# Patient Record
Sex: Female | Born: 1940
Health system: Southern US, Community
[De-identification: ages and names within clinical notes are randomized; demographics above are authoritative.]

## PROBLEM LIST (undated history)

## (undated) DIAGNOSIS — E669 Obesity, unspecified: Secondary | ICD-10-CM

## (undated) DIAGNOSIS — M797 Fibromyalgia: Secondary | ICD-10-CM

## (undated) DIAGNOSIS — Z9109 Other allergy status, other than to drugs and biological substances: Secondary | ICD-10-CM

## (undated) DIAGNOSIS — Z9981 Dependence on supplemental oxygen: Secondary | ICD-10-CM

## (undated) DIAGNOSIS — R5383 Other fatigue: Secondary | ICD-10-CM

## (undated) DIAGNOSIS — K219 Gastro-esophageal reflux disease without esophagitis: Secondary | ICD-10-CM

## (undated) DIAGNOSIS — I10 Essential (primary) hypertension: Secondary | ICD-10-CM

## (undated) DIAGNOSIS — F419 Anxiety disorder, unspecified: Secondary | ICD-10-CM

## (undated) DIAGNOSIS — I2510 Atherosclerotic heart disease of native coronary artery without angina pectoris: Secondary | ICD-10-CM

## (undated) DIAGNOSIS — H21269 Iris atrophy (essential) (progressive), unspecified eye: Secondary | ICD-10-CM

## (undated) DIAGNOSIS — I352 Nonrheumatic aortic (valve) stenosis with insufficiency: Secondary | ICD-10-CM

## (undated) DIAGNOSIS — G4733 Obstructive sleep apnea (adult) (pediatric): Secondary | ICD-10-CM

## (undated) DIAGNOSIS — H409 Unspecified glaucoma: Secondary | ICD-10-CM

## (undated) DIAGNOSIS — D509 Iron deficiency anemia, unspecified: Secondary | ICD-10-CM

## (undated) DIAGNOSIS — M1990 Unspecified osteoarthritis, unspecified site: Secondary | ICD-10-CM

## (undated) DIAGNOSIS — I5032 Chronic diastolic (congestive) heart failure: Secondary | ICD-10-CM

## (undated) DIAGNOSIS — Z780 Asymptomatic menopausal state: Secondary | ICD-10-CM

## (undated) DIAGNOSIS — E785 Hyperlipidemia, unspecified: Secondary | ICD-10-CM

## (undated) HISTORY — DX: Chronic diastolic (congestive) heart failure: I50.32

## (undated) HISTORY — DX: Anxiety disorder, unspecified: F41.9

## (undated) HISTORY — DX: Unspecified osteoarthritis, unspecified site: M19.90

## (undated) HISTORY — DX: Obstructive sleep apnea (adult) (pediatric): G47.33

## (undated) HISTORY — DX: Iris atrophy (essential) (progressive), unspecified eye: H21.269

## (undated) HISTORY — DX: Iron deficiency anemia, unspecified: D50.9

## (undated) HISTORY — DX: Fibromyalgia: M79.7

## (undated) HISTORY — DX: Essential (primary) hypertension: I10

## (undated) HISTORY — DX: Atherosclerotic heart disease of native coronary artery without angina pectoris: I25.10

## (undated) HISTORY — DX: Asymptomatic menopausal state: Z78.0

## (undated) HISTORY — DX: Other fatigue: R53.83

## (undated) HISTORY — DX: Gastro-esophageal reflux disease without esophagitis: K21.9

## (undated) HISTORY — DX: Other allergy status, other than to drugs and biological substances: Z91.09

## (undated) HISTORY — DX: Obesity, unspecified: E66.9

## (undated) HISTORY — DX: Hyperlipidemia, unspecified: E78.5

## (undated) HISTORY — DX: Nonrheumatic aortic (valve) stenosis with insufficiency: I35.2

## (undated) HISTORY — DX: Unspecified glaucoma: H40.9

---

## 1997-11-03 ENCOUNTER — Other Ambulatory Visit: Admission: RE | Admit: 1997-11-03 | Discharge: 1997-11-03 | Payer: Self-pay | Admitting: Family Medicine

## 1999-03-22 ENCOUNTER — Other Ambulatory Visit: Admission: RE | Admit: 1999-03-22 | Discharge: 1999-03-22 | Payer: Self-pay | Admitting: Family Medicine

## 2002-04-23 ENCOUNTER — Other Ambulatory Visit: Admission: RE | Admit: 2002-04-23 | Discharge: 2002-04-23 | Payer: Self-pay | Admitting: Family Medicine

## 2003-07-21 ENCOUNTER — Other Ambulatory Visit: Admission: RE | Admit: 2003-07-21 | Discharge: 2003-07-21 | Payer: Self-pay | Admitting: Family Medicine

## 2005-02-03 ENCOUNTER — Emergency Department (HOSPITAL_COMMUNITY): Admission: EM | Admit: 2005-02-03 | Discharge: 2005-02-03 | Payer: Self-pay | Admitting: Emergency Medicine

## 2005-12-14 ENCOUNTER — Encounter: Admission: RE | Admit: 2005-12-14 | Discharge: 2005-12-14 | Payer: Self-pay | Admitting: Orthopedic Surgery

## 2006-02-06 ENCOUNTER — Other Ambulatory Visit: Admission: RE | Admit: 2006-02-06 | Discharge: 2006-02-06 | Payer: Self-pay | Admitting: Diagnostic Radiology

## 2006-02-06 ENCOUNTER — Encounter: Admission: RE | Admit: 2006-02-06 | Discharge: 2006-02-06 | Payer: Self-pay | Admitting: Surgery

## 2006-02-06 ENCOUNTER — Encounter (INDEPENDENT_AMBULATORY_CARE_PROVIDER_SITE_OTHER): Payer: Self-pay | Admitting: *Deleted

## 2006-04-08 ENCOUNTER — Other Ambulatory Visit: Admission: RE | Admit: 2006-04-08 | Discharge: 2006-04-08 | Payer: Self-pay | Admitting: Family Medicine

## 2006-04-10 ENCOUNTER — Encounter: Admission: RE | Admit: 2006-04-10 | Discharge: 2006-04-10 | Payer: Self-pay | Admitting: Family Medicine

## 2008-05-12 ENCOUNTER — Inpatient Hospital Stay (HOSPITAL_COMMUNITY): Admission: EM | Admit: 2008-05-12 | Discharge: 2008-05-15 | Payer: Self-pay | Admitting: Emergency Medicine

## 2008-05-12 ENCOUNTER — Ambulatory Visit: Payer: Self-pay | Admitting: Cardiovascular Disease

## 2008-05-12 HISTORY — PX: CARDIAC CATHETERIZATION: SHX172

## 2008-05-13 ENCOUNTER — Encounter: Payer: Self-pay | Admitting: Cardiovascular Disease

## 2008-06-09 ENCOUNTER — Encounter: Payer: Self-pay | Admitting: Cardiovascular Disease

## 2008-06-09 ENCOUNTER — Other Ambulatory Visit: Admission: RE | Admit: 2008-06-09 | Discharge: 2008-06-09 | Payer: Self-pay | Admitting: Family Medicine

## 2008-06-18 ENCOUNTER — Encounter: Admission: RE | Admit: 2008-06-18 | Discharge: 2008-06-18 | Payer: Self-pay | Admitting: Family Medicine

## 2008-07-17 ENCOUNTER — Emergency Department (HOSPITAL_COMMUNITY): Admission: EM | Admit: 2008-07-17 | Discharge: 2008-07-17 | Payer: Self-pay | Admitting: Emergency Medicine

## 2008-08-04 ENCOUNTER — Ambulatory Visit (HOSPITAL_COMMUNITY): Admission: RE | Admit: 2008-08-04 | Discharge: 2008-08-04 | Payer: Self-pay | Admitting: Orthopedic Surgery

## 2009-08-01 ENCOUNTER — Inpatient Hospital Stay (HOSPITAL_COMMUNITY): Admission: EM | Admit: 2009-08-01 | Discharge: 2009-08-03 | Payer: Self-pay | Admitting: Emergency Medicine

## 2009-08-02 HISTORY — PX: CARDIAC CATHETERIZATION: SHX172

## 2009-12-09 ENCOUNTER — Ambulatory Visit: Payer: Self-pay | Admitting: Cardiovascular Disease

## 2009-12-09 ENCOUNTER — Inpatient Hospital Stay (HOSPITAL_COMMUNITY): Admission: EM | Admit: 2009-12-09 | Discharge: 2009-12-11 | Payer: Self-pay | Admitting: Emergency Medicine

## 2010-05-31 ENCOUNTER — Ambulatory Visit: Payer: Self-pay | Admitting: Cardiovascular Disease

## 2010-06-06 ENCOUNTER — Telehealth (INDEPENDENT_AMBULATORY_CARE_PROVIDER_SITE_OTHER): Payer: Self-pay | Admitting: Radiology

## 2010-06-07 ENCOUNTER — Ambulatory Visit: Admission: RE | Admit: 2010-06-07 | Discharge: 2010-06-07 | Payer: Self-pay | Source: Home / Self Care

## 2010-06-07 ENCOUNTER — Encounter (HOSPITAL_COMMUNITY)
Admission: RE | Admit: 2010-06-07 | Discharge: 2010-06-20 | Payer: Self-pay | Source: Home / Self Care | Attending: Cardiovascular Disease | Admitting: Cardiovascular Disease

## 2010-06-07 ENCOUNTER — Encounter: Payer: Self-pay | Admitting: Internal Medicine

## 2010-06-08 ENCOUNTER — Ambulatory Visit: Admission: RE | Admit: 2010-06-08 | Discharge: 2010-06-08 | Payer: Self-pay | Source: Home / Self Care

## 2010-06-21 ENCOUNTER — Ambulatory Visit (INDEPENDENT_AMBULATORY_CARE_PROVIDER_SITE_OTHER): Payer: Medicare Other | Admitting: Cardiovascular Disease

## 2010-06-21 DIAGNOSIS — I252 Old myocardial infarction: Secondary | ICD-10-CM

## 2010-06-21 DIAGNOSIS — I2510 Atherosclerotic heart disease of native coronary artery without angina pectoris: Secondary | ICD-10-CM

## 2010-06-21 DIAGNOSIS — I119 Hypertensive heart disease without heart failure: Secondary | ICD-10-CM

## 2010-06-21 DIAGNOSIS — Z9861 Coronary angioplasty status: Secondary | ICD-10-CM

## 2010-06-22 NOTE — Progress Notes (Signed)
Summary: nuc pre-procedure  Phone Note Outgoing Call   Call placed by: Domenic Polite, CNMT,  June 06, 2010 3:09 PM Call placed to: Patient Reason for Call: Confirm/change Appt Summary of Call: Left message with information on Myoview Information Sheet (see scanned document for details).      Nuclear Med Background Indications for Stress Test: Evaluation for Ischemia, Stent Patency   History: Echo, Heart Catheterization, Myocardial Infarction, Myocardial Perfusion Study, Stents  History Comments: '09 MI -Inf. / Stent  RCA; 7/10 MPS-nml.07/23/09 Cath patent stent-nml. LV FX.; 6/11 Echo -nml. LV FX  Symptoms: Chest Pressure, DOE, Fatigue    Nuclear Pre-Procedure Cardiac Risk Factors: Family History - CAD, History of Smoking, Hypertension, Lipids

## 2010-06-22 NOTE — Assessment & Plan Note (Signed)
Summary: Cardiology Nuclear Testing  Nuclear Med Background Indications for Stress Test: Evaluation for Ischemia, Stent Patency   History: Echo, Heart Catheterization, Myocardial Infarction, Myocardial Perfusion Study, Stents  History Comments: '09 IWMI>Stent-RCA; '10 ZOX:WRUEAV; 07/23/09 Cath:patent stent; 6/11 Echo:normal LVF  Symptoms: Chest Pain, DOE, Fatigue, Rapid HR  Symptoms Comments: CP>neck. Last episode of WU:JWJXBJYN, 06/03/10.   Nuclear Pre-Procedure Cardiac Risk Factors: Family History - CAD, History of Smoking, Hypertension, Lipids, Obesity Caffeine/Decaff Intake: None NPO After: 10:00 PM Lungs: Clear.  O2 Sat 97% on RA. IV 0.9% NS with Angio Cath: 20g     IV Site: R Antecubital IV Started by: Stanton Kidney, EMT-P Chest Size (in) 40     Cup Size D     Height (in): 66.5 Weight (lb): 227 BMI: 36.22 Tech Comments: Carvedilol held >12 hours, per patient.  Nuclear Med Study 1 or 2 day study:  2 day     Stress Test Type:  Eugenie Birks Reading MD:  Marca Ancona, MD     Referring MD:  Kristeen Miss, MD Resting Radionuclide:  Technetium 69m Tetrofosmin     Resting Radionuclide Dose:  33 mCi  Stress Radionuclide:  Technetium 65m Tetrofosmin     Stress Radionuclide Dose:  33 mCi   Stress Protocol   Lexiscan: 0.4 mg   Stress Test Technologist:  [employer], CMA-N     Nuclear Technologist:  Domenic Polite, CNMT  Rest Procedure  Myocardial perfusion imaging was performed at rest 45 minutes following the intravenous administration of Technetium 40m Tetrofosmin.  Stress Procedure  The patient received IV Lexiscan 0.4 mg over 15-seconds.  Technetium 72m Tetrofosmin injected at 30-seconds.  There were no significant changes with infusion, only occasional PVC's.  Quantitative spect images were obtained after a 45 minute delay.  QPS Raw Data Images:  Soft tissue (diaphragm, breast) surround heart. Stress Images:  Small region of apical thinning.  Otherwise normal  perfusion. Rest Images:  Normalization in apex. Subtraction (SDS):  No significant ischemia. Transient Ischemic Dilatation:  1.03  (Normal <1.22)  Lung/Heart Ratio:  0.36  (Normal <0.45)  Quantitative Gated Spect Images QGS EDV:  65 ml QGS ESV:  17 ml QGS EF:  73 %   Overall Impression  Exercise Capacity: Lexiscan with no exercise. BP Response: Normal blood pressure response. Clinical Symptoms: No chest pain ECG Impression: No significant ST segment change suggestive of ischemia. Overall Impression Comments: Probable normal perfusion and minimal apical thinning.  Appended Document: Cardiology Nuclear Testing copy sent to DR.Nasher

## 2010-06-23 ENCOUNTER — Inpatient Hospital Stay (HOSPITAL_BASED_OUTPATIENT_CLINIC_OR_DEPARTMENT_OTHER)
Admission: RE | Admit: 2010-06-23 | Discharge: 2010-06-23 | Disposition: A | Discharge status: Home / Self Care | Payer: Medicare Other | Source: Ambulatory Visit | Attending: Cardiovascular Disease | Admitting: Cardiovascular Disease

## 2010-06-23 DIAGNOSIS — Z9861 Coronary angioplasty status: Secondary | ICD-10-CM | POA: Insufficient documentation

## 2010-06-23 DIAGNOSIS — I2510 Atherosclerotic heart disease of native coronary artery without angina pectoris: Secondary | ICD-10-CM | POA: Insufficient documentation

## 2010-06-26 ENCOUNTER — Ambulatory Visit: Payer: Self-pay | Admitting: Cardiovascular Disease

## 2010-06-29 HISTORY — PX: CARDIAC CATHETERIZATION: SHX172

## 2010-07-02 NOTE — H&P (Signed)
NAMELAURIA, DEPOY NO.:  [id number]  MEDICAL RECORD NO.:  [id number]            PATIENT TYPE:  LOCATION:                                 FACILITY:  PHYSICIAN:  Vesta Mixer, M.D.      DATE OF BIRTH:  DATE OF ADMISSION: DATE OF DISCHARGE:                             HISTORY & PHYSICAL   [name] is a middle-aged female with a history of coronary artery disease, hypertension, hyperlipidemia.  She is admitted to the hospital for cardiac catheterization after presenting with some episodes of chest pain and having an abnormal stress Myoview study.  The patient presented to the office several weeks ago with some episodes of chest discomfort.  These episodes of chest discomfort were described as some fatigue and shortness of breath with exertion.  She has also had some palpitations.  She has had a pressure-like sensation that is relieved with sublingual nitroglycerin.  We sent her over for a stress Myoview study.  She was found to have a possible apical ischemia.  I cannot completely rule out breast attenuation, but it does appear to be a reversible defect.  Left ventricular systolic function was normal with an ejection fraction of 73%.  There are no segmental wall motion abnormalities.  The patient is now admitted for cardiac catheterization.  Current medications include: 1. Tramadol 50 mg three times a day. 2. Amlodipine 5 mg a day. 3. Plavix 75 mg a day. 4. Carvedilol 12.5 mg twice a day. 5. Effexor 75 mg a day. 6. Lisinopril 10 mg a day. 7. Aspirin 325 mg a day. 8. Mirapex once a day. 9. Crestor 10 mg a day. 10.Pramipexole 0.25 mg at night. 11.Hydrochlorothiazide 25 mg a day. 12.Iron tablets once a day. 13.Amitriptyline once a night.  ALLERGIES:  No known drug allergies.  PAST MEDICAL HISTORY: 1. Coronary artery disease - she is status post PTCA and stenting of     the distal right coronary artery using a 2.5- x 18-mm Xience stent  postdilated with 3-mm noncompliant balloon 2. Hypertension. 3. Hyperlipidemia. 4. Fatigue.  SOCIAL HISTORY:  The patient has a history of smoking in the remote past.  She quit in 1965.  FAMILY HISTORY:  Positive for hypertension, heart disease, and arthritis.  REVIEW OF SYSTEMS:  The review of systems as noted in the HPI.  All other systems are negative.  PHYSICAL EXAMINATION:  GENERAL:  She is alert and oriented x3, and her mood and affect are normal. VITAL SIGNS:  Her weight is 230 pounds, blood pressure is 118/60 with a heart rate of 62. HEENT:  Carotids 2+.  She has no bruits, no JVD, no thyromegaly. LUNGS:  Clear to auscultation. HEART:  Regular rate S1 and S2. ABDOMEN:  Good bowel sounds and is nontender. EXTREMITIES:  She has no clubbing, cyanosis, or edema. NEUROLOGIC:  Nonfocal.  Her EKG reveals normal sinus rhythm.  She has no ST or T-wave changes.  She presents with symptoms that are worrisome for angina.  Her stress Myoview study shows possible reversible anteroapical ischemia.  I have recommended that we proceed with  cardiac catheterization.  We have discussed the risks, benefits, and options of the cardiac catheterization.  She understands and agrees to proceed.  All of her other medical problems are stable.     Vesta Mixer, M.D.     PJN/MEDQ  D:  06/21/2010  T:  06/22/2010  Job:  [id number]  cc:   L. Lupe Carney, M.D.  Electronically Signed by Kristeen Miss M.D. on 06/30/2010 04:51:02 PM

## 2010-07-02 NOTE — Procedures (Signed)
NAMEYOUNIQUE, CASAD NO.:  [id number]  MEDICAL RECORD NO.:  [id number]          PATIENT TYPE:  LOCATION:                                 FACILITY:  PHYSICIAN:  Vesta Mixer, M.D.      DATE OF BIRTH:  DATE OF PROCEDURE:  06/23/2010 DATE OF DISCHARGE:                           CARDIAC CATHETERIZATION   [name] is a 70 year old female with a history of coronary artery disease.  She is status post PTCA and stenting of her distal right coronary artery several years ago.  She presents with episodes of chest discomfort and shortness of breath with exertion.  She had a stress Myoview study which revealed apical ischemia.  We were not able to rule out breast attenuation.  The patient is getting in for heart catheterization and for further evaluation.  PROCEDURE:  Left heart catheterization with coronary angiography.  Right femoral artery was easily cannulated using modified Seldinger technique.  HEMODYNAMIC RESULTS:  LV pressure is 116/11.  The aortic pressure is 112/40.  Angiography:  Left main:  The left main is relatively short, but is otherwise normal.  The left main and LAD have mild to moderate calcifications.  Overall, the coronary arteries are quite small.  The left anterior descending artery has a proximal narrowing of approximately 40-50%.  It is probably between 1.3 and 2 mm in diameter. There is no discrete stenosis.  This is followed by an eccentric 30-40% stenosis in the proximal LAD. The first diagonal branch is a moderate-sized branch.  There are minor luminal irregularities in the first diagonal artery.  There are no discrete stenoses.  The mid and distal LAD are somewhat tortuous, but otherwise free of disease.  The left circumflex artery has mild to moderate irregularities.  It gives off primarily a first obtuse marginal artery.  This branch is tortuous, but otherwise is free of disease.  The right coronary artery is large and  dominant.  There are mild to moderate irregularities.  The mid RCA has a long diffuse 50% stenosis. There is then a 30% stenosis prior to the bifurcation.  The stent located in the distal RCA is wide open.  There is no evidence of in- stent restenosis.  The posterior descending artery and posterolateral segment artery are normal.  The left ventriculogram was performed in a 30 RAO position.  It reveals normal left ventricular systolic function.  The ejection fraction is 65%.  There is no significant mitral regurgitation.  COMPLICATIONS:  None.  CONCLUSION:  Mild to moderate coronary artery irregularities.  Her proximal left anterior descending artery is moderately narrowed.  She also has an eccentric stenosis in the proximal LAD.  These do not appear to obstruct flow at present, but they are fairly small vessels.  They appeared to be between 1.5 and 2 mm in diameter based on the fact that we are using 4-French catheters.  We will continue to treat her medically.  She needs to lose weight.  We will maximize her medical therapy.  Of note, the patient did have some apneic episodes.  She quite likely has sleep apnea and  will need to evaluate this in future.     Vesta Mixer, M.D.     PJN/MEDQ  D:  06/23/2010  T:  06/24/2010  Job:  [id number]  Electronically Signed by Kristeen Miss M.D. on 06/30/2010 04:50:59 PM

## 2010-07-10 ENCOUNTER — Ambulatory Visit: Payer: Medicare Other | Admitting: Cardiovascular Disease

## 2010-08-05 LAB — CARDIAC PANEL(CRET KIN+CKTOT+MB+TROPI)
CK, MB: 1.4 ng/mL (ref 0.3–4.0)
Relative Index: INVALID (ref 0.0–2.5)
Total CK: 68 U/L (ref 7–177)
Troponin I: 0.03 ng/mL (ref 0.00–0.06)

## 2010-08-05 LAB — URINALYSIS, ROUTINE W REFLEX MICROSCOPIC
Glucose, UA: NEGATIVE mg/dL
Nitrite: NEGATIVE
Protein, ur: NEGATIVE mg/dL
Specific Gravity, Urine: 1.015 (ref 1.005–1.030)
Urobilinogen, UA: 0.2 mg/dL (ref 0.0–1.0)

## 2010-08-05 LAB — BASIC METABOLIC PANEL
BUN: 24 mg/dL — ABNORMAL HIGH (ref 6–23)
CO2: 27 mEq/L (ref 19–32)
GFR calc non Af Amer: 55 mL/min — ABNORMAL LOW (ref >60)
Potassium: 4.2 mEq/L (ref 3.5–5.1)

## 2010-08-05 LAB — CBC
Hemoglobin: 11.5 g/dL — ABNORMAL LOW (ref 12.0–15.0)
Platelets: 201 10*3/uL (ref 150–400)
RBC: 3.91 MIL/uL (ref 3.87–5.11)
RDW: 16.2 % — ABNORMAL HIGH (ref 11.5–15.5)
WBC: 9.4 10*3/uL (ref 4.0–10.5)

## 2010-08-05 LAB — DIFFERENTIAL
Eosinophils Absolute: 0.2 10*3/uL (ref 0.0–0.7)
Monocytes Absolute: 0.7 10*3/uL (ref 0.1–1.0)
Monocytes Relative: 8 % (ref 3–12)

## 2010-08-05 LAB — APTT: aPTT: 24 seconds (ref 24–37)

## 2010-08-05 LAB — PROTIME-INR
INR: 0.94 (ref 0.00–1.49)
Prothrombin Time: 12.5 seconds (ref 11.6–15.2)

## 2010-08-05 LAB — URINE MICROSCOPIC-ADD ON

## 2010-08-05 LAB — POCT CARDIAC MARKERS
CKMB, poc: 1.1 ng/mL (ref 1.0–8.0)
Myoglobin, poc: 84.9 ng/mL (ref 12–200)
Troponin i, poc: <0.05 ng/mL (ref 0.00–0.09)

## 2010-08-05 LAB — CK TOTAL AND CKMB (NOT AT ARMC): Total CK: 68 U/L (ref 7–177)

## 2010-08-14 ENCOUNTER — Other Ambulatory Visit: Payer: Self-pay | Admitting: *Deleted

## 2010-08-14 DIAGNOSIS — I10 Essential (primary) hypertension: Secondary | ICD-10-CM

## 2010-08-14 LAB — POCT I-STAT, CHEM 8
BUN: 19 mg/dL (ref 6–23)
Chloride: 108 mEq/L (ref 96–112)
Glucose, Bld: 84 mg/dL (ref 70–99)
HCT: 31 % — ABNORMAL LOW (ref 36.0–46.0)
Potassium: 3.8 mEq/L (ref 3.5–5.1)

## 2010-08-14 LAB — COMPREHENSIVE METABOLIC PANEL
ALT: 17 U/L (ref 0–35)
AST: 25 U/L (ref 0–37)
Albumin: 3.3 g/dL — ABNORMAL LOW (ref 3.5–5.2)
Alkaline Phosphatase: 87 U/L (ref 39–117)
Chloride: 107 mEq/L (ref 96–112)
GFR calc Af Amer: >60 mL/min (ref >60)
Potassium: 4.1 mEq/L (ref 3.5–5.1)
Total Bilirubin: 0.6 mg/dL (ref 0.3–1.2)

## 2010-08-14 LAB — DIFFERENTIAL
Basophils Absolute: 0 10*3/uL (ref 0.0–0.1)
Eosinophils Relative: 2 % (ref 0–5)
Lymphocytes Relative: 32 % (ref 12–46)
Neutro Abs: 4.1 10*3/uL (ref 1.7–7.7)

## 2010-08-14 LAB — RETICULOCYTES
RBC.: 4.01 MIL/uL (ref 3.87–5.11)
Retic Ct Pct: 0.9 % (ref 0.4–3.1)

## 2010-08-14 LAB — CARDIAC PANEL(CRET KIN+CKTOT+MB+TROPI)
CK, MB: 0.8 ng/mL (ref 0.3–4.0)
CK, MB: 0.9 ng/mL (ref 0.3–4.0)
Total CK: 80 U/L (ref 7–177)

## 2010-08-14 LAB — CBC
HCT: 30.8 % — ABNORMAL LOW (ref 36.0–46.0)
MCHC: 33.5 g/dL (ref 30.0–36.0)
Platelets: 180 10*3/uL (ref 150–400)
Platelets: 210 10*3/uL (ref 150–400)
RBC: 3.74 MIL/uL — ABNORMAL LOW (ref 3.87–5.11)
RDW: 17.8 % — ABNORMAL HIGH (ref 11.5–15.5)
WBC: 7 10*3/uL (ref 4.0–10.5)

## 2010-08-14 LAB — FOLATE: Folate: >20 ng/mL

## 2010-08-14 LAB — LIPID PANEL
Cholesterol: 192 mg/dL (ref 0–200)
LDL Cholesterol: 128 mg/dL — ABNORMAL HIGH (ref 0–99)
VLDL: 18 mg/dL (ref 0–40)

## 2010-08-14 LAB — IRON AND TIBC
Saturation Ratios: 12 % — ABNORMAL LOW (ref 20–55)
UIBC: 335 ug/dL

## 2010-08-14 LAB — CK TOTAL AND CKMB (NOT AT ARMC): Total CK: 89 U/L (ref 7–177)

## 2010-08-14 LAB — POCT CARDIAC MARKERS
CKMB, poc: <1 ng/mL — ABNORMAL LOW (ref 1.0–8.0)
Troponin i, poc: <0.05 ng/mL (ref 0.00–0.09)

## 2010-08-14 LAB — VITAMIN B12: Vitamin B-12: 622 pg/mL (ref 211–911)

## 2010-08-14 LAB — PROTIME-INR: Prothrombin Time: 12.4 seconds (ref 11.6–15.2)

## 2010-08-14 LAB — APTT: aPTT: 25 seconds (ref 24–37)

## 2010-08-14 MED ORDER — AMLODIPINE BESYLATE 5 MG PO TABS: 5.0000 mg | ORAL_TABLET | Freq: Every day | ORAL | Status: DC

## 2010-08-14 NOTE — Telephone Encounter (Signed)
REFILL PER FAX FROM PHARMACY  

## 2010-08-16 ENCOUNTER — Other Ambulatory Visit: Payer: Self-pay | Admitting: *Deleted

## 2010-08-16 DIAGNOSIS — I2510 Atherosclerotic heart disease of native coronary artery without angina pectoris: Secondary | ICD-10-CM

## 2010-08-16 MED ORDER — CARVEDILOL 12.5 MG PO TABS: 12.5000 mg | ORAL_TABLET | Freq: Two times a day (BID) | ORAL | Status: DC

## 2010-08-16 NOTE — Telephone Encounter (Signed)
Refill per fax from pharmacy

## 2010-08-31 LAB — CBC
HCT: 31.7 % — ABNORMAL LOW (ref 36.0–46.0)
Hemoglobin: 10.8 g/dL — ABNORMAL LOW (ref 12.0–15.0)
MCHC: 34 g/dL (ref 30.0–36.0)
MCV: 86.2 fL (ref 78.0–100.0)
Platelets: 206 10*3/uL (ref 150–400)
RBC: 3.68 MIL/uL — ABNORMAL LOW (ref 3.87–5.11)
RDW: 14.2 % (ref 11.5–15.5)
WBC: 6.8 10*3/uL (ref 4.0–10.5)

## 2010-08-31 LAB — BASIC METABOLIC PANEL
BUN: 19 mg/dL (ref 6–23)
CO2: 29 mEq/L (ref 19–32)
Calcium: 9.9 mg/dL (ref 8.4–10.5)
Chloride: 104 mEq/L (ref 96–112)
Creatinine, Ser: 1.02 mg/dL (ref 0.4–1.2)
GFR calc Af Amer: >60 mL/min (ref >60)
GFR calc non Af Amer: 54 mL/min — ABNORMAL LOW (ref >60)
Glucose, Bld: 84 mg/dL (ref 70–99)
Potassium: 4.5 mEq/L (ref 3.5–5.1)
Sodium: 139 mEq/L (ref 135–145)

## 2010-09-13 ENCOUNTER — Telehealth: Payer: Self-pay | Admitting: Cardiovascular Disease

## 2010-09-13 NOTE — Telephone Encounter (Signed)
Please Call back

## 2010-09-13 NOTE — Telephone Encounter (Signed)
Been having fits of dizziness and extreme drops in blood pressure when she stands. She feels like she is on the verge of blacking out when she stands. Feels tingling sensations in her feet and hands. This has been getting progressively worse over the past three months. Would like to speak with someone about this. Please call back. I have pulled her file.

## 2010-09-13 NOTE — Telephone Encounter (Signed)
Been seen by pcp and ER for total of three times. bp been110/70.  Worsening near syncopal episodes. Wants to be seen, app made.Alfonso Ramus RN

## 2010-09-14 ENCOUNTER — Encounter: Payer: Self-pay | Admitting: *Deleted

## 2010-09-14 DIAGNOSIS — I252 Old myocardial infarction: Secondary | ICD-10-CM | POA: Insufficient documentation

## 2010-09-14 DIAGNOSIS — I10 Essential (primary) hypertension: Secondary | ICD-10-CM

## 2010-09-14 DIAGNOSIS — E785 Hyperlipidemia, unspecified: Secondary | ICD-10-CM

## 2010-09-14 DIAGNOSIS — I2510 Atherosclerotic heart disease of native coronary artery without angina pectoris: Secondary | ICD-10-CM | POA: Insufficient documentation

## 2010-09-14 DIAGNOSIS — R5383 Other fatigue: Secondary | ICD-10-CM

## 2010-09-15 ENCOUNTER — Encounter: Payer: Self-pay | Admitting: Nurse Practitioner

## 2010-09-15 ENCOUNTER — Ambulatory Visit (INDEPENDENT_AMBULATORY_CARE_PROVIDER_SITE_OTHER): Payer: Medicare Other | Admitting: Nurse Practitioner

## 2010-09-15 ENCOUNTER — Ambulatory Visit: Payer: Medicare Other | Admitting: Nurse Practitioner

## 2010-09-15 ENCOUNTER — Other Ambulatory Visit: Payer: Self-pay | Admitting: *Deleted

## 2010-09-15 ENCOUNTER — Ambulatory Visit
Admission: RE | Admit: 2010-09-15 | Discharge: 2010-09-15 | Disposition: A | Discharge status: Home / Self Care | Payer: Medicare Other | Source: Ambulatory Visit | Attending: Nurse Practitioner | Admitting: Nurse Practitioner

## 2010-09-15 VITALS — BP 100/60 | HR 72 | Ht 66.0 in | Wt 229.0 lb

## 2010-09-15 DIAGNOSIS — R319 Hematuria, unspecified: Secondary | ICD-10-CM | POA: Insufficient documentation

## 2010-09-15 DIAGNOSIS — I951 Orthostatic hypotension: Secondary | ICD-10-CM

## 2010-09-15 DIAGNOSIS — N390 Urinary tract infection, site not specified: Secondary | ICD-10-CM

## 2010-09-15 DIAGNOSIS — I2510 Atherosclerotic heart disease of native coronary artery without angina pectoris: Secondary | ICD-10-CM

## 2010-09-15 DIAGNOSIS — I959 Hypotension, unspecified: Secondary | ICD-10-CM

## 2010-09-15 DIAGNOSIS — R42 Dizziness and giddiness: Secondary | ICD-10-CM

## 2010-09-15 LAB — URINALYSIS, ROUTINE W REFLEX MICROSCOPIC
Nitrite: POSITIVE
Specific Gravity, Urine: 1.025 (ref 1.000–1.030)
Total Protein, Urine: 100
Urine Glucose: NEGATIVE
Urobilinogen, UA: 0.2 (ref 0.0–1.0)
pH: 5.5 (ref 5.0–8.0)

## 2010-09-15 MED ORDER — IOHEXOL 300 MG/ML  SOLN: 100.0000 mL | Freq: Once | INTRAMUSCULAR | Status: AC | PRN

## 2010-09-15 MED ORDER — LISINOPRIL 20 MG PO TABS: 10.0000 mg | ORAL_TABLET | Freq: Every day | ORAL | Status: DC

## 2010-09-15 MED ORDER — SULFAMETHOXAZOLE-TRIMETHOPRIM 800-160 MG PO TABS: 1.0000 | ORAL_TABLET | Freq: Two times a day (BID) | ORAL | Status: AC

## 2010-09-15 NOTE — Telephone Encounter (Signed)
Patient called with lab results. Pt verbalized understanding. ABX called to pharmacy. Alfonso Ramus RN

## 2010-09-15 NOTE — Assessment & Plan Note (Addendum)
Blood pressure remains low. I have cut her Lisinopril back to just half a tablet. She will try to monitor as an outpatient. We will check a BMET on return. I will see her back in one week. She is to remain off the HCTZ as well.

## 2010-09-15 NOTE — Assessment & Plan Note (Signed)
She is not having chest pain. She has had recent cath just 2 months ago. We will continue with her current regimen.

## 2010-09-15 NOTE — Patient Instructions (Addendum)
I am going to get copies of your lab work. I am going to send you for a CT scan of your head, looking for other causes for your dizziness. Cut the Lisinopril back to just half a tablet Try to check your blood pressure over the next week. We will check a urinalysis today. It would be ok to try some Meclizine 25 mg up to 3 x a day. This is over the counter.

## 2010-09-15 NOTE — Assessment & Plan Note (Signed)
This has been going on for at least 3 months. We are going to check a CT of her head. She may try some meclizine prn.

## 2010-09-15 NOTE — Progress Notes (Signed)
[name] Date of Birth: May 06, 1941   History of Present Illness: [name] is seen today for a work in visit. She is seen for Dr. Elease Hashimoto. She is complaining of dizziness. She has basically not felt well for at least the last 3 months. She says she "just feels bad". She is dizzy and feeling like she is going to pass out.  She has not had frank syncope. She denies chest pain or palpitations. She has chronic DOE that seems to be unchanged. She has seen multiple doctors and had lots of lab work checked. Her TSH is low and she is not on therapy. She notes that the right side of her face has felt "wierd". She has had what she describes as a chronic cold. She has had vertigo in the past, but this feels different. She has had no other neurologic complaints. She was taken off of her HCTZ due to low blood pressure.   Current Outpatient Prescriptions on File Prior to Visit  Medication Sig Dispense Refill  . acetaminophen (TYLENOL) 500 MG tablet Take 500 mg by mouth as needed.       Marland Kitchen AMITRIPTYLINE HCL PO Take 1 tablet by mouth at bedtime.        Marland Kitchen amLODipine (NORVASC) 5 MG tablet Take 1 tablet (5 mg total) by mouth daily.  30 tablet  11  . aspirin 325 MG tablet Take 325 mg by mouth daily.        . carvedilol (COREG) 12.5 MG tablet Take 1 tablet (12.5 mg total) by mouth 2 (two) times daily.  60 tablet  11  . clopidogrel (PLAVIX) 75 MG tablet Take 75 mg by mouth daily.        . diclofenac sodium (VOLTAREN) 1 % GEL Apply topically. **CREAM** use as directed       . pantoprazole (PROTONIX) 40 MG tablet Take 40 mg by mouth daily.        . pramipexole (MIRAPEX) 0.25 MG tablet Take 1 mg by mouth at bedtime.       . rosuvastatin (CRESTOR) 10 MG tablet Take 10 mg by mouth daily.        . traMADol (ULTRAM) 50 MG tablet Take 50 mg by mouth 2 (two) times daily.       Marland Kitchen DISCONTD: lisinopril (PRINIVIL,ZESTRIL) 20 MG tablet Take 20 mg by mouth daily.        . hydrochlorothiazide 25 MG tablet Take 25 mg by mouth  daily.        . IRON PO Take 1 tablet by mouth daily.        . nitroGLYCERIN (NITROSTAT) 0.4 MG SL tablet Place 0.4 mg under the tongue every 5 (five) minutes as needed.        . venlafaxine (EFFEXOR) 75 MG tablet Take 75 mg by mouth. 1/2 tablet 2 (two) times daily         Allergies  Allergen Reactions  . Codeine   . Cortisone     Past Medical History  Diagnosis Date  . Hypertension   . Hyperlipidemia   . History of myocardial infarction 2009  . Fatigue   . History of chest pain   . Anxiety   . Coronary artery disease     stenting of the distal right coronary artery-2.75 x 18 Xience stent postdilated with a 3mm noncomplicated balloon   . Shortness of breath     Past Surgical History  Procedure Date  . Cardiac catheterization 06/29/2010  Mild to moderate coronary artery irregularities.  Her  proximal left anterior descending artery is moderately narrowed.  She  also has an eccentric stenosis in the proximal LAD.  These do not appear  to obstruct flow at present, but they are fairly small vessels.  They  appeared to be between 1.5 and 2 mm in diamete  . Cardiac catheterization 08/02/2009    Patent stent  . Cardiac catheterization 05/12/2008    Stent to the distal RCA    History  Smoking status  . Former Smoker  . Quit date: 09/13/1960  Smokeless tobacco  . Never Used    History  Alcohol Use No    Family History  Problem Relation Age of Onset  . Heart attack Mother   . Coronary artery disease Brother     had CABG  . Coronary artery disease Brother   . Coronary artery disease Brother   . Coronary artery disease Brother   . Coronary artery disease Brother   . Heart attack Brother     Review of Systems: The review of systems is positive for dizziness, presyncope and generalized fatigue. She is not having chest pain. Blood pressure has been low. She is not able to check it at home.  All other systems were reviewed and are negative.  Physical Exam: BP 100/60   Pulse 72  Ht 5\' 6"  (1.676 m)  Wt 229 lb (103.874 kg)  BMI 36.96 kg/m2 Patient is very pleasant and in no acute distress. She is obese. Skin is warm and dry. Color is normal.  HEENT is unremarkable. Normocephalic/atraumatic. PERRL. Sclera are nonicteric. Neck is supple. No masses. No JVD. Lungs are clear. Cardiac exam shows a regular rate and rhythm. She has an occasional ectopic. Abdomen is soft and obese. Extremities are without edema. Gait and ROM are intact. No gross neurologic deficits noted.  LABORATORY DATA: EKG shows sinus with a PVC. Tracing is unchanged. Labs drawn recently are reviewed and show a potassium of 5.6. BUN and creatinine are 34 and 1.5. TSH is low at 0.14.   Assessment / Plan:

## 2010-09-15 NOTE — Assessment & Plan Note (Signed)
We will check a urinalysis today. She is not allergic to any antibiotics.

## 2010-09-22 ENCOUNTER — Ambulatory Visit (INDEPENDENT_AMBULATORY_CARE_PROVIDER_SITE_OTHER): Payer: Medicare Other | Admitting: Nurse Practitioner

## 2010-09-22 ENCOUNTER — Encounter: Payer: Self-pay | Admitting: Nurse Practitioner

## 2010-09-22 VITALS — BP 122/62 | HR 70 | Ht 66.0 in | Wt 231.0 lb

## 2010-09-22 DIAGNOSIS — N390 Urinary tract infection, site not specified: Secondary | ICD-10-CM

## 2010-09-22 DIAGNOSIS — R319 Hematuria, unspecified: Secondary | ICD-10-CM

## 2010-09-22 DIAGNOSIS — I10 Essential (primary) hypertension: Secondary | ICD-10-CM

## 2010-09-22 DIAGNOSIS — R42 Dizziness and giddiness: Secondary | ICD-10-CM

## 2010-09-22 DIAGNOSIS — I959 Hypotension, unspecified: Secondary | ICD-10-CM

## 2010-09-22 LAB — BASIC METABOLIC PANEL
BUN: 23 mg/dL (ref 6–23)
CO2: 25 mEq/L (ref 19–32)
Calcium: 9.4 mg/dL (ref 8.4–10.5)
Chloride: 103 mEq/L (ref 96–112)
Creatinine, Ser: 1.5 mg/dL — ABNORMAL HIGH (ref 0.4–1.2)
GFR: 36.94 mL/min — ABNORMAL LOW (ref >60.00)
Glucose, Bld: 80 mg/dL (ref 70–99)
Potassium: 4.7 mEq/L (ref 3.5–5.1)
Sodium: 137 mEq/L (ref 135–145)

## 2010-09-22 LAB — URINALYSIS
Bilirubin Urine: NEGATIVE
Hgb urine dipstick: NEGATIVE
Ketones, ur: NEGATIVE
Leukocytes, UA: NEGATIVE
Nitrite: NEGATIVE
Specific Gravity, Urine: 1.025 (ref 1.000–1.030)
Total Protein, Urine: NEGATIVE
Urine Glucose: NEGATIVE
Urobilinogen, UA: 0.2 (ref 0.0–1.0)
pH: 6 (ref 5.0–8.0)

## 2010-09-22 NOTE — Assessment & Plan Note (Signed)
Her dizziness is resolved. CT of the head showed no acute abnormality.

## 2010-09-22 NOTE — Progress Notes (Signed)
[name] Date of Birth: [birth year]   History of Present Illness: [name] is seen today for a follow up appointment. It is a one week check. She is seen for Dr. Elease Hashimoto. She was here last week with a multitude of complaints. She did not feel well. She had been dizzy for several months. She had a UTI. Blood pressure was low. We treated her UTI and cut her Lisinopril back. She had already stopped her HCTZ. She did have a CT of the head which was satisfactory. She had age related changes. She feels much better. She is back in her water aerobics class. She is not able to check her blood pressure at home.   Current Outpatient Prescriptions on File Prior to Visit  Medication Sig Dispense Refill  . AMITRIPTYLINE HCL PO Take 1 tablet by mouth at bedtime.        Marland Kitchen amLODipine (NORVASC) 5 MG tablet Take 1 tablet (5 mg total) by mouth daily.  30 tablet  11  . aspirin 325 MG tablet Take 325 mg by mouth daily.        . carvedilol (COREG) 12.5 MG tablet Take 1 tablet (12.5 mg total) by mouth 2 (two) times daily.  60 tablet  11  . clopidogrel (PLAVIX) 75 MG tablet Take 75 mg by mouth daily.        . diclofenac sodium (VOLTAREN) 1 % GEL Apply topically. **CREAM** use as directed       . IRON PO Take 1 tablet by mouth daily.        Marland Kitchen lisinopril (PRINIVIL,ZESTRIL) 20 MG tablet Take 0.5 tablets (10 mg total) by mouth daily.      . nitroGLYCERIN (NITROSTAT) 0.4 MG SL tablet Place 0.4 mg under the tongue every 5 (five) minutes as needed.        . pantoprazole (PROTONIX) 40 MG tablet Take 40 mg by mouth daily.        . pramipexole (MIRAPEX) 0.25 MG tablet Take 1 mg by mouth at bedtime.       . rosuvastatin (CRESTOR) 10 MG tablet Take 10 mg by mouth daily.        Marland Kitchen sulfamethoxazole-trimethoprim (SEPTRA DS) 800-160 MG per tablet Take 1 tablet by mouth 2 (two) times daily.  14 tablet  0  . traMADol (ULTRAM) 50 MG tablet Take 50 mg by mouth 2 (two) times daily.       Marland Kitchen venlafaxine (EFFEXOR) 75 MG tablet Take  75 mg by mouth. 1/2 tablet 2 (two) times daily       . acetaminophen (TYLENOL) 500 MG tablet Take 500 mg by mouth as needed.       . hydrochlorothiazide 25 MG tablet Take 12.5 mg by mouth daily.         Allergies  Allergen Reactions  . Codeine   . Cortisone     Past Medical History  Diagnosis Date  . Hypertension   . Hyperlipidemia   . History of myocardial infarction 2009  . Fatigue   . History of chest pain   . Anxiety   . Coronary artery disease     stenting of the distal right coronary artery-2.75 x 18 Xience stent postdilated with a 3mm noncomplicated balloon   . Shortness of breath     Past Surgical History  Procedure Date  . Cardiac catheterization 06/29/2010    Mild to moderate coronary artery irregularities.  Her  proximal left anterior descending artery is moderately narrowed.  She  also has an eccentric stenosis in the proximal LAD.  These do not appear  to obstruct flow at present, but they are fairly small vessels.  They  appeared to be between 1.5 and 2 mm in diamete  . Cardiac catheterization 08/02/2009    Patent stent  . Cardiac catheterization 05/12/2008    Stent to the distal RCA    History  Smoking status  . Former Smoker  . Quit date: 09/13/1960  Smokeless tobacco  . Never Used    History  Alcohol Use No    Family History  Problem Relation Age of Onset  . Heart attack Mother   . Coronary artery disease Brother     had CABG  . Coronary artery disease Brother   . Coronary artery disease Brother   . Coronary artery disease Brother   . Coronary artery disease Brother   . Heart attack Brother     Review of Systems: The review of systems is as above.   All other systems were reviewed and are negative.  Physical Exam: BP 122/62  Pulse 70  Ht 5\' 6"  (1.676 m)  Wt 231 lb (104.781 kg)  BMI 37.28 kg/m2 Patient is very pleasant and in no acute distress. She is obese.  Skin is warm and dry. Color is normal.  HEENT is unremarkable.  Normocephalic/atraumatic. PERRL. Sclera are nonicteric. Neck is supple. No masses. No JVD. Lungs are clear. Cardiac exam shows a regular rate and rhythm. She does have occasional ectopics. Abdomen is soft and obese. Extremities are without edema. Gait and ROM are intact. No gross neurologic deficits noted.  LABORATORY DATA:  BMET and U/A pending   Assessment / Plan:

## 2010-09-22 NOTE — Assessment & Plan Note (Signed)
She was treated for a UTI. No culture was obtained due to an error with the lab ordering. We will recheck today.

## 2010-09-22 NOTE — Patient Instructions (Addendum)
Stay off the HCTZ  Stay on the lower dose of Lisinopril Keep your regular appointment with Dr. Elease Hashimoto We will recheck a urinalysis today.

## 2010-09-22 NOTE — Assessment & Plan Note (Signed)
Blood pressure is ok today. She will try to get a cuff and start monitoring at home. I encouraged her to exercise.

## 2010-09-25 ENCOUNTER — Telehealth: Payer: Self-pay | Admitting: Cardiovascular Disease

## 2010-09-25 NOTE — Telephone Encounter (Signed)
Reviewed lab results with patient verbalizes understanding to increase water intake as directed by Norma Fredrickson.

## 2010-09-26 ENCOUNTER — Telehealth: Payer: Self-pay | Admitting: *Deleted

## 2010-09-26 NOTE — Telephone Encounter (Signed)
msg left with normal/stable results and to increase water. Office number left in msg to call with questions or concerns.Alfonso Ramus RN

## 2010-09-26 NOTE — Telephone Encounter (Signed)
Message copied by Mahalia Longest on Tue Sep 26, 2010  5:13 PM ------      Message from: Norma Fredrickson      Created: Fri Sep 22, 2010  4:09 PM       Ok to report. Labs are satisfactory. Urine is clear. Increase water.

## 2010-10-03 NOTE — Discharge Summary (Signed)
[name], [name] NO.:  [id number]   MEDICAL RECORD NO.:  [id number]          PATIENT TYPE:  INP   LOCATION:  3739                         FACILITY:  [facility]   PHYSICIAN:  Vesta Mixer, M.D. DATE OF BIRTH:  Aug 26, 1940   DATE OF ADMISSION:  05/12/2008  DATE OF DISCHARGE:  05/15/2008                               DISCHARGE SUMMARY   DISCHARGE DIAGNOSES:  1. Acute inferior wall myocardial infarction - status post      percutaneous transluminal coronary angioplasty and stenting of the      right coronary artery.  2. History of hypertension.  3. History of hyperlipidemia.  4. History of fibromyalgia.  5. History of arthritis.   DISCHARGE MEDICATIONS:  1. Aspirin 325 mg a day.  2. Plavix 75 mg a day.  3. Lipitor 80 mg a day.  4. Benicar 40 mg a day.  5. Effexor 75 mg a day.  6. Norvasc 5 mg a day.  7. Coreg 12.5 mg twice a day.  8. Nitroglycerin 0.4 mg sublingually as needed.   The patient has been instructed to stop her Voltaren.  She may continue  her Ultram.   DISPOSITION:  The patient will see Dr. Elease Hashimoto in 1-2 weeks.  She is to  see her medical doctor for further problems.   HISTORY:  Ms. Domangue is a 70 year old female who was admitted by the  Cardiology fellow with acute onset of chest pain.  She was found to have  ST-segment elevation in the inferior leads, consistent with an inferior  wall myocardial infarction.  She was taken emergently to the cath lab  for further evaluation.   HOSPITAL COURSE PER PROBLEMS:  1. Inferior wall myocardial infarction:  The patient had heart      catheterization.  She was found to have mild-to-moderate disease in      the left anterior descending artery.  She had an approximately 30-      40% stenosis at the origin.  There was also a mid 50% LAD stenosis.      The left circumflex artery had minor luminal irregularities.  The      right coronary artery had a 50% mid stenosis.  The posterolateral      branch was  occluded.  She had hyperdynamic left ventricular      systolic function with an ejection fraction of 65-70%.  There was      inferior basilar akinesis.   She underwent successful PTCA and stenting of the right coronary artery  using a XIENCE 2.75 x 18 mm stent.  It was deployed at 12 atmospheres  for 36 seconds.  Post stent dilatation was achieved using a 3.0 x 15 mm  Voyager [place].  She tolerated the procedure quite well and did not have any  further pain or problems.  She recovered quite nicely and was discharged  home in satisfactory condition.  All of her other medical problems were  stable.      Vesta Mixer, M.D.  Electronically Signed     PJN/MEDQ  D:  06/18/2008  T:  06/18/2008  Job:  [id number]

## 2010-10-03 NOTE — Cardiovascular Report (Signed)
NAMENARDOS, PUTNAM NO.:  [id number]   MEDICAL RECORD NO.:  [id number]          PATIENT TYPE:  INP   LOCATION:  2908                         FACILITY:  [facility]   PHYSICIAN:  Vesta Mixer, M.D. DATE OF BIRTH:  1940-08-10   DATE OF PROCEDURE:  05/12/2008  DATE OF DISCHARGE:                            CARDIAC CATHETERIZATION   [name] is a 70 year old female with a history of hypertension and  dyslipidemia.  She started having episodes of chest pain tonight.  Please see dictated H and P by Dr. Lalla Brothers.  She was brought emergently  to the cath lab for an acute ST-segment elevation inferior wall  myocardial infarction.  She had been having chest pain for approximately  45 minutes when she arrived in the ER.   PROCEDURES:  Left heart catheterization with coronary angiography.  We  also performed percutaneous transluminal coronary angioplasty and  stenting of the right coronary artery.   The right femoral artery was easily cannulated using a modified  Seldinger technique.   HEMODYNAMICS:  LV pressure is 180/26 with a LVEDP of 39.  Her aortic  pressure was 176/79.   Angiography of the left main:  The left main is fairly short.  There is  no stenosis.   The left anterior descending artery has a 30-40% stenosis at its origin.  This was followed by 50% mid stenosis.  The distal LAD is unremarkable.   The first diagonal artery is a fairly large vessel.  It has a 20-30%  stenosis at its takeoff.   The left circumflex artery is a moderate-sized vessel.  There are  minimal irregularities.  The obtuse marginal artery and the distal  circumflex artery have only minor luminal irregularities.   Right coronary artery is large and is dominant.  There is a minor  luminal irregularities in the proximal segment.  There is a 50% mid  stenosis.  The posterior descending artery has minor luminal  irregularities and is otherwise normal.  The posterolateral branch is  occluded.   The left ventriculogram was actually performed at the end of case.  It  revealed a relatively hyperdynamic left ventricle.  There was  inferobasilar akinesis.  The ejection fraction is around 65-70%.   PTCA, a Judkins right 4 side-hole guide was used to cannulate the right  coronary artery.  Angiomax was given.  ACT was 337.   A P[place]ater angioplasty wire was positioned into the distal right  coronary artery.  At this point, we were able to get reperfusion.  It  was clear that there had been a spontaneous plaque disruption in the  distal right coronary artery.   A 3.0- x 15-mm apex was positioned across the stenosis and was inflated  up to 4 atmospheres for 26 seconds.  Following this, a Xience 2.75- x 18-  mm drug-eluting stent was positioned across the stenosis and was  inflated up to 12 atmospheres for 36 seconds.   Post stent dilatation was achieved using a 3.0- x 15-mm Voyager [place].  We  positioned in the distal aspect of the stent and inflated  it up to 12  atmospheres for 29 seconds.  It was then pulled back to the proximal  edge of the stent and was inflated up to 12 atmospheres for 19 seconds.  This gave [place] a very nice angiographic result with no evidence of edge  dissection.  There appears to be good apposition.  The patient was  stable leaving the cath lab.  Her blood pressure was slightly elevated.  In addition, her left ventricular end-diastolic pressure was up.  We  will give her some Lasix and place a Foley catheter.      Vesta Mixer, M.D.  Electronically Signed     PJN/MEDQ  D:  05/12/2008  T:  05/13/2008  Job:  [id number]   cc:   L. Lupe Carney, M.D.

## 2010-10-03 NOTE — H&P (Signed)
[name], [name] NO.:  [id number]   MEDICAL RECORD NO.:  [id number]          PATIENT TYPE:  INP   LOCATION:  2807                         FACILITY:  [facility]   PHYSICIAN:  Christell Faith, MD   DATE OF BIRTH:  Jun 12, 1940   DATE OF ADMISSION:  05/12/2008  DATE OF DISCHARGE:                              HISTORY & PHYSICAL   PRIMARY CARE PHYSICIAN:  L. Lupe Carney, MD   CHIEF COMPLAINT:  Chest pressure.   HISTORY OF PRESENT ILLNESS:  This is a 70 year old white female with a  strong family history of coronary artery disease as well as multiple  risk factors who was eating dinner tonight at approximately 7 p.m. when  she developed intense 10/10 chest pressure radiating down both arms.  Initially, she had both nausea and diaphoresis.  EMS was called and  documented inferior ST elevation and the patient received four baby  aspirin.  She arrived at [facility] and was taken emergently to the Cath  Lab by Dr. Elease Hashimoto.  See details below.  The patient has no personal  history of coronary artery disease and has not been having any angina  recently; however, she does note shortness of breath with exertion in  the preceding weeks.   PAST MEDICAL HISTORY:  1. Hypertension.  2. Hyperlipidemia.  3. Fibromyalgia.  4. Osteoarthritis.   SOCIAL HISTORY:  Lives in [place].  She is single.  She is retired  from Physicist, medical.  She is a nonsmoker, nondrinker.  No drug.  She has  two daughters, one of whom accompanies her tonight.   FAMILY HISTORY:  Mother died of an MI and the patient has five brothers,  all of whom have coronary artery disease including one brother who  recently had multivessel CABG.   ALLERGIES:  CODEINE.   MEDICATIONS:  Lipitor, Effexor, Benicar, tramadol, and Voltaren.   REVIEW OF SYSTEMS:  Positive for shortness of breath, diffuse pain,  myalgias, arthralgias, and anxiety.  She is sedentary, otherwise the  balance of 14 systems is reviewed and  is negative.   PHYSICAL EXAMINATION:  VITAL SIGNS:  Pulse 76, respiratory rate 22,  blood pressure 185/94, and saturation 99% on 2 liters.  GENERAL:  This is a very pleasant white female in mild distress due to  chest discomfort.  HEENT:  Pupils are equal, round, and reactive to light.  Extraocular  movements are intact.  Sclerae are clear.  Dentition is good.  NECK:  Supple.  Neck veins are flat.  No carotid bruits.  No cervical  adenopathy or thyromegaly.  LUNGS:  Clear to auscultation bilaterally without wheezing or rales.  CARDIAC:  Normal rate, regular rhythm.  No murmurs or gallops.  ABDOMEN:  Obese, soft, nontender, and nondistended.  EXTREMITIES:  2+ right femoral pulse, 2+ dorsalis pedis pulses  bilaterally, 2+ radial pulses bilaterally.  SKIN:  No rashes or bruising.  MUSCULOSKELETAL:  No acute joint effusions or deformities.  NEUROLOGIC:  Awake, alert, and oriented x3, 5/5 strength in all four  extremities.   DIAGNOSTIC TESTS:  EKG shows sinus rhythm  with inferior ST elevations  reciprocal depressions in I and aVL and mild depression in V1 and V2.   Lab work is pending.   IMPRESSION:  A 70 year old white female with acute inferior ST elevation  MI.   PLAN:  1. Taken emergently to the Cath Lab for primary PCI by Dr. Elease Hashimoto.      The treatment plan will be Angiomax, Plavix, and aspirin.  2. Continue Lipitor increased dose to 80 mg daily and initiate beta-      blocker Lopressor 25 mg b.i.d.  3. Check transthoracic echocardiogram for post MI documentation.  4. Check fasting lipid panel in the morning.  5. Adjust antihypertensive medicines as needed for optimal blood      pressure control.  For the time being, continue Benicar and      initiate Lopressor.  6. Post MI care in the CCU.  7. Cardiac rehab.      Christell Faith, MD  Electronically Signed     NDL/MEDQ  D:  05/12/2008  T:  05/13/2008  Job:  [id number]

## 2010-11-08 ENCOUNTER — Other Ambulatory Visit (HOSPITAL_COMMUNITY): Payer: Self-pay | Admitting: Family Medicine

## 2010-11-08 DIAGNOSIS — E0590 Thyrotoxicosis, unspecified without thyrotoxic crisis or storm: Secondary | ICD-10-CM

## 2010-11-15 ENCOUNTER — Encounter (HOSPITAL_COMMUNITY)
Admission: RE | Admit: 2010-11-15 | Discharge: 2010-11-15 | Disposition: A | Discharge status: Home / Self Care | Payer: Medicare Other | Source: Ambulatory Visit | Attending: Family Medicine | Admitting: Family Medicine

## 2010-11-15 DIAGNOSIS — E0520 Thyrotoxicosis with toxic multinodular goiter without thyrotoxic crisis or storm: Secondary | ICD-10-CM | POA: Insufficient documentation

## 2010-11-15 DIAGNOSIS — E0590 Thyrotoxicosis, unspecified without thyrotoxic crisis or storm: Secondary | ICD-10-CM

## 2010-11-16 ENCOUNTER — Encounter (HOSPITAL_COMMUNITY)
Admission: RE | Admit: 2010-11-16 | Discharge: 2010-11-16 | Disposition: A | Discharge status: Home / Self Care | Payer: Medicare Other | Source: Ambulatory Visit | Attending: Family Medicine | Admitting: Family Medicine

## 2010-11-16 MED ORDER — SODIUM PERTECHNETATE TC 99M INJECTION
10.0000 | Freq: Once | INTRAVENOUS | Status: AC | PRN
Start: 2010-11-16 — End: 2010-11-16
  Administered 2010-11-16: 10 via INTRAVENOUS

## 2010-11-16 MED ORDER — SODIUM IODIDE I 131 CAPSULE
9.0000 | Freq: Once | INTRAVENOUS | Status: AC | PRN
  Administered 2010-11-16: 9 via ORAL

## 2010-11-20 ENCOUNTER — Other Ambulatory Visit: Payer: Self-pay | Admitting: *Deleted

## 2010-11-20 DIAGNOSIS — R42 Dizziness and giddiness: Secondary | ICD-10-CM

## 2010-11-20 DIAGNOSIS — E785 Hyperlipidemia, unspecified: Secondary | ICD-10-CM

## 2010-11-20 MED ORDER — LISINOPRIL 10 MG PO TABS: 10.0000 mg | ORAL_TABLET | Freq: Every day | ORAL | Status: DC

## 2010-11-20 MED ORDER — ROSUVASTATIN CALCIUM 10 MG PO TABS: 10.0000 mg | ORAL_TABLET | Freq: Every day | ORAL | Status: DC

## 2010-11-20 NOTE — Telephone Encounter (Signed)
Med strength verified and refilled,  app set for fasting labs.

## 2010-11-21 ENCOUNTER — Other Ambulatory Visit: Payer: Self-pay | Admitting: Cardiovascular Disease

## 2010-11-21 ENCOUNTER — Other Ambulatory Visit (INDEPENDENT_AMBULATORY_CARE_PROVIDER_SITE_OTHER): Payer: Medicare Other | Admitting: *Deleted

## 2010-11-21 DIAGNOSIS — E785 Hyperlipidemia, unspecified: Secondary | ICD-10-CM

## 2010-11-21 LAB — LIPID PANEL: Triglycerides: 95 mg/dL (ref 0.0–149.0)

## 2010-11-21 LAB — BASIC METABOLIC PANEL
CO2: 25 mEq/L (ref 19–32)
Calcium: 9.6 mg/dL (ref 8.4–10.5)
Creatinine, Ser: 1.2 mg/dL (ref 0.4–1.2)
Glucose, Bld: 93 mg/dL (ref 70–99)

## 2010-11-21 LAB — HEPATIC FUNCTION PANEL
ALT: 16 U/L (ref 0–35)
AST: 18 U/L (ref 0–37)
Bilirubin, Direct: 0 mg/dL (ref 0.0–0.3)
Total Bilirubin: 0.2 mg/dL — ABNORMAL LOW (ref 0.3–1.2)

## 2010-11-24 NOTE — Progress Notes (Signed)
Results given.

## 2011-01-02 ENCOUNTER — Encounter: Payer: Self-pay | Admitting: Cardiovascular Disease

## 2011-01-03 ENCOUNTER — Ambulatory Visit: Payer: Medicare Other | Admitting: Cardiovascular Disease

## 2011-02-23 ENCOUNTER — Other Ambulatory Visit: Payer: Self-pay | Admitting: *Deleted

## 2011-02-23 LAB — PROTIME-INR
Prothrombin Time: 13.3 seconds (ref 11.6–15.2)
Prothrombin Time: 30.4 seconds — ABNORMAL HIGH (ref 11.6–15.2)

## 2011-02-23 LAB — BASIC METABOLIC PANEL
BUN: 15 mg/dL (ref 6–23)
BUN: 18 mg/dL (ref 6–23)
CO2: 25 mEq/L (ref 19–32)
CO2: 27 mEq/L (ref 19–32)
Calcium: 8.8 mg/dL (ref 8.4–10.5)
Chloride: 107 mEq/L (ref 96–112)
Creatinine, Ser: 0.9 mg/dL (ref 0.4–1.2)
Creatinine, Ser: 1.09 mg/dL (ref 0.4–1.2)
GFR calc Af Amer: >60 mL/min (ref >60)
Glucose, Bld: 105 mg/dL — ABNORMAL HIGH (ref 70–99)

## 2011-02-23 LAB — MAGNESIUM: Magnesium: 2 mg/dL (ref 1.5–2.5)

## 2011-02-23 LAB — LIPID PANEL
HDL: 40 mg/dL (ref >39)
LDL Cholesterol: 101 mg/dL — ABNORMAL HIGH (ref 0–99)
LDL Cholesterol: 124 mg/dL — ABNORMAL HIGH (ref 0–99)
Triglycerides: 109 mg/dL (ref <150)
Triglycerides: 110 mg/dL (ref <150)
VLDL: 22 mg/dL (ref 0–40)

## 2011-02-23 LAB — CBC
HCT: 33.6 % — ABNORMAL LOW (ref 36.0–46.0)
Hemoglobin: 11 g/dL — ABNORMAL LOW (ref 12.0–15.0)
Hemoglobin: 11 g/dL — ABNORMAL LOW (ref 12.0–15.0)
MCHC: 32.7 g/dL (ref 30.0–36.0)
MCV: 89.2 fL (ref 78.0–100.0)
Platelets: 184 10*3/uL (ref 150–400)
RBC: 3.76 MIL/uL — ABNORMAL LOW (ref 3.87–5.11)
RDW: 14.1 % (ref 11.5–15.5)
RDW: 14.1 % (ref 11.5–15.5)
WBC: 12.5 10*3/uL — ABNORMAL HIGH (ref 4.0–10.5)

## 2011-02-23 LAB — CARDIAC PANEL(CRET KIN+CKTOT+MB+TROPI)
CK, MB: 56.8 ng/mL — ABNORMAL HIGH (ref 0.3–4.0)
Relative Index: 13 — ABNORMAL HIGH (ref 0.0–2.5)
Relative Index: 4.8 — ABNORMAL HIGH (ref 0.0–2.5)

## 2011-02-23 LAB — COMPREHENSIVE METABOLIC PANEL
Albumin: 3.6 g/dL (ref 3.5–5.2)
Alkaline Phosphatase: 102 U/L (ref 39–117)
BUN: 19 mg/dL (ref 6–23)
Chloride: 106 mEq/L (ref 96–112)
Glucose, Bld: 166 mg/dL — ABNORMAL HIGH (ref 70–99)
Potassium: 4 mEq/L (ref 3.5–5.1)
Total Bilirubin: 0.5 mg/dL (ref 0.3–1.2)

## 2011-02-23 LAB — B-NATRIURETIC PEPTIDE (CONVERTED LAB): Pro B Natriuretic peptide (BNP): 63 pg/mL (ref 0.0–100.0)

## 2011-02-23 MED ORDER — POTASSIUM CHLORIDE 10 MEQ PO TBCR: 10.0000 meq | EXTENDED_RELEASE_TABLET | Freq: Every day | ORAL | Status: DC

## 2011-02-23 NOTE — Telephone Encounter (Signed)
Fax Received. Refill Completed. [name] (M.A)  

## 2011-02-26 ENCOUNTER — Telehealth: Payer: Self-pay | Admitting: Cardiovascular Disease

## 2011-02-26 ENCOUNTER — Encounter: Payer: Self-pay | Admitting: *Deleted

## 2011-02-26 NOTE — Telephone Encounter (Signed)
Per Nettie Elm, pt need to come off plavix now or 5 day prior . Surgery might be reschedule .  surgery on 10/10.

## 2011-02-26 NOTE — Telephone Encounter (Signed)
Pt having surgery on her foot/ neuroma excision  In two days, per dr Elease Hashimoto she may stop plavix 5 days prior .

## 2011-02-27 ENCOUNTER — Telehealth: Payer: Self-pay | Admitting: Nurse Practitioner

## 2011-02-27 NOTE — Telephone Encounter (Signed)
All Cardiac faxed to [facility] @ [phone]   02/27/11/km

## 2011-06-06 ENCOUNTER — Other Ambulatory Visit: Payer: Self-pay

## 2011-06-06 DIAGNOSIS — R42 Dizziness and giddiness: Secondary | ICD-10-CM

## 2011-06-06 MED ORDER — ROSUVASTATIN CALCIUM 10 MG PO TABS: 10.0000 mg | ORAL_TABLET | Freq: Every day | ORAL | Status: DC

## 2011-06-06 MED ORDER — LISINOPRIL 10 MG PO TABS: 10.0000 mg | ORAL_TABLET | Freq: Every day | ORAL | Status: DC

## 2011-07-06 ENCOUNTER — Other Ambulatory Visit: Payer: Self-pay | Admitting: Nurse Practitioner

## 2011-07-18 DIAGNOSIS — R300 Dysuria: Secondary | ICD-10-CM | POA: Diagnosis not present

## 2011-07-18 DIAGNOSIS — I10 Essential (primary) hypertension: Secondary | ICD-10-CM | POA: Diagnosis not present

## 2011-07-18 DIAGNOSIS — F411 Generalized anxiety disorder: Secondary | ICD-10-CM | POA: Diagnosis not present

## 2011-07-18 DIAGNOSIS — K219 Gastro-esophageal reflux disease without esophagitis: Secondary | ICD-10-CM | POA: Diagnosis not present

## 2011-07-18 DIAGNOSIS — E0590 Thyrotoxicosis, unspecified without thyrotoxic crisis or storm: Secondary | ICD-10-CM | POA: Diagnosis not present

## 2011-07-18 DIAGNOSIS — G2589 Other specified extrapyramidal and movement disorders: Secondary | ICD-10-CM | POA: Diagnosis not present

## 2011-07-18 DIAGNOSIS — M1990 Unspecified osteoarthritis, unspecified site: Secondary | ICD-10-CM | POA: Diagnosis not present

## 2011-08-29 ENCOUNTER — Ambulatory Visit (INDEPENDENT_AMBULATORY_CARE_PROVIDER_SITE_OTHER): Payer: Medicare Other | Admitting: Family Medicine

## 2011-08-29 ENCOUNTER — Encounter: Payer: Self-pay | Admitting: Family Medicine

## 2011-08-29 VITALS — BP 143/61 | HR 72 | Temp 97.8°F | Resp 16 | Ht 66.0 in | Wt 221.6 lb

## 2011-08-29 DIAGNOSIS — R5383 Other fatigue: Secondary | ICD-10-CM

## 2011-08-29 DIAGNOSIS — J40 Bronchitis, not specified as acute or chronic: Secondary | ICD-10-CM | POA: Diagnosis not present

## 2011-08-29 DIAGNOSIS — J069 Acute upper respiratory infection, unspecified: Secondary | ICD-10-CM

## 2011-08-29 DIAGNOSIS — E039 Hypothyroidism, unspecified: Secondary | ICD-10-CM

## 2011-08-29 DIAGNOSIS — E559 Vitamin D deficiency, unspecified: Secondary | ICD-10-CM | POA: Diagnosis not present

## 2011-08-29 DIAGNOSIS — M791 Myalgia, unspecified site: Secondary | ICD-10-CM

## 2011-08-29 DIAGNOSIS — M1990 Unspecified osteoarthritis, unspecified site: Secondary | ICD-10-CM

## 2011-08-29 DIAGNOSIS — M8580 Other specified disorders of bone density and structure, unspecified site: Secondary | ICD-10-CM

## 2011-08-29 MED ORDER — LEVOFLOXACIN 500 MG PO TABS: 500.0000 mg | ORAL_TABLET | Freq: Every day | ORAL | Status: AC

## 2011-08-29 MED ORDER — HYDROCODONE-HOMATROPINE 5-1.5 MG/5ML PO SYRP: 5.0000 mL | ORAL_SOLUTION | Freq: Three times a day (TID) | ORAL | Status: AC | PRN

## 2011-08-29 NOTE — Progress Notes (Signed)
71 yo woman with 1 week of URI sx:  Coughing, congestion, sorethroat.  No improvement over last 5 days.  Taking codeine cough medicine and mucinex.  Now having mattering in both eyes  Notes fibromyalgia, osteoarthritis which have worsened with this illness  Notes recurring URI's over 6 months.  O::NAD HEENT:  Dried blood left nostril, o/w negative Neck no adenopathy or thyromegaly, supple Skin warm and dry Chest: bilateral rales Heart: regular  A:  Recurrent URI:  ?vitamin D problem Bronchitis, acute and recurrent  P:  Levaquin 500 x 10 days

## 2011-08-30 LAB — VITAMIN D 25 HYDROXY (VIT D DEFICIENCY, FRACTURES): Vit D, 25-Hydroxy: 35 ng/mL (ref 30–89)

## 2011-09-03 ENCOUNTER — Telehealth: Payer: Self-pay | Admitting: Cardiovascular Disease

## 2011-09-03 DIAGNOSIS — R42 Dizziness and giddiness: Secondary | ICD-10-CM

## 2011-09-03 DIAGNOSIS — M545 Low back pain: Secondary | ICD-10-CM | POA: Diagnosis not present

## 2011-09-03 DIAGNOSIS — I10 Essential (primary) hypertension: Secondary | ICD-10-CM

## 2011-09-03 DIAGNOSIS — I2510 Atherosclerotic heart disease of native coronary artery without angina pectoris: Secondary | ICD-10-CM

## 2011-09-03 MED ORDER — AMLODIPINE BESYLATE 5 MG PO TABS: 5.0000 mg | ORAL_TABLET | Freq: Every day | ORAL | Status: DC

## 2011-09-03 MED ORDER — LISINOPRIL 10 MG PO TABS: 10.0000 mg | ORAL_TABLET | Freq: Every day | ORAL | Status: DC

## 2011-09-03 MED ORDER — CARVEDILOL 12.5 MG PO TABS: 12.5000 mg | ORAL_TABLET | Freq: Two times a day (BID) | ORAL | Status: DC

## 2011-09-03 NOTE — Telephone Encounter (Signed)
Refilled medication

## 2011-09-03 NOTE — Telephone Encounter (Signed)
New Problem:    PAtient called in needing refills of her lisinopril (PRINIVIL,ZESTRIL) 10 MG tablet, amLODipine (NORVASC) 5 MG tablet, carvedilol (COREG) 12.5 MG tablet.

## 2011-09-07 ENCOUNTER — Encounter: Payer: Self-pay | Admitting: Nurse Practitioner

## 2011-09-07 ENCOUNTER — Ambulatory Visit (INDEPENDENT_AMBULATORY_CARE_PROVIDER_SITE_OTHER): Payer: Medicare Other | Admitting: Nurse Practitioner

## 2011-09-07 VITALS — BP 160/76 | HR 75 | Ht 66.5 in | Wt 218.8 lb

## 2011-09-07 DIAGNOSIS — I2510 Atherosclerotic heart disease of native coronary artery without angina pectoris: Secondary | ICD-10-CM | POA: Diagnosis not present

## 2011-09-07 DIAGNOSIS — M797 Fibromyalgia: Secondary | ICD-10-CM | POA: Insufficient documentation

## 2011-09-07 DIAGNOSIS — I10 Essential (primary) hypertension: Secondary | ICD-10-CM

## 2011-09-07 DIAGNOSIS — R609 Edema, unspecified: Secondary | ICD-10-CM

## 2011-09-07 MED ORDER — LISINOPRIL 40 MG PO TABS: 40.0000 mg | ORAL_TABLET | Freq: Every day | ORAL | Status: DC

## 2011-09-07 NOTE — Patient Instructions (Addendum)
Your physician has recommended you make the following change in your medication: Increase your Lisinopril to 40 mg; take one tablet every day.  This has been sent into your pharmacy.    Your physician has requested that you have a lower  extremity venous duplex. This test is an ultrasound of the veins in the legs. It looks at venous blood flow that carries blood from the heart to the legs. Allow one hour for a Lower Venous exam.   Return for a blood pressure check in 1 week.

## 2011-09-07 NOTE — Progress Notes (Signed)
Patient Name: [name] Date of Encounter: 09/07/2011  Primary Care Provider:  Benita Stabile, MD, MD Primary Cardiologist:  Katherina Right, MD  Patient Profile  71 y/o female with h/o CAD who presents for eval re: lower ext edema.  Problem List   Past Medical History  Diagnosis Date  . Hypertension   . Hyperlipidemia   . Fatigue   . Anxiety   . Coronary artery disease     a. s/p MI 2009 - PCI/DES distal  RCA w/ 2.75 x 18 Xience DES;  b. 05/2010 Myoview: Apical thinning, EF 73%;  c. 06/2010 Cath: moderate nonobs dzs, EF 65%  . Fibromyalgia    Past Surgical History  Procedure Date  . Cardiac catheterization 06/29/2010    Mild to moderate coronary artery irregularities.  Her  proximal left anterior descending artery is moderately narrowed.  She  also has an eccentric stenosis in the proximal LAD.  These do not appear  to obstruct flow at present, but they are fairly small vessels.  They  appeared to be between 1.5 and 2 mm in diamete  . Cardiac catheterization 08/02/2009    Patent stent  . Cardiac catheterization 05/12/2008    Stent to the distal RCA    Allergies  Allergies  Allergen Reactions  . Codeine   . Cortisone     HPI  71 year old female with the above problem list.  She was in usual state of health about 3 weeks ago when she began to note mild lower extremity edema with bilateral ankle and calf pain with ambulation.  She takes her left leg has been more swollen than the right.  She has been relatively active recently partaking and gardening without any chest pain or dyspnea.  Her weight is actually down from baseline.  She's seen no change in her salt intake or amt of time that her legs might be in a dependent position.  She's quite flustered and anxious today.  She misplaced her phone and her dtr initially dropped her off @ the old [place] cardiology office and she had to walk down the street to here.  She doesn't know how to reach her dtr to have her come down  and pick her up.  She's tearful and emotionally upset.  In that setting, after the visit, pt said that she thinks she's having chest pain.  An ecg was done, and before it was even completed, c/p resolved, as did her emotional upset.  Home Medications  Prior to Admission medications   Medication Sig Start Date End Date Taking? Authorizing Provider  acetaminophen (TYLENOL) 500 MG tablet Take 500 mg by mouth as needed.    Yes Historical Provider, MD  amitriptyline (ELAVIL) 10 MG tablet Take 10 mg by mouth at bedtime.   Yes Historical Provider, MD  amLODipine (NORVASC) 5 MG tablet Take 1 tablet (5 mg total) by mouth daily. 09/03/11 09/02/12 Yes Vesta Mixer, MD  aspirin 325 MG tablet Take 325 mg by mouth daily.     Yes Historical Provider, MD  carvedilol (COREG) 12.5 MG tablet Take 1 tablet (12.5 mg total) by mouth 2 (two) times daily. 09/03/11 09/02/12 Yes Vesta Mixer, MD  clopidogrel (PLAVIX) 75 MG tablet TAKE ONE TABLET BY MOUTH DAILY 07/06/11  Yes Rosalio Macadamia, NP  diclofenac sodium (VOLTAREN) 1 % GEL Apply topically. **CREAM** use as directed    Yes Historical Provider, MD  fluticasone (FLONASE) 50 MCG/ACT nasal spray Place 1 spray into the nose  daily.   Yes Historical Provider, MD  hydrochlorothiazide 25 MG tablet Take 25 mg by mouth daily.    Yes Historical Provider, MD  HYDROcodone-homatropine (HYCODAN) 5-1.5 MG/5ML syrup Take 5 mLs by mouth every 8 (eight) hours as needed for cough. 08/29/11 09/08/11 Yes Elvina Sidle, MD  IRON PO Take 1 tablet by mouth daily.     Yes Historical Provider, MD  levofloxacin (LEVAQUIN) 500 MG tablet Take 1 tablet (500 mg total) by mouth daily. 08/29/11 09/08/11 Yes Elvina Sidle, MD  methimazole (TAPAZOLE) 5 MG tablet Take 5 mg by mouth daily.   Yes Historical Provider, MD  nitroGLYCERIN (NITROSTAT) 0.4 MG SL tablet Place 0.4 mg under the tongue every 5 (five) minutes as needed.     Yes Historical Provider, MD  pantoprazole (PROTONIX) 40 MG tablet Take 40  mg by mouth 2 (two) times daily.    Yes Historical Provider, MD  potassium chloride (KLOR-CON) 10 MEQ CR tablet Take 1 tablet (10 mEq total) by mouth daily. 02/23/11  Yes Vesta Mixer, MD  pramipexole (MIRAPEX) 0.25 MG tablet Take 1 mg by mouth at bedtime.    Yes Historical Provider, MD  rosuvastatin (CRESTOR) 10 MG tablet Take 1 tablet (10 mg total) by mouth daily. 06/06/11  Yes Vesta Mixer, MD  traMADol (ULTRAM) 50 MG tablet Take 50 mg by mouth 3 (three) times daily as needed.    Yes Historical Provider, MD  venlafaxine (EFFEXOR) 75 MG tablet Take 75 mg by mouth daily. 1/2 tablet 2 (two) times daily   Yes Historical Provider, MD  lisinopril (PRINIVIL,ZESTRIL) 40 MG tablet Take 1 tablet (40 mg total) by mouth daily. 09/07/11 09/06/12  Vesta Mixer, MD    Review of Systems  Brief episode of c/p as outlined above.  Lower ext swelling and bilat leg pain as outlined above.  No sob, n, v, dizziness, syncope, edema, early satiety, dysuria, dark stools, blood in stools, diarrhea, rash/skin changes, fevers, chills, wt loss/gain.  Otherwise all systems reviewed and negative.  Physical Exam  Blood pressure 160/76, pulse 75, height 5' 6.5" (1.689 m), weight 218 lb 12.8 oz (99.247 kg).  General: Pleasant, NAD Psych: Normal affect. Neuro: Alert and oriented X 3. Moves all extremities spontaneously. HEENT: Normal  Neck: Supple without bruits or JVD. Lungs:  Resp regular and unlabored, CTA. Heart: RRR no s3, s4. 1/6 sem lusb. Abdomen: Soft, non-tender, non-distended, BS + x 4.  Extremities: No clubbing, cyanosis or edema.  Legs are tender to palpation. DP/PT/Radials 2+ and equal bilaterally.  Accessory Clinical Findings  ECG - rsr, 75, pvc's, no acute st/t changes.  Assessment & Plan  1.  Mild Lower extremity edema:  Pt reports a 3 wk h/o mild lower ext edema.  She has no evidence of pitting edema on exam.  Her legs are tender to touch and she says that her calves have been hurting.  We will  obtain a bilat LE u/s to r/o DVT.    2.  Hypertension:  Inadequately controlled.  Will titrate her lisinopril to 40 mg daily.  We will have her come back for a blood pressure check in one week at which time we'll also check a basic metabolic profile.  3.  CAD:  Pt has been doing well from this standpoint.  While in the office today, she had a very brief episode of chest discomfort in the setting of tearful emotional upset.  Her ECG showed no acute st/t changes and c/p had resolved within  a few seconds.  Do not think this requires further evaluation @ this time however if she has more chest discomfort going forward, she would need a myoview.  Cont asa, plavix, bb, acei (titrate), and statin.  4. HL:  Cont statin.  5.  Ventricular ectopy:  Asymptomatic.  Noted previously.  Cont bb.  Arranging for bmet.  6.  Dispo:  F/u bmet and bp @ time of lower ext u/s.  F/U Dr. Elease Hashimoto as scheduled.  Nicolasa Ducking, NP 09/07/2011, 4:30 PM

## 2011-09-11 DIAGNOSIS — M542 Cervicalgia: Secondary | ICD-10-CM | POA: Diagnosis not present

## 2011-09-11 DIAGNOSIS — M25519 Pain in unspecified shoulder: Secondary | ICD-10-CM | POA: Diagnosis not present

## 2011-09-18 ENCOUNTER — Other Ambulatory Visit: Payer: Medicare Other

## 2011-09-20 ENCOUNTER — Telehealth: Payer: Self-pay | Admitting: *Deleted

## 2011-09-20 NOTE — Telephone Encounter (Signed)
09/20/11 On 09/07/11 I mailed a letter to [name] about rescheduling her appointment on 09/18/11.On 09/19/11 a message was forward to me from the patient it states.I received your note ref.my appointment on 09/18/11 at 2:30. You stated that you need to change my appointment. Actually I am going to cancel altogether because the pain has just about gone away. I think it a type of tendonitis. I applied ice and that helped tremendously. I really don't see the need for this procedure right now. I definitely will keep a watch on this and if I need any more assistance, I will call on you.

## 2011-09-25 DIAGNOSIS — M25519 Pain in unspecified shoulder: Secondary | ICD-10-CM | POA: Diagnosis not present

## 2011-09-25 DIAGNOSIS — M7520 Bicipital tendinitis, unspecified shoulder: Secondary | ICD-10-CM | POA: Diagnosis not present

## 2011-10-11 ENCOUNTER — Telehealth: Payer: Self-pay

## 2011-10-11 NOTE — Telephone Encounter (Signed)
Feels bad, nauseated, dizzy  Would like dr. Milus Glazier to call her. [phone]

## 2011-10-12 NOTE — Telephone Encounter (Signed)
PT HAS NO ENERGY WHEN SHE GETS UP. SHE HAS NAUSEA, DIZZINESS, ACHINESS. HAS FIBROMYALGIA. ADVISED PT SHE NEEDS TO BE SEEN FOR THIS. PT STATES SHE WILL IF SHE DOESN'T FEEL BETTER.

## 2011-10-24 ENCOUNTER — Other Ambulatory Visit: Payer: Self-pay | Admitting: Nurse Practitioner

## 2011-10-25 NOTE — Telephone Encounter (Signed)
..   Requested Prescriptions   Pending Prescriptions Disp Refills  . clopidogrel (PLAVIX) 75 MG tablet [Pharmacy Med Name: CLOPIDOGREL 75MG  TABLETS] 90 tablet 3    Sig: TAKE ONE TABLET BY MOUTH DAILY

## 2011-11-18 ENCOUNTER — Ambulatory Visit (INDEPENDENT_AMBULATORY_CARE_PROVIDER_SITE_OTHER): Payer: Medicare Other | Admitting: Internal Medicine

## 2011-11-18 ENCOUNTER — Other Ambulatory Visit: Payer: Self-pay | Admitting: Internal Medicine

## 2011-11-18 VITALS — BP 154/75 | HR 64 | Temp 97.8°F | Resp 16 | Ht 66.38 in | Wt 215.0 lb

## 2011-11-18 DIAGNOSIS — M7020 Olecranon bursitis, unspecified elbow: Secondary | ICD-10-CM

## 2011-11-18 DIAGNOSIS — IMO0002 Reserved for concepts with insufficient information to code with codable children: Secondary | ICD-10-CM

## 2011-11-18 DIAGNOSIS — M7022 Olecranon bursitis, left elbow: Secondary | ICD-10-CM

## 2011-11-18 DIAGNOSIS — L03119 Cellulitis of unspecified part of limb: Secondary | ICD-10-CM

## 2011-11-18 MED ORDER — DOXYCYCLINE HYCLATE 100 MG PO TABS: 100.0000 mg | ORAL_TABLET | Freq: Two times a day (BID) | ORAL | Status: AC

## 2011-11-18 NOTE — Patient Instructions (Addendum)
Heat for 20-30 minutes twice a day Aspirin for discomfort Recheck if not better in 3 days

## 2011-11-18 NOTE — Progress Notes (Signed)
  Subjective:    Patient ID: [name], female    DOB: 1940/09/27, 71 y.o.   MRN: [id number]  HPIComplaining of swelling and redness of the left elbow for the past 3 days No known injury except the elbows are often rubbed on surfaces/no fever chills or night sweats No pain with motion of the joint, just tender to touch   Past medical history= Patient Active Problem List  Diagnosis  . Coronary artery disease  . Hypertension  . Hyperlipidemia  . Fatigue  . History of myocardial infarction  . Dizziness  . Orthostatic hypotension  . Hematuria  . Fibromyalgia    Review of Systems     Objective:   Physical Exam Blood pressure 154/75/weight 215 pounds Left arm with a red swollen tender soft mass over the olecranon Aspiration with 18-gauge and syringe is a dry tap Blood expressed and cultured The elbow has full range of motion There is mild redness around the mass as well       Assessment & Plan:  Problem #1 olecranon bursitis with probable cellulitis  Doxycycline 100 twice a day for 10 days Hot compresses 3 times a day Aspirin for pain if desired

## 2011-11-22 LAB — WOUND CULTURE

## 2011-12-13 ENCOUNTER — Other Ambulatory Visit: Payer: Self-pay | Admitting: *Deleted

## 2011-12-13 DIAGNOSIS — I10 Essential (primary) hypertension: Secondary | ICD-10-CM

## 2011-12-13 DIAGNOSIS — I2510 Atherosclerotic heart disease of native coronary artery without angina pectoris: Secondary | ICD-10-CM

## 2011-12-13 MED ORDER — AMLODIPINE BESYLATE 5 MG PO TABS: 5.0000 mg | ORAL_TABLET | Freq: Every day | ORAL | Status: DC

## 2011-12-13 MED ORDER — ROSUVASTATIN CALCIUM 10 MG PO TABS: 10.0000 mg | ORAL_TABLET | Freq: Every day | ORAL | Status: DC

## 2011-12-13 MED ORDER — CARVEDILOL 12.5 MG PO TABS: 12.5000 mg | ORAL_TABLET | Freq: Two times a day (BID) | ORAL | Status: DC

## 2012-01-16 ENCOUNTER — Telehealth: Payer: Self-pay | Admitting: Cardiovascular Disease

## 2012-01-16 NOTE — Telephone Encounter (Signed)
Please return call to patient at [phone] regarding medication questions

## 2012-01-16 NOTE — Telephone Encounter (Signed)
PT QUESTIONED IF SHE WAS TO BE ON HCTZ, PT FOUND THE MED IN HER CUPBOARD. MEDICATION ON LIST, NOT SEEN WHERE IT WAS STOPPED, PT COMPLAINING OF EDEMA, TOLD PT SHE WAS TO BE ON IT.  TRIED TO MAKE APP FOR HER BUT SHE DEPENDS ON FAMILY TO BRING HER SO SHE WILL CALL BACK FOR APP OR SEE PCP FOR EDEMA.

## 2012-02-28 DIAGNOSIS — M19049 Primary osteoarthritis, unspecified hand: Secondary | ICD-10-CM | POA: Diagnosis not present

## 2012-02-28 DIAGNOSIS — M25569 Pain in unspecified knee: Secondary | ICD-10-CM | POA: Diagnosis not present

## 2012-02-28 DIAGNOSIS — M19019 Primary osteoarthritis, unspecified shoulder: Secondary | ICD-10-CM | POA: Diagnosis not present

## 2012-04-03 DIAGNOSIS — F411 Generalized anxiety disorder: Secondary | ICD-10-CM | POA: Diagnosis not present

## 2012-04-03 DIAGNOSIS — I10 Essential (primary) hypertension: Secondary | ICD-10-CM | POA: Diagnosis not present

## 2012-04-03 DIAGNOSIS — Z23 Encounter for immunization: Secondary | ICD-10-CM | POA: Diagnosis not present

## 2012-04-03 DIAGNOSIS — E0590 Thyrotoxicosis, unspecified without thyrotoxic crisis or storm: Secondary | ICD-10-CM | POA: Diagnosis not present

## 2012-04-03 DIAGNOSIS — K219 Gastro-esophageal reflux disease without esophagitis: Secondary | ICD-10-CM | POA: Diagnosis not present

## 2012-04-03 DIAGNOSIS — M1990 Unspecified osteoarthritis, unspecified site: Secondary | ICD-10-CM | POA: Diagnosis not present

## 2012-04-03 DIAGNOSIS — G2589 Other specified extrapyramidal and movement disorders: Secondary | ICD-10-CM | POA: Diagnosis not present

## 2012-05-29 ENCOUNTER — Ambulatory Visit (INDEPENDENT_AMBULATORY_CARE_PROVIDER_SITE_OTHER): Payer: Medicare Other | Admitting: Family Medicine

## 2012-05-29 VITALS — BP 162/67 | HR 94 | Temp 98.4°F | Resp 16 | Ht 66.5 in | Wt 203.0 lb

## 2012-05-29 DIAGNOSIS — N390 Urinary tract infection, site not specified: Secondary | ICD-10-CM

## 2012-05-29 DIAGNOSIS — R300 Dysuria: Secondary | ICD-10-CM

## 2012-05-29 LAB — POCT URINALYSIS DIPSTICK
Bilirubin, UA: NEGATIVE
Glucose, UA: NEGATIVE
Ketones, UA: NEGATIVE
Nitrite, UA: POSITIVE
Spec Grav, UA: 1.015
Urobilinogen, UA: 0.2
pH, UA: 6

## 2012-05-29 LAB — POCT UA - MICROSCOPIC ONLY
Casts, Ur, LPF, POC: NEGATIVE
Crystals, Ur, HPF, POC: NEGATIVE
Mucus, UA: NEGATIVE
Yeast, UA: NEGATIVE

## 2012-05-29 MED ORDER — NITROFURANTOIN MONOHYD MACRO 100 MG PO CAPS: 100.0000 mg | ORAL_CAPSULE | Freq: Two times a day (BID) | ORAL | Status: DC

## 2012-05-29 NOTE — Progress Notes (Signed)
 [facility]:  Office Visit  Chief Complaint:  Chief Complaint  Patient presents with  . Urinary Tract Infection    hematuria and dysuria on day 2 weeks ago and it's returned today    HPI: [name] is a 72 y.o. female who complains of dysuria, and hemturia several weeks ago and got better, currently has urinary odor and dysuria. No fevers, chills, + pelvic pain. Has not taken any meds for it. Increase fluids. Has not seen any blood in urine since several weeks ago. She is on Plavix but denies any overt bleeding or easy bruising.   Past Medical History  Diagnosis Date  . Hypertension   . Hyperlipidemia   . Fatigue   . Anxiety   . Coronary artery disease     a. s/p MI 2009 - PCI/DES distal  RCA w/ 2.75 x 18 Xience DES;  b. 05/2010 Myoview: Apical thinning, EF 73%;  c. 06/2010 Cath: moderate nonobs dzs, EF 65%  . Fibromyalgia   . Essential iris atrophy     Right side   Past Surgical History  Procedure Date  . Cardiac catheterization 06/29/2010    Mild to moderate coronary artery irregularities.  Her  proximal left anterior descending artery is moderately narrowed.  She  also has an eccentric stenosis in the proximal LAD.  These do not appear  to obstruct flow at present, but they are fairly small vessels.  They  appeared to be between 1.5 and 2 mm in diamete  . Cardiac catheterization 08/02/2009    Patent stent  . Cardiac catheterization 05/12/2008    Stent to the distal RCA   History   Social History  . Marital Status: Divorced    Spouse Name: N/A    Number of Children: N/A  . Years of Education: N/A   Social History Main Topics  . Smoking status: Former Smoker    Quit date: 09/13/1960  . Smokeless tobacco: Never Used  . Alcohol Use: No  . Drug Use: No  . Sexually Active: None   Other Topics Concern  . None   Social History Narrative  . None   Family History  Problem Relation Age of Onset  . Heart attack Mother   . Coronary artery disease  Brother     had CABG  . Coronary artery disease Brother   . Coronary artery disease Brother   . Coronary artery disease Brother   . Coronary artery disease Brother   . Heart attack Brother    Allergies  Allergen Reactions  . Codeine   . Cortisone    Prior to Admission medications   Medication Sig Start Date End Date Taking? Authorizing Provider  amLODipine (NORVASC) 5 MG tablet Take 1 tablet (5 mg total) by mouth daily. 12/13/11 12/12/12 Yes Vesta Mixer, MD  aspirin 325 MG tablet Take 325 mg by mouth daily.     Yes Historical Provider, MD  carvedilol (COREG) 12.5 MG tablet Take 1 tablet (12.5 mg total) by mouth 2 (two) times daily. 12/13/11 12/12/12 Yes Vesta Mixer, MD  clopidogrel (PLAVIX) 75 MG tablet TAKE ONE TABLET BY MOUTH DAILY 10/24/11  Yes Ok Anis, NP  hydrochlorothiazide 25 MG tablet Take 25 mg by mouth daily.    Yes Historical Provider, MD  IRON PO Take 1 tablet by mouth daily.     Yes Historical Provider, MD  lisinopril (PRINIVIL,ZESTRIL) 40 MG tablet Take 1 tablet (40 mg total) by mouth daily. 09/07/11  09/06/12 Yes Vesta Mixer, MD  methimazole (TAPAZOLE) 5 MG tablet Take 5 mg by mouth daily.   Yes Historical Provider, MD  nitroGLYCERIN (NITROSTAT) 0.4 MG SL tablet Place 0.4 mg under the tongue every 5 (five) minutes as needed.     Yes Historical Provider, MD  pantoprazole (PROTONIX) 40 MG tablet Take 40 mg by mouth 2 (two) times daily.    Yes Historical Provider, MD  rosuvastatin (CRESTOR) 10 MG tablet Take 1 tablet (10 mg total) by mouth daily. 12/13/11  Yes Vesta Mixer, MD  traMADol (ULTRAM) 50 MG tablet Take 50 mg by mouth 3 (three) times daily as needed.    Yes Historical Provider, MD  venlafaxine (EFFEXOR) 75 MG tablet Take 75 mg by mouth daily. 1/2 tablet 2 (two) times daily   Yes Historical Provider, MD  pramipexole (MIRAPEX) 0.25 MG tablet Take 0.25 mg by mouth at bedtime.     Historical Provider, MD     ROS: The patient denies fevers, chills,  night sweats, unintentional weight loss, chest pain, palpitations, wheezing, dyspnea on exertion, nausea, vomitin, hematuria, melena, numbness, weakness, or tingling.   All other systems have been reviewed and were otherwise negative with the exception of those mentioned in the HPI and as above.    PHYSICAL EXAM: Filed Vitals:   05/29/12 1853  BP: 162/67  Pulse: 94  Temp: 98.4 F (36.9 C)  Resp: 16   Filed Vitals:   05/29/12 1853  Height: 5' 6.5" (1.689 m)  Weight: 203 lb (92.08 kg)   Body mass index is 32.27 kg/(m^2).  General: Alert, no acute distress HEENT:  Normocephalic, atraumatic, oropharynx patent.  Cardiovascular:  Regular rate and rhythm, no rubs murmurs or gallops.  No Carotid bruits, radial pulse intact. No pedal edema.  Respiratory: Clear to auscultation bilaterally.  No wheezes, rales, or rhonchi.  No cyanosis, no use of accessory musculature GI: No organomegaly, abdomen is soft and non-tender, positive bowel sounds.  No masses. Skin: No rashes. Neurologic: Facial musculature symmetric. Psychiatric: Patient is appropriate throughout our interaction. Lymphatic: No cervical lymphadenopathy Musculoskeletal: Gait intact. No CVA tenderness   LABS: Results for orders placed in visit on 05/29/12  POCT URINALYSIS DIPSTICK      Component Value Range   Color, UA yellow     Clarity, UA cloudy     Glucose, UA neg     Bilirubin, UA neg     Ketones, UA neg     Spec Grav, UA 1.015     Blood, UA small     pH, UA 6.0     Protein, UA trace     Urobilinogen, UA 0.2     Nitrite, UA positive     Leukocytes, UA moderate (2+)    POCT UA - MICROSCOPIC ONLY      Component Value Range   WBC, Ur, HPF, POC TNTC     RBC, urine, microscopic 3-6     Bacteria, U Microscopic 2+     Mucus, UA neg     Epithelial cells, urine per micros 2-4     Crystals, Ur, HPF, POC neg     Casts, Ur, LPF, POC neg     Yeast, UA neg       EKG/XRAY:   Primary read interpreted by Dr. Conley Rolls at  [facility].   ASSESSMENT/PLAN: Encounter Diagnoses  Name Primary?  . Dysuria Yes  . UTI (urinary tract infection)     Will culture urine She has good renal function  so will ut on Macrobid 100 mg BID x 7 days Increase fluids and carnaberry juice, she knows she has heart issues so will monitor fluid intake F/u prn   ,  PHUONG, DO 05/29/2012 7:38 PM

## 2012-06-02 LAB — URINE CULTURE: Colony Count: >=100000

## 2012-06-06 DIAGNOSIS — H25019 Cortical age-related cataract, unspecified eye: Secondary | ICD-10-CM | POA: Diagnosis not present

## 2012-06-06 DIAGNOSIS — H21269 Iris atrophy (essential) (progressive), unspecified eye: Secondary | ICD-10-CM | POA: Diagnosis not present

## 2012-06-07 ENCOUNTER — Emergency Department (HOSPITAL_COMMUNITY): Payer: Medicare Other

## 2012-06-07 ENCOUNTER — Encounter (HOSPITAL_COMMUNITY): Payer: Self-pay | Admitting: *Deleted

## 2012-06-07 ENCOUNTER — Emergency Department (HOSPITAL_COMMUNITY)
Admission: EM | Admit: 2012-06-07 | Discharge: 2012-06-07 | Disposition: A | Discharge status: Home / Self Care | Payer: Medicare Other | Attending: Emergency Medicine | Admitting: Emergency Medicine

## 2012-06-07 DIAGNOSIS — R52 Pain, unspecified: Secondary | ICD-10-CM | POA: Insufficient documentation

## 2012-06-07 DIAGNOSIS — R1110 Vomiting, unspecified: Secondary | ICD-10-CM | POA: Diagnosis not present

## 2012-06-07 DIAGNOSIS — B338 Other specified viral diseases: Secondary | ICD-10-CM | POA: Diagnosis not present

## 2012-06-07 DIAGNOSIS — J40 Bronchitis, not specified as acute or chronic: Secondary | ICD-10-CM | POA: Diagnosis not present

## 2012-06-07 DIAGNOSIS — R059 Cough, unspecified: Secondary | ICD-10-CM | POA: Insufficient documentation

## 2012-06-07 DIAGNOSIS — Z79899 Other long term (current) drug therapy: Secondary | ICD-10-CM | POA: Insufficient documentation

## 2012-06-07 DIAGNOSIS — R509 Fever, unspecified: Secondary | ICD-10-CM | POA: Diagnosis not present

## 2012-06-07 DIAGNOSIS — Z87891 Personal history of nicotine dependence: Secondary | ICD-10-CM | POA: Insufficient documentation

## 2012-06-07 DIAGNOSIS — R5381 Other malaise: Secondary | ICD-10-CM | POA: Diagnosis not present

## 2012-06-07 DIAGNOSIS — I10 Essential (primary) hypertension: Secondary | ICD-10-CM | POA: Insufficient documentation

## 2012-06-07 DIAGNOSIS — B9789 Other viral agents as the cause of diseases classified elsewhere: Secondary | ICD-10-CM | POA: Diagnosis not present

## 2012-06-07 DIAGNOSIS — B349 Viral infection, unspecified: Secondary | ICD-10-CM

## 2012-06-07 DIAGNOSIS — R112 Nausea with vomiting, unspecified: Secondary | ICD-10-CM | POA: Insufficient documentation

## 2012-06-07 DIAGNOSIS — Z7902 Long term (current) use of antithrombotics/antiplatelets: Secondary | ICD-10-CM | POA: Insufficient documentation

## 2012-06-07 DIAGNOSIS — R404 Transient alteration of awareness: Secondary | ICD-10-CM | POA: Diagnosis not present

## 2012-06-07 DIAGNOSIS — Z7982 Long term (current) use of aspirin: Secondary | ICD-10-CM | POA: Insufficient documentation

## 2012-06-07 DIAGNOSIS — E785 Hyperlipidemia, unspecified: Secondary | ICD-10-CM | POA: Insufficient documentation

## 2012-06-07 DIAGNOSIS — R05 Cough: Secondary | ICD-10-CM | POA: Insufficient documentation

## 2012-06-07 DIAGNOSIS — F411 Generalized anxiety disorder: Secondary | ICD-10-CM | POA: Diagnosis not present

## 2012-06-07 DIAGNOSIS — I2510 Atherosclerotic heart disease of native coronary artery without angina pectoris: Secondary | ICD-10-CM | POA: Insufficient documentation

## 2012-06-07 LAB — CBC WITH DIFFERENTIAL/PLATELET
Basophils Absolute: 0 10*3/uL (ref 0.0–0.1)
Eosinophils Relative: 2 % (ref 0–5)
HCT: 37.2 % (ref 36.0–46.0)
Lymphocytes Relative: 3 % — ABNORMAL LOW (ref 12–46)
Lymphs Abs: 0.3 10*3/uL — ABNORMAL LOW (ref 0.7–4.0)
MCH: 29.8 pg (ref 26.0–34.0)
MCV: 88.6 fL (ref 78.0–100.0)
Monocytes Absolute: 0.4 10*3/uL (ref 0.1–1.0)
RDW: 13.8 % (ref 11.5–15.5)
WBC: 9.6 10*3/uL (ref 4.0–10.5)

## 2012-06-07 LAB — POCT I-STAT, CHEM 8
Creatinine, Ser: 0.9 mg/dL (ref 0.50–1.10)
Glucose, Bld: 131 mg/dL — ABNORMAL HIGH (ref 70–99)
HCT: 37 % (ref 36.0–46.0)
Hemoglobin: 12.6 g/dL (ref 12.0–15.0)
TCO2: 25 mmol/L (ref 0–100)

## 2012-06-07 LAB — URINE MICROSCOPIC-ADD ON

## 2012-06-07 LAB — URINALYSIS, ROUTINE W REFLEX MICROSCOPIC
Bilirubin Urine: NEGATIVE
Glucose, UA: NEGATIVE mg/dL
Ketones, ur: NEGATIVE mg/dL
pH: 7 (ref 5.0–8.0)

## 2012-06-07 MED ORDER — ACETAMINOPHEN 325 MG PO TABS
650.0000 mg | ORAL_TABLET | Freq: Once | ORAL | Status: AC
  Administered 2012-06-07: 650 mg via ORAL
  Filled 2012-06-07: qty 2

## 2012-06-07 MED ORDER — OSELTAMIVIR PHOSPHATE 75 MG PO CAPS
75.0000 mg | ORAL_CAPSULE | Freq: Once | ORAL | Status: AC
  Administered 2012-06-07: 75 mg via ORAL
  Filled 2012-06-07: qty 1

## 2012-06-07 MED ORDER — ONDANSETRON HCL 4 MG/2ML IJ SOLN
4.0000 mg | Freq: Once | INTRAMUSCULAR | Status: AC
  Administered 2012-06-07: 4 mg via INTRAVENOUS
  Filled 2012-06-07: qty 2

## 2012-06-07 MED ORDER — OSELTAMIVIR PHOSPHATE 75 MG PO CAPS: 75.0000 mg | ORAL_CAPSULE | Freq: Two times a day (BID) | ORAL | Status: DC

## 2012-06-07 NOTE — ED Provider Notes (Signed)
[name] is a 72 y.o. female with one day of myalgias, and ill feeling. Mild, associated cough. No shortness of breath, chest pain, weakness, or dizziness. She's completed treatment for UTI with Macrobid Exam alert, calm, cooperative. She's not in respiratory distress. Vital signs are reassuring  Chest x-ray does not show pneumonia.  Treatment started in the ED with Tamiflu.  Diagnosis influenza-like illness.  Plan: Tamiflu and symptomatic treatments. Urine culture ordered.   Medical screening examination/treatment/procedure(s) were conducted as a shared visit with non-physician practitioner(s) and myself.  I personally evaluated the patient during the encounter  Flint Melter, MD 06/07/12 1651

## 2012-06-07 NOTE — ED Provider Notes (Signed)
History     CSN: [id number]  Arrival date & time 06/07/12  0102   First MD Initiated Contact with Patient 06/07/12 1025      Chief Complaint  Patient presents with  . Fever  . Generalized Body Aches    (Consider location/radiation/quality/duration/timing/severity/associated sxs/prior treatment) HPI Comments: Pt states that she had onset of fever of 102, cough and generalized myalgias and weakness:pt states that she vomited times one  Patient is a 72 y.o. female presenting with fever. The history is provided by the patient. No language interpreter was used.  Fever Primary symptoms of the febrile illness include fever, cough, nausea and vomiting. Primary symptoms do not include abdominal pain, diarrhea, dysuria or rash. The current episode started yesterday. This is a new problem. The problem has not changed since onset.   Past Medical History  Diagnosis Date  . Hypertension   . Hyperlipidemia   . Fatigue   . Anxiety   . Coronary artery disease     a. s/p MI 2009 - PCI/DES distal  RCA w/ 2.75 x 18 Xience DES;  b. 05/2010 Myoview: Apical thinning, EF 73%;  c. 06/2010 Cath: moderate nonobs dzs, EF 65%  . Fibromyalgia   . Essential iris atrophy     Right side    Past Surgical History  Procedure Date  . Cardiac catheterization 06/29/2010    Mild to moderate coronary artery irregularities.  Her  proximal left anterior descending artery is moderately narrowed.  She  also has an eccentric stenosis in the proximal LAD.  These do not appear  to obstruct flow at present, but they are fairly small vessels.  They  appeared to be between 1.5 and 2 mm in diamete  . Cardiac catheterization 08/02/2009    Patent stent  . Cardiac catheterization 05/12/2008    Stent to the distal RCA    Family History  Problem Relation Age of Onset  . Heart attack Mother   . Coronary artery disease Brother     had CABG  . Coronary artery disease Brother   . Coronary artery disease Brother   . Coronary  artery disease Brother   . Coronary artery disease Brother   . Heart attack Brother     History  Substance Use Topics  . Smoking status: Former Smoker    Quit date: 09/13/1960  . Smokeless tobacco: Never Used  . Alcohol Use: No    OB History    Grav Para Term Preterm Abortions TAB SAB Ect Mult Living                  Review of Systems  Constitutional: Positive for fever.  Respiratory: Positive for cough.   Cardiovascular: Negative.   Gastrointestinal: Positive for nausea and vomiting. Negative for abdominal pain and diarrhea.  Genitourinary: Negative for dysuria.  Skin: Negative for rash.    Allergies  Codeine and Cortisone  Home Medications   Current Outpatient Rx  Name  Route  Sig  Dispense  Refill  . AMLODIPINE BESYLATE 5 MG PO TABS   Oral   Take 1 tablet (5 mg total) by mouth daily.   30 tablet   11   . ASPIRIN 325 MG PO TABS   Oral   Take 325 mg by mouth daily.           Marland Kitchen CARVEDILOL 12.5 MG PO TABS   Oral   Take 1 tablet (12.5 mg total) by mouth 2 (two) times daily.   60 tablet  11   . CLOPIDOGREL BISULFATE 75 MG PO TABS      TAKE ONE TABLET BY MOUTH DAILY   90 tablet   3   . HYDROCHLOROTHIAZIDE 25 MG PO TABS   Oral   Take 25 mg by mouth daily.          . IRON PO   Oral   Take 1 tablet by mouth daily.           Marland Kitchen LISINOPRIL 40 MG PO TABS   Oral   Take 1 tablet (40 mg total) by mouth daily.   90 tablet   3   . METHIMAZOLE 5 MG PO TABS   Oral   Take 5 mg by mouth daily.         Marland Kitchen NITROFURANTOIN MONOHYD MACRO 100 MG PO CAPS   Oral   Take 1 capsule (100 mg total) by mouth 2 (two) times daily.   14 capsule   0   . NITROGLYCERIN 0.4 MG SL SUBL   Sublingual   Place 0.4 mg under the tongue every 5 (five) minutes as needed.           Marland Kitchen PANTOPRAZOLE SODIUM 40 MG PO TBEC   Oral   Take 40 mg by mouth 2 (two) times daily.          Marland Kitchen PRAMIPEXOLE DIHYDROCHLORIDE 0.25 MG PO TABS   Oral   Take 0.25 mg by mouth at bedtime.           Marland Kitchen ROSUVASTATIN CALCIUM 10 MG PO TABS   Oral   Take 1 tablet (10 mg total) by mouth daily.   90 tablet   3   . TRAMADOL HCL 50 MG PO TABS   Oral   Take 50 mg by mouth 3 (three) times daily as needed.          . VENLAFAXINE HCL 75 MG PO TABS   Oral   Take 75 mg by mouth daily. 1/2 tablet 2 (two) times daily           BP 185/57  Pulse 99  Temp 99.2 F (37.3 C) (Oral)  Resp 20  SpO2 93%  Physical Exam  Nursing note and vitals reviewed. Constitutional: She is oriented to person, place, and time. She appears well-developed and well-nourished.  HENT:  Right Ear: External ear normal.  Left Ear: External ear normal.  Eyes: Conjunctivae normal and EOM are normal.  Cardiovascular: Normal rate and regular rhythm.   Pulmonary/Chest: She has rales.  Abdominal: Soft. Bowel sounds are normal. There is no tenderness.  Musculoskeletal: Normal range of motion.  Neurological: She is alert and oriented to person, place, and time. Coordination normal.  Skin: Skin is warm and dry.  Psychiatric: She has a normal mood and affect.    ED Course  Procedures (including critical care time)  Labs Reviewed  CBC WITH DIFFERENTIAL - Abnormal; Notable for the following:    Neutrophils Relative 91 (*)     Neutro Abs 8.7 (*)     Lymphocytes Relative 3 (*)     Lymphs Abs 0.3 (*)     All other components within normal limits  URINALYSIS, ROUTINE W REFLEX MICROSCOPIC - Abnormal; Notable for the following:    Leukocytes, UA SMALL (*)     All other components within normal limits  POCT I-STAT, CHEM 8 - Abnormal; Notable for the following:    Glucose, Bld 131 (*)     All other components within normal  limits  URINE MICROSCOPIC-ADD ON  URINE CULTURE   Dg Chest 2 View  06/07/2012  *RADIOLOGY REPORT*  Clinical Data: Cough, fever, chills, body aches  CHEST - 2 VIEW  Comparison: 08/28/2010; 12/09/2009; 08/04/2008  Findings: Grossly unchanged cardiac silhouette and mediastinal contours with  atherosclerotic calcifications within the thoracic aorta.  The lungs appear hyperexpanded with flattening of bilateral hemidiaphragms and mild thickening of the pulmonary interstitium. No focal airspace opacities.  No pleural effusion or pneumothorax. Unchanged bones.  IMPRESSION: Mild bronchitic change without acute cardiopulmonary disease.   Original Report Authenticated By: Tacey Ruiz, MD      1. Viral illness       MDM  Will treat for influenza:pt is tolerating po without any problem:pts vitals are stable:pt is okay to follow up or return for worsening symptoms        Teressa Lower, NP 06/07/12 1433

## 2012-06-07 NOTE — ED Notes (Signed)
[id number]<AV> Expected date:06/07/12<BR> Expected time:10:03 AM<BR> Means of arrival:Ambulance<BR> Comments:<BR> Flu like symptoms

## 2012-06-07 NOTE — ED Notes (Signed)
Per EMS pt c/o flu like symptoms since yesterday afternoon, pt has body aches, nausea/vomiting, fever.

## 2012-06-08 LAB — URINE CULTURE

## 2012-08-08 DIAGNOSIS — H21269 Iris atrophy (essential) (progressive), unspecified eye: Secondary | ICD-10-CM | POA: Diagnosis not present

## 2012-08-13 ENCOUNTER — Ambulatory Visit (INDEPENDENT_AMBULATORY_CARE_PROVIDER_SITE_OTHER): Payer: Medicare Other | Admitting: Family Medicine

## 2012-08-13 VITALS — BP 142/70 | HR 69 | Temp 98.1°F | Resp 16 | Ht 67.5 in | Wt 206.0 lb

## 2012-08-13 DIAGNOSIS — N3090 Cystitis, unspecified without hematuria: Secondary | ICD-10-CM | POA: Diagnosis not present

## 2012-08-13 DIAGNOSIS — R300 Dysuria: Secondary | ICD-10-CM

## 2012-08-13 DIAGNOSIS — N390 Urinary tract infection, site not specified: Secondary | ICD-10-CM

## 2012-08-13 DIAGNOSIS — N3091 Cystitis, unspecified with hematuria: Secondary | ICD-10-CM

## 2012-08-13 LAB — POCT URINALYSIS DIPSTICK
Nitrite, UA: NEGATIVE
Protein, UA: 100
Urobilinogen, UA: 0.2
pH, UA: 5.5

## 2012-08-13 LAB — POCT UA - MICROSCOPIC ONLY
Casts, Ur, LPF, POC: NEGATIVE
Mucus, UA: NEGATIVE
Yeast, UA: NEGATIVE

## 2012-08-13 MED ORDER — PHENAZOPYRIDINE HCL 100 MG PO TABS: 100.0000 mg | ORAL_TABLET | Freq: Three times a day (TID) | ORAL | Status: DC | PRN

## 2012-08-13 MED ORDER — CIPROFLOXACIN HCL 250 MG PO TABS: 250.0000 mg | ORAL_TABLET | Freq: Two times a day (BID) | ORAL | Status: DC

## 2012-08-13 NOTE — Patient Instructions (Signed)
Recheck for repeat Urine test in next 2 weeks.  Return to the clinic or go to the nearest emergency room if any of your symptoms worsen or new symptoms occur.  Urinary Tract Infection Urinary tract infections (UTIs) can develop anywhere along your urinary tract. Your urinary tract is your body's drainage system for removing wastes and extra water. Your urinary tract includes two kidneys, two ureters, a bladder, and a urethra. Your kidneys are a pair of bean-shaped organs. Each kidney is about the size of your fist. They are located below your ribs, one on each side of your spine. CAUSES Infections are caused by microbes, which are microscopic organisms, including fungi, viruses, and bacteria. These organisms are so small that they can only be seen through a microscope. Bacteria are the microbes that most commonly cause UTIs. SYMPTOMS  Symptoms of UTIs may vary by age and gender of the patient and by the location of the infection. Symptoms in young women typically include a frequent and intense urge to urinate and a painful, burning feeling in the bladder or urethra during urination. Older women and men are more likely to be tired, shaky, and weak and have muscle aches and abdominal pain. A fever may mean the infection is in your kidneys. Other symptoms of a kidney infection include pain in your back or sides below the ribs, nausea, and vomiting. DIAGNOSIS To diagnose a UTI, your caregiver will ask you about your symptoms. Your caregiver also will ask to provide a urine sample. The urine sample will be tested for bacteria and white blood cells. White blood cells are made by your body to help fight infection. TREATMENT  Typically, UTIs can be treated with medication. Because most UTIs are caused by a bacterial infection, they usually can be treated with the use of antibiotics. The choice of antibiotic and length of treatment depend on your symptoms and the type of bacteria causing your infection. HOME  CARE INSTRUCTIONS  If you were prescribed antibiotics, take them exactly as your caregiver instructs you. Finish the medication even if you feel better after you have only taken some of the medication.  Drink enough water and fluids to keep your urine clear or pale yellow.  Avoid caffeine, tea, and carbonated beverages. They tend to irritate your bladder.  Empty your bladder often. Avoid holding urine for long periods of time.  Empty your bladder before and after sexual intercourse.  After a bowel movement, women should cleanse from front to back. Use each tissue only once. SEEK MEDICAL CARE IF:   You have back pain.  You develop a fever.  Your symptoms do not begin to resolve within 3 days. SEEK IMMEDIATE MEDICAL CARE IF:   You have severe back pain or lower abdominal pain.  You develop chills.  You have nausea or vomiting.  You have continued burning or discomfort with urination. MAKE SURE YOU:   Understand these instructions.  Will watch your condition.  Will get help right away if you are not doing well or get worse. Document Released: 02/14/2005 Document Revised: 11/06/2011 Document Reviewed: 06/15/2011 [facility] Patient Information 2013 [place], [place].

## 2012-08-13 NOTE — Progress Notes (Signed)
Subjective:    Patient ID: [name], female    DOB: [birth year], 72 y.o.   MRN: [id number]  HPI [name] is a 72 y.o. female  Started with dysuria last night, and frequency/urgency.  Noted blood in urine later today.  Persistent dysuria.   No fever. No nausea/vomiting.  No otc treatments. Drinking fluids ok.   Review of Systems  Constitutional: Negative for fever and chills.  Gastrointestinal: Positive for abdominal pain. Negative for nausea and vomiting.       Lower abdomen/bladder area  Genitourinary: Positive for dysuria, urgency, frequency and hematuria. Negative for vaginal bleeding, vaginal discharge, difficulty urinating, vaginal pain, menstrual problem and pelvic pain.  Musculoskeletal: Negative for back pain.  Skin: Negative for rash.   As above.     Objective:   Physical Exam  Vitals reviewed. Constitutional: She is oriented to person, place, and time. She appears well-developed and well-nourished.  HENT:  Head: Normocephalic and atraumatic.  Pulmonary/Chest: Effort normal.  Abdominal: Soft. Normal appearance. She exhibits no distension. There is tenderness in the suprapubic area. There is no rebound, no guarding and no CVA tenderness.  Neurological: She is alert and oriented to person, place, and time.  Skin: Skin is warm.  Psychiatric: She has a normal mood and affect. Her behavior is normal.      Results for orders placed in visit on 08/13/12  POCT URINALYSIS DIPSTICK      Result Value Range   Color, UA dark yellow     Clarity, UA cloudy     Glucose, UA neg     Bilirubin, UA neg     Ketones, UA neg     Spec Grav, UA 1.025     Blood, UA large     pH, UA 5.5     Protein, UA 100     Urobilinogen, UA 0.2     Nitrite, UA neg     Leukocytes, UA large (3+)    POCT UA - MICROSCOPIC ONLY      Result Value Range   WBC, Ur, HPF, POC tntc     RBC, urine, microscopic tntc     Bacteria, U Microscopic trace     Mucus, UA neg     Epithelial cells,  urine per micros neg     Crystals, Ur, HPF, POC neg     Casts, Ur, LPF, POC neg     Yeast, UA neg         Assessment & Plan:  [name] is a 72 y.o. female Dysuria - Plan: POCT urinalysis dipstick, POCT UA - Microscopic Only, ciprofloxacin (CIPRO) 250 MG tablet, phenazopyridine (PYRIDIUM) 100 MG tablet, Urine culture  Hemorrhagic cystitis - Plan: ciprofloxacin (CIPRO) 250 MG tablet, phenazopyridine (PYRIDIUM) 100 MG tablet, Urine culture  Will treat with Cipro 250mg  BID for 1 week, prior hx of hemorrhagic cystitis in January. Check urine cx. Plan to follow up in next 2 weeks for repeat u/a and if hematuria persists - urology referral, or if persistently frequent UTI's may need urology eval. Understanding expressed.   Meds ordered this encounter  Medications  . ciprofloxacin (CIPRO) 250 MG tablet    Sig: Take 1 tablet (250 mg total) by mouth 2 (two) times daily.    Dispense:  14 tablet    Refill:  0  . phenazopyridine (PYRIDIUM) 100 MG tablet    Sig: Take 1 tablet (100 mg total) by mouth 3 (three) times daily as needed for pain.  Dispense:  10 tablet    Refill:  0   Patient Instructions  Recheck for repeat Urine test in next 2 weeks.  Return to the clinic or go to the nearest emergency room if any of your symptoms worsen or new symptoms occur.  Urinary Tract Infection Urinary tract infections (UTIs) can develop anywhere along your urinary tract. Your urinary tract is your body's drainage system for removing wastes and extra water. Your urinary tract includes two kidneys, two ureters, a bladder, and a urethra. Your kidneys are a pair of bean-shaped organs. Each kidney is about the size of your fist. They are located below your ribs, one on each side of your spine. CAUSES Infections are caused by microbes, which are microscopic organisms, including fungi, viruses, and bacteria. These organisms are so small that they can only be seen through a microscope. Bacteria are the  microbes that most commonly cause UTIs. SYMPTOMS  Symptoms of UTIs may vary by age and gender of the patient and by the location of the infection. Symptoms in young women typically include a frequent and intense urge to urinate and a painful, burning feeling in the bladder or urethra during urination. Older women and men are more likely to be tired, shaky, and weak and have muscle aches and abdominal pain. A fever may mean the infection is in your kidneys. Other symptoms of a kidney infection include pain in your back or sides below the ribs, nausea, and vomiting. DIAGNOSIS To diagnose a UTI, your caregiver will ask you about your symptoms. Your caregiver also will ask to provide a urine sample. The urine sample will be tested for bacteria and white blood cells. White blood cells are made by your body to help fight infection. TREATMENT  Typically, UTIs can be treated with medication. Because most UTIs are caused by a bacterial infection, they usually can be treated with the use of antibiotics. The choice of antibiotic and length of treatment depend on your symptoms and the type of bacteria causing your infection. HOME CARE INSTRUCTIONS  If you were prescribed antibiotics, take them exactly as your caregiver instructs you. Finish the medication even if you feel better after you have only taken some of the medication.  Drink enough water and fluids to keep your urine clear or pale yellow.  Avoid caffeine, tea, and carbonated beverages. They tend to irritate your bladder.  Empty your bladder often. Avoid holding urine for long periods of time.  Empty your bladder before and after sexual intercourse.  After a bowel movement, women should cleanse from front to back. Use each tissue only once. SEEK MEDICAL CARE IF:   You have back pain.  You develop a fever.  Your symptoms do not begin to resolve within 3 days. SEEK IMMEDIATE MEDICAL CARE IF:   You have severe back pain or lower abdominal  pain.  You develop chills.  You have nausea or vomiting.  You have continued burning or discomfort with urination. MAKE SURE YOU:   Understand these instructions.  Will watch your condition.  Will get help right away if you are not doing well or get worse. Document Released: 02/14/2005 Document Revised: 11/06/2011 Document Reviewed: 06/15/2011 [facility] Patient Information 2013 [place], [place].

## 2012-08-17 LAB — URINE CULTURE: Colony Count: 35000

## 2012-08-18 ENCOUNTER — Other Ambulatory Visit: Payer: Self-pay | Admitting: Family Medicine

## 2012-08-18 DIAGNOSIS — N390 Urinary tract infection, site not specified: Secondary | ICD-10-CM

## 2012-08-18 DIAGNOSIS — N3091 Cystitis, unspecified with hematuria: Secondary | ICD-10-CM

## 2012-08-18 MED ORDER — SULFAMETHOXAZOLE-TRIMETHOPRIM 800-160 MG PO TABS: 1.0000 | ORAL_TABLET | Freq: Two times a day (BID) | ORAL | Status: DC

## 2012-09-02 DIAGNOSIS — N951 Menopausal and female climacteric states: Secondary | ICD-10-CM | POA: Diagnosis not present

## 2012-09-10 DIAGNOSIS — H4050X0 Glaucoma secondary to other eye disorders, unspecified eye, stage unspecified: Secondary | ICD-10-CM | POA: Diagnosis not present

## 2012-09-10 DIAGNOSIS — H44429 Hypotony of unspecified eye due to ocular fistula: Secondary | ICD-10-CM | POA: Diagnosis not present

## 2012-09-19 ENCOUNTER — Other Ambulatory Visit: Payer: Self-pay | Admitting: *Deleted

## 2012-09-24 ENCOUNTER — Other Ambulatory Visit: Payer: Self-pay | Admitting: *Deleted

## 2012-09-24 MED ORDER — LISINOPRIL 40 MG PO TABS: 40.0000 mg | ORAL_TABLET | Freq: Every day | ORAL | Status: DC

## 2012-09-24 NOTE — Telephone Encounter (Signed)
Fax Received. Refill Completed. [name] (R.M.A)  Pt is aware of appointment and locations.

## 2012-09-30 DIAGNOSIS — F411 Generalized anxiety disorder: Secondary | ICD-10-CM | POA: Diagnosis not present

## 2012-09-30 DIAGNOSIS — M1990 Unspecified osteoarthritis, unspecified site: Secondary | ICD-10-CM | POA: Diagnosis not present

## 2012-09-30 DIAGNOSIS — G2589 Other specified extrapyramidal and movement disorders: Secondary | ICD-10-CM | POA: Diagnosis not present

## 2012-09-30 DIAGNOSIS — B079 Viral wart, unspecified: Secondary | ICD-10-CM | POA: Diagnosis not present

## 2012-09-30 DIAGNOSIS — K219 Gastro-esophageal reflux disease without esophagitis: Secondary | ICD-10-CM | POA: Diagnosis not present

## 2012-09-30 DIAGNOSIS — R300 Dysuria: Secondary | ICD-10-CM | POA: Diagnosis not present

## 2012-09-30 DIAGNOSIS — I10 Essential (primary) hypertension: Secondary | ICD-10-CM | POA: Diagnosis not present

## 2012-09-30 DIAGNOSIS — E0590 Thyrotoxicosis, unspecified without thyrotoxic crisis or storm: Secondary | ICD-10-CM | POA: Diagnosis not present

## 2012-10-02 DIAGNOSIS — N3020 Other chronic cystitis without hematuria: Secondary | ICD-10-CM | POA: Diagnosis not present

## 2012-10-02 DIAGNOSIS — R310 Gross hematuria: Secondary | ICD-10-CM | POA: Diagnosis not present

## 2012-10-02 DIAGNOSIS — N393 Stress incontinence (female) (male): Secondary | ICD-10-CM | POA: Diagnosis not present

## 2012-10-08 ENCOUNTER — Encounter: Payer: Self-pay | Admitting: Cardiovascular Disease

## 2012-10-08 ENCOUNTER — Ambulatory Visit (INDEPENDENT_AMBULATORY_CARE_PROVIDER_SITE_OTHER): Payer: Medicare Other | Admitting: Cardiovascular Disease

## 2012-10-08 VITALS — BP 116/68 | HR 62 | Ht 67.5 in | Wt 202.8 lb

## 2012-10-08 DIAGNOSIS — I2510 Atherosclerotic heart disease of native coronary artery without angina pectoris: Secondary | ICD-10-CM

## 2012-10-08 NOTE — Progress Notes (Signed)
Patient Name: [name] Date of Encounter: 10/08/2012  Primary Care Provider:  Benita Stabile, MD Primary Cardiologist:  Katherina Right, MD  Problem List   Past Medical History  Diagnosis Date  . Hypertension   . Hyperlipidemia   . Fatigue   . Anxiety   . Coronary artery disease     a. s/p MI 2009 - PCI/DES distal  RCA w/ 2.75 x 18 Xience DES;  b. 05/2010 Myoview: Apical thinning, EF 73%;  c. 06/2010 Cath: moderate nonobs dzs, EF 65%  . Fibromyalgia   . Essential iris atrophy     Right side   Past Surgical History  Procedure Laterality Date  . Cardiac catheterization  06/29/2010    Mild to moderate coronary artery irregularities.  Her  proximal left anterior descending artery is moderately narrowed.  She  also has an eccentric stenosis in the proximal LAD.  These do not appear  to obstruct flow at present, but they are fairly small vessels.  They  appeared to be between 1.5 and 2 mm in diamete  . Cardiac catheterization  08/02/2009    Patent stent  . Cardiac catheterization  05/12/2008    Stent to the distal RCA    Allergies  Allergies  Allergen Reactions  . Codeine Nausea And Vomiting  . Cortisone Nausea Only and Other (See Comments)    Reaction:Hot flashes Patients MD researched and found a few that cause less side effects for patient    HPI  September 07, 2011:  71 year old female with the above problem list.  She was in usual state of health about 3 weeks ago when she began to note mild lower extremity edema with bilateral ankle and calf pain with ambulation.  She takes her left leg has been more swollen than the right.  She has been relatively active recently partaking and gardening without any chest pain or dyspnea.  Her weight is actually down from baseline.  She's seen no change in her salt intake or amt of time that her legs might be in a dependent position.  She's quite flustered and anxious today.  She misplaced her phone and her dtr initially dropped her off  @ the old [place] cardiology office and she had to walk down the street to here.  She doesn't know how to reach her dtr to have her come down and pick her up.  She's tearful and emotionally upset.  In that setting, after the visit, pt said that she thinks she's having chest pain.  An ecg was done, and before it was even completed, c/p resolved, as did her emotional upset.  Oct 08, 2012:  [name] is doing well. She's not had any episodes of chest pain or shortness of breath. She was seen by our PA last year. She has a history of coronary artery disease but has not had any recurrent angina. She's been working out in her garden without difficulties.  She has occasional episodes of orthostatic hypotension and also notes some occasional leg edema. I suspect this is from the amlodipine.  Home Medications  Prior to Admission medications   Medication Sig Start Date End Date Taking? Authorizing Provider  acetaminophen (TYLENOL) 500 MG tablet Take 500 mg by mouth as needed.    Yes Historical Provider, MD  amitriptyline (ELAVIL) 10 MG tablet Take 10 mg by mouth at bedtime.   Yes Historical Provider, MD  amLODipine (NORVASC) 5 MG tablet Take 1 tablet (5 mg total) by mouth daily. 09/03/11 09/02/12  Yes Vesta Mixer, MD  aspirin 325 MG tablet Take 325 mg by mouth daily.     Yes Historical Provider, MD  carvedilol (COREG) 12.5 MG tablet Take 1 tablet (12.5 mg total) by mouth 2 (two) times daily. 09/03/11 09/02/12 Yes Vesta Mixer, MD  clopidogrel (PLAVIX) 75 MG tablet TAKE ONE TABLET BY MOUTH DAILY 07/06/11  Yes Rosalio Macadamia, NP  diclofenac sodium (VOLTAREN) 1 % GEL Apply topically. **CREAM** use as directed    Yes Historical Provider, MD  fluticasone (FLONASE) 50 MCG/ACT nasal spray Place 1 spray into the nose daily.   Yes Historical Provider, MD  hydrochlorothiazide 25 MG tablet Take 25 mg by mouth daily.    Yes Historical Provider, MD  HYDROcodone-homatropine (HYCODAN) 5-1.5 MG/5ML syrup Take 5 mLs by mouth  every 8 (eight) hours as needed for cough. 08/29/11 09/08/11 Yes Elvina Sidle, MD  IRON PO Take 1 tablet by mouth daily.     Yes Historical Provider, MD  levofloxacin (LEVAQUIN) 500 MG tablet Take 1 tablet (500 mg total) by mouth daily. 08/29/11 09/08/11 Yes Elvina Sidle, MD  methimazole (TAPAZOLE) 5 MG tablet Take 5 mg by mouth daily.   Yes Historical Provider, MD  nitroGLYCERIN (NITROSTAT) 0.4 MG SL tablet Place 0.4 mg under the tongue every 5 (five) minutes as needed.     Yes Historical Provider, MD  pantoprazole (PROTONIX) 40 MG tablet Take 40 mg by mouth 2 (two) times daily.    Yes Historical Provider, MD  potassium chloride (KLOR-CON) 10 MEQ CR tablet Take 1 tablet (10 mEq total) by mouth daily. 02/23/11  Yes Vesta Mixer, MD  pramipexole (MIRAPEX) 0.25 MG tablet Take 1 mg by mouth at bedtime.    Yes Historical Provider, MD  rosuvastatin (CRESTOR) 10 MG tablet Take 1 tablet (10 mg total) by mouth daily. 06/06/11  Yes Vesta Mixer, MD  traMADol (ULTRAM) 50 MG tablet Take 50 mg by mouth 3 (three) times daily as needed.    Yes Historical Provider, MD  venlafaxine (EFFEXOR) 75 MG tablet Take 75 mg by mouth daily. 1/2 tablet 2 (two) times daily   Yes Historical Provider, MD  lisinopril (PRINIVIL,ZESTRIL) 40 MG tablet Take 1 tablet (40 mg total) by mouth daily. 09/07/11 09/06/12  Vesta Mixer, MD     Physical Exam  Blood pressure 116/68, pulse 62, height 5' 7.5" (1.715 m), weight 202 lb 12.8 oz (91.989 kg).  General: Pleasant, NAD Psych: Normal affect. Neuro: Alert and oriented X 3. Moves all extremities spontaneously. HEENT: Normal  Neck: Supple without bruits or JVD. Lungs:  Resp regular and unlabored, CTA. Heart: RRR no s3, s4. 1/6 sem lusb. Abdomen: Soft, non-tender, non-distended, BS + x 4.  Extremities: No clubbing, cyanosis or edema.  Legs are tender to palpation. DP/PT/Radials 2+ and equal bilaterally.  ECG  Oct 08, 2012:  NSR at 62, normal ECG

## 2012-10-08 NOTE — Patient Instructions (Addendum)
Your physician wants you to follow-up in: 1 year  You will receive a reminder letter in the mail two months in advance. If you don't receive a letter, please call our office to schedule the follow-up appointment.   Your physician recommends that you continue on your current medications as directed. Please refer to the Current Medication list given to you today.  Your physician recommends that you return for a FASTING lipid profile: 1 year

## 2012-10-08 NOTE — Assessment & Plan Note (Signed)
She is doing well.  No angina.  She is having right eye surgery next week.  Her stents were placed in 2009.  It will be ok for her to hold plavix for 5 days prior to eye surgery .  She should continue the asa 81.    We'll continue with her same medications. I'll see her in one year.   We'll check fasting labs on her

## 2012-10-09 ENCOUNTER — Telehealth: Payer: Self-pay | Admitting: Cardiovascular Disease

## 2012-10-09 ENCOUNTER — Emergency Department (HOSPITAL_COMMUNITY)
Admission: EM | Admit: 2012-10-09 | Discharge: 2012-10-10 | Disposition: A | Discharge status: Home / Self Care | Payer: Medicare Other | Attending: Emergency Medicine | Admitting: Emergency Medicine

## 2012-10-09 DIAGNOSIS — E785 Hyperlipidemia, unspecified: Secondary | ICD-10-CM | POA: Diagnosis not present

## 2012-10-09 DIAGNOSIS — Z8669 Personal history of other diseases of the nervous system and sense organs: Secondary | ICD-10-CM | POA: Insufficient documentation

## 2012-10-09 DIAGNOSIS — I2510 Atherosclerotic heart disease of native coronary artery without angina pectoris: Secondary | ICD-10-CM | POA: Insufficient documentation

## 2012-10-09 DIAGNOSIS — Z79899 Other long term (current) drug therapy: Secondary | ICD-10-CM | POA: Diagnosis not present

## 2012-10-09 DIAGNOSIS — J189 Pneumonia, unspecified organism: Secondary | ICD-10-CM | POA: Insufficient documentation

## 2012-10-09 DIAGNOSIS — F411 Generalized anxiety disorder: Secondary | ICD-10-CM | POA: Insufficient documentation

## 2012-10-09 DIAGNOSIS — I10 Essential (primary) hypertension: Secondary | ICD-10-CM | POA: Insufficient documentation

## 2012-10-09 DIAGNOSIS — Z7982 Long term (current) use of aspirin: Secondary | ICD-10-CM | POA: Diagnosis not present

## 2012-10-09 DIAGNOSIS — IMO0001 Reserved for inherently not codable concepts without codable children: Secondary | ICD-10-CM | POA: Insufficient documentation

## 2012-10-09 DIAGNOSIS — R51 Headache: Secondary | ICD-10-CM | POA: Diagnosis not present

## 2012-10-09 DIAGNOSIS — R6889 Other general symptoms and signs: Secondary | ICD-10-CM | POA: Diagnosis not present

## 2012-10-09 DIAGNOSIS — R509 Fever, unspecified: Secondary | ICD-10-CM | POA: Diagnosis not present

## 2012-10-09 MED ORDER — ASPIRIN 81 MG PO TABS: 81.0000 mg | ORAL_TABLET | Freq: Every morning | ORAL | Status: DC

## 2012-10-09 MED ORDER — ACETAMINOPHEN 325 MG PO TABS
650.0000 mg | ORAL_TABLET | Freq: Once | ORAL | Status: AC
  Administered 2012-10-09: 650 mg via ORAL
  Filled 2012-10-09: qty 2

## 2012-10-09 NOTE — Telephone Encounter (Signed)
New Prob     Needs clearance to cut aspirin dosage to 81 mg prior to surgery. Fax number: [phone]

## 2012-10-09 NOTE — Telephone Encounter (Signed)
WILL FAX TO NUMBER PROVIDED

## 2012-10-09 NOTE — Telephone Encounter (Signed)
Yes, reduce ASA to 81 mg.

## 2012-10-09 NOTE — Telephone Encounter (Signed)
New problem    Pt wanted to inform dr Elease Hashimoto that [facility] nurse told her that she has a heart murmur and she didn't think dr Elease Hashimoto was aware of this because he didn't mention it when she saw him yesterday

## 2012-10-09 NOTE — ED Provider Notes (Signed)
History     CSN: [id number]  Arrival date & time 10/09/12  2253   First MD Initiated Contact with Patient 10/09/12 2306      Chief Complaint  Patient presents with  . Fever and Chills     (Consider location/radiation/quality/duration/timing/severity/associated sxs/prior treatment) HPI This is a 72 year old female with a history of frequent urinary tract infections. She was started on Macrobid today and has a CT scheduled for tomorrow. She is here with fever to 101.5 associated with chills, body aches, headache and nausea but no vomiting or diarrhea. It is also associated with pain in her bladder but not burning with urination. The symptoms are similar to previous urinary tract infections. She is also having back pain but states this is a chronic issue. She has atrophy of the right iris and is scheduled for eye surgery next week. Her symptoms are moderate. She has not taken anything for the fever.  Past Medical History  Diagnosis Date  . Hypertension   . Hyperlipidemia   . Fatigue   . Anxiety   . Coronary artery disease     a. s/p MI 2009 - PCI/DES distal  RCA w/ 2.75 x 18 Xience DES;  b. 05/2010 Myoview: Apical thinning, EF 73%;  c. 06/2010 Cath: moderate nonobs dzs, EF 65%  . Fibromyalgia   . Essential iris atrophy     Right side    Past Surgical History  Procedure Laterality Date  . Cardiac catheterization  06/29/2010    Mild to moderate coronary artery irregularities.  Her  proximal left anterior descending artery is moderately narrowed.  She  also has an eccentric stenosis in the proximal LAD.  These do not appear  to obstruct flow at present, but they are fairly small vessels.  They  appeared to be between 1.5 and 2 mm in diamete  . Cardiac catheterization  08/02/2009    Patent stent  . Cardiac catheterization  05/12/2008    Stent to the distal RCA    Family History  Problem Relation Age of Onset  . Heart attack Mother   . Coronary artery disease Brother     had CABG  .  Coronary artery disease Brother   . Coronary artery disease Brother   . Coronary artery disease Brother   . Coronary artery disease Brother   . Heart attack Brother     History  Substance Use Topics  . Smoking status: Former Smoker    Quit date: 09/13/1960  . Smokeless tobacco: Never Used  . Alcohol Use: No    OB History   Grav Para Term Preterm Abortions TAB SAB Ect Mult Living                  Review of Systems  All other systems reviewed and are negative.    Allergies  Codeine and Cortisone  Home Medications   Current Outpatient Rx  Name  Route  Sig  Dispense  Refill  . amLODipine (NORVASC) 5 MG tablet   Oral   Take 5 mg by mouth every morning.         Marland Kitchen aspirin 325 MG tablet   Oral   Take 325 mg by mouth daily.         . carvedilol (COREG) 12.5 MG tablet   Oral   Take 1 tablet (12.5 mg total) by mouth 2 (two) times daily.   60 tablet   11   . ferrous sulfate 325 (65 FE) MG tablet  Oral   Take 325 mg by mouth daily with breakfast.         . fluticasone (FLONASE) 50 MCG/ACT nasal spray   Nasal   Place 1 spray into the nose daily.         Marland Kitchen lisinopril (PRINIVIL,ZESTRIL) 40 MG tablet   Oral   Take 1 tablet (40 mg total) by mouth daily.   30 tablet   0     No refills until appointment 10-08-12   . methimazole (TAPAZOLE) 5 MG tablet   Oral   Take 5 mg by mouth every morning.          . nitrofurantoin, macrocrystal-monohydrate, (MACROBID) 100 MG capsule   Oral   Take 100 mg by mouth 2 (two) times daily. Stop date:10/16/12         . pantoprazole (PROTONIX) 40 MG tablet   Oral   Take 40 mg by mouth 2 (two) times daily.          . pramipexole (MIRAPEX) 1 MG tablet   Oral   Take 1 mg by mouth at bedtime.         . rosuvastatin (CRESTOR) 10 MG tablet   Oral   Take 10 mg by mouth every morning.         . traMADol (ULTRAM) 50 MG tablet   Oral   Take 50 mg by mouth 3 (three) times daily as needed. For pain         .  venlafaxine (EFFEXOR) 75 MG tablet   Oral   Take 75 mg by mouth daily.         . nitroGLYCERIN (NITROSTAT) 0.4 MG SL tablet   Sublingual   Place 0.4 mg under the tongue every 5 (five) minutes x 3 doses as needed. For chest pain           BP 174/55  Pulse 87  Temp(Src) 99.9 F (37.7 C) (Oral)  Resp 18  SpO2 99%  Physical Exam General: Well-developed, well-nourished female in no acute distress; appearance consistent with age of record HENT: normocephalic, atraumatic Eyes: Left pupil round and reactive to light; right pupil irregular; extraocular muscles intact Neck: supple Heart: regular rate and rhythm Lungs: clear to auscultation bilaterally Abdomen: soft; nondistended; suprapubic tenderness; bowel sounds present GU: No CVA tenderness Extremities: No deformity; full range of motion; pulses normal Neurologic: Awake, alert and oriented; motor function intact in all extremities and symmetric; no facial droop Skin: Warm and dry Psychiatric: Normal mood and affect    ED Course  Procedures (including critical care time)    MDM   Nursing notes and vitals signs, including pulse oximetry, reviewed.  Summary of this visit's results, reviewed by myself:  Labs:  Results for orders placed during the hospital encounter of 10/09/12 (from the past 24 hour(s))  URINALYSIS, ROUTINE W REFLEX MICROSCOPIC     Status: Abnormal   Collection Time    10/09/12 11:37 PM      Result Value Range   Color, Urine YELLOW  YELLOW   APPearance CLEAR  CLEAR   Specific Gravity, Urine 1.020  1.005 - 1.030   pH 5.0  5.0 - 8.0   Glucose, UA NEGATIVE  NEGATIVE mg/dL   Hgb urine dipstick NEGATIVE  NEGATIVE   Bilirubin Urine NEGATIVE  NEGATIVE   Ketones, ur NEGATIVE  NEGATIVE mg/dL   Protein, ur NEGATIVE  NEGATIVE mg/dL   Urobilinogen, UA 0.2  0.0 - 1.0 mg/dL   Nitrite NEGATIVE  NEGATIVE  Leukocytes, UA SMALL (*) NEGATIVE  URINE MICROSCOPIC-ADD ON     Status: None   Collection Time     10/09/12 11:37 PM      Result Value Range   Squamous Epithelial / LPF RARE  RARE   WBC, UA 0-2  <3 WBC/hpf   RBC / HPF 0-2  <3 RBC/hpf  CBC WITH DIFFERENTIAL     Status: Abnormal   Collection Time    10/10/12 12:45 AM      Result Value Range   WBC 9.4  4.0 - 10.5 K/uL   RBC 3.97  3.87 - 5.11 MIL/uL   Hemoglobin 11.6 (*) 12.0 - 15.0 g/dL   HCT 57.8 (*) 46.9 - 62.9 %   MCV 88.4  78.0 - 100.0 fL   MCH 29.2  26.0 - 34.0 pg   MCHC 33.0  30.0 - 36.0 g/dL   RDW 52.8  41.3 - 24.4 %   Platelets 192  150 - 400 K/uL   Neutrophils Relative % 90 (*) 43 - 77 %   Neutro Abs 8.4 (*) 1.7 - 7.7 K/uL   Lymphocytes Relative 5 (*) 12 - 46 %   Lymphs Abs 0.5 (*) 0.7 - 4.0 K/uL   Monocytes Relative 3  3 - 12 %   Monocytes Absolute 0.2  0.1 - 1.0 K/uL   Eosinophils Relative 2  0 - 5 %   Eosinophils Absolute 0.2  0.0 - 0.7 K/uL   Basophils Relative 0  0 - 1 %   Basophils Absolute 0.0  0.0 - 0.1 K/uL  BASIC METABOLIC PANEL     Status: Abnormal   Collection Time    10/10/12 12:45 AM      Result Value Range   Sodium 137  135 - 145 mEq/L   Potassium 3.9  3.5 - 5.1 mEq/L   Chloride 101  96 - 112 mEq/L   CO2 24  19 - 32 mEq/L   Glucose, Bld 116 (*) 70 - 99 mg/dL   BUN 14  6 - 23 mg/dL   Creatinine, Ser 0.10  0.50 - 1.10 mg/dL   Calcium 9.6  8.4 - 27.2 mg/dL   GFR calc non Af Amer 62 (*) >90 mL/min   GFR calc Af Amer 72 (*) >90 mL/min    Imaging Studies: Dg Chest 2 View  10/10/2012   *RADIOLOGY REPORT*  Clinical Data: Fever and chills.  CHEST - 2 VIEW  Comparison: 06/07/2012  Findings: New infiltrate is seen within the lateral aspect of the lingula, consistent with pneumonia.  Right lung is clear.  No evidence of pleural effusion.  Heart size is normal.  No mass or lymphadenopathy identified.  IMPRESSION: New lingular infiltrate, consistent with pneumonia.   Original Report Authenticated By: Myles Rosenthal, M.D.    Pneumonia Severity Index Score 41, Class I, Mortality 0.1%  We will treat with Levaquin  as patient does have risk factors for resistance. She has tolerated Levaquin in the past.        Hanley Seamen, MD 10/10/12 [phone]

## 2012-10-09 NOTE — Telephone Encounter (Signed)
She has a soft murmur as noted in my note.  I do not think it is serious.  The echo in 2009 was basically normal.  We can repeat the echo if she wants to.

## 2012-10-09 NOTE — ED Notes (Signed)
Per EMS, pt being treated for UTI, started ABX today,was told to come to ED if her fever went above 101

## 2012-10-09 NOTE — ED Notes (Signed)
ZOX:[id number]<AV> Expected date:10/09/12<BR> Expected time:10:41 PM<BR> Means of arrival:Ambulance<BR> Comments:<BR> Fever, UTI  72 yo F

## 2012-10-09 NOTE — Telephone Encounter (Signed)
Please advise if pt can reduce asa to 81 mg.

## 2012-10-10 ENCOUNTER — Encounter (HOSPITAL_COMMUNITY): Payer: Self-pay | Admitting: Emergency Medicine

## 2012-10-10 ENCOUNTER — Telehealth: Payer: Self-pay | Admitting: Cardiovascular Disease

## 2012-10-10 ENCOUNTER — Emergency Department (HOSPITAL_COMMUNITY): Payer: Medicare Other

## 2012-10-10 DIAGNOSIS — J189 Pneumonia, unspecified organism: Secondary | ICD-10-CM | POA: Diagnosis not present

## 2012-10-10 LAB — URINALYSIS, ROUTINE W REFLEX MICROSCOPIC
Bilirubin Urine: NEGATIVE
Ketones, ur: NEGATIVE mg/dL
Nitrite: NEGATIVE
pH: 5 (ref 5.0–8.0)

## 2012-10-10 LAB — CBC WITH DIFFERENTIAL/PLATELET
Basophils Absolute: 0 10*3/uL (ref 0.0–0.1)
Basophils Relative: 0 % (ref 0–1)
Eosinophils Absolute: 0.2 10*3/uL (ref 0.0–0.7)
Eosinophils Relative: 2 % (ref 0–5)
MCH: 29.2 pg (ref 26.0–34.0)
MCV: 88.4 fL (ref 78.0–100.0)
Platelets: 192 10*3/uL (ref 150–400)
RDW: 13.9 % (ref 11.5–15.5)
WBC: 9.4 10*3/uL (ref 4.0–10.5)

## 2012-10-10 LAB — BASIC METABOLIC PANEL
Calcium: 9.6 mg/dL (ref 8.4–10.5)
GFR calc non Af Amer: 62 mL/min — ABNORMAL LOW (ref >90)
Sodium: 137 mEq/L (ref 135–145)

## 2012-10-10 LAB — URINE MICROSCOPIC-ADD ON

## 2012-10-10 MED ORDER — LEVOFLOXACIN 750 MG PO TABS: ORAL_TABLET | ORAL | Status: DC

## 2012-10-10 MED ORDER — LEVOFLOXACIN 500 MG PO TABS
750.0000 mg | ORAL_TABLET | Freq: Once | ORAL | Status: AC
  Administered 2012-10-10: 750 mg via ORAL
  Filled 2012-10-10: qty 2

## 2012-10-10 MED ORDER — SODIUM CHLORIDE 0.9 % IV SOLN
INTRAVENOUS | Status: DC
  Administered 2012-10-10: 01:00:00 via INTRAVENOUS

## 2012-10-10 NOTE — Telephone Encounter (Signed)
New problem   [name] stated pt is having eye sx 10/14/12 and need to know if pt Asprin can be adjusted.please call [name] by 12noon she will be leaving the office early.

## 2012-10-10 NOTE — Telephone Encounter (Signed)
Spoke with Clydie Braun, regarding Dr.Nahser to reduce Asprin. Clearance to reduce ASA to 81 mg daily faxed to Clydie Braun to [phone]

## 2012-10-11 LAB — URINE CULTURE: Colony Count: NO GROWTH

## 2012-10-14 DIAGNOSIS — M7520 Bicipital tendinitis, unspecified shoulder: Secondary | ICD-10-CM | POA: Diagnosis not present

## 2012-10-14 DIAGNOSIS — M25519 Pain in unspecified shoulder: Secondary | ICD-10-CM | POA: Diagnosis not present

## 2012-10-14 DIAGNOSIS — M7710 Lateral epicondylitis, unspecified elbow: Secondary | ICD-10-CM | POA: Diagnosis not present

## 2012-10-14 NOTE — Telephone Encounter (Signed)
Pt made aware she has been followed by echo for her murmur. 2011, 2009, pt verbalized understanding.

## 2012-10-30 ENCOUNTER — Other Ambulatory Visit: Payer: Self-pay | Admitting: Cardiovascular Disease

## 2012-10-30 DIAGNOSIS — M19019 Primary osteoarthritis, unspecified shoulder: Secondary | ICD-10-CM | POA: Diagnosis not present

## 2012-10-31 ENCOUNTER — Encounter (HOSPITAL_COMMUNITY): Payer: Self-pay | Admitting: Emergency Medicine

## 2012-10-31 ENCOUNTER — Emergency Department (HOSPITAL_COMMUNITY)
Admission: EM | Admit: 2012-10-31 | Discharge: 2012-10-31 | Disposition: A | Discharge status: Home / Self Care | Payer: Medicare Other | Attending: Emergency Medicine | Admitting: Emergency Medicine

## 2012-10-31 ENCOUNTER — Telehealth: Payer: Self-pay | Admitting: Cardiovascular Disease

## 2012-10-31 ENCOUNTER — Emergency Department (HOSPITAL_COMMUNITY): Payer: Medicare Other

## 2012-10-31 DIAGNOSIS — R05 Cough: Secondary | ICD-10-CM | POA: Insufficient documentation

## 2012-10-31 DIAGNOSIS — Z9889 Other specified postprocedural states: Secondary | ICD-10-CM | POA: Insufficient documentation

## 2012-10-31 DIAGNOSIS — R509 Fever, unspecified: Secondary | ICD-10-CM | POA: Insufficient documentation

## 2012-10-31 DIAGNOSIS — Z8701 Personal history of pneumonia (recurrent): Secondary | ICD-10-CM | POA: Diagnosis not present

## 2012-10-31 DIAGNOSIS — E785 Hyperlipidemia, unspecified: Secondary | ICD-10-CM | POA: Diagnosis not present

## 2012-10-31 DIAGNOSIS — IMO0002 Reserved for concepts with insufficient information to code with codable children: Secondary | ICD-10-CM | POA: Insufficient documentation

## 2012-10-31 DIAGNOSIS — R51 Headache: Secondary | ICD-10-CM | POA: Diagnosis not present

## 2012-10-31 DIAGNOSIS — R6889 Other general symptoms and signs: Secondary | ICD-10-CM | POA: Diagnosis not present

## 2012-10-31 DIAGNOSIS — Z8669 Personal history of other diseases of the nervous system and sense organs: Secondary | ICD-10-CM | POA: Diagnosis not present

## 2012-10-31 DIAGNOSIS — I2510 Atherosclerotic heart disease of native coronary artery without angina pectoris: Secondary | ICD-10-CM | POA: Insufficient documentation

## 2012-10-31 DIAGNOSIS — R059 Cough, unspecified: Secondary | ICD-10-CM | POA: Diagnosis not present

## 2012-10-31 DIAGNOSIS — Z7982 Long term (current) use of aspirin: Secondary | ICD-10-CM | POA: Diagnosis not present

## 2012-10-31 DIAGNOSIS — Z872 Personal history of diseases of the skin and subcutaneous tissue: Secondary | ICD-10-CM | POA: Insufficient documentation

## 2012-10-31 DIAGNOSIS — M25519 Pain in unspecified shoulder: Secondary | ICD-10-CM | POA: Diagnosis not present

## 2012-10-31 DIAGNOSIS — Z79899 Other long term (current) drug therapy: Secondary | ICD-10-CM | POA: Diagnosis not present

## 2012-10-31 DIAGNOSIS — M791 Myalgia, unspecified site: Secondary | ICD-10-CM

## 2012-10-31 DIAGNOSIS — I10 Essential (primary) hypertension: Secondary | ICD-10-CM | POA: Diagnosis not present

## 2012-10-31 DIAGNOSIS — F411 Generalized anxiety disorder: Secondary | ICD-10-CM | POA: Insufficient documentation

## 2012-10-31 DIAGNOSIS — IMO0001 Reserved for inherently not codable concepts without codable children: Secondary | ICD-10-CM | POA: Diagnosis not present

## 2012-10-31 DIAGNOSIS — Z87891 Personal history of nicotine dependence: Secondary | ICD-10-CM | POA: Insufficient documentation

## 2012-10-31 DIAGNOSIS — R52 Pain, unspecified: Secondary | ICD-10-CM | POA: Diagnosis not present

## 2012-10-31 DIAGNOSIS — R110 Nausea: Secondary | ICD-10-CM | POA: Diagnosis not present

## 2012-10-31 DIAGNOSIS — Z87440 Personal history of urinary (tract) infections: Secondary | ICD-10-CM | POA: Insufficient documentation

## 2012-10-31 LAB — COMPREHENSIVE METABOLIC PANEL
Albumin: 3.4 g/dL — ABNORMAL LOW (ref 3.5–5.2)
BUN: 19 mg/dL (ref 6–23)
Creatinine, Ser: 0.89 mg/dL (ref 0.50–1.10)
Total Bilirubin: 0.3 mg/dL (ref 0.3–1.2)
Total Protein: 7.6 g/dL (ref 6.0–8.3)

## 2012-10-31 LAB — URINALYSIS, ROUTINE W REFLEX MICROSCOPIC
Glucose, UA: NEGATIVE mg/dL
Leukocytes, UA: NEGATIVE
Nitrite: NEGATIVE
Protein, ur: NEGATIVE mg/dL
pH: 5 (ref 5.0–8.0)

## 2012-10-31 LAB — CBC WITH DIFFERENTIAL/PLATELET
Basophils Relative: 0 % (ref 0–1)
Eosinophils Absolute: 0.4 10*3/uL (ref 0.0–0.7)
HCT: 36 % (ref 36.0–46.0)
Hemoglobin: 12.1 g/dL (ref 12.0–15.0)
MCH: 29.2 pg (ref 26.0–34.0)
MCHC: 33.6 g/dL (ref 30.0–36.0)
Monocytes Absolute: 0.4 10*3/uL (ref 0.1–1.0)
Monocytes Relative: 3 % (ref 3–12)

## 2012-10-31 MED ORDER — TRAMADOL HCL 50 MG PO TABS
50.0000 mg | ORAL_TABLET | Freq: Once | ORAL | Status: AC
  Administered 2012-10-31: 50 mg via ORAL
  Filled 2012-10-31: qty 1

## 2012-10-31 MED ORDER — HYDROCODONE-ACETAMINOPHEN 5-325 MG PO TABS: 2.0000 | ORAL_TABLET | ORAL | Status: DC | PRN

## 2012-10-31 MED ORDER — KETOROLAC TROMETHAMINE 30 MG/ML IJ SOLN
30.0000 mg | Freq: Once | INTRAMUSCULAR | Status: AC
  Administered 2012-10-31: 30 mg via INTRAVENOUS
  Filled 2012-10-31: qty 1

## 2012-10-31 MED ORDER — NAPROXEN 500 MG PO TABS: 500.0000 mg | ORAL_TABLET | Freq: Two times a day (BID) | ORAL | Status: DC

## 2012-10-31 NOTE — ED Notes (Signed)
ZOX:[id number]<AV> Expected date:10/31/12<BR> Expected time: 7:20 PM<BR> Means of arrival:Ambulance<BR> Comments:<BR> Body aches, cough, flu like sxs 72 yo F

## 2012-10-31 NOTE — Telephone Encounter (Signed)
Left message to call back. Will forward to [employer]

## 2012-10-31 NOTE — ED Notes (Signed)
Per Ems:  The patient has had generalized weakness and pain x 1 - 1/2 week. Patient reports that she has nausea with productive cough. The patient also reports that she has a fever. The patient has had a tramadol for pain and right shoulder pain.

## 2012-10-31 NOTE — ED Provider Notes (Signed)
History     CSN: [id number]  Arrival date & time 10/31/12  4098   First MD Initiated Contact with Patient 10/31/12 1942      Chief Complaint  Patient presents with  . Cough  . Fever    (Consider location/radiation/quality/duration/timing/severity/associated sxs/prior treatment) HPI Comments: 72 year old female who presents with a complaint of body aches, subjective fevers, shoulder pain. She has intermittent problems with her history of fibromyalgia that she does get frequent pain exacerbations. She feels that when she gets infections the pain gets worse though she presents for evaluation today. Her symptoms started in the last 2 days, they are persistent, nothing makes it better or worse, associated with occasional cough but no dysuria diarrhea or rashes. She denies abdominal pain or focal back pain but does have diffuse body aches and headache. Her mental status is normal, vision is normal, gait is normal, no numbness or weakness. Review of the medical record shows that the patient has been diagnosed with frequent infections over the last year including cellulitis, pneumonia, urinary infection.  Patient is a 72 y.o. female presenting with cough and fever. The history is provided by the patient and medical records.  Cough Associated symptoms: fever   Fever Associated symptoms: cough     Past Medical History  Diagnosis Date  . Hypertension   . Hyperlipidemia   . Fatigue   . Anxiety   . Coronary artery disease     a. s/p MI 2009 - PCI/DES distal  RCA w/ 2.75 x 18 Xience DES;  b. 05/2010 Myoview: Apical thinning, EF 73%;  c. 06/2010 Cath: moderate nonobs dzs, EF 65%  . Fibromyalgia   . Essential iris atrophy     Right side    Past Surgical History  Procedure Laterality Date  . Cardiac catheterization  06/29/2010    Mild to moderate coronary artery irregularities.  Her  proximal left anterior descending artery is moderately narrowed.  She  also has an eccentric stenosis in the  proximal LAD.  These do not appear  to obstruct flow at present, but they are fairly small vessels.  They  appeared to be between 1.5 and 2 mm in diamete  . Cardiac catheterization  08/02/2009    Patent stent  . Cardiac catheterization  05/12/2008    Stent to the distal RCA    Family History  Problem Relation Age of Onset  . Heart attack Mother   . Coronary artery disease Brother     had CABG  . Coronary artery disease Brother   . Coronary artery disease Brother   . Coronary artery disease Brother   . Coronary artery disease Brother   . Heart attack Brother     History  Substance Use Topics  . Smoking status: Former Smoker    Quit date: 09/13/1960  . Smokeless tobacco: Never Used  . Alcohol Use: No    OB History   Grav Para Term Preterm Abortions TAB SAB Ect Mult Living                  Review of Systems  Constitutional: Positive for fever.  Respiratory: Positive for cough.   All other systems reviewed and are negative.    Allergies  Codeine and Cortisone  Home Medications   Current Outpatient Rx  Name  Route  Sig  Dispense  Refill  . amLODipine (NORVASC) 5 MG tablet   Oral   Take 5 mg by mouth every morning.         Marland Kitchen  aspirin 325 MG tablet   Oral   Take 325 mg by mouth every morning.          . carvedilol (COREG) 12.5 MG tablet   Oral   Take 1 tablet (12.5 mg total) by mouth 2 (two) times daily.   60 tablet   11   . clopidogrel (PLAVIX) 75 MG tablet   Oral   Take 75 mg by mouth every morning.         Marland Kitchen doxycycline (VIBRA-TABS) 100 MG tablet   Oral   Take 100 mg by mouth 2 (two) times daily.         . ferrous sulfate 325 (65 FE) MG tablet   Oral   Take 325 mg by mouth every evening.          . fluticasone (FLONASE) 50 MCG/ACT nasal spray   Nasal   Place 1 spray into the nose daily as needed for rhinitis or allergies.          Marland Kitchen lisinopril (PRINIVIL,ZESTRIL) 40 MG tablet      TAKE 1 TABLET BY MOUTH EVERY DAY   30 tablet   0    . methimazole (TAPAZOLE) 5 MG tablet   Oral   Take 5 mg by mouth every morning.          . nitrofurantoin (MACRODANTIN) 100 MG capsule   Oral   Take 100 mg by mouth 2 (two) times daily.         . nitroGLYCERIN (NITROSTAT) 0.4 MG SL tablet   Sublingual   Place 0.4 mg under the tongue every 5 (five) minutes x 3 doses as needed. For chest pain         . pantoprazole (PROTONIX) 40 MG tablet   Oral   Take 40 mg by mouth 2 (two) times daily.          . pramipexole (MIRAPEX) 1 MG tablet   Oral   Take 1 mg by mouth at bedtime.         . rosuvastatin (CRESTOR) 10 MG tablet   Oral   Take 10 mg by mouth every morning.         . traMADol (ULTRAM) 50 MG tablet   Oral   Take 50 mg by mouth 3 (three) times daily as needed. For pain         . venlafaxine (EFFEXOR) 75 MG tablet   Oral   Take 32.5 mg by mouth 2 (two) times daily.          Marland Kitchen HYDROcodone-acetaminophen (NORCO/VICODIN) 5-325 MG per tablet   Oral   Take 2 tablets by mouth every 4 (four) hours as needed for pain.   10 tablet   0   . naproxen (NAPROSYN) 500 MG tablet   Oral   Take 1 tablet (500 mg total) by mouth 2 (two) times daily with a meal.   30 tablet   0     BP 159/47  Pulse 97  Temp(Src) 99.9 F (37.7 C) (Oral)  Resp 20  SpO2 98%  Physical Exam  Nursing note and vitals reviewed. Constitutional: She appears well-developed and well-nourished. No distress.  HENT:  Head: Normocephalic and atraumatic.  Mouth/Throat: Oropharynx is clear and moist. No oropharyngeal exudate.  Mucous membranes are moist  Eyes: Conjunctivae and EOM are normal. Pupils are equal, round, and reactive to light. Right eye exhibits no discharge. Left eye exhibits no discharge. No scleral icterus.  Neck: Normal range of motion. Neck  supple. No JVD present. No thyromegaly present.  Very supple neck, no stiffness  Cardiovascular: Normal rate, regular rhythm, normal heart sounds and intact distal pulses.  Exam reveals no  gallop and no friction rub.   No murmur heard. Pulmonary/Chest: Effort normal and breath sounds normal. No respiratory distress. She has no wheezes. She has no rales.  Abdominal: Soft. Bowel sounds are normal. She exhibits no distension and no mass. There is no tenderness.  Musculoskeletal: Normal range of motion. She exhibits tenderness ( Diffuse tenderness to palpation of the arms and legs and intact. No tenderness over the spine). She exhibits no edema.  Decreased range of motion of the right shoulder, patient states this is normal for her over the last several weeks, she has had cortisone injections in the shoulder in the past with improvement. No redness, no warmth, no joint effusions  Lymphadenopathy:    She has no cervical adenopathy.  Neurological: She is alert. Coordination normal.  Skin: Skin is warm and dry. No rash noted. No erythema.  Psychiatric: She has a normal mood and affect. Her behavior is normal.    ED Course  Procedures (including critical care time)  Labs Reviewed  CBC WITH DIFFERENTIAL - Abnormal; Notable for the following:    WBC 11.4 (*)    Neutrophils Relative % 87 (*)    Neutro Abs 9.9 (*)    Lymphocytes Relative 6 (*)    All other components within normal limits  COMPREHENSIVE METABOLIC PANEL - Abnormal; Notable for the following:    Glucose, Bld 138 (*)    Albumin 3.4 (*)    GFR calc non Af Amer 64 (*)    GFR calc Af Amer 74 (*)    All other components within normal limits  URINE CULTURE  URINALYSIS, ROUTINE W REFLEX MICROSCOPIC  POCT LACTIC ACID (LACTATE)   Dg Chest 2 View  10/31/2012   *RADIOLOGY REPORT*  Clinical Data: Cough.  Follow-up pneumonia.  CHEST - 2 VIEW  Comparison: 10/10/2012  Findings: Previously seen lingular infiltrate is no longer visualized.  Both lungs are clear.  No evidence of pleural effusion.  Heart size is stable within normal limits.  IMPRESSION: Resolution of lingular infiltrate.  No active disease.   Original Report  Authenticated By: Myles Rosenthal, M.D.     1. Myalgia       MDM  At this time the patient appears to have a nonspecific myalgias, this could be related to a viral syndrome but could also be related to her underlying fibromyalgia. Her white blood cell count is slightly elevated at 11,400, she will need a complete metabolic panel as well as a urinalysis. I personally interpreted her chest x-ray and find her to be no signs of infiltrate or pneumothorax.   Pt appears well, has tolerated crackers and peanut butter, x-ray shows no infections in fact it shows that her prior pneumonia has resolved and labwork is overall unremarkable with only a slight leukocytosis, no renal dysfunction, clean urinalysis which appears hydrated. Patient informed of her results, referred back to her family Dr. for further testing should her symptoms continue.   Meds given in ED:  Medications  traMADol (ULTRAM) tablet 50 mg (50 mg Oral Given 10/31/12 2048)  ketorolac (TORADOL) 30 MG/ML injection 30 mg (30 mg Intravenous Given 10/31/12 2113)    New Prescriptions   HYDROCODONE-ACETAMINOPHEN (NORCO/VICODIN) 5-325 MG PER TABLET    Take 2 tablets by mouth every 4 (four) hours as needed for pain.   NAPROXEN (  NAPROSYN) 500 MG TABLET    Take 1 tablet (500 mg total) by mouth 2 (two) times daily with a meal.      Vida Roller, MD 10/31/12 [phone]

## 2012-10-31 NOTE — Telephone Encounter (Signed)
New Prob     Pt wants to know what stent she had put in after her heart attack. She said she cant remember.Please call.

## 2012-11-02 LAB — URINE CULTURE: Colony Count: 30000

## 2012-11-03 ENCOUNTER — Encounter: Payer: Self-pay | Admitting: *Deleted

## 2012-11-03 NOTE — Telephone Encounter (Signed)
Xience 2.75- x 18-  mm drug-eluting stent, PT INFORMED

## 2012-11-05 DIAGNOSIS — IMO0001 Reserved for inherently not codable concepts without codable children: Secondary | ICD-10-CM | POA: Diagnosis not present

## 2012-11-06 DIAGNOSIS — N390 Urinary tract infection, site not specified: Secondary | ICD-10-CM | POA: Diagnosis not present

## 2012-11-06 DIAGNOSIS — R3989 Other symptoms and signs involving the genitourinary system: Secondary | ICD-10-CM | POA: Diagnosis not present

## 2012-11-06 DIAGNOSIS — N3020 Other chronic cystitis without hematuria: Secondary | ICD-10-CM | POA: Diagnosis not present

## 2012-11-06 DIAGNOSIS — R310 Gross hematuria: Secondary | ICD-10-CM | POA: Diagnosis not present

## 2012-11-06 DIAGNOSIS — N393 Stress incontinence (female) (male): Secondary | ICD-10-CM | POA: Diagnosis not present

## 2012-11-08 DIAGNOSIS — M19019 Primary osteoarthritis, unspecified shoulder: Secondary | ICD-10-CM | POA: Diagnosis not present

## 2012-11-09 ENCOUNTER — Encounter (HOSPITAL_COMMUNITY): Payer: Self-pay

## 2012-11-09 ENCOUNTER — Emergency Department (HOSPITAL_COMMUNITY)
Admission: EM | Admit: 2012-11-09 | Discharge: 2012-11-10 | Disposition: A | Discharge status: Home / Self Care | Payer: Medicare Other | Attending: Emergency Medicine | Admitting: Emergency Medicine

## 2012-11-09 DIAGNOSIS — Z87891 Personal history of nicotine dependence: Secondary | ICD-10-CM | POA: Diagnosis not present

## 2012-11-09 DIAGNOSIS — Z9861 Coronary angioplasty status: Secondary | ICD-10-CM | POA: Insufficient documentation

## 2012-11-09 DIAGNOSIS — Z79899 Other long term (current) drug therapy: Secondary | ICD-10-CM | POA: Diagnosis not present

## 2012-11-09 DIAGNOSIS — J984 Other disorders of lung: Secondary | ICD-10-CM | POA: Diagnosis not present

## 2012-11-09 DIAGNOSIS — R918 Other nonspecific abnormal finding of lung field: Secondary | ICD-10-CM

## 2012-11-09 DIAGNOSIS — I10 Essential (primary) hypertension: Secondary | ICD-10-CM | POA: Insufficient documentation

## 2012-11-09 DIAGNOSIS — E785 Hyperlipidemia, unspecified: Secondary | ICD-10-CM | POA: Diagnosis not present

## 2012-11-09 DIAGNOSIS — R5381 Other malaise: Secondary | ICD-10-CM | POA: Diagnosis not present

## 2012-11-09 DIAGNOSIS — Z7982 Long term (current) use of aspirin: Secondary | ICD-10-CM | POA: Insufficient documentation

## 2012-11-09 DIAGNOSIS — R509 Fever, unspecified: Secondary | ICD-10-CM | POA: Diagnosis not present

## 2012-11-09 DIAGNOSIS — F411 Generalized anxiety disorder: Secondary | ICD-10-CM | POA: Diagnosis not present

## 2012-11-09 DIAGNOSIS — I2510 Atherosclerotic heart disease of native coronary artery without angina pectoris: Secondary | ICD-10-CM | POA: Insufficient documentation

## 2012-11-09 DIAGNOSIS — Z8669 Personal history of other diseases of the nervous system and sense organs: Secondary | ICD-10-CM | POA: Diagnosis not present

## 2012-11-09 DIAGNOSIS — R05 Cough: Secondary | ICD-10-CM | POA: Diagnosis not present

## 2012-11-09 LAB — BASIC METABOLIC PANEL
BUN: 15 mg/dL (ref 6–23)
CO2: 27 mEq/L (ref 19–32)
Calcium: 9.8 mg/dL (ref 8.4–10.5)
Chloride: 100 mEq/L (ref 96–112)
Creatinine, Ser: 1.1 mg/dL (ref 0.50–1.10)
GFR calc Af Amer: 58 mL/min — ABNORMAL LOW (ref >90)
GFR calc non Af Amer: 50 mL/min — ABNORMAL LOW (ref >90)
Glucose, Bld: 134 mg/dL — ABNORMAL HIGH (ref 70–99)
Potassium: 3.9 mEq/L (ref 3.5–5.1)
Sodium: 135 mEq/L (ref 135–145)

## 2012-11-09 LAB — CBC WITH DIFFERENTIAL/PLATELET
Basophils Absolute: 0 10*3/uL (ref 0.0–0.1)
Basophils Relative: 0 % (ref 0–1)
Eosinophils Absolute: 0.2 10*3/uL (ref 0.0–0.7)
Eosinophils Relative: 2 % (ref 0–5)
HCT: 35.4 % — ABNORMAL LOW (ref 36.0–46.0)
Hemoglobin: 11.5 g/dL — ABNORMAL LOW (ref 12.0–15.0)
Lymphocytes Relative: 5 % — ABNORMAL LOW (ref 12–46)
Lymphs Abs: 0.8 10*3/uL (ref 0.7–4.0)
MCH: 28.5 pg (ref 26.0–34.0)
MCHC: 32.5 g/dL (ref 30.0–36.0)
MCV: 87.8 fL (ref 78.0–100.0)
Monocytes Absolute: 0.4 10*3/uL (ref 0.1–1.0)
Monocytes Relative: 2 % — ABNORMAL LOW (ref 3–12)
Neutro Abs: 14.4 10*3/uL — ABNORMAL HIGH (ref 1.7–7.7)
Neutrophils Relative %: 91 % — ABNORMAL HIGH (ref 43–77)
Platelets: 229 10*3/uL (ref 150–400)
RBC: 4.03 MIL/uL (ref 3.87–5.11)
RDW: 14.3 % (ref 11.5–15.5)
WBC: 15.8 10*3/uL — ABNORMAL HIGH (ref 4.0–10.5)

## 2012-11-09 MED ORDER — ONDANSETRON HCL 4 MG/2ML IJ SOLN: 4.0000 mg | Freq: Once | INTRAMUSCULAR | Status: DC

## 2012-11-09 MED ORDER — HYDROMORPHONE HCL PF 1 MG/ML IJ SOLN: 1.0000 mg | Freq: Once | INTRAMUSCULAR | Status: DC

## 2012-11-09 NOTE — ED Notes (Signed)
Per EMS- Patient c/o fever, chills, and body aches that started 2 hours ago. Patient reported to EMS that she has been seen in the ED x 4 for the same symptoms in the past month.

## 2012-11-09 NOTE — ED Notes (Signed)
Patient states she had a temperature at home and took a Vicodin prior to EMS arriving for shoulder pain. Patient c/o generalized body aches.

## 2012-11-10 ENCOUNTER — Emergency Department (HOSPITAL_COMMUNITY): Payer: Medicare Other

## 2012-11-10 DIAGNOSIS — J984 Other disorders of lung: Secondary | ICD-10-CM | POA: Diagnosis not present

## 2012-11-10 LAB — URINALYSIS, ROUTINE W REFLEX MICROSCOPIC
Bilirubin Urine: NEGATIVE
Glucose, UA: NEGATIVE mg/dL
Hgb urine dipstick: NEGATIVE
Ketones, ur: NEGATIVE mg/dL
Nitrite: NEGATIVE
Protein, ur: NEGATIVE mg/dL
Specific Gravity, Urine: 1.013 (ref 1.005–1.030)
Urobilinogen, UA: 0.2 mg/dL (ref 0.0–1.0)
pH: 6 (ref 5.0–8.0)

## 2012-11-10 LAB — URINE MICROSCOPIC-ADD ON

## 2012-11-10 MED ORDER — LEVOFLOXACIN 750 MG PO TABS: 750.0000 mg | ORAL_TABLET | Freq: Every day | ORAL | Status: DC

## 2012-11-10 MED ORDER — ACETAMINOPHEN 325 MG PO TABS: 650.0000 mg | ORAL_TABLET | Freq: Once | ORAL | Status: DC

## 2012-11-10 MED ORDER — DEXTROSE 5 % IV SOLN: 1.0000 g | Freq: Once | INTRAVENOUS | Status: DC

## 2012-11-10 MED ORDER — LEVOFLOXACIN IN D5W 750 MG/150ML IV SOLN
750.0000 mg | Freq: Once | INTRAVENOUS | Status: AC
  Administered 2012-11-10: 750 mg via INTRAVENOUS
  Filled 2012-11-10: qty 150

## 2012-11-10 NOTE — ED Provider Notes (Signed)
History     CSN: [id number]  Arrival date & time 11/09/12  1743   First MD Initiated Contact with Patient 11/09/12 2213      Chief Complaint  Patient presents with  . Fever  . Generalized Body Aches    (Consider location/radiation/quality/duration/timing/severity/associated sxs/prior treatment) HPI Patient presents emergency department with mild fever and bodyaches.  Patient, states, a month ago, she had pneumonia.  She states that since that time.  He said some intermittent fever and bodyaches cough.  Patient denies chest pain, shortness of breath, blurred vision, headache, numbness, weakness, back pain, vomiting, syncope, dizziness, or diarrhea.  The patient, states, that she did not take any medications prior to arrival.  Patient, states, that nothing seems to make her condition, better or worse. Past Medical History  Diagnosis Date  . Hypertension   . Hyperlipidemia   . Fatigue   . Anxiety   . Coronary artery disease     a. s/p MI 2009 - PCI/DES distal  RCA w/ 2.75 x 18 Xience DES;  b. 05/2010 Myoview: Apical thinning, EF 73%;  c. 06/2010 Cath: moderate nonobs dzs, EF 65%  . Fibromyalgia   . Essential iris atrophy     Right side    Past Surgical History  Procedure Laterality Date  . Cardiac catheterization  06/29/2010    Mild to moderate coronary artery irregularities.  Her  proximal left anterior descending artery is moderately narrowed.  She  also has an eccentric stenosis in the proximal LAD.  These do not appear  to obstruct flow at present, but they are fairly small vessels.  They  appeared to be between 1.5 and 2 mm in diamete  . Cardiac catheterization  08/02/2009    Patent stent  . Cardiac catheterization  05/12/2008    Stent to the distal RCA    Family History  Problem Relation Age of Onset  . Heart attack Mother   . Coronary artery disease Brother     had CABG  . Coronary artery disease Brother   . Coronary artery disease Brother   . Coronary artery disease  Brother   . Coronary artery disease Brother   . Heart attack Brother     History  Substance Use Topics  . Smoking status: Former Smoker    Quit date: 09/13/1960  . Smokeless tobacco: Never Used  . Alcohol Use: No    OB History   Grav Para Term Preterm Abortions TAB SAB Ect Mult Living                  Review of Systems All other systems negative except as documented in the HPI. All pertinent positives and negatives as reviewed in the HPI. Allergies  Codeine and Cortisone  Home Medications   Current Outpatient Rx  Name  Route  Sig  Dispense  Refill  . amLODipine (NORVASC) 5 MG tablet   Oral   Take 5 mg by mouth every morning.         Marland Kitchen aspirin EC 81 MG tablet   Oral   Take 81 mg by mouth every morning.         . carvedilol (COREG) 12.5 MG tablet   Oral   Take 1 tablet (12.5 mg total) by mouth 2 (two) times daily.   60 tablet   11   . clopidogrel (PLAVIX) 75 MG tablet   Oral   Take 75 mg by mouth every morning.         Marland Kitchen  ferrous sulfate 325 (65 FE) MG tablet   Oral   Take 325 mg by mouth every morning.          . fluticasone (FLONASE) 50 MCG/ACT nasal spray   Nasal   Place 1 spray into the nose daily as needed for rhinitis or allergies.          Marland Kitchen HYDROcodone-acetaminophen (NORCO/VICODIN) 5-325 MG per tablet   Oral   Take 2 tablets by mouth every 4 (four) hours as needed for pain.   10 tablet   0   . lisinopril (PRINIVIL,ZESTRIL) 40 MG tablet   Oral   Take 40 mg by mouth every morning.         . methimazole (TAPAZOLE) 5 MG tablet   Oral   Take 5 mg by mouth every morning.          . nitrofurantoin (MACRODANTIN) 100 MG capsule   Oral   Take 100 mg by mouth every morning.          . pantoprazole (PROTONIX) 40 MG tablet   Oral   Take 40 mg by mouth 2 (two) times daily.          . pramipexole (MIRAPEX) 1 MG tablet   Oral   Take 1 mg by mouth at bedtime.         . rosuvastatin (CRESTOR) 10 MG tablet   Oral   Take 10 mg by  mouth every morning.         . traMADol (ULTRAM) 50 MG tablet   Oral   Take 50 mg by mouth 3 (three) times daily as needed for pain. For pain         . venlafaxine (EFFEXOR) 75 MG tablet   Oral   Take 32.5 mg by mouth 2 (two) times daily.          . nitroGLYCERIN (NITROSTAT) 0.4 MG SL tablet   Sublingual   Place 0.4 mg under the tongue every 5 (five) minutes x 3 doses as needed for chest pain. For chest pain           BP 140/42  Pulse 84  Temp(Src) 98.7 F (37.1 C) (Oral)  Resp 18  Ht 5\' 6"  (1.676 m)  Wt 202 lb (91.627 kg)  BMI 32.62 kg/m2  SpO2 95%  Physical Exam  Nursing note and vitals reviewed. Constitutional: She is oriented to person, place, and time. She appears well-developed and well-nourished. No distress.  HENT:  Head: Normocephalic and atraumatic.  Mouth/Throat: Oropharynx is clear and moist.  Eyes: Pupils are equal, round, and reactive to light.  Neck: Normal range of motion. Neck supple.  Cardiovascular: Normal rate, regular rhythm and normal heart sounds.  Exam reveals no gallop and no friction rub.   No murmur heard. Pulmonary/Chest: Effort normal and breath sounds normal. No respiratory distress.  Abdominal: Soft. Bowel sounds are normal. She exhibits no distension.  Neurological: She is alert and oriented to person, place, and time.  Skin: Skin is warm and dry. No rash noted.    ED Course  Procedures (including critical care time)  Labs Reviewed  CBC WITH DIFFERENTIAL - Abnormal; Notable for the following:    WBC 15.8 (*)    Hemoglobin 11.5 (*)    HCT 35.4 (*)    Neutrophils Relative % 91 (*)    Neutro Abs 14.4 (*)    Lymphocytes Relative 5 (*)    Monocytes Relative 2 (*)    All other components within  normal limits  BASIC METABOLIC PANEL - Abnormal; Notable for the following:    Glucose, Bld 134 (*)    GFR calc non Af Amer 50 (*)    GFR calc Af Amer 58 (*)    All other components within normal limits  URINALYSIS, ROUTINE W REFLEX  MICROSCOPIC - Abnormal; Notable for the following:    Leukocytes, UA MODERATE (*)    All other components within normal limits  URINE MICROSCOPIC-ADD ON   Dg Chest 2 View  11/10/2012   *RADIOLOGY REPORT*  Clinical Data: Cough  CHEST - 2 VIEW  Comparison: 10/31/2012  Findings: Mild bibasilar airspace disease has progressed since the prior study and may be atelectasis or pneumonia.  Negative for heart failure or effusion.  Heart size is upper normal.  IMPRESSION: Mild bibasilar atelectasis/infiltrate.   Original Report Authenticated By: Janeece Riggers, M.D.   Patient retreated for this mild infiltrate/urinary possible infection.  Patient be referred back to her primary care, Dr. she's not had any vomiting here in the emergency department.  Patient tolerated oral fluids.  Patient is advised to return here for any worsening in her condition.  Patient is advised, that she'll need to finish her course of antibiotics.   MDM  MDM Reviewed: nursing note, vitals and previous chart Reviewed previous: labs and x-ray Interpretation: labs and x-ray            Carlyle Dolly, PA-C 11/10/12 0129

## 2012-11-10 NOTE — ED Provider Notes (Signed)
Medical screening examination/treatment/procedure(s) were performed by non-physician practitioner and as supervising physician I was immediately available for consultation/collaboration.   Amelita Risinger L Rafan Sanders, MD 11/10/12 0743 

## 2012-11-13 DIAGNOSIS — M7520 Bicipital tendinitis, unspecified shoulder: Secondary | ICD-10-CM | POA: Diagnosis not present

## 2012-11-17 ENCOUNTER — Other Ambulatory Visit: Payer: Self-pay | Admitting: Nurse Practitioner

## 2012-11-19 DIAGNOSIS — R5381 Other malaise: Secondary | ICD-10-CM | POA: Diagnosis not present

## 2012-12-04 ENCOUNTER — Other Ambulatory Visit: Payer: Self-pay | Admitting: Cardiovascular Disease

## 2012-12-05 DIAGNOSIS — I119 Hypertensive heart disease without heart failure: Secondary | ICD-10-CM | POA: Diagnosis not present

## 2012-12-05 DIAGNOSIS — Z5189 Encounter for other specified aftercare: Secondary | ICD-10-CM | POA: Diagnosis not present

## 2012-12-09 DIAGNOSIS — H44429 Hypotony of unspecified eye due to ocular fistula: Secondary | ICD-10-CM | POA: Diagnosis not present

## 2012-12-09 DIAGNOSIS — E669 Obesity, unspecified: Secondary | ICD-10-CM | POA: Diagnosis not present

## 2012-12-09 DIAGNOSIS — Z885 Allergy status to narcotic agent status: Secondary | ICD-10-CM | POA: Diagnosis not present

## 2012-12-09 DIAGNOSIS — Z6832 Body mass index (BMI) 32.0-32.9, adult: Secondary | ICD-10-CM | POA: Diagnosis not present

## 2012-12-09 DIAGNOSIS — E559 Vitamin D deficiency, unspecified: Secondary | ICD-10-CM | POA: Diagnosis not present

## 2012-12-09 DIAGNOSIS — I252 Old myocardial infarction: Secondary | ICD-10-CM | POA: Diagnosis not present

## 2012-12-09 DIAGNOSIS — D509 Iron deficiency anemia, unspecified: Secondary | ICD-10-CM | POA: Diagnosis not present

## 2012-12-09 DIAGNOSIS — Z7982 Long term (current) use of aspirin: Secondary | ICD-10-CM | POA: Diagnosis not present

## 2012-12-09 DIAGNOSIS — Z888 Allergy status to other drugs, medicaments and biological substances status: Secondary | ICD-10-CM | POA: Diagnosis not present

## 2012-12-09 DIAGNOSIS — K219 Gastro-esophageal reflux disease without esophagitis: Secondary | ICD-10-CM | POA: Diagnosis not present

## 2012-12-09 DIAGNOSIS — E785 Hyperlipidemia, unspecified: Secondary | ICD-10-CM | POA: Diagnosis not present

## 2012-12-09 DIAGNOSIS — IMO0001 Reserved for inherently not codable concepts without codable children: Secondary | ICD-10-CM | POA: Diagnosis not present

## 2012-12-09 DIAGNOSIS — I10 Essential (primary) hypertension: Secondary | ICD-10-CM | POA: Diagnosis not present

## 2012-12-09 DIAGNOSIS — E0590 Thyrotoxicosis, unspecified without thyrotoxic crisis or storm: Secondary | ICD-10-CM | POA: Diagnosis not present

## 2012-12-09 DIAGNOSIS — Z792 Long term (current) use of antibiotics: Secondary | ICD-10-CM | POA: Diagnosis not present

## 2012-12-09 DIAGNOSIS — Z87891 Personal history of nicotine dependence: Secondary | ICD-10-CM | POA: Diagnosis not present

## 2012-12-09 DIAGNOSIS — G4733 Obstructive sleep apnea (adult) (pediatric): Secondary | ICD-10-CM | POA: Diagnosis not present

## 2012-12-09 DIAGNOSIS — Z7902 Long term (current) use of antithrombotics/antiplatelets: Secondary | ICD-10-CM | POA: Diagnosis not present

## 2012-12-09 DIAGNOSIS — I2510 Atherosclerotic heart disease of native coronary artery without angina pectoris: Secondary | ICD-10-CM | POA: Diagnosis not present

## 2012-12-09 DIAGNOSIS — H34239 Retinal artery branch occlusion, unspecified eye: Secondary | ICD-10-CM | POA: Diagnosis not present

## 2012-12-17 DIAGNOSIS — H21269 Iris atrophy (essential) (progressive), unspecified eye: Secondary | ICD-10-CM | POA: Diagnosis not present

## 2012-12-23 ENCOUNTER — Other Ambulatory Visit: Payer: Self-pay | Admitting: *Deleted

## 2012-12-23 DIAGNOSIS — I2510 Atherosclerotic heart disease of native coronary artery without angina pectoris: Secondary | ICD-10-CM

## 2012-12-23 MED ORDER — ROSUVASTATIN CALCIUM 10 MG PO TABS: 10.0000 mg | ORAL_TABLET | Freq: Every morning | ORAL | Status: DC

## 2012-12-23 MED ORDER — CARVEDILOL 12.5 MG PO TABS: 12.5000 mg | ORAL_TABLET | Freq: Two times a day (BID) | ORAL | Status: DC

## 2012-12-24 ENCOUNTER — Other Ambulatory Visit: Payer: Self-pay

## 2012-12-24 DIAGNOSIS — M25519 Pain in unspecified shoulder: Secondary | ICD-10-CM | POA: Diagnosis not present

## 2012-12-24 DIAGNOSIS — M545 Low back pain: Secondary | ICD-10-CM | POA: Diagnosis not present

## 2013-01-05 DIAGNOSIS — M47817 Spondylosis without myelopathy or radiculopathy, lumbosacral region: Secondary | ICD-10-CM | POA: Diagnosis not present

## 2013-01-08 ENCOUNTER — Other Ambulatory Visit: Payer: Self-pay | Admitting: Cardiovascular Disease

## 2013-01-14 DIAGNOSIS — M545 Low back pain: Secondary | ICD-10-CM | POA: Diagnosis not present

## 2013-02-03 DIAGNOSIS — M47817 Spondylosis without myelopathy or radiculopathy, lumbosacral region: Secondary | ICD-10-CM | POA: Diagnosis not present

## 2013-03-12 ENCOUNTER — Other Ambulatory Visit: Payer: Self-pay | Admitting: Cardiovascular Disease

## 2013-03-26 ENCOUNTER — Other Ambulatory Visit: Payer: Self-pay

## 2013-03-26 DIAGNOSIS — H21269 Iris atrophy (essential) (progressive), unspecified eye: Secondary | ICD-10-CM | POA: Diagnosis not present

## 2013-04-02 DIAGNOSIS — J309 Allergic rhinitis, unspecified: Secondary | ICD-10-CM | POA: Diagnosis not present

## 2013-04-02 DIAGNOSIS — F411 Generalized anxiety disorder: Secondary | ICD-10-CM | POA: Diagnosis not present

## 2013-04-02 DIAGNOSIS — Z23 Encounter for immunization: Secondary | ICD-10-CM | POA: Diagnosis not present

## 2013-04-02 DIAGNOSIS — G2589 Other specified extrapyramidal and movement disorders: Secondary | ICD-10-CM | POA: Diagnosis not present

## 2013-04-02 DIAGNOSIS — K219 Gastro-esophageal reflux disease without esophagitis: Secondary | ICD-10-CM | POA: Diagnosis not present

## 2013-04-02 DIAGNOSIS — I10 Essential (primary) hypertension: Secondary | ICD-10-CM | POA: Diagnosis not present

## 2013-04-02 DIAGNOSIS — M1990 Unspecified osteoarthritis, unspecified site: Secondary | ICD-10-CM | POA: Diagnosis not present

## 2013-04-02 DIAGNOSIS — E0590 Thyrotoxicosis, unspecified without thyrotoxic crisis or storm: Secondary | ICD-10-CM | POA: Diagnosis not present

## 2013-06-02 ENCOUNTER — Ambulatory Visit: Payer: Medicare Other | Admitting: Nurse Practitioner

## 2013-06-03 ENCOUNTER — Ambulatory Visit: Payer: Medicare Other | Admitting: Cardiovascular Disease

## 2013-06-17 ENCOUNTER — Other Ambulatory Visit: Payer: Self-pay | Admitting: Cardiovascular Disease

## 2013-06-22 ENCOUNTER — Encounter: Payer: Self-pay | Admitting: Physician Assistant

## 2013-06-22 ENCOUNTER — Ambulatory Visit (INDEPENDENT_AMBULATORY_CARE_PROVIDER_SITE_OTHER): Payer: Medicare Other | Admitting: Physician Assistant

## 2013-06-22 VITALS — BP 140/64 | HR 55 | Ht 66.0 in | Wt 211.0 lb

## 2013-06-22 DIAGNOSIS — G4730 Sleep apnea, unspecified: Secondary | ICD-10-CM

## 2013-06-22 DIAGNOSIS — R5383 Other fatigue: Secondary | ICD-10-CM | POA: Diagnosis not present

## 2013-06-22 DIAGNOSIS — I10 Essential (primary) hypertension: Secondary | ICD-10-CM

## 2013-06-22 DIAGNOSIS — R079 Chest pain, unspecified: Secondary | ICD-10-CM

## 2013-06-22 DIAGNOSIS — E785 Hyperlipidemia, unspecified: Secondary | ICD-10-CM

## 2013-06-22 DIAGNOSIS — I2510 Atherosclerotic heart disease of native coronary artery without angina pectoris: Secondary | ICD-10-CM

## 2013-06-22 DIAGNOSIS — R5381 Other malaise: Secondary | ICD-10-CM | POA: Diagnosis not present

## 2013-06-22 DIAGNOSIS — E0590 Thyrotoxicosis, unspecified without thyrotoxic crisis or storm: Secondary | ICD-10-CM | POA: Diagnosis not present

## 2013-06-22 DIAGNOSIS — M549 Dorsalgia, unspecified: Secondary | ICD-10-CM

## 2013-06-22 NOTE — Patient Instructions (Signed)
Your physician recommends that you continue on your current medications as directed. Please refer to the Current Medication list given to you today.  Your physician has requested that you have a lexiscan myoview. For further information please visit [email or web address]. Please follow instruction sheet, as given.  Your physician recommends that you return for lab work [id number]; BMET, CBC W/DIFF, TSH  You have been referred to DR. TURNER DX SLEEP APNEA  Your physician wants you to follow-up in: [place] Acie Fredrickson. You will receive a reminder letter in the mail two months in advance. If you don't receive a letter, please call our office to schedule the follow-up appointment.

## 2013-06-22 NOTE — Progress Notes (Signed)
[address], [place], [place]  [address] Phone: [phone] Fax:  [phone]  Date:  06/22/2013   ID:  [name], DOB November 17, 1940, MRN [id number]  PCP:  Donnie Coffin, MD  Cardiologist:  Dr. Liam Rogers     History of Present Illness: [name] is a 73 y.o. female with a hx of CAD, s/p MI in 2009 tx with DES to dRCA, HTN, HL, fibromyalgia.  Echocardiogram (10/2009): Normal LV function, mild LVH, moderate aortic sclerosis, trace MR.  Lexiscan Myoview (05/2010):  Apical thinning, no ischemia, EF 73%.  LHC (06/2010): Proximal LAD 40-50% addendum 30-40%, mid RCA 50%, 30% prior to bifurcation, distal RCA stent patent, EF 65%.  Last seen by Dr. Acie Fredrickson 09/2012.    She presents for evaluation of fatigue.  She has felt this way for the last 2 mos.  She notes daytime hypersomnolence.  She does have sleep apnea.  She is not on CPAP as she could not tolerate this.  She feels her fatigue is getting worse.  There is probably an exertional component.  She had an episode of chest pain a few weeks ago at rest.  But, denies any exertional chest pain. No significant dyspnea.  No syncope.  No orthopnea, PND, edema.    Recent Labs: 10/31/2012: ALT 12  11/09/2012: Creatinine 1.10; Hemoglobin 11.5*; Potassium 3.9   Wt Readings from Last 3 Encounters:  11/09/12 202 lb (91.627 kg)  10/08/12 202 lb 12.8 oz (91.989 kg)  08/13/12 206 lb (93.441 kg)     Past Medical History  Diagnosis Date  . Hypertension   . Hyperlipidemia   . Fatigue   . Anxiety   . Coronary artery disease     a. s/p MI 2009 - PCI/DES distal  RCA w/ 2.75 x 18 Xience DES;  b. 05/2010 Myoview: Apical thinning, EF 73%;  c. 06/2010 Cath: moderate nonobs dzs, EF 65%  . Fibromyalgia   . Essential iris atrophy     Right side    Current Outpatient Prescriptions  Medication Sig Dispense Refill  . amLODipine (NORVASC) 5 MG tablet Take 5 mg by mouth every morning.      Marland Kitchen amLODipine (NORVASC) 5 MG tablet TAKE 1 TABLET BY MOUTH  DAILY  30 tablet  5  . aspirin EC 81 MG tablet Take 81 mg by mouth every morning.      . carvedilol (COREG) 12.5 MG tablet Take 1 tablet (12.5 mg total) by mouth 2 (two) times daily.  60 tablet  11  . clopidogrel (PLAVIX) 75 MG tablet Take 75 mg by mouth every morning.      . clopidogrel (PLAVIX) 75 MG tablet TAKE 1 TABLET BY MOUTH EVERY DAY  90 tablet  2  . ferrous sulfate 325 (65 FE) MG tablet Take 325 mg by mouth every morning.       . fluticasone (FLONASE) 50 MCG/ACT nasal spray Place 1 spray into the nose daily as needed for rhinitis or allergies.       Marland Kitchen HYDROcodone-acetaminophen (NORCO/VICODIN) 5-325 MG per tablet Take 2 tablets by mouth every 4 (four) hours as needed for pain.  10 tablet  0  . levofloxacin (LEVAQUIN) 750 MG tablet Take 1 tablet (750 mg total) by mouth daily.  7 tablet  0  . lisinopril (PRINIVIL,ZESTRIL) 40 MG tablet Take 40 mg by mouth every morning.      Marland Kitchen lisinopril (PRINIVIL,ZESTRIL) 40 MG tablet TAKE 1 TABLET BY MOUTH EVERY DAY  30 tablet  3  . methimazole (TAPAZOLE) 5 MG tablet Take 5 mg by mouth every morning.       . nitrofurantoin (MACRODANTIN) 100 MG capsule Take 100 mg by mouth every morning.       . nitroGLYCERIN (NITROSTAT) 0.4 MG SL tablet Place 0.4 mg under the tongue every 5 (five) minutes x 3 doses as needed for chest pain. For chest pain      . pantoprazole (PROTONIX) 40 MG tablet Take 40 mg by mouth 2 (two) times daily.       . pramipexole (MIRAPEX) 1 MG tablet Take 1 mg by mouth at bedtime.      . rosuvastatin (CRESTOR) 10 MG tablet Take 1 tablet (10 mg total) by mouth every morning.  90 tablet  3  . traMADol (ULTRAM) 50 MG tablet Take 50 mg by mouth 3 (three) times daily as needed for pain. For pain      . venlafaxine (EFFEXOR) 75 MG tablet Take 32.5 mg by mouth 2 (two) times daily.        No current facility-administered medications for this visit.    Allergies:   Codeine and Cortisone   Social History:  The patient  reports that she quit smoking  about 52 years ago. She has never used smokeless tobacco. She reports that she does not drink alcohol or use illicit drugs.   Family History:  The patient's family history includes Coronary artery disease in her brother, brother, brother, brother, and brother; Heart attack in her brother and mother.   ROS:  Please see the history of present illness.   She has chronic dysphagia.  No melena, hematochezia, hematuria.   All other systems reviewed and negative.   PHYSICAL EXAM: VS:  BP 140/64  Pulse 55  Ht 5\' 6"  (1.676 m)  Wt 211 lb (95.709 kg)  BMI 34.07 kg/m2 Well nourished, well developed, in no acute distress HEENT: normal Neck: no JVD Cardiac:  normal S1, S2; RRR; no murmur Lungs:  clear to auscultation bilaterally, no wheezing, rhonchi or rales Abd: soft, nontender, no hepatomegaly Ext: no edema Skin: warm and dry Neuro:  CNs 2-12 intact, no focal abnormalities noted  EKG:  Sinus bradycardia, HR 55, normal axis, no change from prior tracing.     ASSESSMENT AND PLAN:  1. Fatigue:  I suspect this is related to untreated sleep apnea.  She feels like there is an exertional component as well. I will check a BMET, TSH, CBC.  I will refer her to Dr. [name] for management of OSA.  I will arrange a Lexiscan Myoview.  She has been on Coreg for quite some time. I do not think this is the culprit at this time.   2. CAD:  Given her exercise intolerance and recent chest pain, I will arrange a Lexiscan Myoview.  Continue ASA, Plavix, statin, beta blocker, ACEI.   3. Hypertension:  Fair control. Continue current Rx. 4. Hyperlipidemia:  Continue statin. 5. Hyperthyroidism:  Repeat TSH today.  If TSH elevated, she will need follow up with PCP for adjustments.  6. Back Pain:  She needs ESI with Dr. Neomia Dear.  She needs to hold her Plavix for 5 days.  Will proceed with myoview as noted.  If this is low risk, she should be able to hold her Plavix for 5 days.   7. Disposition:  F/u with  Dr. Liam Rogers in 6 mos.   Signed, [name], PA-C  06/22/2013 4:08 PM

## 2013-06-23 ENCOUNTER — Telehealth: Payer: Self-pay | Admitting: *Deleted

## 2013-06-23 LAB — CBC WITH DIFFERENTIAL/PLATELET
BASOS PCT: 0.6 % (ref 0.0–3.0)
Basophils Absolute: 0 10*3/uL (ref 0.0–0.1)
EOS ABS: 0.3 10*3/uL (ref 0.0–0.7)
Eosinophils Relative: 5.3 % — ABNORMAL HIGH (ref 0.0–5.0)
HEMATOCRIT: 36.9 % (ref 36.0–46.0)
HEMOGLOBIN: 12.1 g/dL (ref 12.0–15.0)
LYMPHS ABS: 2 10*3/uL (ref 0.7–4.0)
LYMPHS PCT: 32.4 % (ref 12.0–46.0)
MCHC: 32.7 g/dL (ref 30.0–36.0)
MCV: 90.2 fl (ref 78.0–100.0)
Monocytes Absolute: 0.5 10*3/uL (ref 0.1–1.0)
Monocytes Relative: 7.6 % (ref 3.0–12.0)
NEUTROS ABS: 3.4 10*3/uL (ref 1.4–7.7)
Neutrophils Relative %: 54.1 % (ref 43.0–77.0)
Platelets: 222 10*3/uL (ref 150.0–400.0)
RBC: 4.09 Mil/uL (ref 3.87–5.11)
RDW: 13.9 % (ref 11.5–14.6)
WBC: 6.3 10*3/uL (ref 4.5–10.5)

## 2013-06-23 LAB — BASIC METABOLIC PANEL
BUN: 14 mg/dL (ref 6–23)
CALCIUM: 9.7 mg/dL (ref 8.4–10.5)
CHLORIDE: 106 meq/L (ref 96–112)
CO2: 29 meq/L (ref 19–32)
Creatinine, Ser: 1.1 mg/dL (ref 0.4–1.2)
GFR: 49.92 mL/min — ABNORMAL LOW (ref >60.00)
GLUCOSE: 81 mg/dL (ref 70–99)
POTASSIUM: 4 meq/L (ref 3.5–5.1)
SODIUM: 140 meq/L (ref 135–145)

## 2013-06-23 LAB — TSH: TSH: 0.66 u[IU]/mL (ref 0.35–5.50)

## 2013-06-23 NOTE — Telephone Encounter (Signed)
lmom lab normal

## 2013-07-02 ENCOUNTER — Encounter: Payer: Self-pay | Admitting: Cardiovascular Disease

## 2013-07-07 ENCOUNTER — Encounter (HOSPITAL_COMMUNITY): Payer: Medicare Other

## 2013-07-14 ENCOUNTER — Encounter: Payer: Self-pay | Admitting: General Surgery

## 2013-07-14 ENCOUNTER — Ambulatory Visit (INDEPENDENT_AMBULATORY_CARE_PROVIDER_SITE_OTHER): Payer: Medicare Other | Admitting: Cardiology

## 2013-07-14 VITALS — BP 170/80 | HR 66 | Wt 212.0 lb

## 2013-07-14 DIAGNOSIS — G4733 Obstructive sleep apnea (adult) (pediatric): Secondary | ICD-10-CM | POA: Diagnosis not present

## 2013-07-14 DIAGNOSIS — I2510 Atherosclerotic heart disease of native coronary artery without angina pectoris: Secondary | ICD-10-CM | POA: Diagnosis not present

## 2013-07-14 DIAGNOSIS — I10 Essential (primary) hypertension: Secondary | ICD-10-CM

## 2013-07-14 DIAGNOSIS — E669 Obesity, unspecified: Secondary | ICD-10-CM | POA: Diagnosis not present

## 2013-07-14 NOTE — Progress Notes (Signed)
[place], [place], [place]  [address] Phone: [phone] Fax:  [phone]  Date:  07/14/2013   ID:  [name], DOB 06-19-1940, MRN [id number]  PCP:  Donnie Coffin, MD  Sleep Medicine:  [name], MD  [name] is a 73 y.o. female with a history of OSA who has been on  CPAP.  She was initially seen by Dr. Maxwell Caul but has not followed up in some time.  She had a PSG done in 2011 showing severe OSA with an AHI of 77/hr and underwent CPAP titration but did not have adequate titration and was sent home on an auto device.  She says that she could not stand using the CPAP device because she is claustraphobic and no longer used it.  She says that snores very bad and wakes herself up.  She is tired when she gets up and can fall asleep anywhere during the day.     Wt Readings from Last 3 Encounters:  07/14/13 212 lb (96.163 kg)  06/22/13 211 lb (95.709 kg)  11/09/12 202 lb (91.627 kg)     Past Medical History  Diagnosis Date  . Hyperlipidemia   . Fatigue   . Anxiety   . Coronary artery disease     a. s/p MI 2009 - PCI/DES distal  RCA w/ 2.75 x 18 Xience DES;  b. 05/2010 Myoview: Apical thinning, EF 73%;  c. 06/2010 Cath: moderate nonobs dzs, EF 65%  . Fibromyalgia   . Essential iris atrophy     Right side  . Menopause   . Glaucoma   . Environmental allergies   . GERD (gastroesophageal reflux disease)   . Osteoarthritis   . OSA (obstructive sleep apnea)   . Obesity (BMI 30-39.9)   . Hypertension     Current Outpatient Prescriptions  Medication Sig Dispense Refill  . amLODipine (NORVASC) 5 MG tablet TAKE 1 TABLET BY MOUTH DAILY  30 tablet  5  . aspirin EC 81 MG tablet Take 81 mg by mouth every morning.      . carvedilol (COREG) 12.5 MG tablet Take 1 tablet (12.5 mg total) by mouth 2 (two) times daily.  60 tablet  11  . clopidogrel (PLAVIX) 75 MG tablet TAKE 1 TABLET BY MOUTH EVERY DAY  90 tablet  2  . ferrous sulfate 325 (65 FE) MG tablet Take 325 mg  by mouth every morning.       . fluticasone (FLONASE) 50 MCG/ACT nasal spray Place 1 spray into the nose daily as needed for rhinitis or allergies.       Marland Kitchen HYDROcodone-acetaminophen (NORCO/VICODIN) 5-325 MG per tablet Take 2 tablets by mouth every 4 (four) hours as needed for pain.  10 tablet  0  . lisinopril (PRINIVIL,ZESTRIL) 40 MG tablet Take 40 mg by mouth every morning.      . methimazole (TAPAZOLE) 5 MG tablet Take 5 mg by mouth every morning.       . nitrofurantoin (MACRODANTIN) 100 MG capsule Take 100 mg by mouth every morning.       . nitroGLYCERIN (NITROSTAT) 0.4 MG SL tablet Place 0.4 mg under the tongue every 5 (five) minutes x 3 doses as needed for chest pain. For chest pain      . pantoprazole (PROTONIX) 40 MG tablet Take 40 mg by mouth 2 (two) times daily.       . pramipexole (MIRAPEX) 1 MG tablet Take 1 mg by mouth at bedtime.      Marland Kitchen  rosuvastatin (CRESTOR) 10 MG tablet Take 1 tablet (10 mg total) by mouth every morning.  90 tablet  3  . traMADol (ULTRAM) 50 MG tablet Take 50 mg by mouth 3 (three) times daily as needed for pain. For pain      . venlafaxine (EFFEXOR) 75 MG tablet Take 32.5 mg by mouth 2 (two) times daily.       . VOLTAREN 1 % GEL As needed       No current facility-administered medications for this visit.    Allergies:    Allergies  Allergen Reactions  . Codeine Nausea And Vomiting and Other (See Comments)    Pt gets very hot and flushed  . Cortisone Nausea Only and Other (See Comments)    Pt gets very hot and flushed  . Septra [Sulfamethoxazole-Tmp Ds]     Social History:  The patient  reports that she quit smoking about 52 years ago. Her smoking use included Cigarettes. She smoked 0.00 packs per day. She has never used smokeless tobacco. She reports that she does not drink alcohol or use illicit drugs.   Family History:  The patient's family history includes Cancer in her father; Coronary artery disease in her brother, brother, brother, brother, and  brother; Heart attack in her brother and mother.   ROS:  Please see the history of present illness.      All other systems reviewed and negative.   PHYSICAL EXAM: VS:  BP 170/80  Pulse 66  Wt 212 lb (96.163 kg) Well nourished, well developed, in no acute distress HEENT: normal Neck: no JVD Cardiac:  normal S1, S2; RRR; no murmur Lungs:  clear to auscultation bilaterally, no wheezing, rhonchi or rales Abd: soft, nontender, no hepatomegaly Ext: no edema Skin: warm and dry Neuro:  CNs 2-12 intact, no focal abnormalities noted       ASSESSMENT AND PLAN:  1. Severe OSA - intolerant to CPAP masks in the past.  I showed her the Airfit P10 mask which I think would suite her better and she says that she is willing to try it.  She will need to have a repeat split night sleep study since her last study was 3 years ago. 2. Obesity - exercise limited by arthritis 3. HTN - elevated but she ran out of 2 of her meds 2 days ago.  She says that it usually is 130/30mmHg at home when taking her meds daily.  Followup with me after sleep study  Signed, [name], MD 07/14/2013 10:34 AM

## 2013-07-14 NOTE — Patient Instructions (Signed)
Your physician recommends that you continue on your current medications as directed. Please refer to the Current Medication list given to you today.  Your physician has recommended that you have a sleep study. This test records several body functions during sleep, including: brain activity, eye movement, oxygen and carbon dioxide blood levels, heart rate and rhythm, breathing rate and rhythm, the flow of air through your mouth and nose, snoring, body muscle movements, and chest and belly movement. (Schedule at [facility])  The name of the mask we suggest you wear is the Resmed Airfit P10 with chin strap

## 2013-07-15 ENCOUNTER — Ambulatory Visit: Payer: Medicare Other | Admitting: Cardiology

## 2013-07-23 ENCOUNTER — Ambulatory Visit (HOSPITAL_COMMUNITY): Payer: Medicare Other | Attending: Cardiovascular Disease | Admitting: Radiology

## 2013-07-23 ENCOUNTER — Encounter: Payer: Self-pay | Admitting: Cardiovascular Disease

## 2013-07-23 VITALS — BP 142/69 | HR 99 | Ht 66.0 in | Wt 208.0 lb

## 2013-07-23 DIAGNOSIS — R5381 Other malaise: Secondary | ICD-10-CM | POA: Insufficient documentation

## 2013-07-23 DIAGNOSIS — R5383 Other fatigue: Secondary | ICD-10-CM

## 2013-07-23 DIAGNOSIS — I2510 Atherosclerotic heart disease of native coronary artery without angina pectoris: Secondary | ICD-10-CM

## 2013-07-23 DIAGNOSIS — R079 Chest pain, unspecified: Secondary | ICD-10-CM | POA: Diagnosis not present

## 2013-07-23 MED ORDER — TECHNETIUM TC 99M SESTAMIBI GENERIC - CARDIOLITE
30.0000 | Freq: Once | INTRAVENOUS | Status: AC | PRN
  Administered 2013-07-23: 30 via INTRAVENOUS

## 2013-07-23 MED ORDER — TECHNETIUM TC 99M SESTAMIBI GENERIC - CARDIOLITE
10.0000 | Freq: Once | INTRAVENOUS | Status: AC | PRN
  Administered 2013-07-23: 10 via INTRAVENOUS

## 2013-07-23 MED ORDER — REGADENOSON 0.4 MG/5ML IV SOLN
0.4000 mg | Freq: Once | INTRAVENOUS | Status: AC
  Administered 2013-07-23: 0.4 mg via INTRAVENOUS

## 2013-07-23 NOTE — Progress Notes (Signed)
[facility] [address] [place], [place] [address] [phone]    Cardiology Nuclear Med Study  [name] is a 73 y.o. female     MRN : [id number]     DOB: [birth year]  Procedure Date: 07/23/2013  Nuclear Med Background Indication for Stress Test:  Evaluation for Ischemia and Stent Patency History:  CAD, MI, Cath (stent patent), Stent (RCA), Echo 2011 EF 65-70%, MPI 2012 EF 73% Cardiac Risk Factors: Family History - CAD, History of Smoking, Hypertension and Lipids  Symptoms:  Chest Pain and Fatigue   Nuclear Pre-Procedure Caffeine/Decaff Intake:  None NPO After: 9:00am   Lungs:  clear O2 Sat: 99% on room air. IV 0.9% NS with Angio Cath:  22g  IV Site: R Hand  IV Started by:  Crissie Figures, RN  Chest Size (in):  40 Cup Size: D  Height: 5\' 6"  (1.676 m)  Weight:  208 lb (94.348 kg)  BMI:  Body mass index is 33.59 kg/(m^2). Tech Comments:  N/A    Nuclear Med Study 1 or 2 day study: 1 day  Stress Test Type:  Lexiscan  Reading MD: N/A  Order Authorizing Provider:  Mertie Moores, MD  Resting Radionuclide: Technetium 60m Sestamibi  Resting Radionuclide Dose: 11.0 mCi   Stress Radionuclide:  Technetium 28m Sestamibi  Stress Radionuclide Dose: 33.0 mCi           Stress Protocol Rest HR: 54 Stress HR: 59  Rest BP: 142/69 Stress BP: 137/64  Exercise Time (min): n/a METS: n/a           Dose of Adenosine (mg):  n/a Dose of Lexiscan: 0.4 mg  Dose of Atropine (mg): n/a Dose of Dobutamine: n/a mcg/kg/min (at max HR)  Stress Test Technologist: Glade Lloyd, BS-ES  Nuclear Technologist:  Vedia Pereyra, CNMT     Rest Procedure:  Myocardial perfusion imaging was performed at rest 45 minutes following the intravenous administration of Technetium 55m Sestamibi. Rest ECG: NSR - Normal EKG  Stress Procedure:  The patient received IV Lexiscan 0.4 mg over 15-seconds.  Technetium 76m Sestamibi injected at 30-seconds.  Quantitative spect images were  obtained after a 45 minute delay.  During the infusion of Lexiscan, the patient complained of slight nausea and a strange sensation in her hands and legs.  These symptoms resolved in recovery.  Stress ECG: No significant change from baseline ECG  QPS Raw Data Images:  Normal; no motion artifact; normal heart/lung ratio. Stress Images:  Normal homogeneous uptake in all areas of the myocardium. Rest Images:  Normal homogeneous uptake in all areas of the myocardium. Subtraction (SDS):  There is no evidence of scar or ischemia. Transient Ischemic Dilatation (Normal <1.22):  1.04 Lung/Heart Ratio (Normal <0.45):  0.25  Quantitative Gated Spect Images QGS EDV:  85 ml QGS ESV:  28 ml  Impression Exercise Capacity:  Lexiscan with no exercise. BP Response:  Normal blood pressure response. Clinical Symptoms:  nausea ECG Impression:  No significant ST segment change suggestive of ischemia. Comparison with Prior Nuclear Study: No images to compare  Overall Impression:  Normal stress nuclear study.  LV Ejection Fraction: 67%.  LV Wall Motion:  NL LV Function; NL Wall Motion  [name] 07/23/2013

## 2013-07-27 ENCOUNTER — Telehealth: Payer: Self-pay | Admitting: *Deleted

## 2013-07-27 ENCOUNTER — Encounter: Payer: Self-pay | Admitting: Physician Assistant

## 2013-07-27 NOTE — Telephone Encounter (Signed)
lmptcb on both pt's home # and pt's daughter Ena Dawley phone as well about test results.

## 2013-07-27 NOTE — Telephone Encounter (Signed)
Follow Up;  Pt is returning Carol's call

## 2013-07-27 NOTE — Telephone Encounter (Signed)
pt notified about normal myoview and ok to proceeed with epidural Steroid Injection. Pt said thank you. I will fax to Dr. Ace Gins and to Dr. Alroy Dust per pt request.

## 2013-07-31 ENCOUNTER — Encounter: Payer: Self-pay | Admitting: Cardiology

## 2013-07-31 DIAGNOSIS — G4733 Obstructive sleep apnea (adult) (pediatric): Secondary | ICD-10-CM | POA: Diagnosis not present

## 2013-08-04 ENCOUNTER — Telehealth: Payer: Self-pay | Admitting: Cardiology

## 2013-08-04 DIAGNOSIS — G971 Other reaction to spinal and lumbar puncture: Secondary | ICD-10-CM | POA: Diagnosis not present

## 2013-08-04 DIAGNOSIS — G894 Chronic pain syndrome: Secondary | ICD-10-CM | POA: Diagnosis not present

## 2013-08-04 DIAGNOSIS — M545 Low back pain, unspecified: Secondary | ICD-10-CM | POA: Diagnosis not present

## 2013-08-04 DIAGNOSIS — M5137 Other intervertebral disc degeneration, lumbosacral region: Secondary | ICD-10-CM | POA: Diagnosis not present

## 2013-08-04 NOTE — Telephone Encounter (Signed)
Please let patient know that she has severe OSA.  Please order a mask desensitization with a ResMed AirFit P10 mask and chin strap for 2 weeks and then order a CPAP tirtration in the lab

## 2013-08-05 ENCOUNTER — Other Ambulatory Visit: Payer: Self-pay | Admitting: General Surgery

## 2013-08-05 DIAGNOSIS — G4733 Obstructive sleep apnea (adult) (pediatric): Secondary | ICD-10-CM

## 2013-08-05 NOTE — Telephone Encounter (Signed)
Ordered With [facility] and message sent to betsy to make aware. . Paper work filled out for CPAP titration for [facility].

## 2013-08-05 NOTE — Telephone Encounter (Signed)
Pt stated she can not do this. She said she has had to many panic attacks due to this. She can not have anything on her head or face. She does not wish to pursue the cpap machine.

## 2013-08-05 NOTE — Telephone Encounter (Signed)
LVM for pt to return call

## 2013-08-06 NOTE — Telephone Encounter (Signed)
Please let patient know that this mask is just like a nasal cannula used for oxygen when in the hospital and is now a mask

## 2013-08-10 NOTE — Telephone Encounter (Signed)
Please see if patient would be willing to consider an oral device that fits like a retainer in her mouth on her teeth

## 2013-08-10 NOTE — Telephone Encounter (Signed)
Pt is aware and does not want to proceed. To Dr Radford Pax to advise.

## 2013-08-10 NOTE — Telephone Encounter (Signed)
lvm for pt to return call °

## 2013-08-13 NOTE — Telephone Encounter (Signed)
Please refer her to Dr. Augustina Mood for oral device placement for OSA intolerant to CPAP

## 2013-08-13 NOTE — Telephone Encounter (Signed)
Called pt to make aware that we will be referring her to Dr Toy Cookey. Faxed Sleep study and order to Dr Betsey Holiday Office.

## 2013-08-13 NOTE — Telephone Encounter (Signed)
Pt wants to proceed with this idea

## 2013-09-03 DIAGNOSIS — M545 Low back pain, unspecified: Secondary | ICD-10-CM | POA: Diagnosis not present

## 2013-09-03 DIAGNOSIS — M47817 Spondylosis without myelopathy or radiculopathy, lumbosacral region: Secondary | ICD-10-CM | POA: Diagnosis not present

## 2013-09-03 DIAGNOSIS — M5137 Other intervertebral disc degeneration, lumbosacral region: Secondary | ICD-10-CM | POA: Diagnosis not present

## 2013-09-03 DIAGNOSIS — G894 Chronic pain syndrome: Secondary | ICD-10-CM | POA: Diagnosis not present

## 2013-09-07 ENCOUNTER — Telehealth: Payer: Self-pay | Admitting: Cardiology

## 2013-09-07 NOTE — Telephone Encounter (Signed)
New Message:  Pt is requesting a call back from Dr. Theodosia Blender nurse. Has questions about sleep apnea... Will give more details when the nurse calls.

## 2013-09-07 NOTE — Telephone Encounter (Signed)
I would recommend she ask her Dentist if they are aware of any other dentists in town that treat OSA with oral devices

## 2013-09-07 NOTE — Telephone Encounter (Signed)
Called stating Dr. Corky Sing office is requiring money up front before she is seen.  She does not have the money and wants to know if Dr. Radford Pax has any other suggestions or another physician that will accept payments. Advised will forward to Dr. Radford Pax for advice.

## 2013-09-08 NOTE — Telephone Encounter (Signed)
Pt is aware of Dr. Theodosia Blender recommendations to ask her Dentist if they are aware of any other Dentists in town that treat OSA with oral devices.  Pt verbalized understanding.

## 2013-09-17 DIAGNOSIS — M545 Low back pain, unspecified: Secondary | ICD-10-CM | POA: Diagnosis not present

## 2013-09-17 DIAGNOSIS — M47817 Spondylosis without myelopathy or radiculopathy, lumbosacral region: Secondary | ICD-10-CM | POA: Diagnosis not present

## 2013-09-17 DIAGNOSIS — G894 Chronic pain syndrome: Secondary | ICD-10-CM | POA: Diagnosis not present

## 2013-09-19 ENCOUNTER — Other Ambulatory Visit: Payer: Self-pay | Admitting: Cardiovascular Disease

## 2013-09-29 DIAGNOSIS — M545 Low back pain, unspecified: Secondary | ICD-10-CM | POA: Diagnosis not present

## 2013-09-29 DIAGNOSIS — M47817 Spondylosis without myelopathy or radiculopathy, lumbosacral region: Secondary | ICD-10-CM | POA: Diagnosis not present

## 2013-09-30 DIAGNOSIS — K219 Gastro-esophageal reflux disease without esophagitis: Secondary | ICD-10-CM | POA: Diagnosis not present

## 2013-09-30 DIAGNOSIS — E0590 Thyrotoxicosis, unspecified without thyrotoxic crisis or storm: Secondary | ICD-10-CM | POA: Diagnosis not present

## 2013-09-30 DIAGNOSIS — G2589 Other specified extrapyramidal and movement disorders: Secondary | ICD-10-CM | POA: Diagnosis not present

## 2013-09-30 DIAGNOSIS — Z23 Encounter for immunization: Secondary | ICD-10-CM | POA: Diagnosis not present

## 2013-09-30 DIAGNOSIS — M1990 Unspecified osteoarthritis, unspecified site: Secondary | ICD-10-CM | POA: Diagnosis not present

## 2013-09-30 DIAGNOSIS — I10 Essential (primary) hypertension: Secondary | ICD-10-CM | POA: Diagnosis not present

## 2013-09-30 DIAGNOSIS — F411 Generalized anxiety disorder: Secondary | ICD-10-CM | POA: Diagnosis not present

## 2013-10-01 DIAGNOSIS — M47817 Spondylosis without myelopathy or radiculopathy, lumbosacral region: Secondary | ICD-10-CM | POA: Diagnosis not present

## 2013-10-01 DIAGNOSIS — M545 Low back pain, unspecified: Secondary | ICD-10-CM | POA: Diagnosis not present

## 2013-10-06 ENCOUNTER — Encounter: Payer: Self-pay | Admitting: Cardiovascular Disease

## 2013-10-06 ENCOUNTER — Encounter: Payer: Self-pay | Admitting: Internal Medicine

## 2013-10-06 DIAGNOSIS — M545 Low back pain, unspecified: Secondary | ICD-10-CM | POA: Diagnosis not present

## 2013-10-06 DIAGNOSIS — M47817 Spondylosis without myelopathy or radiculopathy, lumbosacral region: Secondary | ICD-10-CM | POA: Diagnosis not present

## 2013-10-07 DIAGNOSIS — N3020 Other chronic cystitis without hematuria: Secondary | ICD-10-CM | POA: Diagnosis not present

## 2013-10-13 DIAGNOSIS — M545 Low back pain, unspecified: Secondary | ICD-10-CM | POA: Diagnosis not present

## 2013-10-13 DIAGNOSIS — M47817 Spondylosis without myelopathy or radiculopathy, lumbosacral region: Secondary | ICD-10-CM | POA: Diagnosis not present

## 2013-10-20 ENCOUNTER — Encounter: Payer: Self-pay | Admitting: Cardiovascular Disease

## 2013-10-20 ENCOUNTER — Ambulatory Visit (INDEPENDENT_AMBULATORY_CARE_PROVIDER_SITE_OTHER): Payer: Medicare Other | Admitting: Cardiovascular Disease

## 2013-10-20 ENCOUNTER — Other Ambulatory Visit: Payer: Medicare Other

## 2013-10-20 VITALS — BP 142/64 | HR 65 | Ht 66.0 in | Wt 216.4 lb

## 2013-10-20 DIAGNOSIS — M545 Low back pain, unspecified: Secondary | ICD-10-CM | POA: Diagnosis not present

## 2013-10-20 DIAGNOSIS — I2510 Atherosclerotic heart disease of native coronary artery without angina pectoris: Secondary | ICD-10-CM

## 2013-10-20 DIAGNOSIS — M47817 Spondylosis without myelopathy or radiculopathy, lumbosacral region: Secondary | ICD-10-CM | POA: Diagnosis not present

## 2013-10-20 LAB — LIPID PANEL
CHOLESTEROL: 177 mg/dL (ref 0–200)
HDL: 36.2 mg/dL — ABNORMAL LOW (ref >39.00)
LDL Cholesterol: 111 mg/dL — ABNORMAL HIGH (ref 0–99)
TRIGLYCERIDES: 151 mg/dL — AB (ref 0.0–149.0)
Total CHOL/HDL Ratio: 5
VLDL: 30.2 mg/dL (ref 0.0–40.0)

## 2013-10-20 LAB — BASIC METABOLIC PANEL
BUN: 16 mg/dL (ref 6–23)
CHLORIDE: 104 meq/L (ref 96–112)
CO2: 29 mEq/L (ref 19–32)
CREATININE: 1 mg/dL (ref 0.4–1.2)
Calcium: 9.9 mg/dL (ref 8.4–10.5)
GFR: 58.01 mL/min — AB (ref >60.00)
GLUCOSE: 88 mg/dL (ref 70–99)
POTASSIUM: 4.7 meq/L (ref 3.5–5.1)
Sodium: 139 mEq/L (ref 135–145)

## 2013-10-20 LAB — HEPATIC FUNCTION PANEL
ALBUMIN: 3.9 g/dL (ref 3.5–5.2)
ALT: 25 U/L (ref 0–35)
AST: 25 U/L (ref 0–37)
Alkaline Phosphatase: 91 U/L (ref 39–117)
Bilirubin, Direct: 0 mg/dL (ref 0.0–0.3)
Total Bilirubin: 0.4 mg/dL (ref 0.2–1.2)
Total Protein: 7.5 g/dL (ref 6.0–8.3)

## 2013-10-20 MED ORDER — LISINOPRIL 40 MG PO TABS: 40.0000 mg | ORAL_TABLET | Freq: Every morning | ORAL | Status: DC

## 2013-10-20 MED ORDER — CARVEDILOL 12.5 MG PO TABS: 12.5000 mg | ORAL_TABLET | Freq: Two times a day (BID) | ORAL | Status: DC

## 2013-10-20 MED ORDER — AMLODIPINE BESYLATE 5 MG PO TABS: 5.0000 mg | ORAL_TABLET | Freq: Every day | ORAL | Status: DC

## 2013-10-20 NOTE — Assessment & Plan Note (Signed)
She is working with Dr. Radford Pax and her dentist for treatment of her sleep apnea. We will refill her blood pressure medications. Overall her blood pressure seems to be fairly well controlled and I think it will improve as her sleep apnea gets under better control. I will see her again in one year.

## 2013-10-20 NOTE — Patient Instructions (Signed)
Your physician recommends that you have lab work:  TODAY  Your physician recommends that you continue on your current medications as directed. Please refer to the Current Medication list given to you today.  Your physician wants you to follow-up in: 1 year with Dr. Nahser.  You will receive a reminder letter in the mail two months in advance. If you don't receive a letter, please call our office to schedule the follow-up appointment.  

## 2013-10-20 NOTE — Progress Notes (Signed)
Patient Name: [name] Date of Encounter: 10/20/2013  Primary Care Provider:  Donnie Coffin, MD Primary Cardiologist:  Joaquim Nam, MD  Problem List  1. CAD 2. Hyperlipidemia 3. Obstructive sleep apnea 4. Hypertension  Past Medical History  Diagnosis Date  . Hyperlipidemia   . Fatigue   . Anxiety   . Coronary artery disease     a. s/p MI 2009 - PCI/DES distal  RCA w/ 2.75 x 18 Xience DES;  b. 05/2010 Myoview: Apical thinning, EF 73%;  c. 06/2010 Cath: moderate nonobs dzs, EF 65%;  d.  Lexiscan Myoview (07/2013):  No scar or ischemia, EF 67%; Normal Study  . Fibromyalgia   . Essential iris atrophy     Right side  . Menopause   . Glaucoma   . Environmental allergies   . GERD (gastroesophageal reflux disease)   . Osteoarthritis   . OSA (obstructive sleep apnea)   . Obesity (BMI 30-39.9)   . Hypertension    Past Surgical History  Procedure Laterality Date  . Cardiac catheterization  06/29/2010    Mild to moderate coronary artery irregularities.  Her  proximal left anterior descending artery is moderately narrowed.  She  also has an eccentric stenosis in the proximal LAD.  These do not appear  to obstruct flow at present, but they are fairly small vessels.  They  appeared to be between 1.5 and 2 mm in diamete  . Cardiac catheterization  08/02/2009    Patent stent  . Cardiac catheterization  05/12/2008    Stent to the distal RCA    Allergies  Allergies  Allergen Reactions  . Codeine Nausea And Vomiting and Other (See Comments)    Pt gets very hot and flushed  . Cortisone Nausea Only and Other (See Comments)    Pt gets very hot and flushed  . Septra [Sulfamethoxazole-Tmp Ds]     HPI  April 63, 2672:  73 year old female with the above problem list.  She was in usual state of health about 3 weeks ago when she began to note mild lower extremity edema with bilateral ankle and calf pain with ambulation.  She takes her left leg has been more swollen than the right.  She  has been relatively active recently partaking and gardening without any chest pain or dyspnea.  Her weight is actually down from baseline.  She's seen no change in her salt intake or amt of time that her legs might be in a dependent position.  She's quite flustered and anxious today.  She misplaced her phone and her dtr initially dropped her off @ the old [facility] office and she had to walk down the street to here.  She doesn't know how to reach her dtr to have her come down and pick her up.  She's tearful and emotionally upset.  In that setting, after the visit, pt said that she thinks she's having chest pain.  An ecg was done, and before it was even completed, c/p resolved, as did her emotional upset.  Oct 08, 2012:  [name] is doing well. She's not had any episodes of chest pain or shortness of breath. She was seen by our PA last year. She has a history of coronary artery disease but has not had any recurrent angina. She's been working out in her garden without difficulties.  She has occasional episodes of orthostatic hypotension and also notes some occasional leg edema. I suspect this is from the amlodipine.  October 20, 2013:  [name] has seen [name] and was referred to [name] for management of her sleep apnea.   Her BP has been well controlled.  She needs to have a special dental appliance made for her OSA- did not tolerate the CPAP. She has chronic dyspnea. No CP   She has found to have some thyroid issues.  Sees Dr. Alroy Dust for this.     Home Medications  Prior to Admission medications   Medication Sig Start Date End Date Taking? Authorizing Provider  acetaminophen (TYLENOL) 500 MG tablet Take 500 mg by mouth as needed.    Yes Historical Provider, MD  amitriptyline (ELAVIL) 10 MG tablet Take 10 mg by mouth at bedtime.   Yes Historical Provider, MD  amLODipine (NORVASC) 5 MG tablet Take 1 tablet (5 mg total) by mouth daily. 09/03/11 09/02/12 Yes Thayer Headings, MD  aspirin  325 MG tablet Take 325 mg by mouth daily.     Yes Historical Provider, MD  carvedilol (COREG) 12.5 MG tablet Take 1 tablet (12.5 mg total) by mouth 2 (two) times daily. 09/03/11 09/02/12 Yes Thayer Headings, MD  clopidogrel (PLAVIX) 75 MG tablet TAKE ONE TABLET BY MOUTH DAILY 07/06/11  Yes Burtis Junes, NP  diclofenac sodium (VOLTAREN) 1 % GEL Apply topically. **CREAM** use as directed    Yes Historical Provider, MD  fluticasone (FLONASE) 50 MCG/ACT nasal spray Place 1 spray into the nose daily.   Yes Historical Provider, MD  hydrochlorothiazide 25 MG tablet Take 25 mg by mouth daily.    Yes Historical Provider, MD  HYDROcodone-homatropine (HYCODAN) 5-1.5 MG/5ML syrup Take 5 mLs by mouth every 8 (eight) hours as needed for cough. 08/29/11 09/08/11 Yes Robyn Haber, MD  IRON PO Take 1 tablet by mouth daily.     Yes Historical Provider, MD  levofloxacin (LEVAQUIN) 500 MG tablet Take 1 tablet (500 mg total) by mouth daily. 08/29/11 09/08/11 Yes Robyn Haber, MD  methimazole (TAPAZOLE) 5 MG tablet Take 5 mg by mouth daily.   Yes Historical Provider, MD  nitroGLYCERIN (NITROSTAT) 0.4 MG SL tablet Place 0.4 mg under the tongue every 5 (five) minutes as needed.     Yes Historical Provider, MD  pantoprazole (PROTONIX) 40 MG tablet Take 40 mg by mouth 2 (two) times daily.    Yes Historical Provider, MD  potassium chloride (KLOR-CON) 10 MEQ CR tablet Take 1 tablet (10 mEq total) by mouth daily. 02/23/11  Yes Thayer Headings, MD  pramipexole (MIRAPEX) 0.25 MG tablet Take 1 mg by mouth at bedtime.    Yes Historical Provider, MD  rosuvastatin (CRESTOR) 10 MG tablet Take 1 tablet (10 mg total) by mouth daily. 06/06/11  Yes Thayer Headings, MD  traMADol (ULTRAM) 50 MG tablet Take 50 mg by mouth 3 (three) times daily as needed.    Yes Historical Provider, MD  venlafaxine (EFFEXOR) 75 MG tablet Take 75 mg by mouth daily. 1/2 tablet 2 (two) times daily   Yes Historical Provider, MD  lisinopril (PRINIVIL,ZESTRIL) 40  MG tablet Take 1 tablet (40 mg total) by mouth daily. 09/07/11 09/06/12  Thayer Headings, MD     Physical Exam  Blood pressure 142/64, pulse 65, height 5\' 6"  (1.676 m), weight 216 lb 6.4 oz (98.158 kg), SpO2 96.00%.  General: Pleasant, NAD Psych: Normal affect. Neuro: Alert and oriented X 3. Moves all extremities spontaneously. HEENT: Normal  Neck: Supple without bruits or JVD. Lungs:  Resp regular and unlabored, CTA. Heart: RRR no s3,  s4. 1/6 sem lusb. Abdomen: Soft, non-tender, non-distended, BS + x 4.  Extremities: No clubbing, cyanosis or edema.  Legs are tender to palpation. DP/PT/Radials 2+ and equal bilaterally.  ECG  Oct 08, 2012:  NSR at 62, normal ECG

## 2013-10-20 NOTE — Assessment & Plan Note (Signed)
She status post stenting in 2001. She's not had any recent episodes of chest pain.  She has lots of fatigue but none of these symptoms sound like coronary artery disease.

## 2013-10-21 ENCOUNTER — Telehealth: Payer: Self-pay | Admitting: Nurse Practitioner

## 2013-10-21 DIAGNOSIS — I2510 Atherosclerotic heart disease of native coronary artery without angina pectoris: Secondary | ICD-10-CM

## 2013-10-21 MED ORDER — ROSUVASTATIN CALCIUM 20 MG PO TABS: 20.0000 mg | ORAL_TABLET | Freq: Every morning | ORAL | Status: DC

## 2013-10-21 NOTE — Telephone Encounter (Signed)
Reviewed results and plan of care with patient who verbalized understanding and agreement.  Patient agrees to increase Crestor to 20 mg once daily and is scheduled for repeat fasting lipid profile in 3 months.  Orders in epic

## 2013-10-23 ENCOUNTER — Telehealth: Payer: Self-pay | Admitting: Cardiovascular Disease

## 2013-10-23 NOTE — Telephone Encounter (Signed)
Per pt call - feet and legs are "really swollen now."  Pt states about 1/3 of the way up her leg.  Her weight today is 210 lbs which is less than what she weighed here at last office visit a few days ago.  She has not been keeping legs elevated above the level of her heart when sitting.  She denies any SOB or other S/S.  She would like to re-start a diuretic.  Reports she used to take hydrochlorothiazide 25 mg a day.  Aware I will  Forward this information to Dr Acie Fredrickson for review and any orders.

## 2013-10-23 NOTE — Telephone Encounter (Signed)
Ok to restart HCTZ 25 a day and KCl 10 meq a day

## 2013-10-23 NOTE — Telephone Encounter (Signed)
New message     Pt decided she want to start taking fluid pills again----feet/ankles are swollen.  Please call it in to [facility]

## 2013-10-26 MED ORDER — POTASSIUM CHLORIDE ER 10 MEQ PO TBCR: 10.0000 meq | EXTENDED_RELEASE_TABLET | Freq: Every day | ORAL | Status: DC

## 2013-10-26 MED ORDER — HYDROCHLOROTHIAZIDE 25 MG PO TABS: 25.0000 mg | ORAL_TABLET | Freq: Every day | ORAL | Status: DC

## 2013-10-26 NOTE — Telephone Encounter (Signed)
Message from Friday.  Per Dr. Acie Fredrickson may restart HCTZ 25 mg and KCL 93meq daily.  She states her foot is down a lot today but feels like she needs the fluid pill.  Will send Rx to [facility].  She will let us know if this doesn't help with the swelling.

## 2013-10-27 DIAGNOSIS — M47817 Spondylosis without myelopathy or radiculopathy, lumbosacral region: Secondary | ICD-10-CM | POA: Diagnosis not present

## 2013-10-27 DIAGNOSIS — M545 Low back pain, unspecified: Secondary | ICD-10-CM | POA: Diagnosis not present

## 2013-11-10 DIAGNOSIS — R42 Dizziness and giddiness: Secondary | ICD-10-CM | POA: Diagnosis not present

## 2013-11-10 DIAGNOSIS — M545 Low back pain, unspecified: Secondary | ICD-10-CM | POA: Diagnosis not present

## 2013-11-10 DIAGNOSIS — M47817 Spondylosis without myelopathy or radiculopathy, lumbosacral region: Secondary | ICD-10-CM | POA: Diagnosis not present

## 2013-11-10 DIAGNOSIS — I10 Essential (primary) hypertension: Secondary | ICD-10-CM | POA: Diagnosis not present

## 2013-11-12 DIAGNOSIS — M545 Low back pain, unspecified: Secondary | ICD-10-CM | POA: Diagnosis not present

## 2013-11-12 DIAGNOSIS — M47817 Spondylosis without myelopathy or radiculopathy, lumbosacral region: Secondary | ICD-10-CM | POA: Diagnosis not present

## 2013-11-17 ENCOUNTER — Emergency Department (HOSPITAL_COMMUNITY): Payer: Medicare Other

## 2013-11-17 ENCOUNTER — Encounter (HOSPITAL_COMMUNITY): Payer: Self-pay | Admitting: Emergency Medicine

## 2013-11-17 ENCOUNTER — Emergency Department (HOSPITAL_COMMUNITY)
Admission: EM | Admit: 2013-11-17 | Discharge: 2013-11-17 | Disposition: A | Discharge status: Home / Self Care | Payer: Medicare Other | Attending: Emergency Medicine | Admitting: Emergency Medicine

## 2013-11-17 DIAGNOSIS — M1990 Unspecified osteoarthritis, unspecified site: Secondary | ICD-10-CM | POA: Insufficient documentation

## 2013-11-17 DIAGNOSIS — M545 Low back pain, unspecified: Secondary | ICD-10-CM | POA: Diagnosis not present

## 2013-11-17 DIAGNOSIS — R110 Nausea: Secondary | ICD-10-CM | POA: Insufficient documentation

## 2013-11-17 DIAGNOSIS — E669 Obesity, unspecified: Secondary | ICD-10-CM | POA: Insufficient documentation

## 2013-11-17 DIAGNOSIS — Z79899 Other long term (current) drug therapy: Secondary | ICD-10-CM | POA: Diagnosis not present

## 2013-11-17 DIAGNOSIS — Z9861 Coronary angioplasty status: Secondary | ICD-10-CM | POA: Diagnosis not present

## 2013-11-17 DIAGNOSIS — Z8719 Personal history of other diseases of the digestive system: Secondary | ICD-10-CM | POA: Insufficient documentation

## 2013-11-17 DIAGNOSIS — I2510 Atherosclerotic heart disease of native coronary artery without angina pectoris: Secondary | ICD-10-CM | POA: Diagnosis not present

## 2013-11-17 DIAGNOSIS — Z8669 Personal history of other diseases of the nervous system and sense organs: Secondary | ICD-10-CM | POA: Insufficient documentation

## 2013-11-17 DIAGNOSIS — I10 Essential (primary) hypertension: Secondary | ICD-10-CM | POA: Diagnosis not present

## 2013-11-17 DIAGNOSIS — R42 Dizziness and giddiness: Secondary | ICD-10-CM | POA: Insufficient documentation

## 2013-11-17 DIAGNOSIS — F411 Generalized anxiety disorder: Secondary | ICD-10-CM | POA: Insufficient documentation

## 2013-11-17 DIAGNOSIS — E785 Hyperlipidemia, unspecified: Secondary | ICD-10-CM | POA: Insufficient documentation

## 2013-11-17 DIAGNOSIS — Z87891 Personal history of nicotine dependence: Secondary | ICD-10-CM | POA: Diagnosis not present

## 2013-11-17 DIAGNOSIS — M47817 Spondylosis without myelopathy or radiculopathy, lumbosacral region: Secondary | ICD-10-CM | POA: Diagnosis not present

## 2013-11-17 DIAGNOSIS — IMO0001 Reserved for inherently not codable concepts without codable children: Secondary | ICD-10-CM | POA: Insufficient documentation

## 2013-11-17 LAB — CBC WITH DIFFERENTIAL/PLATELET
Basophils Absolute: 0 10*3/uL (ref 0.0–0.1)
Basophils Relative: 1 % (ref 0–1)
EOS ABS: 0.2 10*3/uL (ref 0.0–0.7)
EOS PCT: 3 % (ref 0–5)
HCT: 37.1 % (ref 36.0–46.0)
HEMOGLOBIN: 11.9 g/dL — AB (ref 12.0–15.0)
LYMPHS ABS: 1.6 10*3/uL (ref 0.7–4.0)
Lymphocytes Relative: 25 % (ref 12–46)
MCH: 29.2 pg (ref 26.0–34.0)
MCHC: 32.1 g/dL (ref 30.0–36.0)
MCV: 91.2 fL (ref 78.0–100.0)
MONO ABS: 0.6 10*3/uL (ref 0.1–1.0)
MONOS PCT: 9 % (ref 3–12)
Neutro Abs: 4 10*3/uL (ref 1.7–7.7)
Neutrophils Relative %: 62 % (ref 43–77)
Platelets: 210 10*3/uL (ref 150–400)
RBC: 4.07 MIL/uL (ref 3.87–5.11)
RDW: 14.4 % (ref 11.5–15.5)
WBC: 6.4 10*3/uL (ref 4.0–10.5)

## 2013-11-17 LAB — BASIC METABOLIC PANEL
BUN: 16 mg/dL (ref 6–23)
CALCIUM: 9.8 mg/dL (ref 8.4–10.5)
CO2: 27 mEq/L (ref 19–32)
CREATININE: 1.26 mg/dL — AB (ref 0.50–1.10)
Chloride: 105 mEq/L (ref 96–112)
GFR calc Af Amer: 48 mL/min — ABNORMAL LOW (ref >90)
GFR, EST NON AFRICAN AMERICAN: 42 mL/min — AB (ref >90)
GLUCOSE: 107 mg/dL — AB (ref 70–99)
Potassium: 4.3 mEq/L (ref 3.7–5.3)
Sodium: 143 mEq/L (ref 137–147)

## 2013-11-17 MED ORDER — MECLIZINE HCL 50 MG PO TABS: 50.0000 mg | ORAL_TABLET | Freq: Three times a day (TID) | ORAL | Status: DC | PRN

## 2013-11-17 MED ORDER — MECLIZINE HCL 25 MG PO TABS
25.0000 mg | ORAL_TABLET | Freq: Once | ORAL | Status: AC
  Administered 2013-11-17: 25 mg via ORAL
  Filled 2013-11-17: qty 1

## 2013-11-17 MED ORDER — ONDANSETRON 4 MG PO TBDP: 4.0000 mg | ORAL_TABLET | Freq: Three times a day (TID) | ORAL | Status: DC | PRN

## 2013-11-17 NOTE — ED Notes (Signed)
Pt alertx4 respirations easy non labored. Skin warm and dry. Steady gait

## 2013-11-17 NOTE — Discharge Instructions (Signed)
Antivert (Meclizine) may help with vertigo. ReCheck with ENT, Dr. Benjamine Mola if not resolving.  Benign Positional Vertigo Vertigo means you feel like you or your surroundings are moving when they are not. Benign positional vertigo is the most common form of vertigo. Benign means that the cause of your condition is not serious. Benign positional vertigo is more common in older adults. CAUSES  Benign positional vertigo is the result of an upset in the labyrinth system. This is an area in the middle ear that helps control your balance. This may be caused by a viral infection, head injury, or repetitive motion. However, often no specific cause is found. SYMPTOMS  Symptoms of benign positional vertigo occur when you move your head or eyes in different directions. Some of the symptoms may include:  Loss of balance and falls.  Vomiting.  Blurred vision.  Dizziness.  Nausea.  Involuntary eye movements (nystagmus). DIAGNOSIS  Benign positional vertigo is usually diagnosed by physical exam. If the specific cause of your benign positional vertigo is unknown, your caregiver may perform imaging tests, such as magnetic resonance imaging (MRI) or computed tomography (CT). TREATMENT  Your caregiver may recommend movements or procedures to correct the benign positional vertigo. Medicines such as meclizine, benzodiazepines, and medicines for nausea may be used to treat your symptoms. In rare cases, if your symptoms are caused by certain conditions that affect the inner ear, you may need surgery. HOME CARE INSTRUCTIONS   Follow your caregiver's instructions.  Move slowly. Do not make sudden body or head movements.  Avoid driving.  Avoid operating heavy machinery.  Avoid performing any tasks that would be dangerous to you or others during a vertigo episode.  Drink enough fluids to keep your urine clear or pale yellow. SEEK IMMEDIATE MEDICAL CARE IF:   You develop problems with walking, weakness,  numbness, or using your arms, hands, or legs.  You have difficulty speaking.  You develop severe headaches.  Your nausea or vomiting continues or gets worse.  You develop visual changes.  Your family or friends notice any behavioral changes.  Your condition gets worse.  You have a fever.  You develop a stiff neck or sensitivity to light. MAKE SURE YOU:   Understand these instructions.  Will watch your condition.  Will get help right away if you are not doing well or get worse. Document Released: 02/12/2006 Document Revised: 07/30/2011 Document Reviewed: 01/25/2011 [facility] Patient Information 2015 [place], [place]. This information is not intended to replace advice given to you by your health care provider. Make sure you discuss any questions you have with your health care provider.

## 2013-11-17 NOTE — ED Notes (Signed)
Pt denies dizziness at this time.

## 2013-11-17 NOTE — ED Provider Notes (Signed)
CSN: [id number]     Arrival date & time 11/17/13  1705 History   First MD Initiated Contact with Patient 11/17/13 1816     Chief Complaint  Patient presents with  . Dizziness      HPI  Patient presents with complaint of dizziness. She states is been going on for "a while". Whenever asked her to specify that she states has been a few months. She see her primary care physician. Also seen by her cardiologist. She states a decrease in her medications and she's got no better. She does not describe orthostasis or near-syncope. She states she feels like things are spinning around she is to hold on to move around in her house. At times when the pain becomes bad she will be nauseated. No ear pain, pressure, decreased hearing, or tenderness. No headache. No vision changes. No chest pain abdominal pain.  Past Medical History  Diagnosis Date  . Hyperlipidemia   . Fatigue   . Anxiety   . Coronary artery disease     a. s/p MI 2009 - PCI/DES distal  RCA w/ 2.75 x 18 Xience DES;  b. 05/2010 Myoview: Apical thinning, EF 73%;  c. 06/2010 Cath: moderate nonobs dzs, EF 65%;  d.  Lexiscan Myoview (07/2013):  No scar or ischemia, EF 67%; Normal Study  . Fibromyalgia   . Essential iris atrophy     Right side  . Menopause   . Glaucoma   . Environmental allergies   . GERD (gastroesophageal reflux disease)   . Osteoarthritis   . OSA (obstructive sleep apnea)   . Obesity (BMI 30-39.9)   . Hypertension    Past Surgical History  Procedure Laterality Date  . Cardiac catheterization  06/29/2010    Mild to moderate coronary artery irregularities.  Her  proximal left anterior descending artery is moderately narrowed.  She  also has an eccentric stenosis in the proximal LAD.  These do not appear  to obstruct flow at present, but they are fairly small vessels.  They  appeared to be between 1.5 and 2 mm in diamete  . Cardiac catheterization  08/02/2009    Patent stent  . Cardiac catheterization  05/12/2008    Stent to  the distal RCA   Family History  Problem Relation Age of Onset  . Heart attack Mother   . Coronary artery disease Brother     had CABG  . Coronary artery disease Brother   . Coronary artery disease Brother   . Coronary artery disease Brother   . Coronary artery disease Brother   . Heart attack Brother   . Cancer Father    History  Substance Use Topics  . Smoking status: Former Smoker    Types: Cigarettes    Quit date: 09/13/1960  . Smokeless tobacco: Never Used  . Alcohol Use: No   OB History   Grav Para Term Preterm Abortions TAB SAB Ect Mult Living                 Review of Systems  Constitutional: Negative for fever, chills, diaphoresis, appetite change and fatigue.  HENT: Negative for mouth sores, sore throat and trouble swallowing.   Eyes: Negative for visual disturbance.  Respiratory: Negative for cough, chest tightness, shortness of breath and wheezing.   Cardiovascular: Negative for chest pain.  Gastrointestinal: Positive for nausea. Negative for vomiting, abdominal pain, diarrhea and abdominal distention.  Endocrine: Negative for polydipsia, polyphagia and polyuria.  Genitourinary: Negative for dysuria, frequency and hematuria.  Musculoskeletal: Negative for gait problem.  Skin: Negative for color change, pallor and rash.  Neurological: Positive for dizziness. Negative for syncope, light-headedness and headaches.  Hematological: Does not bruise/bleed easily.  Psychiatric/Behavioral: Negative for behavioral problems and confusion.      Allergies  Codeine; Cortisone; and Septra  Home Medications   Prior to Admission medications   Medication Sig Start Date End Date Taking? Authorizing Provider  amLODipine (NORVASC) 5 MG tablet Take 1 tablet (5 mg total) by mouth daily. 10/20/13  Yes Thayer Headings, MD  aspirin EC 81 MG tablet Take 81 mg by mouth every morning.   Yes Historical Provider, MD  carvedilol (COREG) 12.5 MG tablet Take 1 tablet (12.5 mg total) by  mouth 2 (two) times daily. 10/20/13 10/20/14 Yes Thayer Headings, MD  clopidogrel (PLAVIX) 75 MG tablet TAKE 1 TABLET BY MOUTH EVERY DAY 03/12/13  Yes Thayer Headings, MD  ferrous sulfate 325 (65 FE) MG tablet Take 325 mg by mouth every morning.    Yes Historical Provider, MD  fluticasone (FLONASE) 50 MCG/ACT nasal spray Place 1 spray into the nose daily as needed for rhinitis or allergies.    Yes Historical Provider, MD  lisinopril (PRINIVIL,ZESTRIL) 40 MG tablet Take 20 mg by mouth every morning. 10/20/13  Yes Thayer Headings, MD  methimazole (TAPAZOLE) 5 MG tablet Take 5 mg by mouth every morning.    Yes Historical Provider, MD  pramipexole (MIRAPEX) 1 MG tablet Take 2 mg by mouth at bedtime.    Yes Historical Provider, MD  rosuvastatin (CRESTOR) 20 MG tablet Take 1 tablet (20 mg total) by mouth every morning. 10/21/13  Yes Thayer Headings, MD  traMADol (ULTRAM) 50 MG tablet Take 50 mg by mouth 3 (three) times daily as needed for pain. For pain   Yes Historical Provider, MD  venlafaxine (EFFEXOR) 75 MG tablet Take 32.5 mg by mouth 2 (two) times daily.    Yes Historical Provider, MD  VOLTAREN 1 % GEL As needed 04/02/13  Yes Historical Provider, MD  meclizine (ANTIVERT) 50 MG tablet Take 1 tablet (50 mg total) by mouth 3 (three) times daily as needed. 11/17/13   Tanna Furry, MD  nitroGLYCERIN (NITROSTAT) 0.4 MG SL tablet Place 0.4 mg under the tongue every 5 (five) minutes x 3 doses as needed for chest pain. For chest pain    Historical Provider, MD  ondansetron (ZOFRAN ODT) 4 MG disintegrating tablet Take 1 tablet (4 mg total) by mouth every 8 (eight) hours as needed for nausea. 11/17/13   Tanna Furry, MD   BP 148/66  Pulse 66  Temp(Src) 98 F (36.7 C) (Oral)  Resp 18  Wt 216 lb (97.977 kg)  SpO2 96% Physical Exam  Constitutional: She is oriented to person, place, and time. She appears well-developed and well-nourished. No distress.  HENT:  Head: Normocephalic.  Eyes: Conjunctivae are normal. Pupils  are equal, round, and reactive to light. No scleral icterus.  She gets worse or nystagmus to lateral gaze it does reproduce her symptoms.  Neck: Normal range of motion. Neck supple. No thyromegaly present.  Cardiovascular: Normal rate and regular rhythm.  Exam reveals no gallop and no friction rub.   No murmur heard. Pulmonary/Chest: Effort normal and breath sounds normal. No respiratory distress. She has no wheezes. She has no rales.  Abdominal: Soft. Bowel sounds are normal. She exhibits no distension. There is no tenderness. There is no rebound.  Musculoskeletal: Normal range of motion.  Neurological: She  is alert and oriented to person, place, and time.  Skin: Skin is warm and dry. No rash noted.  Psychiatric: She has a normal mood and affect. Her behavior is normal.    ED Course  Procedures (including critical care time) Labs Review Labs Reviewed  CBC WITH DIFFERENTIAL - Abnormal; Notable for the following:    Hemoglobin 11.9 (*)    All other components within normal limits  BASIC METABOLIC PANEL - Abnormal; Notable for the following:    Glucose, Bld 107 (*)    Creatinine, Ser 1.26 (*)    GFR calc non Af Amer 42 (*)    GFR calc Af Amer 48 (*)    All other components within normal limits    Imaging Review Ct Head Wo Contrast  11/17/2013   CLINICAL DATA:  Dizziness for several months worse with position changes  EXAM: CT HEAD WITHOUT CONTRAST  TECHNIQUE: Contiguous axial images were obtained from the base of the skull through the vertex without intravenous contrast.  COMPARISON:  09/15/2010  FINDINGS: Stable mild age-appropriate atrophy. Minimal low attenuation in the deep white matter with no evidence of hemorrhage infarct or extra-axial fluid. No evidence of mass or hydrocephalus.  IMPRESSION: Age-appropriate involutional change.   Electronically Signed   By: Skipper Cliche M.D.   On: 11/17/2013 19:15     EKG Interpretation   Date/Time:  Tuesday November 17 2013 17:23:17  EDT Ventricular Rate:  62 PR Interval:  162 QRS Duration: 76 QT Interval:  398 QTC Calculation: 403 R Axis:   69 Text Interpretation:  Normal sinus rhythm Cannot rule out Anterior infarct  , age undetermined Abnormal ECG Confirmed by Jeneen Rinks  MD, [place] ([id number]) on  11/17/2013 6:35:06 PM      MDM   Final diagnoses:  Vertigo    Patient's symptoms somewhat improved, although not resolved. CT scan shows no sign of stroke or significant vascular disease. It is very likely benign positional vertigo. Plan is Antivert, Zofran, ENT or primary care followup if not improving.    Tanna Furry, MD 11/17/13 [phone]

## 2013-11-17 NOTE — ED Notes (Addendum)
Presents with months of dizziness made worse by position changes assocaited with bilateral hand and feet tingling and feeling "hot". These episodes have been getting worse over the last few weeks has been checked by primary care and cardiology. Found to have a drop in thyroid level, but not sure that is causing problem. Pt also reports extreme fatigue. Alert, oriented, MAEx4. Neurologically intact.

## 2013-11-25 ENCOUNTER — Ambulatory Visit (INDEPENDENT_AMBULATORY_CARE_PROVIDER_SITE_OTHER): Payer: Medicare Other | Admitting: Surgery

## 2013-12-01 DIAGNOSIS — M47817 Spondylosis without myelopathy or radiculopathy, lumbosacral region: Secondary | ICD-10-CM | POA: Diagnosis not present

## 2013-12-01 DIAGNOSIS — M545 Low back pain, unspecified: Secondary | ICD-10-CM | POA: Diagnosis not present

## 2013-12-02 ENCOUNTER — Other Ambulatory Visit: Payer: Self-pay | Admitting: Family Medicine

## 2013-12-02 DIAGNOSIS — Z1231 Encounter for screening mammogram for malignant neoplasm of breast: Secondary | ICD-10-CM

## 2013-12-03 DIAGNOSIS — M545 Low back pain, unspecified: Secondary | ICD-10-CM | POA: Diagnosis not present

## 2013-12-03 DIAGNOSIS — M47817 Spondylosis without myelopathy or radiculopathy, lumbosacral region: Secondary | ICD-10-CM | POA: Diagnosis not present

## 2013-12-08 DIAGNOSIS — M545 Low back pain, unspecified: Secondary | ICD-10-CM | POA: Diagnosis not present

## 2013-12-08 DIAGNOSIS — M47817 Spondylosis without myelopathy or radiculopathy, lumbosacral region: Secondary | ICD-10-CM | POA: Diagnosis not present

## 2013-12-10 DIAGNOSIS — M545 Low back pain, unspecified: Secondary | ICD-10-CM | POA: Diagnosis not present

## 2013-12-10 DIAGNOSIS — M47817 Spondylosis without myelopathy or radiculopathy, lumbosacral region: Secondary | ICD-10-CM | POA: Diagnosis not present

## 2013-12-15 ENCOUNTER — Ambulatory Visit
Admission: RE | Admit: 2013-12-15 | Discharge: 2013-12-15 | Disposition: A | Discharge status: Home / Self Care | Payer: Medicare Other | Source: Ambulatory Visit | Attending: Family Medicine | Admitting: Family Medicine

## 2013-12-15 ENCOUNTER — Ambulatory Visit: Payer: Medicare Other

## 2013-12-15 DIAGNOSIS — Z1231 Encounter for screening mammogram for malignant neoplasm of breast: Secondary | ICD-10-CM

## 2013-12-15 DIAGNOSIS — M545 Low back pain, unspecified: Secondary | ICD-10-CM | POA: Diagnosis not present

## 2013-12-15 DIAGNOSIS — M47817 Spondylosis without myelopathy or radiculopathy, lumbosacral region: Secondary | ICD-10-CM | POA: Diagnosis not present

## 2013-12-16 ENCOUNTER — Other Ambulatory Visit: Payer: Self-pay | Admitting: Family Medicine

## 2013-12-16 ENCOUNTER — Ambulatory Visit (INDEPENDENT_AMBULATORY_CARE_PROVIDER_SITE_OTHER): Payer: Medicare Other | Admitting: Surgery

## 2013-12-16 DIAGNOSIS — R928 Other abnormal and inconclusive findings on diagnostic imaging of breast: Secondary | ICD-10-CM

## 2013-12-22 ENCOUNTER — Ambulatory Visit (INDEPENDENT_AMBULATORY_CARE_PROVIDER_SITE_OTHER): Payer: Medicare Other | Admitting: Surgery

## 2013-12-22 ENCOUNTER — Ambulatory Visit
Admission: RE | Admit: 2013-12-22 | Discharge: 2013-12-22 | Disposition: A | Discharge status: Home / Self Care | Payer: Medicare Other | Source: Ambulatory Visit | Attending: Family Medicine | Admitting: Family Medicine

## 2013-12-22 ENCOUNTER — Encounter (INDEPENDENT_AMBULATORY_CARE_PROVIDER_SITE_OTHER): Payer: Self-pay

## 2013-12-22 DIAGNOSIS — M545 Low back pain, unspecified: Secondary | ICD-10-CM | POA: Diagnosis not present

## 2013-12-22 DIAGNOSIS — R928 Other abnormal and inconclusive findings on diagnostic imaging of breast: Secondary | ICD-10-CM

## 2013-12-22 DIAGNOSIS — M47817 Spondylosis without myelopathy or radiculopathy, lumbosacral region: Secondary | ICD-10-CM | POA: Diagnosis not present

## 2013-12-22 DIAGNOSIS — N6489 Other specified disorders of breast: Secondary | ICD-10-CM | POA: Diagnosis not present

## 2013-12-29 DIAGNOSIS — M47817 Spondylosis without myelopathy or radiculopathy, lumbosacral region: Secondary | ICD-10-CM | POA: Diagnosis not present

## 2013-12-29 DIAGNOSIS — M545 Low back pain, unspecified: Secondary | ICD-10-CM | POA: Diagnosis not present

## 2013-12-31 DIAGNOSIS — M545 Low back pain, unspecified: Secondary | ICD-10-CM | POA: Diagnosis not present

## 2013-12-31 DIAGNOSIS — M47817 Spondylosis without myelopathy or radiculopathy, lumbosacral region: Secondary | ICD-10-CM | POA: Diagnosis not present

## 2014-01-04 ENCOUNTER — Ambulatory Visit (HOSPITAL_COMMUNITY)
Admission: RE | Admit: 2014-01-04 | Discharge: 2014-01-04 | Disposition: A | Discharge status: Home / Self Care | Payer: Medicare Other | Source: Ambulatory Visit | Attending: Family Medicine | Admitting: Family Medicine

## 2014-01-04 ENCOUNTER — Other Ambulatory Visit (HOSPITAL_COMMUNITY): Payer: Self-pay | Admitting: Family Medicine

## 2014-01-04 DIAGNOSIS — M25569 Pain in unspecified knee: Secondary | ICD-10-CM

## 2014-01-04 DIAGNOSIS — M79609 Pain in unspecified limb: Secondary | ICD-10-CM | POA: Diagnosis not present

## 2014-01-04 NOTE — Progress Notes (Signed)
VASCULAR LAB PRELIMINARY  PRELIMINARY  PRELIMINARY  PRELIMINARY  Left lower extremity venous Doppler completed.    Preliminary report:  There is no DVT or SVT noted in the left lower extremity.   Jaiveon Suppes, RVT 01/04/2014, 4:13 PM

## 2014-01-07 ENCOUNTER — Other Ambulatory Visit: Payer: Self-pay | Admitting: Family Medicine

## 2014-01-07 DIAGNOSIS — M25562 Pain in left knee: Secondary | ICD-10-CM

## 2014-01-11 ENCOUNTER — Ambulatory Visit
Admission: RE | Admit: 2014-01-11 | Discharge: 2014-01-11 | Disposition: A | Discharge status: Home / Self Care | Payer: Medicare Other | Source: Ambulatory Visit | Attending: Family Medicine | Admitting: Family Medicine

## 2014-01-11 DIAGNOSIS — M25562 Pain in left knee: Secondary | ICD-10-CM

## 2014-01-11 DIAGNOSIS — M1710 Unilateral primary osteoarthritis, unspecified knee: Secondary | ICD-10-CM | POA: Diagnosis not present

## 2014-01-11 DIAGNOSIS — IMO0002 Reserved for concepts with insufficient information to code with codable children: Secondary | ICD-10-CM | POA: Diagnosis not present

## 2014-01-12 ENCOUNTER — Ambulatory Visit (INDEPENDENT_AMBULATORY_CARE_PROVIDER_SITE_OTHER): Payer: Medicare Other | Admitting: Surgery

## 2014-01-12 ENCOUNTER — Encounter (INDEPENDENT_AMBULATORY_CARE_PROVIDER_SITE_OTHER): Payer: Self-pay | Admitting: Surgery

## 2014-01-12 VITALS — BP 128/62 | HR 66 | Temp 98.0°F | Ht 66.0 in | Wt 213.5 lb

## 2014-01-12 DIAGNOSIS — E0590 Thyrotoxicosis, unspecified without thyrotoxic crisis or storm: Secondary | ICD-10-CM

## 2014-01-12 DIAGNOSIS — E042 Nontoxic multinodular goiter: Secondary | ICD-10-CM | POA: Diagnosis not present

## 2014-01-12 DIAGNOSIS — I2510 Atherosclerotic heart disease of native coronary artery without angina pectoris: Secondary | ICD-10-CM

## 2014-01-12 NOTE — Progress Notes (Signed)
General Surgery [facility]  Chief Complaint  Patient presents with  . New Evaluation    evaluate thyroid nodules - patient is self-referred    HISTORY: Patient is a 73 year old female who is self-referred for management of thyroid disease. Patient has a long-standing history of thyroid nodules. She had a thyroid ultrasound performed in 2007. This was following an incidental finding of thyroid nodules on an MRI scan of the cervical spine. Ultrasound in 2007 showed a right lobe measuring 4.9 x 1.5 x 1.6 cm. Left lobe measured 5.9 x 1.6 x 2.0 cm. Nodules were present bilaterally with the largest nodule on the right measuring 2.0 cm in the largest nodule in the left measuring 1.3 cm. No further evaluation was performed at that time.  Patient subsequently had a nuclear medicine thyroid scan in June of 2012. This showed slightly decreased uptake in the thyroid gland with nodularity consistent with multinodular goiter. At that time the patient was started on low-dose Tapazole 5 mg daily which she has taken until 2 weeks ago.  Laboratory studies provided from the office of her primary care physician showed TSH levels over the past 2 years ranging from 0.87 to 2.23. TSH has never been out of the normal range.  Patient has had no prior head or neck surgery. She notes a family history of goiter and a maternal grandmother. There is no family history of endocrine neoplasms.  Past Medical History  Diagnosis Date  . Hyperlipidemia   . Fatigue   . Anxiety   . Coronary artery disease     a. s/p MI 2009 - PCI/DES distal  RCA w/ 2.75 x 18 Xience DES;  b. 05/2010 Myoview: Apical thinning, EF 73%;  c. 06/2010 Cath: moderate nonobs dzs, EF 65%;  d.  Lexiscan Myoview (07/2013):  No scar or ischemia, EF 67%; Normal Study  . Fibromyalgia   . Essential iris atrophy     Right side  . Menopause   . Glaucoma   . Environmental allergies   . GERD (gastroesophageal reflux disease)   .  Osteoarthritis   . OSA (obstructive sleep apnea)   . Obesity (BMI 30-39.9)   . Hypertension     Current Outpatient Prescriptions  Medication Sig Dispense Refill  . amLODipine (NORVASC) 5 MG tablet Take 1 tablet (5 mg total) by mouth daily.  90 tablet  3  . aspirin EC 81 MG tablet Take 81 mg by mouth every morning.      . carvedilol (COREG) 12.5 MG tablet Take 1 tablet (12.5 mg total) by mouth 2 (two) times daily.  180 tablet  3  . clopidogrel (PLAVIX) 75 MG tablet TAKE 1 TABLET BY MOUTH EVERY DAY  90 tablet  2  . ferrous sulfate 325 (65 FE) MG tablet Take 325 mg by mouth every morning.       . fluticasone (FLONASE) 50 MCG/ACT nasal spray Place 1 spray into the nose daily as needed for rhinitis or allergies.       Marland Kitchen HYDROcodone-acetaminophen (NORCO/VICODIN) 5-325 MG per tablet       . lisinopril (PRINIVIL,ZESTRIL) 40 MG tablet Take 20 mg by mouth every morning.      . nitrofurantoin (MACRODANTIN) 100 MG capsule       . nitroGLYCERIN (NITROSTAT) 0.4 MG SL tablet Place 0.4 mg under the tongue every 5 (five) minutes x 3 doses as needed for chest pain. For chest pain      . pantoprazole (PROTONIX) 40 MG tablet       .  pramipexole (MIRAPEX) 1 MG tablet Take 2 mg by mouth at bedtime.       . rosuvastatin (CRESTOR) 20 MG tablet Take 1 tablet (20 mg total) by mouth every morning.  90 tablet  3  . traMADol (ULTRAM) 50 MG tablet Take 50 mg by mouth 3 (three) times daily as needed for pain. For pain      . venlafaxine (EFFEXOR) 75 MG tablet Take 32.5 mg by mouth 2 (two) times daily.       . VOLTAREN 1 % GEL As needed      . meclizine (ANTIVERT) 50 MG tablet Take 1 tablet (50 mg total) by mouth 3 (three) times daily as needed.  30 tablet  0  . methimazole (TAPAZOLE) 5 MG tablet Take 5 mg by mouth every morning.       . ondansetron (ZOFRAN ODT) 4 MG disintegrating tablet Take 1 tablet (4 mg total) by mouth every 8 (eight) hours as needed for nausea.  20 tablet  0   No current facility-administered  medications for this visit.    Allergies  Allergen Reactions  . Codeine Nausea And Vomiting and Other (See Comments)    Pt gets very hot and flushed  . Cortisone Nausea Only and Other (See Comments)    Pt gets very hot and flushed  . Septra [Sulfamethoxazole-Tmp Ds]     Family History  Problem Relation Age of Onset  . Heart attack Mother   . Coronary artery disease Brother     had CABG  . Coronary artery disease Brother   . Coronary artery disease Brother   . Coronary artery disease Brother   . Coronary artery disease Brother   . Heart attack Brother   . Cancer Father     History   Social History  . Marital Status: Divorced    Spouse Name: N/A    Number of Children: N/A  . Years of Education: N/A   Social History Main Topics  . Smoking status: Former Smoker    Types: Cigarettes    Quit date: 09/13/1960  . Smokeless tobacco: Never Used  . Alcohol Use: No  . Drug Use: No  . Sexual Activity: None   Other Topics Concern  . None   Social History Narrative  . None    REVIEW OF SYSTEMS - PERTINENT POSITIVES ONLY: Patient complains of chronic fatigue. She has occasional palpitations. She has occasional night sweats. She denies tremor. She denies compressive symptoms.  EXAM: Filed Vitals:   01/12/14 1408  BP: 128/62  Pulse: 66  Temp: 98 F (36.7 C)    GENERAL: well-developed, well-nourished, no acute distress HEENT: normocephalic; pupils equal and reactive; sclerae clear; dentition good; mucous membranes moist NECK:  Slight nodularity in the left thyroid lobe without dominant mass on either side of the thyroid bed; symmetric on extension; no palpable anterior or posterior cervical lymphadenopathy; no supraclavicular masses; no tenderness CHEST: clear to auscultation bilaterally without rales, rhonchi, or wheezes CARDIAC: regular rate and rhythm without significant murmur; peripheral pulses are full EXT:  non-tender without edema; no deformity NEURO: no gross  focal deficits; no sign of tremor   LABORATORY RESULTS: See [facility] (CHL-Epic) for most recent results  RADIOLOGY RESULTS: See [facility] (CHL-Epic) for most recent results  IMPRESSION: #1 bilateral thyroid nodules #2 past history of borderline hyperthyroidism, on Tapazole  PLAN: The patient and I reviewed all of the above studies. We discussed these findings. We reviewed her medications.  I would like  to repeat the patient's thyroid ultrasound to document stability of the bilateral thyroid nodules. Certainly there are no worrisome findings on today's physical examination.  I have recommended referral to endocrinology for further evaluation. I think it is likely that she does not require the low-dose Tapazole at this time. We will make referral to Dr. Dagmar Hait for evaluation in the near future.  We will contact the patient with the results of her ultrasound.  Earnstine Regal, MD, [facility]  Primary Care Physician: Donnie Coffin, MD

## 2014-01-12 NOTE — Patient Instructions (Signed)
Thyroid-Stimulating Hormone A thyroid-stimulating hormone (TSH) test is a blood test that is done to measure the level of TSH, also known as thyrotropin, in your blood. Knowing the level of TSH in your blood can help your health care provider:  Diagnose a thyroid gland or pituitary gland disorder.  Manage your condition and treatment if you have hypothyroidism or hyperthyroidism. TSH is produced by the pituitary gland. The pituitary gland is a small organ located just below the brain, behind your eyes and nasal passages. It is part of a system that monitors and maintains thyroid hormone levels and thyroid gland function. Thyroid hormones affect many body parts and systems, including the system that affects how quickly your body burns fuel for energy. Your health care provider may recommend testing your TSH level if you have signs and symptoms that are often associated with abnormal thyroid hormone levels. The blood used for testing is obtained through a needle placed in a vein in your arm. RESULTS It is your responsibility to obtain your test results. Ask the lab or department performing the test when and how you will get your results. Contact your health care provider to discuss any questions you have about your results.  Your health care provider will go over the test results with you and discuss the importance and meaning of your results, as well as the need for additional tests if necessary. Range of Normal Values  Ranges for normal values may vary among different labs and hospitals. You should always check with your health care provider after having lab work or other tests done to discuss whether your values are considered within normal limits.  Adult: 0.3-5 microU/mL or 0.3-5 mU/L (SI units).  Newborn: 3-18 microU/mL or 3-18 mU/L.  Cord: 3-12 microU/mL or 3-12 mU/L. Meaning of Results Outside Normal Value Ranges A high level of TSH may mean:  Your thyroid gland is not making enough  thyroid hormones. When the thyroid gland does not make enough thyroid hormones, the pituitary gland releases TSH into the bloodstream. The higher-than-normal levels of TSH prompt the thyroid gland to release more thyroid hormones.  You are getting an insufficient level of thyroid hormone medicine, if you are receiving this type of treatment.  There is a problem with the pituitary gland (rare). A low level of TSH can indicate a problem with the pituitary gland. Document Released: 06/01/2004 Document Revised: 09/21/2013 Document Reviewed: 04/18/2008 ExitCare Patient Information 2015 [employer]. This information is not intended to replace advice given to you by your health care provider. Make sure you discuss any questions you have with your health care provider.  

## 2014-01-13 ENCOUNTER — Encounter (INDEPENDENT_AMBULATORY_CARE_PROVIDER_SITE_OTHER): Payer: Self-pay

## 2014-01-13 ENCOUNTER — Other Ambulatory Visit: Payer: Medicare Other

## 2014-01-14 ENCOUNTER — Other Ambulatory Visit: Payer: Medicare Other

## 2014-01-14 ENCOUNTER — Ambulatory Visit
Admission: RE | Admit: 2014-01-14 | Discharge: 2014-01-14 | Disposition: A | Discharge status: Home / Self Care | Payer: Medicare Other | Source: Ambulatory Visit | Attending: Surgery | Admitting: Surgery

## 2014-01-14 ENCOUNTER — Other Ambulatory Visit (INDEPENDENT_AMBULATORY_CARE_PROVIDER_SITE_OTHER): Payer: Self-pay | Admitting: Surgery

## 2014-01-14 DIAGNOSIS — E042 Nontoxic multinodular goiter: Secondary | ICD-10-CM | POA: Diagnosis not present

## 2014-01-15 ENCOUNTER — Other Ambulatory Visit: Payer: Self-pay | Admitting: Cardiovascular Disease

## 2014-01-18 DIAGNOSIS — M79609 Pain in unspecified limb: Secondary | ICD-10-CM | POA: Diagnosis not present

## 2014-01-18 DIAGNOSIS — M7120 Synovial cyst of popliteal space [Baker], unspecified knee: Secondary | ICD-10-CM | POA: Diagnosis not present

## 2014-01-18 DIAGNOSIS — M25569 Pain in unspecified knee: Secondary | ICD-10-CM | POA: Diagnosis not present

## 2014-01-18 NOTE — Progress Notes (Signed)
Quick Note:  Dominant nodules have enlarged since prior study in 2007.  Patient should be in the process of seeing Dr. Dagmar Hait in consultation. May need bilateral FNA biopsies after endocrine consultation. Let's see what Dr. Buddy Duty has to say first.  Earnstine Regal, MD, [facility] Office: [phone]   ______

## 2014-01-22 ENCOUNTER — Other Ambulatory Visit (INDEPENDENT_AMBULATORY_CARE_PROVIDER_SITE_OTHER): Payer: Medicare Other

## 2014-01-22 DIAGNOSIS — I2510 Atherosclerotic heart disease of native coronary artery without angina pectoris: Secondary | ICD-10-CM | POA: Diagnosis not present

## 2014-01-22 LAB — BASIC METABOLIC PANEL
BUN: 22 mg/dL (ref 6–23)
CALCIUM: 9.2 mg/dL (ref 8.4–10.5)
CO2: 29 mEq/L (ref 19–32)
Chloride: 104 mEq/L (ref 96–112)
Creatinine, Ser: 1.1 mg/dL (ref 0.4–1.2)
GFR: 51.93 mL/min — ABNORMAL LOW (ref >60.00)
GLUCOSE: 75 mg/dL (ref 70–99)
Potassium: 4.6 mEq/L (ref 3.5–5.1)
Sodium: 137 mEq/L (ref 135–145)

## 2014-01-22 LAB — LIPID PANEL
CHOLESTEROL: 162 mg/dL (ref 0–200)
HDL: 43.9 mg/dL (ref >39.00)
LDL CALC: 98 mg/dL (ref 0–99)
NonHDL: 118.1
TRIGLYCERIDES: 101 mg/dL (ref 0.0–149.0)
Total CHOL/HDL Ratio: 4
VLDL: 20.2 mg/dL (ref 0.0–40.0)

## 2014-01-22 LAB — HEPATIC FUNCTION PANEL
ALT: 131 U/L — AB (ref 0–35)
AST: 78 U/L — AB (ref 0–37)
Albumin: 3.5 g/dL (ref 3.5–5.2)
Alkaline Phosphatase: 98 U/L (ref 39–117)
BILIRUBIN TOTAL: 0.6 mg/dL (ref 0.2–1.2)
Bilirubin, Direct: 0 mg/dL (ref 0.0–0.3)
Total Protein: 7.5 g/dL (ref 6.0–8.3)

## 2014-02-02 DIAGNOSIS — E0590 Thyrotoxicosis, unspecified without thyrotoxic crisis or storm: Secondary | ICD-10-CM | POA: Diagnosis not present

## 2014-02-02 DIAGNOSIS — D649 Anemia, unspecified: Secondary | ICD-10-CM | POA: Diagnosis not present

## 2014-02-02 DIAGNOSIS — Z79899 Other long term (current) drug therapy: Secondary | ICD-10-CM | POA: Diagnosis not present

## 2014-02-02 DIAGNOSIS — Z23 Encounter for immunization: Secondary | ICD-10-CM | POA: Diagnosis not present

## 2014-02-02 DIAGNOSIS — E042 Nontoxic multinodular goiter: Secondary | ICD-10-CM | POA: Diagnosis not present

## 2014-02-02 DIAGNOSIS — R5383 Other fatigue: Secondary | ICD-10-CM | POA: Diagnosis not present

## 2014-02-02 DIAGNOSIS — R5381 Other malaise: Secondary | ICD-10-CM | POA: Diagnosis not present

## 2014-02-16 DIAGNOSIS — M25569 Pain in unspecified knee: Secondary | ICD-10-CM | POA: Diagnosis not present

## 2014-02-16 DIAGNOSIS — M25579 Pain in unspecified ankle and joints of unspecified foot: Secondary | ICD-10-CM | POA: Diagnosis not present

## 2014-02-22 ENCOUNTER — Other Ambulatory Visit (INDEPENDENT_AMBULATORY_CARE_PROVIDER_SITE_OTHER): Payer: Self-pay

## 2014-02-22 DIAGNOSIS — E042 Nontoxic multinodular goiter: Secondary | ICD-10-CM

## 2014-02-23 ENCOUNTER — Other Ambulatory Visit: Payer: Self-pay | Admitting: Internal Medicine

## 2014-02-23 DIAGNOSIS — E0590 Thyrotoxicosis, unspecified without thyrotoxic crisis or storm: Secondary | ICD-10-CM

## 2014-02-28 DIAGNOSIS — M19072 Primary osteoarthritis, left ankle and foot: Secondary | ICD-10-CM | POA: Diagnosis not present

## 2014-02-28 DIAGNOSIS — M1712 Unilateral primary osteoarthritis, left knee: Secondary | ICD-10-CM | POA: Diagnosis not present

## 2014-03-02 ENCOUNTER — Other Ambulatory Visit (INDEPENDENT_AMBULATORY_CARE_PROVIDER_SITE_OTHER): Payer: Medicare Other | Admitting: *Deleted

## 2014-03-02 DIAGNOSIS — E785 Hyperlipidemia, unspecified: Secondary | ICD-10-CM | POA: Diagnosis not present

## 2014-03-02 DIAGNOSIS — M94262 Chondromalacia, left knee: Secondary | ICD-10-CM | POA: Diagnosis not present

## 2014-03-02 DIAGNOSIS — M1712 Unilateral primary osteoarthritis, left knee: Secondary | ICD-10-CM | POA: Diagnosis not present

## 2014-03-02 DIAGNOSIS — M25572 Pain in left ankle and joints of left foot: Secondary | ICD-10-CM | POA: Diagnosis not present

## 2014-03-02 LAB — HEPATIC FUNCTION PANEL
ALK PHOS: 96 U/L (ref 39–117)
ALT: 55 U/L — AB (ref 0–35)
AST: 58 U/L — AB (ref 0–37)
Albumin: 3.1 g/dL — ABNORMAL LOW (ref 3.5–5.2)
BILIRUBIN TOTAL: 0.6 mg/dL (ref 0.2–1.2)
Bilirubin, Direct: 0 mg/dL (ref 0.0–0.3)
Total Protein: 7.8 g/dL (ref 6.0–8.3)

## 2014-03-02 LAB — LIPID PANEL
CHOLESTEROL: 232 mg/dL — AB (ref 0–200)
HDL: 36.2 mg/dL — AB (ref >39.00)
LDL Cholesterol: 171 mg/dL — ABNORMAL HIGH (ref 0–99)
NONHDL: 195.8
Total CHOL/HDL Ratio: 6
Triglycerides: 126 mg/dL (ref 0.0–149.0)
VLDL: 25.2 mg/dL (ref 0.0–40.0)

## 2014-03-03 ENCOUNTER — Telehealth: Payer: Self-pay

## 2014-03-03 DIAGNOSIS — M5137 Other intervertebral disc degeneration, lumbosacral region: Secondary | ICD-10-CM | POA: Diagnosis not present

## 2014-03-03 DIAGNOSIS — M5136 Other intervertebral disc degeneration, lumbar region: Secondary | ICD-10-CM | POA: Diagnosis not present

## 2014-03-03 DIAGNOSIS — M545 Low back pain: Secondary | ICD-10-CM | POA: Diagnosis not present

## 2014-03-03 DIAGNOSIS — G894 Chronic pain syndrome: Secondary | ICD-10-CM | POA: Diagnosis not present

## 2014-03-03 NOTE — Telephone Encounter (Signed)
Called patient to inform her of elevated cholesterol. Patient st she STOPPED Crestor in September because her liver enzymes were elevated.   Routing to [employer] for review and recommendations.

## 2014-03-08 DIAGNOSIS — M1712 Unilateral primary osteoarthritis, left knee: Secondary | ICD-10-CM | POA: Diagnosis not present

## 2014-03-08 NOTE — Telephone Encounter (Signed)
Refer to lipid clinic 

## 2014-03-09 NOTE — Telephone Encounter (Signed)
Spoke with patient and advised Dr. Acie Fredrickson would like to refer patient to lipid clinic.  Patient is agreeable to plan and appointment made with patient's awareness of date and time.

## 2014-03-11 ENCOUNTER — Encounter (HOSPITAL_COMMUNITY)
Admission: RE | Admit: 2014-03-11 | Discharge: 2014-03-11 | Disposition: A | Discharge status: Home / Self Care | Payer: Medicare Other | Source: Ambulatory Visit | Attending: Diagnostic Radiology | Admitting: Diagnostic Radiology

## 2014-03-11 DIAGNOSIS — E0590 Thyrotoxicosis, unspecified without thyrotoxic crisis or storm: Secondary | ICD-10-CM | POA: Insufficient documentation

## 2014-03-11 DIAGNOSIS — R946 Abnormal results of thyroid function studies: Secondary | ICD-10-CM | POA: Insufficient documentation

## 2014-03-11 MED ORDER — SODIUM IODIDE I 131 CAPSULE
12.7000 | Freq: Once | INTRAVENOUS | Status: AC | PRN
  Administered 2014-03-11: 12.7 via ORAL

## 2014-03-12 ENCOUNTER — Encounter (HOSPITAL_COMMUNITY)
Admission: RE | Admit: 2014-03-12 | Discharge: 2014-03-12 | Disposition: A | Discharge status: Home / Self Care | Payer: Medicare Other | Source: Ambulatory Visit | Attending: Internal Medicine | Admitting: Internal Medicine

## 2014-03-12 DIAGNOSIS — E0590 Thyrotoxicosis, unspecified without thyrotoxic crisis or storm: Secondary | ICD-10-CM | POA: Diagnosis not present

## 2014-03-12 DIAGNOSIS — R946 Abnormal results of thyroid function studies: Secondary | ICD-10-CM | POA: Diagnosis not present

## 2014-03-15 ENCOUNTER — Ambulatory Visit (HOSPITAL_COMMUNITY)
Admission: RE | Admit: 2014-03-15 | Discharge: 2014-03-15 | Disposition: A | Discharge status: Home / Self Care | Payer: Medicare Other | Source: Ambulatory Visit | Attending: Family Medicine | Admitting: Family Medicine

## 2014-03-15 ENCOUNTER — Other Ambulatory Visit (HOSPITAL_COMMUNITY): Payer: Self-pay | Admitting: Family Medicine

## 2014-03-15 DIAGNOSIS — E6609 Other obesity due to excess calories: Secondary | ICD-10-CM | POA: Diagnosis not present

## 2014-03-15 DIAGNOSIS — M1712 Unilateral primary osteoarthritis, left knee: Secondary | ICD-10-CM | POA: Diagnosis not present

## 2014-03-15 DIAGNOSIS — M7989 Other specified soft tissue disorders: Principal | ICD-10-CM

## 2014-03-15 DIAGNOSIS — M79662 Pain in left lower leg: Secondary | ICD-10-CM | POA: Diagnosis not present

## 2014-03-15 DIAGNOSIS — E669 Obesity, unspecified: Secondary | ICD-10-CM

## 2014-03-15 DIAGNOSIS — M79609 Pain in unspecified limb: Secondary | ICD-10-CM | POA: Diagnosis not present

## 2014-03-15 NOTE — Progress Notes (Signed)
Left lower extremity venous duplex completed.  Left:  No evidence of DVT or superficial thrombosis.  There appears to be a Baker's cyst in the left popliteal fossa.  Right:  Negative for DVT in the common femoral vein.  

## 2014-03-16 ENCOUNTER — Institutional Professional Consult (permissible substitution): Payer: Medicare Other | Admitting: Pharmacist

## 2014-03-19 ENCOUNTER — Institutional Professional Consult (permissible substitution): Payer: Medicare Other | Admitting: Pharmacist

## 2014-03-23 ENCOUNTER — Encounter: Payer: Self-pay | Admitting: Nurse Practitioner

## 2014-03-23 ENCOUNTER — Other Ambulatory Visit (HOSPITAL_COMMUNITY): Payer: Self-pay | Admitting: Internal Medicine

## 2014-03-23 DIAGNOSIS — M1712 Unilateral primary osteoarthritis, left knee: Secondary | ICD-10-CM | POA: Diagnosis not present

## 2014-03-23 DIAGNOSIS — E0590 Thyrotoxicosis, unspecified without thyrotoxic crisis or storm: Secondary | ICD-10-CM

## 2014-03-26 DIAGNOSIS — M5136 Other intervertebral disc degeneration, lumbar region: Secondary | ICD-10-CM | POA: Diagnosis not present

## 2014-03-26 DIAGNOSIS — M5126 Other intervertebral disc displacement, lumbar region: Secondary | ICD-10-CM | POA: Diagnosis not present

## 2014-03-26 DIAGNOSIS — M4316 Spondylolisthesis, lumbar region: Secondary | ICD-10-CM | POA: Diagnosis not present

## 2014-03-26 DIAGNOSIS — M47817 Spondylosis without myelopathy or radiculopathy, lumbosacral region: Secondary | ICD-10-CM | POA: Diagnosis not present

## 2014-03-26 DIAGNOSIS — M47816 Spondylosis without myelopathy or radiculopathy, lumbar region: Secondary | ICD-10-CM | POA: Diagnosis not present

## 2014-03-31 DIAGNOSIS — M1712 Unilateral primary osteoarthritis, left knee: Secondary | ICD-10-CM | POA: Diagnosis not present

## 2014-04-02 ENCOUNTER — Encounter (HOSPITAL_COMMUNITY)
Admission: RE | Admit: 2014-04-02 | Discharge: 2014-04-02 | Disposition: A | Discharge status: Home / Self Care | Payer: Medicare Other | Source: Ambulatory Visit | Attending: Diagnostic Radiology | Admitting: Diagnostic Radiology

## 2014-04-02 DIAGNOSIS — E0590 Thyrotoxicosis, unspecified without thyrotoxic crisis or storm: Secondary | ICD-10-CM | POA: Insufficient documentation

## 2014-04-02 DIAGNOSIS — E039 Hypothyroidism, unspecified: Secondary | ICD-10-CM | POA: Diagnosis not present

## 2014-04-02 MED ORDER — SODIUM IODIDE I 131 CAPSULE
32.4000 | Freq: Once | INTRAVENOUS | Status: AC | PRN
  Administered 2014-04-02: 32.4 via ORAL

## 2014-04-06 DIAGNOSIS — G2581 Restless legs syndrome: Secondary | ICD-10-CM | POA: Diagnosis not present

## 2014-04-06 DIAGNOSIS — M545 Low back pain: Secondary | ICD-10-CM | POA: Diagnosis not present

## 2014-04-06 DIAGNOSIS — F411 Generalized anxiety disorder: Secondary | ICD-10-CM | POA: Diagnosis not present

## 2014-04-06 DIAGNOSIS — K219 Gastro-esophageal reflux disease without esophagitis: Secondary | ICD-10-CM | POA: Diagnosis not present

## 2014-04-06 DIAGNOSIS — I10 Essential (primary) hypertension: Secondary | ICD-10-CM | POA: Diagnosis not present

## 2014-04-06 DIAGNOSIS — G894 Chronic pain syndrome: Secondary | ICD-10-CM | POA: Diagnosis not present

## 2014-04-06 DIAGNOSIS — M5417 Radiculopathy, lumbosacral region: Secondary | ICD-10-CM | POA: Diagnosis not present

## 2014-04-06 DIAGNOSIS — M5137 Other intervertebral disc degeneration, lumbosacral region: Secondary | ICD-10-CM | POA: Diagnosis not present

## 2014-04-06 DIAGNOSIS — M4727 Other spondylosis with radiculopathy, lumbosacral region: Secondary | ICD-10-CM | POA: Diagnosis not present

## 2014-04-06 DIAGNOSIS — M4806 Spinal stenosis, lumbar region: Secondary | ICD-10-CM | POA: Diagnosis not present

## 2014-04-06 DIAGNOSIS — Z1211 Encounter for screening for malignant neoplasm of colon: Secondary | ICD-10-CM | POA: Diagnosis not present

## 2014-04-22 ENCOUNTER — Ambulatory Visit (INDEPENDENT_AMBULATORY_CARE_PROVIDER_SITE_OTHER): Payer: Medicare Other | Admitting: Neurology

## 2014-04-22 ENCOUNTER — Encounter (INDEPENDENT_AMBULATORY_CARE_PROVIDER_SITE_OTHER): Payer: Self-pay | Admitting: Neurology

## 2014-04-22 DIAGNOSIS — M25569 Pain in unspecified knee: Secondary | ICD-10-CM

## 2014-04-22 DIAGNOSIS — Z0289 Encounter for other administrative examinations: Secondary | ICD-10-CM

## 2014-04-22 DIAGNOSIS — G90522 Complex regional pain syndrome I of left lower limb: Secondary | ICD-10-CM | POA: Diagnosis not present

## 2014-04-22 NOTE — Procedures (Signed)
HISTORY:  [name] is a 73 year old patient with a history of chronic low back pain. The patient has had a left Baker's cyst, and she has had increased pain in the left lower leg with some radiation up the leg to the thigh. The patient has some pain as well on the right leg, but to a lesser degree. She is being evaluated for possible neuropathy or a lumbosacral radiculopathy.  NERVE CONDUCTION STUDIES:  Nerve conduction studies were performed on the left upper extremity. The distal motor latencies and motor amplitudes for the median and ulnar nerves were within normal limits. The F wave latencies and nerve conduction velocities for these nerves were also normal. The sensory latencies for the median and ulnar nerves were normal.  Nerve conduction studies were performed on both lower extremities. The distal motor latencies and motor amplitudes for the peroneal and posterior tibial nerves were within normal limits. The nerve conduction velocities for these nerves were also normal. The H reflex latencies were normal. The sensory latencies for the peroneal nerves and sural nerves were within normal limits on the right, absent on the left.   EMG STUDIES:  EMG study was performed on the left lower extremity:  The tibialis anterior muscle reveals 2 to 4K motor units with full recruitment. No fibrillations or positive waves were seen. The peroneus tertius muscle reveals 2 to 4K motor units with full recruitment. No fibrillations or positive waves were seen. The medial gastrocnemius muscle reveals 1 to 3K motor units with full recruitment. No fibrillations or positive waves were seen. The vastus lateralis muscle reveals 2 to 4K motor units with full recruitment. No fibrillations or positive waves were seen. The iliopsoas muscle reveals 2 to 4K motor units with full recruitment. No fibrillations or positive waves were seen. The biceps femoris muscle (long head) reveals 2 to 4K motor units with  full recruitment. No fibrillations or positive waves were seen. The lumbosacral paraspinal muscles were tested at 3 levels, and revealed no abnormalities of insertional activity at all 3 levels tested. There was good relaxation.  EMG study was performed on the right lower extremity:  The tibialis anterior muscle reveals 2 to 4K motor units with full recruitment. No fibrillations or positive waves were seen. The peroneus tertius muscle reveals 2 to 4K motor units with full recruitment. No fibrillations or positive waves were seen. The medial gastrocnemius muscle reveals 1 to 3K motor units with full recruitment. No fibrillations or positive waves were seen. The vastus lateralis muscle reveals 2 to 4K motor units with full recruitment. No fibrillations or positive waves were seen. The iliopsoas muscle reveals 2 to 4K motor units with full recruitment. No fibrillations or positive waves were seen. The biceps femoris muscle (long head) reveals 2 to 4K motor units with full recruitment. No fibrillations or positive waves were seen. Occasional complex repetitive discharges were seen. The lumbosacral paraspinal muscles were tested at 3 levels, and revealed no abnormalities of insertional activity at all 3 levels tested. There was good relaxation.   IMPRESSION:  Nerve conduction studies done on the left upper extremity and both lower extremities were relatively unremarkable with exception that the left sural and peroneal sensory latencies were unobtainable. EMG evaluation of both lower extremities were unremarkable, without evidence of an overlying lumbosacral radiculopathy. The absence of the sural and peroneal sensory latencies on the left could potentially relate to low-grade nerve compression in the popliteal fossa from the Baker's cyst. Clinical correlation is required.  Jill Alexanders MD 04/22/2014 4:44 PM  [facility] [address] [place], [place]  [address]  Phone [phone] Fax [phone]

## 2014-05-04 DIAGNOSIS — G894 Chronic pain syndrome: Secondary | ICD-10-CM | POA: Diagnosis not present

## 2014-05-04 DIAGNOSIS — M4806 Spinal stenosis, lumbar region: Secondary | ICD-10-CM | POA: Diagnosis not present

## 2014-05-04 DIAGNOSIS — M47816 Spondylosis without myelopathy or radiculopathy, lumbar region: Secondary | ICD-10-CM | POA: Diagnosis not present

## 2014-05-04 DIAGNOSIS — M545 Low back pain: Secondary | ICD-10-CM | POA: Diagnosis not present

## 2014-05-04 DIAGNOSIS — M7122 Synovial cyst of popliteal space [Baker], left knee: Secondary | ICD-10-CM | POA: Diagnosis not present

## 2014-05-04 DIAGNOSIS — M5137 Other intervertebral disc degeneration, lumbosacral region: Secondary | ICD-10-CM | POA: Diagnosis not present

## 2014-05-19 DIAGNOSIS — E0590 Thyrotoxicosis, unspecified without thyrotoxic crisis or storm: Secondary | ICD-10-CM | POA: Diagnosis not present

## 2014-06-02 DIAGNOSIS — M7122 Synovial cyst of popliteal space [Baker], left knee: Secondary | ICD-10-CM | POA: Diagnosis not present

## 2014-06-17 DIAGNOSIS — E0590 Thyrotoxicosis, unspecified without thyrotoxic crisis or storm: Secondary | ICD-10-CM | POA: Diagnosis not present

## 2014-06-22 DIAGNOSIS — E0590 Thyrotoxicosis, unspecified without thyrotoxic crisis or storm: Secondary | ICD-10-CM | POA: Diagnosis not present

## 2014-06-22 DIAGNOSIS — L659 Nonscarring hair loss, unspecified: Secondary | ICD-10-CM | POA: Diagnosis not present

## 2014-07-14 DIAGNOSIS — M7122 Synovial cyst of popliteal space [Baker], left knee: Secondary | ICD-10-CM | POA: Diagnosis not present

## 2014-07-28 ENCOUNTER — Other Ambulatory Visit (HOSPITAL_COMMUNITY): Payer: Self-pay | Admitting: Family Medicine

## 2014-07-28 ENCOUNTER — Other Ambulatory Visit: Payer: Self-pay | Admitting: Cardiovascular Disease

## 2014-07-28 ENCOUNTER — Ambulatory Visit (HOSPITAL_COMMUNITY)
Admission: RE | Admit: 2014-07-28 | Discharge: 2014-07-28 | Disposition: A | Discharge status: Home / Self Care | Payer: Medicare Other | Source: Ambulatory Visit | Attending: Family Medicine | Admitting: Family Medicine

## 2014-07-28 DIAGNOSIS — M79662 Pain in left lower leg: Secondary | ICD-10-CM | POA: Diagnosis not present

## 2014-07-28 DIAGNOSIS — E0590 Thyrotoxicosis, unspecified without thyrotoxic crisis or storm: Secondary | ICD-10-CM | POA: Diagnosis not present

## 2014-07-28 DIAGNOSIS — M79605 Pain in left leg: Secondary | ICD-10-CM | POA: Diagnosis not present

## 2014-07-28 DIAGNOSIS — M7989 Other specified soft tissue disorders: Secondary | ICD-10-CM | POA: Diagnosis not present

## 2014-07-28 NOTE — Progress Notes (Signed)
Left Lower Extremity Venous Duplex Completed. No evidence for DVT or SVT, evidence for possible Baker's cyst measuring 1.1 x 1.1 centimeters. [place]

## 2014-07-30 DIAGNOSIS — M1712 Unilateral primary osteoarthritis, left knee: Secondary | ICD-10-CM | POA: Diagnosis not present

## 2014-08-03 ENCOUNTER — Telehealth (HOSPITAL_COMMUNITY): Payer: Self-pay | Admitting: *Deleted

## 2014-08-03 DIAGNOSIS — M1712 Unilateral primary osteoarthritis, left knee: Secondary | ICD-10-CM | POA: Diagnosis not present

## 2014-08-11 ENCOUNTER — Telehealth: Payer: Self-pay | Admitting: Cardiovascular Disease

## 2014-08-11 NOTE — Telephone Encounter (Signed)
New Message   Pt wanted to speak w/ Rn about new medication (garcinia- [place]) and the possibility of pausing the BP medicine to take it. Please call back and discuss

## 2014-08-11 NOTE — Telephone Encounter (Signed)
I am not famalier  With that . I do not have an opioin about  It.

## 2014-08-11 NOTE — Telephone Encounter (Signed)
Spoke with patient who is wanting to start taking Garcinia Cambogia. She is interested in it as a "diet aid". She knows she needs to check with her cardiologist if she adds supplements or other products along with her medications. She wanted to know if Dr. Acie Fredrickson had any objection to her trying it and if so, should she pause her BP medications to try it?  Routed to Dr. Acie Fredrickson

## 2014-08-12 ENCOUNTER — Inpatient Hospital Stay: Admission: RE | Admit: 2014-08-12 | Payer: Medicare Other | Source: Ambulatory Visit

## 2014-08-13 NOTE — Telephone Encounter (Signed)
Spoke with patient and reviewed Dr. Elmarie Shiley advice.  I advised her to read package instructions/warnings and to stop medication if she has adverse side effects.  Patient verbalized understanding and agreement.

## 2014-08-16 ENCOUNTER — Telehealth: Payer: Self-pay | Admitting: Cardiovascular Disease

## 2014-08-16 DIAGNOSIS — I10 Essential (primary) hypertension: Secondary | ICD-10-CM

## 2014-08-16 NOTE — Telephone Encounter (Signed)
New message        Pt states she needs a prescription for a diuretic

## 2014-08-16 NOTE — Telephone Encounter (Signed)
Spoke with patient who states she used to take HCTZ 25 mg once daily and at some point missed her refills and was advised to remain off as long as she was doing well.  Patient states over the past week or so she has started to develop lower extremity swelling and she would like to restart the HCTZ but does not have a Rx.  Patient denies SOB, abdominal distention or any other complaints.  I advised patient that I will send to Dr. Acie Fredrickson for review and will call her back with his advice.  Patient verbalized understanding and agreement.

## 2014-08-17 ENCOUNTER — Encounter: Payer: Self-pay | Admitting: Nurse Practitioner

## 2014-08-17 MED ORDER — POTASSIUM CHLORIDE CRYS ER 20 MEQ PO TBCR: 20.0000 meq | EXTENDED_RELEASE_TABLET | Freq: Every day | ORAL | Status: DC

## 2014-08-17 MED ORDER — HYDROCHLOROTHIAZIDE 25 MG PO TABS: 25.0000 mg | ORAL_TABLET | Freq: Every day | ORAL | Status: DC

## 2014-08-17 NOTE — Telephone Encounter (Signed)
MyChart message sent to patient informing her of lab appointment on 4/20 for f/u BMET.

## 2014-08-17 NOTE — Telephone Encounter (Signed)
Her BP has been slightly elevated.  She now reports some ankle edema. I agree with restarting HCTZ 25 mg a day and Kdur 20 a day. Recheck BMP in 3 weeks.

## 2014-08-17 NOTE — Telephone Encounter (Signed)
Left message for patient to call office regarding lab appointment needed and that I am sending Rx for HCTZ 25 mg and Kdur 20 meq.

## 2014-08-24 DIAGNOSIS — E0590 Thyrotoxicosis, unspecified without thyrotoxic crisis or storm: Secondary | ICD-10-CM | POA: Diagnosis not present

## 2014-09-01 ENCOUNTER — Ambulatory Visit
Admission: RE | Admit: 2014-09-01 | Discharge: 2014-09-01 | Disposition: A | Discharge status: Home / Self Care | Payer: Medicare Other | Source: Ambulatory Visit | Attending: Surgery | Admitting: Surgery

## 2014-09-01 DIAGNOSIS — E042 Nontoxic multinodular goiter: Secondary | ICD-10-CM | POA: Diagnosis not present

## 2014-09-05 ENCOUNTER — Encounter (HOSPITAL_COMMUNITY): Payer: Self-pay | Admitting: *Deleted

## 2014-09-05 ENCOUNTER — Emergency Department (HOSPITAL_COMMUNITY)
Admission: EM | Admit: 2014-09-05 | Discharge: 2014-09-06 | Disposition: A | Discharge status: Home / Self Care | Payer: Medicare Other | Attending: Emergency Medicine | Admitting: Emergency Medicine

## 2014-09-05 DIAGNOSIS — R531 Weakness: Secondary | ICD-10-CM | POA: Diagnosis present

## 2014-09-05 DIAGNOSIS — I10 Essential (primary) hypertension: Secondary | ICD-10-CM | POA: Insufficient documentation

## 2014-09-05 DIAGNOSIS — Z87891 Personal history of nicotine dependence: Secondary | ICD-10-CM | POA: Diagnosis not present

## 2014-09-05 DIAGNOSIS — Z79899 Other long term (current) drug therapy: Secondary | ICD-10-CM | POA: Diagnosis not present

## 2014-09-05 DIAGNOSIS — Z8742 Personal history of other diseases of the female genital tract: Secondary | ICD-10-CM | POA: Insufficient documentation

## 2014-09-05 DIAGNOSIS — Z7902 Long term (current) use of antithrombotics/antiplatelets: Secondary | ICD-10-CM | POA: Insufficient documentation

## 2014-09-05 DIAGNOSIS — R5382 Chronic fatigue, unspecified: Secondary | ICD-10-CM | POA: Insufficient documentation

## 2014-09-05 DIAGNOSIS — F419 Anxiety disorder, unspecified: Secondary | ICD-10-CM | POA: Insufficient documentation

## 2014-09-05 DIAGNOSIS — Z7982 Long term (current) use of aspirin: Secondary | ICD-10-CM | POA: Insufficient documentation

## 2014-09-05 DIAGNOSIS — E785 Hyperlipidemia, unspecified: Secondary | ICD-10-CM | POA: Diagnosis not present

## 2014-09-05 DIAGNOSIS — K219 Gastro-esophageal reflux disease without esophagitis: Secondary | ICD-10-CM | POA: Insufficient documentation

## 2014-09-05 DIAGNOSIS — Z8639 Personal history of other endocrine, nutritional and metabolic disease: Secondary | ICD-10-CM

## 2014-09-05 DIAGNOSIS — E669 Obesity, unspecified: Secondary | ICD-10-CM | POA: Diagnosis not present

## 2014-09-05 DIAGNOSIS — H409 Unspecified glaucoma: Secondary | ICD-10-CM | POA: Insufficient documentation

## 2014-09-05 DIAGNOSIS — I2510 Atherosclerotic heart disease of native coronary artery without angina pectoris: Secondary | ICD-10-CM | POA: Diagnosis not present

## 2014-09-05 LAB — URINALYSIS, ROUTINE W REFLEX MICROSCOPIC
Bilirubin Urine: NEGATIVE
Glucose, UA: NEGATIVE mg/dL
Hgb urine dipstick: NEGATIVE
Ketones, ur: NEGATIVE mg/dL
Nitrite: NEGATIVE
Protein, ur: NEGATIVE mg/dL
Specific Gravity, Urine: 1.015 (ref 1.005–1.030)
Urobilinogen, UA: 0.2 mg/dL (ref 0.0–1.0)
pH: 5.5 (ref 5.0–8.0)

## 2014-09-05 LAB — CBC WITH DIFFERENTIAL/PLATELET
Basophils Absolute: 0 10*3/uL (ref 0.0–0.1)
Basophils Relative: 1 % (ref 0–1)
Eosinophils Absolute: 0.2 10*3/uL (ref 0.0–0.7)
Eosinophils Relative: 4 % (ref 0–5)
HCT: 33.8 % — ABNORMAL LOW (ref 36.0–46.0)
Hemoglobin: 10.9 g/dL — ABNORMAL LOW (ref 12.0–15.0)
Lymphocytes Relative: 31 % (ref 12–46)
Lymphs Abs: 1.7 10*3/uL (ref 0.7–4.0)
MCH: 28.2 pg (ref 26.0–34.0)
MCHC: 32.2 g/dL (ref 30.0–36.0)
MCV: 87.6 fL (ref 78.0–100.0)
Monocytes Absolute: 0.5 10*3/uL (ref 0.1–1.0)
Monocytes Relative: 9 % (ref 3–12)
Neutro Abs: 3 10*3/uL (ref 1.7–7.7)
Neutrophils Relative %: 55 % (ref 43–77)
Platelets: 199 10*3/uL (ref 150–400)
RBC: 3.86 MIL/uL — ABNORMAL LOW (ref 3.87–5.11)
RDW: 15 % (ref 11.5–15.5)
WBC: 5.5 10*3/uL (ref 4.0–10.5)

## 2014-09-05 LAB — COMPREHENSIVE METABOLIC PANEL
ALT: 22 U/L (ref 0–35)
AST: 27 U/L (ref 0–37)
Albumin: 3.4 g/dL — ABNORMAL LOW (ref 3.5–5.2)
Alkaline Phosphatase: 97 U/L (ref 39–117)
Anion gap: 11 (ref 5–15)
BUN: 21 mg/dL (ref 6–23)
CO2: 24 mmol/L (ref 19–32)
Calcium: 9.5 mg/dL (ref 8.4–10.5)
Chloride: 101 mmol/L (ref 96–112)
Creatinine, Ser: 1.48 mg/dL — ABNORMAL HIGH (ref 0.50–1.10)
GFR calc Af Amer: 40 mL/min — ABNORMAL LOW (ref >90)
GFR calc non Af Amer: 34 mL/min — ABNORMAL LOW (ref >90)
Glucose, Bld: 109 mg/dL — ABNORMAL HIGH (ref 70–99)
Potassium: 4.5 mmol/L (ref 3.5–5.1)
Sodium: 136 mmol/L (ref 135–145)
Total Bilirubin: 0.7 mg/dL (ref 0.3–1.2)
Total Protein: 7.3 g/dL (ref 6.0–8.3)

## 2014-09-05 LAB — TSH: TSH: 1.546 u[IU]/mL (ref 0.350–4.500)

## 2014-09-05 LAB — URINE MICROSCOPIC-ADD ON

## 2014-09-05 LAB — TROPONIN I: Troponin I: <0.03 ng/mL (ref <0.031)

## 2014-09-05 LAB — LIPASE, BLOOD: Lipase: 43 U/L (ref 11–59)

## 2014-09-05 NOTE — Discharge Instructions (Signed)
Chronic Fatigue Syndrome Chronic fatigue syndrome (CFS) is a condition in which there is lasting, extreme tiredness (fatigue) that does not improve with rest. CFS affects women up to four times more often than men. If you have CFS, fatigue and other symptoms can make it hard for you to get through your day. There is no treatment or cure. You will need to work closely with your health care provider to come up with a treatment plan that works for you. CAUSES  No one knows what causes CFS. It may be triggered by a flu-like illness or by mono. Other triggers may include:  An abnormal immune system.  Low blood pressure.  Poor diet.  Physical or emotional stress. SIGNS AND SYMPTOMS The main symptom is fatigue that lasts all day, especially after physical or mental stress. Other common symptoms include:  An extreme loss of energy with no obvious cause.  Muscle or joint soreness.  Severe weakness.  Frequent headaches.  Fever.  Sore throat.  Swollen lymph glands.  Sleep is not refreshing.  Loss of concentration or memory. Less common symptoms may include:  Chills.  Night sweats.  Tingling or numbness.  Blurred vision.  Dizziness.  Sensitivity to noise or odors.  Mood swings.  Anxiety, panic attacks, and depression. Your symptoms may come and go, or you may have them all the time. DIAGNOSIS  There are no tests that can help health care providers diagnose CFS. It may take a long time for you to get a correct diagnosis. Your health care provider may need to do a number of tests to rule out other conditions that could be causing your symptoms. You may be diagnosed with CFS if:  You have fatigue that has lasted for at least six months.  Your fatigue is not relieved by rest.  Your fatigue is not caused by another condition.  Your fatigue is severe enough to interfere with work and daily activities.  You have at least four common symptoms of CFS. TREATMENT  There is no  cure for CFS at this time. The condition affects everyone differently. You will need to work with your health care provider to find the best treatment for your symptoms. Treatment may include:  Improving sleep with a regular bedtime routine.  Avoiding caffeine, alcohol, and tobacco.  Doing light exercise and stretching during the day.  Taking medicine to help you sleep.  Taking over-the-counter medicines to relieve joint or muscle pain.  Learning and practicing relaxation techniques.  Using memory aids or doing brain teasers to improve memory and concentration.  Seeing a mental health professional to evaluate and treat depression, if necessary.  Trying massage therapy, acupuncture, and movement exercises, like yoga or tai chi. HOME CARE INSTRUCTIONS Work closely with your health care provider to follow your treatment plan at home. You may need to make major lifestyle changes. If treatment does not seem to help, get a second opinion. You may get help from many health care providers, including doctors, mental health specialists, physical therapists, and rehabilitation therapists. Having the support of friends and loved ones is also important. SEEK MEDICAL CARE IF:  Your symptoms are not responding to treatment.  You are having strong feelings of anger, guilt, anxiety, or depression. Document Released: 06/14/2004 Document Revised: 09/21/2013 Document Reviewed: 03/27/2013 Exit[employer] Patient Information 2015 [employer]. This information is not intended to replace advice given to you by your health care provider. Make sure you discuss any questions you have with your health care provider.  

## 2014-09-05 NOTE — ED Notes (Signed)
The pt has had weakness for  3 days with no energy she has had some chest discomfort.  Earlier today her bp was in the 25s.  Alert noi distress.  She is being workwd up for  Thyroid problems

## 2014-09-05 NOTE — ED Provider Notes (Signed)
CSN: [id number]     Arrival date & time 09/05/14  2047 History   First MD Initiated Contact with Patient 09/05/14 2200     Chief Complaint  Patient presents with  . Weakness   (Consider location/radiation/quality/duration/timing/severity/associated sxs/prior Treatment) Patient is a 74 y.o. female presenting with general illness. The history is provided by the patient. No language interpreter was used.  Illness Location:  Generalized fatigue Severity:  Mild Onset quality:  Gradual Timing:  Constant Progression:  Unchanged Chronicity:  Chronic Context:  At rest Relieved by:  Nothing tried Worsened by:  Nothing tried  Ineffective treatments:  Nothing tried Associated symptoms: fatigue   Associated symptoms: no abdominal pain, no chest pain, no cough, no diarrhea, no fever, no headaches, no loss of consciousness, no nausea, no shortness of breath, no vomiting and no wheezing   Risk factors:  Hypothyroidism    Past Medical History  Diagnosis Date  . Hyperlipidemia   . Fatigue   . Anxiety   . Coronary artery disease     a. s/p MI 2009 - PCI/DES distal  RCA w/ 2.75 x 18 Xience DES;  b. 05/2010 Myoview: Apical thinning, EF 73%;  c. 06/2010 Cath: moderate nonobs dzs, EF 65%;  d.  Lexiscan Myoview (07/2013):  No scar or ischemia, EF 67%; Normal Study  . Fibromyalgia   . Essential iris atrophy     Right side  . Menopause   . Glaucoma   . Environmental allergies   . GERD (gastroesophageal reflux disease)   . Osteoarthritis   . OSA (obstructive sleep apnea)   . Obesity (BMI 30-39.9)   . Hypertension    Past Surgical History  Procedure Laterality Date  . Cardiac catheterization  06/29/2010    Mild to moderate coronary artery irregularities.  Her  proximal left anterior descending artery is moderately narrowed.  She  also has an eccentric stenosis in the proximal LAD.  These do not appear  to obstruct flow at present, but they are fairly small vessels.  They  appeared to be between 1.5 and  2 mm in diamete  . Cardiac catheterization  08/02/2009    Patent stent  . Cardiac catheterization  05/12/2008    Stent to the distal RCA   Family History  Problem Relation Age of Onset  . Heart attack Mother   . Coronary artery disease Brother     had CABG  . Coronary artery disease Brother   . Coronary artery disease Brother   . Coronary artery disease Brother   . Coronary artery disease Brother   . Heart attack Brother   . Cancer Father    History  Substance Use Topics  . Smoking status: Former Smoker    Types: Cigarettes    Quit date: 09/13/1960  . Smokeless tobacco: Never Used  . Alcohol Use: No   OB History    No data available     Review of Systems  Constitutional: Positive for fatigue. Negative for fever.  Respiratory: Negative for cough, chest tightness, shortness of breath and wheezing.   Cardiovascular: Negative for chest pain.  Gastrointestinal: Negative for nausea, vomiting, abdominal pain and diarrhea.  Neurological: Negative for loss of consciousness, weakness, light-headedness and headaches.  Psychiatric/Behavioral: Negative for confusion.  All other systems reviewed and are negative.     Allergies  Codeine; Cortisone; and Septra  Home Medications   Prior to Admission medications   Medication Sig Start Date End Date Taking? Authorizing Provider  amLODipine (NORVASC) 5 MG  tablet Take 1 tablet (5 mg total) by mouth daily. 10/20/13   Thayer Headings, MD  aspirin EC 81 MG tablet Take 81 mg by mouth every morning.    Historical Provider, MD  carvedilol (COREG) 12.5 MG tablet Take 1 tablet (12.5 mg total) by mouth 2 (two) times daily. 10/20/13 10/20/14  Thayer Headings, MD  clopidogrel (PLAVIX) 75 MG tablet TAKE 1 TABLET BY MOUTH EVERY DAY 07/28/14   Thayer Headings, MD  ferrous sulfate 325 (65 FE) MG tablet Take 325 mg by mouth every morning.     Historical Provider, MD  fluticasone (FLONASE) 50 MCG/ACT nasal spray Place 1 spray into the nose daily as needed for  rhinitis or allergies.     Historical Provider, MD  hydrochlorothiazide (HYDRODIURIL) 25 MG tablet Take 1 tablet (25 mg total) by mouth daily. 08/17/14   Thayer Headings, MD  HYDROcodone-acetaminophen (NORCO/VICODIN) 5-325 MG per tablet  01/04/14   Historical Provider, MD  lisinopril (PRINIVIL,ZESTRIL) 40 MG tablet Take 20 mg by mouth every morning. 10/20/13   Thayer Headings, MD  meclizine (ANTIVERT) 50 MG tablet Take 1 tablet (50 mg total) by mouth 3 (three) times daily as needed. 11/17/13   Tanna Furry, MD  methimazole (TAPAZOLE) 5 MG tablet Take 5 mg by mouth every morning.     Historical Provider, MD  nitrofurantoin (MACRODANTIN) 100 MG capsule  12/22/13   Historical Provider, MD  nitroGLYCERIN (NITROSTAT) 0.4 MG SL tablet Place 0.4 mg under the tongue every 5 (five) minutes x 3 doses as needed for chest pain. For chest pain    Historical Provider, MD  ondansetron (ZOFRAN ODT) 4 MG disintegrating tablet Take 1 tablet (4 mg total) by mouth every 8 (eight) hours as needed for nausea. 11/17/13   Tanna Furry, MD  pantoprazole (PROTONIX) 40 MG tablet  01/07/14   Historical Provider, MD  potassium chloride SA (K-DUR,KLOR-CON) 20 MEQ tablet Take 1 tablet (20 mEq total) by mouth daily. 08/17/14   Thayer Headings, MD  pramipexole (MIRAPEX) 1 MG tablet Take 2 mg by mouth at bedtime.     Historical Provider, MD  rosuvastatin (CRESTOR) 20 MG tablet Take 1 tablet (20 mg total) by mouth every morning. 10/21/13   Thayer Headings, MD  traMADol (ULTRAM) 50 MG tablet Take 50 mg by mouth 3 (three) times daily as needed for pain. For pain    Historical Provider, MD  venlafaxine (EFFEXOR) 75 MG tablet Take 32.5 mg by mouth 2 (two) times daily.     Historical Provider, MD  VOLTAREN 1 % GEL As needed 04/02/13   Historical Provider, MD   BP 132/44 mmHg  Pulse 57  Temp(Src) 97.9 F (36.6 C) (Oral)  Resp 12  Ht 5\' 6"  (1.676 m)  Wt 219 lb 9 oz (99.593 kg)  BMI 35.46 kg/m2  SpO2 97% Physical Exam  Constitutional: She is  oriented to person, place, and time. Vital signs are normal. She appears well-developed and well-nourished. She is cooperative. She does not appear ill. No distress.  HENT:  Head: Normocephalic and atraumatic.  Nose: Nose normal.  Mouth/Throat: Oropharynx is clear and moist. No oropharyngeal exudate.  Eyes: EOM are normal. Pupils are equal, round, and reactive to light.  Neck: Normal range of motion. Neck supple.  No neck masses/nodules  Cardiovascular: Normal rate, regular rhythm, normal heart sounds and intact distal pulses.   No murmur heard. Pulmonary/Chest: Effort normal and breath sounds normal. No respiratory distress. She has no  wheezes. She exhibits no tenderness.  Abdominal: Soft. There is no tenderness. There is no rebound and no guarding.  Musculoskeletal: Normal range of motion. She exhibits no tenderness.  Lymphadenopathy:    She has no cervical adenopathy.  Neurological: She is alert and oriented to person, place, and time. No cranial nerve deficit. Coordination normal.  Skin: Skin is warm and dry. She is not diaphoretic.  Psychiatric: She has a normal mood and affect. Her behavior is normal. Judgment and thought content normal.  Nursing note and vitals reviewed.   ED Course  Procedures (including critical care time) Labs Review Labs Reviewed  CBC WITH DIFFERENTIAL/PLATELET - Abnormal; Notable for the following:    RBC 3.86 (*)    Hemoglobin 10.9 (*)    HCT 33.8 (*)    All other components within normal limits  COMPREHENSIVE METABOLIC PANEL - Abnormal; Notable for the following:    Glucose, Bld 109 (*)    Creatinine, Ser 1.48 (*)    Albumin 3.4 (*)    GFR calc non Af Amer 34 (*)    GFR calc Af Amer 40 (*)    All other components within normal limits  URINALYSIS, ROUTINE W REFLEX MICROSCOPIC - Abnormal; Notable for the following:    APPearance CLOUDY (*)    Leukocytes, UA LARGE (*)    All other components within normal limits  URINE MICROSCOPIC-ADD ON -  Abnormal; Notable for the following:    Casts HYALINE CASTS (*)    All other components within normal limits  LIPASE, BLOOD  TROPONIN I  TSH    Imaging Review No results found.   EKG Interpretation None     ED ECG REPORT   Date: 09/05/2014  Rate: 65  Rhythm: normal sinus rhythm  QRS Axis: normal  Intervals: normal  ST/T Wave abnormalities: normal  Conduction Disutrbances:none  Narrative Interpretation: benign NSR  Old EKG Reviewed: none available  I have personally reviewed the EKG tracing and agree with the computerized printout as noted.  MDM   Final diagnoses:  Chronic fatigue  History of hypothyroidism   Pt is a 74 yo F with hx of CAD, anxiety, fibromyalgia, and HTN who presents with generalized fatigue multiple years.  Reportedly has been told she has hyperthyroid numbers but sx of hypothyroidism.  Has taken radioactive iodine and now is on synthroid.  Complains of generalized chronic fatigue but no constipation, no dry skin, no problems mentating. No change today than it has been for the past several months/years.  Then today, she was at a SNF visiting her sister when she took her BP and was hypotensive down to 60 systolic, then repeat was 89.  Wrist automatic BP cuff.   She denies chest pain or SOB.  No hx of arhythmia.  No hx of anemia and no sx of bleeding.  No recent infection or medication change.  No hx of drug use or EtOH.  No reason for dehydration. No hx of neuro sx including no focal weakness, no paresthesias, no confusion, no speech deficits.    Normotensive here.  Slightly bradycardic but on BB.  Normal neuro exam.  Benign exam overall.   Will work up with labs including TSH and urine.     Labs returned benign.  No signs of infection, normal TSH.     Patient was seen with ED Attending, Dr. Joette Catching, MD   Tori Milks, MD 09/06/14 [id number]  Virgel Manifold, MD 09/08/14 [phone]

## 2014-09-07 DIAGNOSIS — M5136 Other intervertebral disc degeneration, lumbar region: Secondary | ICD-10-CM | POA: Diagnosis not present

## 2014-09-07 DIAGNOSIS — M4806 Spinal stenosis, lumbar region: Secondary | ICD-10-CM | POA: Diagnosis not present

## 2014-09-07 DIAGNOSIS — M4726 Other spondylosis with radiculopathy, lumbar region: Secondary | ICD-10-CM | POA: Diagnosis not present

## 2014-09-07 DIAGNOSIS — M5417 Radiculopathy, lumbosacral region: Secondary | ICD-10-CM | POA: Diagnosis not present

## 2014-09-08 ENCOUNTER — Other Ambulatory Visit: Payer: Medicare Other

## 2014-09-30 DIAGNOSIS — E042 Nontoxic multinodular goiter: Secondary | ICD-10-CM | POA: Diagnosis not present

## 2014-10-27 ENCOUNTER — Encounter: Payer: Medicare Other | Admitting: Cardiovascular Disease

## 2014-10-27 NOTE — Progress Notes (Signed)
This encounter was created in error - please disregard.

## 2014-10-28 ENCOUNTER — Other Ambulatory Visit: Payer: Medicare Other

## 2014-11-01 DIAGNOSIS — R351 Nocturia: Secondary | ICD-10-CM | POA: Diagnosis not present

## 2014-11-01 DIAGNOSIS — N3020 Other chronic cystitis without hematuria: Secondary | ICD-10-CM | POA: Diagnosis not present

## 2014-11-12 DIAGNOSIS — H21261 Iris atrophy (essential) (progressive), right eye: Secondary | ICD-10-CM | POA: Diagnosis not present

## 2014-11-12 DIAGNOSIS — H25813 Combined forms of age-related cataract, bilateral: Secondary | ICD-10-CM | POA: Diagnosis not present

## 2014-11-12 DIAGNOSIS — H21541 Posterior synechiae (iris), right eye: Secondary | ICD-10-CM | POA: Diagnosis not present

## 2014-11-12 DIAGNOSIS — D3132 Benign neoplasm of left choroid: Secondary | ICD-10-CM | POA: Diagnosis not present

## 2014-11-15 ENCOUNTER — Other Ambulatory Visit: Payer: Self-pay

## 2014-11-19 ENCOUNTER — Other Ambulatory Visit: Payer: Self-pay | Admitting: Cardiovascular Disease

## 2014-11-27 ENCOUNTER — Emergency Department (HOSPITAL_COMMUNITY)
Admission: EM | Admit: 2014-11-27 | Discharge: 2014-11-27 | Disposition: A | Discharge status: Home / Self Care | Payer: Medicare Other | Attending: Emergency Medicine | Admitting: Emergency Medicine

## 2014-11-27 ENCOUNTER — Encounter (HOSPITAL_COMMUNITY): Payer: Self-pay | Admitting: Emergency Medicine

## 2014-11-27 DIAGNOSIS — M797 Fibromyalgia: Secondary | ICD-10-CM | POA: Insufficient documentation

## 2014-11-27 DIAGNOSIS — Y9289 Other specified places as the place of occurrence of the external cause: Secondary | ICD-10-CM | POA: Diagnosis not present

## 2014-11-27 DIAGNOSIS — M1990 Unspecified osteoarthritis, unspecified site: Secondary | ICD-10-CM | POA: Insufficient documentation

## 2014-11-27 DIAGNOSIS — E785 Hyperlipidemia, unspecified: Secondary | ICD-10-CM | POA: Insufficient documentation

## 2014-11-27 DIAGNOSIS — Z7902 Long term (current) use of antithrombotics/antiplatelets: Secondary | ICD-10-CM | POA: Diagnosis not present

## 2014-11-27 DIAGNOSIS — I2510 Atherosclerotic heart disease of native coronary artery without angina pectoris: Secondary | ICD-10-CM | POA: Diagnosis not present

## 2014-11-27 DIAGNOSIS — I10 Essential (primary) hypertension: Secondary | ICD-10-CM | POA: Diagnosis not present

## 2014-11-27 DIAGNOSIS — E780 Pure hypercholesterolemia: Secondary | ICD-10-CM | POA: Insufficient documentation

## 2014-11-27 DIAGNOSIS — Z8669 Personal history of other diseases of the nervous system and sense organs: Secondary | ICD-10-CM | POA: Diagnosis not present

## 2014-11-27 DIAGNOSIS — Y998 Other external cause status: Secondary | ICD-10-CM | POA: Diagnosis not present

## 2014-11-27 DIAGNOSIS — K219 Gastro-esophageal reflux disease without esophagitis: Secondary | ICD-10-CM | POA: Diagnosis not present

## 2014-11-27 DIAGNOSIS — Z87891 Personal history of nicotine dependence: Secondary | ICD-10-CM | POA: Diagnosis not present

## 2014-11-27 DIAGNOSIS — F419 Anxiety disorder, unspecified: Secondary | ICD-10-CM | POA: Diagnosis not present

## 2014-11-27 DIAGNOSIS — Z7982 Long term (current) use of aspirin: Secondary | ICD-10-CM | POA: Diagnosis not present

## 2014-11-27 DIAGNOSIS — W57XXXA Bitten or stung by nonvenomous insect and other nonvenomous arthropods, initial encounter: Secondary | ICD-10-CM | POA: Insufficient documentation

## 2014-11-27 DIAGNOSIS — S80862A Insect bite (nonvenomous), left lower leg, initial encounter: Secondary | ICD-10-CM | POA: Diagnosis not present

## 2014-11-27 DIAGNOSIS — E669 Obesity, unspecified: Secondary | ICD-10-CM | POA: Diagnosis not present

## 2014-11-27 DIAGNOSIS — Y9389 Activity, other specified: Secondary | ICD-10-CM | POA: Diagnosis not present

## 2014-11-27 DIAGNOSIS — Z9889 Other specified postprocedural states: Secondary | ICD-10-CM | POA: Diagnosis not present

## 2014-11-27 DIAGNOSIS — Z79899 Other long term (current) drug therapy: Secondary | ICD-10-CM | POA: Diagnosis not present

## 2014-11-27 NOTE — ED Notes (Addendum)
Pt arrives c/o insect bite to inner left calf, warm and red to the touch, blanchable. 1+ swelling to bilateral extremities. Itching and painful.

## 2014-11-27 NOTE — ED Provider Notes (Addendum)
CSN: [id number]     Arrival date & time 11/27/14  0559 History   First MD Initiated Contact with Patient 11/27/14 [phone]     Chief Complaint  Patient presents with  . Insect Bite     (Consider location/radiation/quality/duration/timing/severity/associated sxs/prior Treatment) HPI  74 year old female with pain, redness, and itching in her left lower extremity since yesterday morning. She thinks she fell sustaining or bite but did not see the actual bug or insect. Since then has had an irregular area of swelling with a small blister in the middle. Has some pain but is mostly itchy. There is no drainage. Patient is not any fevers, shortness of breath, or trouble speaking or swallowing. Has not taken anything for these symptoms. No rapid progression of symptoms.  Past Medical History  Diagnosis Date  . Hyperlipidemia   . Fatigue   . Anxiety   . Coronary artery disease     a. s/p MI 2009 - PCI/DES distal  RCA w/ 2.75 x 18 Xience DES;  b. 05/2010 Myoview: Apical thinning, EF 73%;  c. 06/2010 Cath: moderate nonobs dzs, EF 65%;  d.  Lexiscan Myoview (07/2013):  No scar or ischemia, EF 67%; Normal Study  . Fibromyalgia   . Essential iris atrophy     Right side  . Menopause   . Glaucoma   . Environmental allergies   . GERD (gastroesophageal reflux disease)   . Osteoarthritis   . OSA (obstructive sleep apnea)   . Obesity (BMI 30-39.9)   . Hypertension    Past Surgical History  Procedure Laterality Date  . Cardiac catheterization  06/29/2010    Mild to moderate coronary artery irregularities.  Her  proximal left anterior descending artery is moderately narrowed.  She  also has an eccentric stenosis in the proximal LAD.  These do not appear  to obstruct flow at present, but they are fairly small vessels.  They  appeared to be between 1.5 and 2 mm in diamete  . Cardiac catheterization  08/02/2009    Patent stent  . Cardiac catheterization  05/12/2008    Stent to the distal RCA   Family History    Problem Relation Age of Onset  . Heart attack Mother   . Coronary artery disease Brother     had CABG  . Coronary artery disease Brother   . Coronary artery disease Brother   . Coronary artery disease Brother   . Coronary artery disease Brother   . Heart attack Brother   . Cancer Father    History  Substance Use Topics  . Smoking status: Former Smoker    Types: Cigarettes    Quit date: 09/13/1960  . Smokeless tobacco: Never Used  . Alcohol Use: No   OB History    No data available     Review of Systems  Constitutional: Negative for fever.  Cardiovascular: Positive for leg swelling (chronic).  Skin: Positive for color change.  All other systems reviewed and are negative.     Allergies  Codeine; Cortisone; and Septra  Home Medications   Prior to Admission medications   Medication Sig Start Date End Date Taking? Authorizing Provider  amLODipine (NORVASC) 5 MG tablet Take 1 tablet (5 mg total) by mouth daily. 10/20/13   Thayer Headings, MD  aspirin EC 81 MG tablet Take 81 mg by mouth every morning.    Historical Provider, MD  carvedilol (COREG) 12.5 MG tablet Take 1 tablet (12.5 mg total) by mouth 2 (two) times daily.  10/20/13 10/20/14  Thayer Headings, MD  clopidogrel (PLAVIX) 75 MG tablet TAKE 1 TABLET BY MOUTH EVERY DAY 07/28/14   Thayer Headings, MD  ferrous sulfate 325 (65 FE) MG tablet Take 325 mg by mouth every morning.     Historical Provider, MD  fluticasone (FLONASE) 50 MCG/ACT nasal spray Place 1 spray into the nose daily as needed for rhinitis or allergies.     Historical Provider, MD  hydrochlorothiazide (HYDRODIURIL) 25 MG tablet Take 1 tablet (25 mg total) by mouth daily. 08/17/14   Thayer Headings, MD  HYDROcodone-acetaminophen (NORCO/VICODIN) 5-325 MG per tablet  01/04/14   Historical Provider, MD  lisinopril (PRINIVIL,ZESTRIL) 40 MG tablet Take 20 mg by mouth every morning. 10/20/13   Thayer Headings, MD  lisinopril (PRINIVIL,ZESTRIL) 40 MG tablet TAKE 1 TABLET BY  MOUTH EVERY MORNING 11/19/14   Thayer Headings, MD  meclizine (ANTIVERT) 50 MG tablet Take 1 tablet (50 mg total) by mouth 3 (three) times daily as needed. 11/17/13   Tanna Furry, MD  methimazole (TAPAZOLE) 5 MG tablet Take 5 mg by mouth every morning.     Historical Provider, MD  nitrofurantoin (MACRODANTIN) 100 MG capsule  12/22/13   Historical Provider, MD  nitroGLYCERIN (NITROSTAT) 0.4 MG SL tablet Place 0.4 mg under the tongue every 5 (five) minutes x 3 doses as needed for chest pain. For chest pain    Historical Provider, MD  ondansetron (ZOFRAN ODT) 4 MG disintegrating tablet Take 1 tablet (4 mg total) by mouth every 8 (eight) hours as needed for nausea. 11/17/13   Tanna Furry, MD  pantoprazole (PROTONIX) 40 MG tablet  01/07/14   Historical Provider, MD  potassium chloride SA (K-DUR,KLOR-CON) 20 MEQ tablet Take 1 tablet (20 mEq total) by mouth daily. 08/17/14   Thayer Headings, MD  pramipexole (MIRAPEX) 1 MG tablet Take 2 mg by mouth at bedtime.     Historical Provider, MD  rosuvastatin (CRESTOR) 20 MG tablet Take 1 tablet (20 mg total) by mouth every morning. 10/21/13   Thayer Headings, MD  traMADol (ULTRAM) 50 MG tablet Take 50 mg by mouth 3 (three) times daily as needed for pain. For pain    Historical Provider, MD  venlafaxine (EFFEXOR) 75 MG tablet Take 32.5 mg by mouth 2 (two) times daily.     Historical Provider, MD  VOLTAREN 1 % GEL As needed 04/02/13   Historical Provider, MD   BP 119/77 mmHg  Pulse 62  Temp(Src) 98.3 F (36.8 C) (Oral)  Resp 16  SpO2 99% Physical Exam  Constitutional: She is oriented to person, place, and time. She appears well-developed and well-nourished.  HENT:  Head: Normocephalic and atraumatic.  Right Ear: External ear normal.  Left Ear: External ear normal.  Nose: Nose normal.  Eyes: Right eye exhibits no discharge. Left eye exhibits no discharge.  Cardiovascular: Normal rate, regular rhythm and normal heart sounds.   Pulses:      Dorsalis pedis pulses are  2+ on the right side, and 2+ on the left side.  Pulmonary/Chest: Effort normal and breath sounds normal. No stridor. She has no wheezes.  Abdominal: Soft. She exhibits no distension. There is no tenderness.  Neurological: She is alert and oriented to person, place, and time.  Skin: Skin is warm and dry.     Nursing note and vitals reviewed.   ED Course  Procedures (including critical care time) Labs Review Labs Reviewed - No data to display  Imaging Review  No results found.   EKG Interpretation None      MDM   Final diagnoses:  Insect bite of left lower leg with local reaction, initial encounter    Patient symptoms are consistent with an exaggerated local reaction to a sting or bite. I highly doubt cellulitis, especially with her symptoms starting yesterday. Not c/w cellulitis. No systemic symptoms. Will recommend benadryl and local care. D/c home with return precautions.    Sherwood Gambler, MD 11/27/14 [id number]  Sherwood Gambler, MD 11/27/14 [phone]

## 2014-11-30 DIAGNOSIS — M7122 Synovial cyst of popliteal space [Baker], left knee: Secondary | ICD-10-CM | POA: Diagnosis not present

## 2014-11-30 DIAGNOSIS — M545 Low back pain: Secondary | ICD-10-CM | POA: Diagnosis not present

## 2014-11-30 DIAGNOSIS — Z79891 Long term (current) use of opiate analgesic: Secondary | ICD-10-CM | POA: Diagnosis not present

## 2014-11-30 DIAGNOSIS — G894 Chronic pain syndrome: Secondary | ICD-10-CM | POA: Diagnosis not present

## 2014-12-08 ENCOUNTER — Other Ambulatory Visit (INDEPENDENT_AMBULATORY_CARE_PROVIDER_SITE_OTHER): Payer: Medicare Other | Admitting: *Deleted

## 2014-12-08 DIAGNOSIS — I10 Essential (primary) hypertension: Secondary | ICD-10-CM

## 2014-12-08 LAB — BASIC METABOLIC PANEL
BUN: 25 mg/dL — ABNORMAL HIGH (ref 6–23)
CO2: 25 meq/L (ref 19–32)
Calcium: 9.4 mg/dL (ref 8.4–10.5)
Chloride: 107 mEq/L (ref 96–112)
Creatinine, Ser: 1.19 mg/dL (ref 0.40–1.20)
GFR: 47.31 mL/min — ABNORMAL LOW (ref >60.00)
Glucose, Bld: 97 mg/dL (ref 70–99)
POTASSIUM: 4.5 meq/L (ref 3.5–5.1)
Sodium: 138 mEq/L (ref 135–145)

## 2014-12-15 DIAGNOSIS — J209 Acute bronchitis, unspecified: Secondary | ICD-10-CM | POA: Diagnosis not present

## 2014-12-19 ENCOUNTER — Emergency Department (HOSPITAL_COMMUNITY): Payer: Medicare Other

## 2014-12-19 ENCOUNTER — Encounter (HOSPITAL_COMMUNITY): Payer: Self-pay | Admitting: Emergency Medicine

## 2014-12-19 ENCOUNTER — Observation Stay (HOSPITAL_COMMUNITY)
Admission: EM | Admit: 2014-12-19 | Discharge: 2014-12-20 | Disposition: A | Discharge status: Home / Self Care | Payer: Medicare Other | Attending: Cardiology | Admitting: Cardiology

## 2014-12-19 DIAGNOSIS — N189 Chronic kidney disease, unspecified: Secondary | ICD-10-CM | POA: Diagnosis not present

## 2014-12-19 DIAGNOSIS — Z955 Presence of coronary angioplasty implant and graft: Secondary | ICD-10-CM | POA: Diagnosis not present

## 2014-12-19 DIAGNOSIS — Z7902 Long term (current) use of antithrombotics/antiplatelets: Secondary | ICD-10-CM | POA: Insufficient documentation

## 2014-12-19 DIAGNOSIS — I10 Essential (primary) hypertension: Secondary | ICD-10-CM | POA: Diagnosis present

## 2014-12-19 DIAGNOSIS — E785 Hyperlipidemia, unspecified: Secondary | ICD-10-CM | POA: Insufficient documentation

## 2014-12-19 DIAGNOSIS — G4733 Obstructive sleep apnea (adult) (pediatric): Secondary | ICD-10-CM | POA: Insufficient documentation

## 2014-12-19 DIAGNOSIS — Z87891 Personal history of nicotine dependence: Secondary | ICD-10-CM | POA: Insufficient documentation

## 2014-12-19 DIAGNOSIS — Z7982 Long term (current) use of aspirin: Secondary | ICD-10-CM | POA: Insufficient documentation

## 2014-12-19 DIAGNOSIS — N179 Acute kidney failure, unspecified: Secondary | ICD-10-CM | POA: Insufficient documentation

## 2014-12-19 DIAGNOSIS — I2510 Atherosclerotic heart disease of native coronary artery without angina pectoris: Secondary | ICD-10-CM | POA: Insufficient documentation

## 2014-12-19 DIAGNOSIS — E0590 Thyrotoxicosis, unspecified without thyrotoxic crisis or storm: Secondary | ICD-10-CM | POA: Diagnosis not present

## 2014-12-19 DIAGNOSIS — R42 Dizziness and giddiness: Secondary | ICD-10-CM | POA: Diagnosis not present

## 2014-12-19 DIAGNOSIS — M797 Fibromyalgia: Secondary | ICD-10-CM | POA: Diagnosis not present

## 2014-12-19 DIAGNOSIS — I129 Hypertensive chronic kidney disease with stage 1 through stage 4 chronic kidney disease, or unspecified chronic kidney disease: Secondary | ICD-10-CM | POA: Insufficient documentation

## 2014-12-19 DIAGNOSIS — I4891 Unspecified atrial fibrillation: Secondary | ICD-10-CM | POA: Diagnosis not present

## 2014-12-19 DIAGNOSIS — I252 Old myocardial infarction: Secondary | ICD-10-CM | POA: Insufficient documentation

## 2014-12-19 DIAGNOSIS — R0789 Other chest pain: Secondary | ICD-10-CM | POA: Diagnosis not present

## 2014-12-19 DIAGNOSIS — I509 Heart failure, unspecified: Secondary | ICD-10-CM | POA: Diagnosis not present

## 2014-12-19 DIAGNOSIS — R079 Chest pain, unspecified: Secondary | ICD-10-CM | POA: Diagnosis not present

## 2014-12-19 LAB — BASIC METABOLIC PANEL
ANION GAP: 11 (ref 5–15)
BUN: 16 mg/dL (ref 6–20)
CALCIUM: 9.7 mg/dL (ref 8.9–10.3)
CO2: 22 mmol/L (ref 22–32)
Chloride: 105 mmol/L (ref 101–111)
Creatinine, Ser: 1.31 mg/dL — ABNORMAL HIGH (ref 0.44–1.00)
GFR, EST AFRICAN AMERICAN: 46 mL/min — AB (ref >60)
GFR, EST NON AFRICAN AMERICAN: 40 mL/min — AB (ref >60)
Glucose, Bld: 110 mg/dL — ABNORMAL HIGH (ref 65–99)
Potassium: 4.5 mmol/L (ref 3.5–5.1)
SODIUM: 138 mmol/L (ref 135–145)

## 2014-12-19 LAB — CBC
HEMATOCRIT: 32.6 % — AB (ref 36.0–46.0)
Hemoglobin: 10.5 g/dL — ABNORMAL LOW (ref 12.0–15.0)
MCH: 28.9 pg (ref 26.0–34.0)
MCHC: 32.2 g/dL (ref 30.0–36.0)
MCV: 89.8 fL (ref 78.0–100.0)
PLATELETS: 286 10*3/uL (ref 150–400)
RBC: 3.63 MIL/uL — ABNORMAL LOW (ref 3.87–5.11)
RDW: 13.9 % (ref 11.5–15.5)
WBC: 5.8 10*3/uL (ref 4.0–10.5)

## 2014-12-19 LAB — T4, FREE: FREE T4: 0.9 ng/dL (ref 0.61–1.12)

## 2014-12-19 LAB — TSH: TSH: 1.882 u[IU]/mL (ref 0.350–4.500)

## 2014-12-19 LAB — BRAIN NATRIURETIC PEPTIDE: B NATRIURETIC PEPTIDE 5: 221 pg/mL — AB (ref 0.0–100.0)

## 2014-12-19 LAB — I-STAT TROPONIN, ED: Troponin i, poc: 0.02 ng/mL (ref 0.00–0.08)

## 2014-12-19 MED ORDER — ASPIRIN 81 MG PO CHEW
324.0000 mg | CHEWABLE_TABLET | Freq: Once | ORAL | Status: AC
  Administered 2014-12-19: 324 mg via ORAL
  Filled 2014-12-19: qty 4

## 2014-12-19 NOTE — Consult Note (Signed)
[name] Medical Consultation  [name] MWU:[id number] DOB: 1940/09/02 DOA: 12/19/2014 PCP: Donnie Coffin, MD   Requesting physician: Dr. Doy Mince Date of consultation: 12/19/14 Reason for consultation: Chest pain  Impression/Recommendations Principal Problem:   Atrial fibrillation with RVR    1. A.Fib with RVR - new onset, but spontaneously resolved with conversion to NSR 1. Checking TSH and T4, patient used to be on methimazole but is no longer on this, there is question of wether she was previously hyperthyroid in the past. 2. CHADS-vasc score of 4 - anticoagulation candidate, should start Xarelto 3. Cardiology evaluating patient to see if there are any other tests or work up they would like to get on inpatient setting, if so will admit patient for obs, otherwise discharge home.    Chief Complaint: Chest pain  HPI:  74 yo F with h/o CAD who presents to the ED with onset of palpitations, burning central chest pain with radiation to both arms.  Symptoms onset 45 mins PTA.  Patient was noted to be in A.Fib RVR at rate of 134 on arrival to ED.  Total duration of symptoms was about 1.5 hours at which point patient converted to NSR spontaneously and her symptoms resolved.  Patient currently symptom free with NSR at 60.  Review of Systems:  12 systems reviewed and otherwise negative.  Past Medical History  Diagnosis Date  . Hyperlipidemia   . Fatigue   . Anxiety   . Coronary artery disease     a. s/p MI 2009 - PCI/DES distal  RCA w/ 2.75 x 18 Xience DES;  b. 05/2010 Myoview: Apical thinning, EF 73%;  c. 06/2010 Cath: moderate nonobs dzs, EF 65%;  d.  Lexiscan Myoview (07/2013):  No scar or ischemia, EF 67%; Normal Study  . Fibromyalgia   . Essential iris atrophy     Right side  . Menopause   . Glaucoma   . Environmental allergies   . GERD (gastroesophageal reflux disease)   . Osteoarthritis   . OSA (obstructive sleep apnea)   . Obesity (BMI 30-39.9)   .  Hypertension    Past Surgical History  Procedure Laterality Date  . Cardiac catheterization  06/29/2010    Mild to moderate coronary artery irregularities.  Her  proximal left anterior descending artery is moderately narrowed.  She  also has an eccentric stenosis in the proximal LAD.  These do not appear  to obstruct flow at present, but they are fairly small vessels.  They  appeared to be between 1.5 and 2 mm in diamete  . Cardiac catheterization  08/02/2009    Patent stent  . Cardiac catheterization  05/12/2008    Stent to the distal RCA   Social History:  reports that she quit smoking about 54 years ago. Her smoking use included Cigarettes. She has never used smokeless tobacco. She reports that she does not drink alcohol or use illicit drugs.  Allergies  Allergen Reactions  . Codeine Nausea And Vomiting and Other (See Comments)    Pt gets very hot and flushed  . Cortisone Nausea Only and Other (See Comments)    Pt gets very hot and flushed  . Septra [Sulfamethoxazole-Trimethoprim]   . Sulfamethoxazole-Trimethoprim    Family History  Problem Relation Age of Onset  . Heart attack Mother   . Coronary artery disease Brother     had CABG  . Coronary artery disease Brother   . Coronary artery disease Brother   . Coronary artery disease Brother   .  Coronary artery disease Brother   . Heart attack Brother   . Cancer Father     Prior to Admission medications   Medication Sig Start Date End Date Taking? Authorizing Provider  amLODipine (NORVASC) 5 MG tablet Take 1 tablet (5 mg total) by mouth daily. 10/20/13  Yes Thayer Headings, MD  aspirin EC 81 MG tablet Take 81 mg by mouth every morning.   Yes Historical Provider, MD  azithromycin (ZITHROMAX) 250 MG tablet Take 250-500 mg by mouth daily. 12/15/14  Yes Historical Provider, MD  carvedilol (COREG) 12.5 MG tablet Take 12.5 mg by mouth 2 (two) times daily with a meal.  10/20/13  Yes Historical Provider, MD  clopidogrel (PLAVIX) 75 MG tablet  TAKE 1 TABLET BY MOUTH EVERY DAY 07/28/14  Yes Thayer Headings, MD  ferrous sulfate 325 (65 FE) MG tablet Take 325 mg by mouth every morning.    Yes Historical Provider, MD  hydrochlorothiazide (HYDRODIURIL) 25 MG tablet Take 1 tablet (25 mg total) by mouth daily. 08/17/14  Yes Thayer Headings, MD  HYDROcodone-acetaminophen [facility]) 10-325 MG per tablet Take 0.5-1 tablets by mouth every 8 (eight) hours as needed for moderate pain.  12/01/14  Yes Historical Provider, MD  lisinopril (PRINIVIL,ZESTRIL) 40 MG tablet TAKE 1 TABLET BY MOUTH EVERY MORNING 11/19/14  Yes Thayer Headings, MD  Multiple Minerals (CALCIUM-MAGNESIUM-ZINC) TABS Take 1 tablet by mouth daily.   Yes Historical Provider, MD  nitrofurantoin (MACRODANTIN) 100 MG capsule Take 100 mg by mouth daily.  12/22/13  Yes Historical Provider, MD  nitroGLYCERIN (NITROSTAT) 0.4 MG SL tablet Place 0.4 mg under the tongue every 5 (five) minutes x 3 doses as needed for chest pain. For chest pain   Yes Historical Provider, MD  pantoprazole (PROTONIX) 40 MG tablet Take 40 mg by mouth daily.  01/07/14  Yes Historical Provider, MD  potassium chloride SA (K-DUR,KLOR-CON) 20 MEQ tablet Take 1 tablet (20 mEq total) by mouth daily. 08/17/14  Yes Thayer Headings, MD  pramipexole (MIRAPEX) 1 MG tablet Take 1-2 mg by mouth at bedtime.    Yes Historical Provider, MD  venlafaxine (EFFEXOR) 75 MG tablet Take 37.5 mg by mouth 2 (two) times daily.    Yes Historical Provider, MD  carvedilol (COREG) 12.5 MG tablet Take 1 tablet (12.5 mg total) by mouth 2 (two) times daily. 10/20/13 10/20/14  Thayer Headings, MD  meclizine (ANTIVERT) 50 MG tablet Take 1 tablet (50 mg total) by mouth 3 (three) times daily as needed. Patient not taking: Reported on 12/19/2014 11/17/13   Tanna Furry, MD  ondansetron (ZOFRAN ODT) 4 MG disintegrating tablet Take 1 tablet (4 mg total) by mouth every 8 (eight) hours as needed for nausea. Patient not taking: Reported on 12/19/2014 11/17/13   Tanna Furry, MD   rosuvastatin (CRESTOR) 20 MG tablet Take 1 tablet (20 mg total) by mouth every morning. Patient not taking: Reported on 12/19/2014 10/21/13   Thayer Headings, MD   Physical Exam: Blood pressure 150/64, pulse 79, temperature 97.5 F (36.4 C), temperature source Rectal, resp. rate 20, height 5\' 6"  (1.676 m), weight 104.327 kg (230 lb), SpO2 100 %. Filed Vitals:   12/19/14 2025  BP: 150/64  Pulse: 79  Temp: 97.5 F (36.4 C)  Resp: 20    General:  NAD, resting comfortably in bed Eyes: PEERLA EOMI ENT: mucous membranes moist Neck: supple w/o JVD Cardiovascular: RRR w/o MRG Respiratory: CTA B Abdomen: soft, nt, nd, bs+ Skin: no rash nor  lesion Musculoskeletal: MAE, full ROM all 4 extremities Psychiatric: normal tone and affect Neurologic: AAOx3, grossly non-focal  Labs on Admission:  Basic Metabolic Panel:  Recent Labs Lab 12/19/14 1745  NA 138  K 4.5  CL 105  CO2 22  GLUCOSE 110*  BUN 16  CREATININE 1.31*  CALCIUM 9.7   Liver Function Tests: No results for input(s): AST, ALT, ALKPHOS, BILITOT, PROT, ALBUMIN in the last 168 hours. No results for input(s): LIPASE, AMYLASE in the last 168 hours. No results for input(s): AMMONIA in the last 168 hours. CBC:  Recent Labs Lab 12/19/14 1745  WBC 5.8  HGB 10.5*  HCT 32.6*  MCV 89.8  PLT 286   Cardiac Enzymes: No results for input(s): CKTOTAL, CKMB, CKMBINDEX, TROPONINI in the last 168 hours. BNP: Invalid input(s): POCBNP CBG: No results for input(s): GLUCAP in the last 168 hours.  Radiological Exams on Admission: Dg Chest 2 View  12/19/2014   CLINICAL DATA:  Chest pain.  Burning down both arms.  EXAM: CHEST  2 VIEW  COMPARISON:  11/10/2012  FINDINGS: Borderline cardiomegaly. Ill-defined opacities in left lung base, slightly more confluent at the left lung base and left midlung zone. Questionable small left pleural effusion. Probable atelectasis the right lung base. There is bronchial thickening. No pneumothorax. No  acute osseous abnormalities are seen.  IMPRESSION: Ill-defined opacities at the left lung base, slightly more confluent opacity in the left midlung zone. This may reflect pneumonia. Followup PA and lateral chest X-ray is recommended in 3-4 weeks following trial of antibiotic therapy to ensure resolution and exclude underlying malignancy.   Electronically Signed   By: Jeb Levering M.D.   On: 12/19/2014 18:14    EKG: Independently reviewed.  Time spent: 60 min  Jaimes Eckert M. [name] Pager [phone]  If 7PM-7AM, please contact night-coverage [email or web address] Password [facility] 12/19/2014, 8:42 PM

## 2014-12-19 NOTE — ED Notes (Signed)
Pt c/o onset 45 mins PTA with c/o center chest burning that radiates to B/L arms and up neck. Pt also c/o generalized weakness and feeling lightheaded when she stands up. Pt has history of MI

## 2014-12-19 NOTE — ED Notes (Signed)
Provider bedside.

## 2014-12-19 NOTE — ED Provider Notes (Signed)
CSN: [id number]     Arrival date & time 12/19/14  1727 History   First MD Initiated Contact with Patient 12/19/14 1740     Chief Complaint  Patient presents with  . Chest Pain  . Tachycardia  . Heartburn     (Consider location/radiation/quality/duration/timing/severity/associated sxs/prior Treatment) HPI Comments: Patient is a 74 year old female with a past medical history of CAD, hypertension, hyperlipidemia, and 2 previous MI who presents with chest pain that started 45 minutes prior to arrival. She reports sudden onset and constant symptoms. The pain is burning and severe with radiation to bilateral arms and up to her neck. She reports associated lightheadedness. She reports this feels similar to her previous MI. No other associated symptoms. No aggravating/alleviating factors.    Past Medical History  Diagnosis Date  . Hyperlipidemia   . Fatigue   . Anxiety   . Coronary artery disease     a. s/p MI 2009 - PCI/DES distal  RCA w/ 2.75 x 18 Xience DES;  b. 05/2010 Myoview: Apical thinning, EF 73%;  c. 06/2010 Cath: moderate nonobs dzs, EF 65%;  d.  Lexiscan Myoview (07/2013):  No scar or ischemia, EF 67%; Normal Study  . Fibromyalgia   . Essential iris atrophy     Right side  . Menopause   . Glaucoma   . Environmental allergies   . GERD (gastroesophageal reflux disease)   . Osteoarthritis   . OSA (obstructive sleep apnea)   . Obesity (BMI 30-39.9)   . Hypertension    Past Surgical History  Procedure Laterality Date  . Cardiac catheterization  06/29/2010    Mild to moderate coronary artery irregularities.  Her  proximal left anterior descending artery is moderately narrowed.  She  also has an eccentric stenosis in the proximal LAD.  These do not appear  to obstruct flow at present, but they are fairly small vessels.  They  appeared to be between 1.5 and 2 mm in diamete  . Cardiac catheterization  08/02/2009    Patent stent  . Cardiac catheterization  05/12/2008    Stent to the  distal RCA   Family History  Problem Relation Age of Onset  . Heart attack Mother   . Coronary artery disease Brother     had CABG  . Coronary artery disease Brother   . Coronary artery disease Brother   . Coronary artery disease Brother   . Coronary artery disease Brother   . Heart attack Brother   . Cancer Father    History  Substance Use Topics  . Smoking status: Former Smoker    Types: Cigarettes    Quit date: 09/13/1960  . Smokeless tobacco: Never Used  . Alcohol Use: No   OB History    No data available     Review of Systems  Cardiovascular: Positive for chest pain.  Neurological: Positive for light-headedness.  All other systems reviewed and are negative.     Allergies  Codeine; Cortisone; and Septra  Home Medications   Prior to Admission medications   Medication Sig Start Date End Date Taking? Authorizing Provider  amLODipine (NORVASC) 5 MG tablet Take 1 tablet (5 mg total) by mouth daily. 10/20/13   Thayer Headings, MD  aspirin EC 81 MG tablet Take 81 mg by mouth every morning.    Historical Provider, MD  carvedilol (COREG) 12.5 MG tablet Take 1 tablet (12.5 mg total) by mouth 2 (two) times daily. 10/20/13 10/20/14  Thayer Headings, MD  clopidogrel (PLAVIX) 75 MG tablet TAKE 1 TABLET BY MOUTH EVERY DAY 07/28/14   Thayer Headings, MD  ferrous sulfate 325 (65 FE) MG tablet Take 325 mg by mouth every morning.     Historical Provider, MD  fluticasone (FLONASE) 50 MCG/ACT nasal spray Place 1 spray into the nose daily as needed for rhinitis or allergies.     Historical Provider, MD  hydrochlorothiazide (HYDRODIURIL) 25 MG tablet Take 1 tablet (25 mg total) by mouth daily. 08/17/14   Thayer Headings, MD  HYDROcodone-acetaminophen (NORCO/VICODIN) 5-325 MG per tablet  01/04/14   Historical Provider, MD  lisinopril (PRINIVIL,ZESTRIL) 40 MG tablet Take 20 mg by mouth every morning. 10/20/13   Thayer Headings, MD  lisinopril (PRINIVIL,ZESTRIL) 40 MG tablet TAKE 1 TABLET BY MOUTH  EVERY MORNING 11/19/14   Thayer Headings, MD  meclizine (ANTIVERT) 50 MG tablet Take 1 tablet (50 mg total) by mouth 3 (three) times daily as needed. 11/17/13   Tanna Furry, MD  methimazole (TAPAZOLE) 5 MG tablet Take 5 mg by mouth every morning.     Historical Provider, MD  nitrofurantoin (MACRODANTIN) 100 MG capsule  12/22/13   Historical Provider, MD  nitroGLYCERIN (NITROSTAT) 0.4 MG SL tablet Place 0.4 mg under the tongue every 5 (five) minutes x 3 doses as needed for chest pain. For chest pain    Historical Provider, MD  ondansetron (ZOFRAN ODT) 4 MG disintegrating tablet Take 1 tablet (4 mg total) by mouth every 8 (eight) hours as needed for nausea. 11/17/13   Tanna Furry, MD  pantoprazole (PROTONIX) 40 MG tablet  01/07/14   Historical Provider, MD  potassium chloride SA (K-DUR,KLOR-CON) 20 MEQ tablet Take 1 tablet (20 mEq total) by mouth daily. 08/17/14   Thayer Headings, MD  pramipexole (MIRAPEX) 1 MG tablet Take 2 mg by mouth at bedtime.     Historical Provider, MD  rosuvastatin (CRESTOR) 20 MG tablet Take 1 tablet (20 mg total) by mouth every morning. 10/21/13   Thayer Headings, MD  traMADol (ULTRAM) 50 MG tablet Take 50 mg by mouth 3 (three) times daily as needed for pain. For pain    Historical Provider, MD  venlafaxine (EFFEXOR) 75 MG tablet Take 32.5 mg by mouth 2 (two) times daily.     Historical Provider, MD  VOLTAREN 1 % GEL As needed 04/02/13   Historical Provider, MD   BP 90/71 mmHg  Temp(Src) 97.9 F (36.6 C) (Oral)  Resp 20  Ht 5\' 6"  (1.676 m)  Wt 230 lb (104.327 kg)  BMI 37.14 kg/m2  SpO2 94% Physical Exam  Constitutional: She is oriented to person, place, and time. She appears well-developed and well-nourished. No distress.  HENT:  Head: Normocephalic and atraumatic.  Eyes: Conjunctivae and EOM are normal.  Neck: Normal range of motion.  Cardiovascular: Regular rhythm.  Exam reveals no gallop and no friction rub.   No murmur heard. Tachycardia with irregularly irregular  rhythm.   Pulmonary/Chest: Effort normal and breath sounds normal. She has no wheezes. She has no rales. She exhibits no tenderness.  Abdominal: Soft. She exhibits no distension. There is no tenderness. There is no rebound.  Musculoskeletal: Normal range of motion.  Neurological: She is alert and oriented to person, place, and time. Coordination normal.  Speech is goal-oriented. Moves limbs without ataxia.   Skin: Skin is warm and dry.  Psychiatric: She has a normal mood and affect. Her behavior is normal.  Nursing note and vitals reviewed.  ED Course  Procedures (including critical care time) Labs Review Labs Reviewed  BASIC METABOLIC PANEL - Abnormal; Notable for the following:    Glucose, Bld 110 (*)    Creatinine, Ser 1.31 (*)    GFR calc non Af Amer 40 (*)    GFR calc Af Amer 46 (*)    All other components within normal limits  CBC - Abnormal; Notable for the following:    RBC 3.63 (*)    Hemoglobin 10.5 (*)    HCT 32.6 (*)    All other components within normal limits  TSH  T4, FREE  BRAIN NATRIURETIC PEPTIDE  I-STAT TROPOININ, ED    Imaging Review Dg Chest 2 View  12/19/2014   CLINICAL DATA:  Chest pain.  Burning down both arms.  EXAM: CHEST  2 VIEW  COMPARISON:  11/10/2012  FINDINGS: Borderline cardiomegaly. Ill-defined opacities in left lung base, slightly more confluent at the left lung base and left midlung zone. Questionable small left pleural effusion. Probable atelectasis the right lung base. There is bronchial thickening. No pneumothorax. No acute osseous abnormalities are seen.  IMPRESSION: Ill-defined opacities at the left lung base, slightly more confluent opacity in the left midlung zone. This may reflect pneumonia. Followup PA and lateral chest X-ray is recommended in 3-4 weeks following trial of antibiotic therapy to ensure resolution and exclude underlying malignancy.   Electronically Signed   By: Jeb Levering M.D.   On: 12/19/2014 18:14     EKG  Interpretation   Date/Time:  Sunday December 19 2014 17:34:23 EDT Ventricular Rate:  134 PR Interval:    QRS Duration: 74 QT Interval:  318 QTC Calculation: 474 R Axis:   59 Text Interpretation:  Atrial fibrillation with rapid ventricular response  Low voltage QRS Nonspecific ST and T wave abnormality Abnormal ECG  compared to prior, rhythm now a fib with RVR.   nonspecific ST/T  abnormality Confirmed by [facility]  MD, TREY ([id number]) on 12/19/2014 6:04:38 PM      MDM   Final diagnoses:  New onset atrial fibrillation    5:44 PM Patient's EKG shows atrial fibrillation with RVR, Pulse 134 at this time. No history of afib. Chest xray, labs, and troponin pending. Remaining vitals stable and patient afebrile.     Alvina Chou, PA-C 12/19/14 2238  Serita Grit, MD 12/19/14 2306

## 2014-12-19 NOTE — Consult Note (Deleted)
Patient ID: [name]  MRN: [id number], DOB/AGE: 11-01-1940   Admit date: 12/19/2014  Primary Physician: Donnie Coffin, MD  Primary Cardiologist:  Dr. Acie Fredrickson  Problem List  Past Medical History  Diagnosis Date  . Hyperlipidemia   . Fatigue   . Anxiety   . Coronary artery disease     a. s/p MI 2009 - PCI/DES distal  RCA w/ 2.75 x 18 Xience DES;  b. 05/2010 Myoview: Apical thinning, EF 73%;  c. 06/2010 Cath: moderate nonobs dzs, EF 65%;  d.  Lexiscan Myoview (07/2013):  No scar or ischemia, EF 67%; Normal Study  . Fibromyalgia   . Essential iris atrophy     Right side  . Menopause   . Glaucoma   . Environmental allergies   . GERD (gastroesophageal reflux disease)   . Osteoarthritis   . OSA (obstructive sleep apnea)   . Obesity (BMI 30-39.9)   . Hypertension     Past Surgical History  Procedure Laterality Date  . Cardiac catheterization  06/29/2010    Mild to moderate coronary artery irregularities.  Her  proximal left anterior descending artery is moderately narrowed.  She  also has an eccentric stenosis in the proximal LAD.  These do not appear  to obstruct flow at present, but they are fairly small vessels.  They  appeared to be between 1.5 and 2 mm in diamete  . Cardiac catheterization  08/02/2009    Patent stent  . Cardiac catheterization  05/12/2008    Stent to the distal RCA    Allergies  Allergies  Allergen Reactions  . Codeine Nausea And Vomiting and Other (See Comments)    Pt gets very hot and flushed  . Cortisone Nausea Only and Other (See Comments)    Pt gets very hot and flushed  . Septra [Sulfamethoxazole-Trimethoprim]   . Sulfamethoxazole-Trimethoprim    HPI:  74 yo F patient of Dr. Acie Fredrickson w a h/o CAD s/p 2.75x18 Xience DES to dRCA (2009), HTN, HLD, CKD, Hyperthyroidism s/p recent radioactive iodine therapy (04/02/14), Chronic fatigue, Fibromyalgia, & Anxiety presented this evening with a 2 hour history of an upper chest burning sensation with throat  tightness associated with lightheadedness & tingling in her bilateral fingers, found to be in Afib with RVR (HR 134, BP 90/71), which resolved shortly after arrival without medical intervention.  Her symptoms accordingly resolved.  She clarified that the sensation she was experiencing was different than angina she experienced with her prior MI.  The last time she could recall experiencing CP was during an ED presentation to our facility 09/05/14 because of hypotension that she had found on home measurements that did not correlate with our measurements.  She denied palpitations or syncope.   note, the patient had experienced URI-type symptoms with a productive cough, fevers, & myalgias the week prior for which she was evaluated at the office of her PCP 12/16/14 & started on a 5 day course of Azithromycin, which has significant improved her symptoms.      When I was completing my review of systems, she became tearful regarding how debilitated she has become in the preceding several months - unable to walk more than a few steps before becoming short of breath & "completely exhausted."  She had been attributing this to her thyroid has not recently sough evaluation for this as she is in transition between endocrinologist (see below).  Along with DOE, she noted of 10 lbs over the last 3 months & LE  swelling.  She sleeps with her bed elevated at baseline due to a hiatal hernia.  She denied paroxysmal nocturnal dyspnea or early satiety.  She had not discussed with Dr. Acie Fredrickson yet as she will see him again 01/01/15.   Regarding her hyperthyroidism, she recently was diagnosed with nodules in 2007, which were thought to be consistent with a multinodular goiter per a nuclear scan in 2012.  She was treated with Tapazole until mid-2015.  She then saw Dr. Armandina Gemma with [facility] 01/12/14, who ordered a follow-up ultrasound that revealed growth compared to prior.  A subsequent toxic nodule was identified per  nuclear imaging in 03/11/14, & she underwent radioactive iodine therapy 04/02/14 under the guidance of Dr. Delrae Rend.  Follow-up imaging revealed a decrease in the size of the toxic nodule.  She has not followed-up with Dr. Buddy Duty for unclear reasons & is in th process of trying to establish care with a new endocrinologist Dr. Jacelyn Pi.  Home Medications  Prior to Admission medications   Medication Sig Start Date End Date Taking? Authorizing Provider  amLODipine (NORVASC) 5 MG tablet Take 1 tablet (5 mg total) by mouth daily. 10/20/13  Yes Thayer Headings, MD  aspirin EC 81 MG tablet Take 81 mg by mouth every morning.   Yes Historical Provider, MD  azithromycin (ZITHROMAX) 250 MG tablet Take 250-500 mg by mouth daily. 12/15/14  Yes Historical Provider, MD  carvedilol (COREG) 12.5 MG tablet Take 12.5 mg by mouth 2 (two) times daily with a meal.  10/20/13  Yes Historical Provider, MD  clopidogrel (PLAVIX) 75 MG tablet TAKE 1 TABLET BY MOUTH EVERY DAY 07/28/14  Yes Thayer Headings, MD  ferrous sulfate 325 (65 FE) MG tablet Take 325 mg by mouth every morning.    Yes Historical Provider, MD  hydrochlorothiazide (HYDRODIURIL) 25 MG tablet Take 1 tablet (25 mg total) by mouth daily. 08/17/14  Yes Thayer Headings, MD  HYDROcodone-acetaminophen [facility]) 10-325 MG per tablet Take 0.5-1 tablets by mouth every 8 (eight) hours as needed for moderate pain.  12/01/14  Yes Historical Provider, MD  lisinopril (PRINIVIL,ZESTRIL) 40 MG tablet TAKE 1 TABLET BY MOUTH EVERY MORNING 11/19/14  Yes Thayer Headings, MD  Multiple Minerals (CALCIUM-MAGNESIUM-ZINC) TABS Take 1 tablet by mouth daily.   Yes Historical Provider, MD  nitrofurantoin (MACRODANTIN) 100 MG capsule Take 100 mg by mouth daily.  12/22/13  Yes Historical Provider, MD  nitroGLYCERIN (NITROSTAT) 0.4 MG SL tablet Place 0.4 mg under the tongue every 5 (five) minutes x 3 doses as needed for chest pain. For chest pain   Yes Historical Provider, MD  pantoprazole  (PROTONIX) 40 MG tablet Take 40 mg by mouth daily.  01/07/14  Yes Historical Provider, MD  potassium chloride SA (K-DUR,KLOR-CON) 20 MEQ tablet Take 1 tablet (20 mEq total) by mouth daily. 08/17/14  Yes Thayer Headings, MD  pramipexole (MIRAPEX) 1 MG tablet Take 1-2 mg by mouth at bedtime.    Yes Historical Provider, MD  venlafaxine (EFFEXOR) 75 MG tablet Take 37.5 mg by mouth 2 (two) times daily.    Yes Historical Provider, MD  carvedilol (COREG) 12.5 MG tablet Take 1 tablet (12.5 mg total) by mouth 2 (two) times daily. 10/20/13 10/20/14  Thayer Headings, MD   Family History  Family History  Problem Relation Age of Onset  . Heart attack Mother   . Coronary artery disease Brother     had CABG  . Coronary artery disease  Brother   . Coronary artery disease Brother   . Coronary artery disease Brother   . Coronary artery disease Brother   . Heart attack Brother   . Cancer Father    Social History  History   Social History  . Marital Status: Divorced    Spouse Name: N/A  . Number of Children: N/A  . Years of Education: N/A   Occupational History  . Not on file.   Social History Main Topics  . Smoking status: Former Smoker    Types: Cigarettes    Quit date: 09/13/1960  . Smokeless tobacco: Never Used  . Alcohol Use: No  . Drug Use: No  . Sexual Activity: Not on file   Other Topics Concern  . Not on file   Social History Narrative    Review of Systems:  A 12 point ROS was negative except as indicated in HPI.  Physical Exam  Blood pressure 120/74, pulse 57, temperature 97.5 F (36.4 C), temperature source Rectal, resp. rate 13, height 5\' 6"  (1.676 m), weight 104.327 kg (230 lb), SpO2 97 %.  General: Pleasant, NAD Psych: Normal affect. Neuro: Alert and oriented X 3. Moves all extremities spontaneously. HEENT: Normal  Neck: JVD to 2 cm above clavicle Lungs:  Mild bilateral lower rales Heart: RRR no s3, s4, or murmurs. Abdomen: Soft, non-tender, non-distended, BS + x 4.    Extremities: No clubbing, cyanosis or edema. DP/PT/Radials 2+ and equal bilaterally.  Labs  Troponin [facility] Test)  Recent Labs  12/19/14 1811  TROPIPOC 0.02   No results for input(s): CKTOTAL, CKMB, TROPONINI in the last 72 hours. Lab Results  Component Value Date   WBC 5.8 12/19/2014   HGB 10.5* 12/19/2014   HCT 32.6* 12/19/2014   MCV 89.8 12/19/2014   PLT 286 12/19/2014    Recent Labs Lab 12/19/14 1745  NA 138  K 4.5  CL 105  CO2 22  BUN 16  CREATININE 1.31*  CALCIUM 9.7  GLUCOSE 110*   Lab Results  Component Value Date   CHOL 232* 03/02/2014   HDL 36.20* 03/02/2014   LDLCALC 171* 03/02/2014   TRIG 126.0 03/02/2014   No results found for: DDIMER   Radiology/Studies  Dg Chest 2 View  12/19/2014   CLINICAL DATA:  Chest pain.  Burning down both arms.  EXAM: CHEST  2 VIEW  COMPARISON:  11/10/2012  FINDINGS: Borderline cardiomegaly. Ill-defined opacities in left lung base, slightly more confluent at the left lung base and left midlung zone. Questionable small left pleural effusion. Probable atelectasis the right lung base. There is bronchial thickening. No pneumothorax. No acute osseous abnormalities are seen.  IMPRESSION: Ill-defined opacities at the left lung base, slightly more confluent opacity in the left midlung zone. This may reflect pneumonia. Followup PA and lateral chest X-ray is recommended in 3-4 weeks following trial of antibiotic therapy to ensure resolution and exclude underlying malignancy.   Electronically Signed   By: Jeb Levering M.D.   On: 12/19/2014 18:14   ASSESSMENT AND PLAN:  74 yo F patient of Dr. Acie Fredrickson w a h/o CAD s/p 2.75x18 Xience DES to dRCA (2009), HTN, HLD, CKD, Hyperthyroidism s/p recent radioactive iodine therapy (04/02/14), Chronic fatigue, Fibromyalgia, & Anxiety   # New onset Afib - CHA2DS2-VASc = 3.  Regarding etiology, this may have been precipitated by her recent URI with evidence of infiltrate on her CXR, though she  is not hypoxic.  I am reassured by her normal TSH &  FT4 this evening.   - Will plan for an echocardiogram to r/o structural disease, specifically left atrial that may be contributing.   - Up-titration of AVN blockade may be difficult given that her baseline HR is in the 50's to 60's with DBP observed to be in the 40's.   - Hesitant to start anti-arrhythmic therapy, especially given the possibility of recent PNA as a reversible cause.   - As some can be better responders to CCB rather than BB for AVNB will transition from Carvedilol to Diltiazem to see if she is able to tolerate.   - With her age & renal function, Eliquis would be my anticoagulant of choice.  However, I would like assurance that this will be covered by her insurance.  Will treat with Lovenox overnight until this is clarified.   - The other issue that would need to be address would be her DAPT.  Given that she had a single stent 7 years prior, she likely no longer needs the Plavix.  Will hold for now but likely touch base with her primary cardiologist tomorrow.   # h/o CAD - Her symptoms this evening were different than her previous angina.  Her EKG & initial troponin are unremarkable.  Her last ischemic evaluation was a Myoview from 07/2013, which revealed an EF of 67% without ischemia. - Cycle troponin overnight.   - Will continue her home ASA. - As per above, will hold Clopidogrel with the caveat that we should touch base with Dr. Acie Fredrickson tomorrow.    # DOE - No over systolic or diastolic dysfunction observed on her echo from 2011.  EF preserved per her Myoview in 2015.  BNP 221, though difficult to interpret with obesity.  She appear mildly volume overloaded on exam with JVD 2 cm above her clavicle, trace PE, & mild bilateral rales. - Will plan for an echocardiogram as per above. - Will provide a single dose of IV Lasix this evening to replace her HCTZ to see if this provides any improvement in her symptoms.    # Recent URI - Will  complete her Azithromycin with her final dose tomorrow.  # h/o CKD - Near her recent baseline of 1.2 to 1.5.   - Will monitor her Cr with providing Lasix.    # h/o HTN - Hypotensive with Afib, normotensive otherwise.    # h/o OSA - Intolerant of CPAP  # PPX - Lovenox, PPI  # Full code  Signed, Alfonso Ramus, MD 12/19/2014, 11:55 PM

## 2014-12-20 ENCOUNTER — Other Ambulatory Visit: Payer: Self-pay | Admitting: Physician Assistant

## 2014-12-20 DIAGNOSIS — I4891 Unspecified atrial fibrillation: Secondary | ICD-10-CM | POA: Insufficient documentation

## 2014-12-20 DIAGNOSIS — I10 Essential (primary) hypertension: Secondary | ICD-10-CM | POA: Diagnosis not present

## 2014-12-20 DIAGNOSIS — I2510 Atherosclerotic heart disease of native coronary artery without angina pectoris: Secondary | ICD-10-CM | POA: Diagnosis not present

## 2014-12-20 LAB — CBC
HEMATOCRIT: 30.9 % — AB (ref 36.0–46.0)
Hemoglobin: 9.8 g/dL — ABNORMAL LOW (ref 12.0–15.0)
MCH: 28.7 pg (ref 26.0–34.0)
MCHC: 31.7 g/dL (ref 30.0–36.0)
MCV: 90.4 fL (ref 78.0–100.0)
Platelets: 249 10*3/uL (ref 150–400)
RBC: 3.42 MIL/uL — ABNORMAL LOW (ref 3.87–5.11)
RDW: 14.1 % (ref 11.5–15.5)
WBC: 5.4 10*3/uL (ref 4.0–10.5)

## 2014-12-20 LAB — TROPONIN I
TROPONIN I: 0.03 ng/mL (ref <0.031)
Troponin I: 0.03 ng/mL (ref <0.031)
Troponin I: 0.03 ng/mL (ref <0.031)

## 2014-12-20 LAB — BASIC METABOLIC PANEL
Anion gap: 7 (ref 5–15)
BUN: 14 mg/dL (ref 6–20)
CO2: 26 mmol/L (ref 22–32)
Calcium: 9.5 mg/dL (ref 8.9–10.3)
Chloride: 107 mmol/L (ref 101–111)
Creatinine, Ser: 1.36 mg/dL — ABNORMAL HIGH (ref 0.44–1.00)
GFR calc Af Amer: 44 mL/min — ABNORMAL LOW (ref >60)
GFR calc non Af Amer: 38 mL/min — ABNORMAL LOW (ref >60)
Glucose, Bld: 106 mg/dL — ABNORMAL HIGH (ref 65–99)
Potassium: 4.8 mmol/L (ref 3.5–5.1)
Sodium: 140 mmol/L (ref 135–145)

## 2014-12-20 LAB — PROTIME-INR
INR: 1.09 (ref 0.00–1.49)
Prothrombin Time: 14.3 seconds (ref 11.6–15.2)

## 2014-12-20 LAB — MAGNESIUM: MAGNESIUM: 1.9 mg/dL (ref 1.7–2.4)

## 2014-12-20 MED ORDER — AZITHROMYCIN 250 MG PO TABS
250.0000 mg | ORAL_TABLET | Freq: Once | ORAL | Status: DC
Start: 2014-12-21 — End: 2014-12-20
  Filled 2014-12-20: qty 1

## 2014-12-20 MED ORDER — OFF THE BEAT BOOK
Freq: Once | Status: AC
  Administered 2014-12-20: 14:00:00
  Filled 2014-12-20: qty 1

## 2014-12-20 MED ORDER — DILTIAZEM HCL ER COATED BEADS 120 MG PO CP24: 120.0000 mg | ORAL_CAPSULE | Freq: Every day | ORAL | Status: DC

## 2014-12-20 MED ORDER — FUROSEMIDE 10 MG/ML IJ SOLN
20.0000 mg | Freq: Once | INTRAMUSCULAR | Status: AC
  Administered 2014-12-20: 20 mg via INTRAVENOUS
  Filled 2014-12-20: qty 2

## 2014-12-20 MED ORDER — APIXABAN 5 MG PO TABS: 5.0000 mg | ORAL_TABLET | Freq: Two times a day (BID) | ORAL | Status: DC

## 2014-12-20 MED ORDER — ASPIRIN EC 81 MG PO TBEC: 81.0000 mg | DELAYED_RELEASE_TABLET | Freq: Every day | ORAL | Status: DC

## 2014-12-20 MED ORDER — DILTIAZEM HCL ER COATED BEADS 120 MG PO CP24
120.0000 mg | ORAL_CAPSULE | Freq: Every day | ORAL | Status: DC
  Filled 2014-12-20: qty 1

## 2014-12-20 MED ORDER — NITROFURANTOIN MACROCRYSTAL 100 MG PO CAPS
100.0000 mg | ORAL_CAPSULE | Freq: Every day | ORAL | Status: DC
  Administered 2014-12-20: 100 mg via ORAL
  Filled 2014-12-20: qty 1

## 2014-12-20 MED ORDER — PANTOPRAZOLE SODIUM 40 MG PO TBEC
40.0000 mg | DELAYED_RELEASE_TABLET | Freq: Every day | ORAL | Status: DC
  Administered 2014-12-20: 40 mg via ORAL
  Filled 2014-12-20: qty 1

## 2014-12-20 MED ORDER — FERROUS SULFATE 325 (65 FE) MG PO TABS
325.0000 mg | ORAL_TABLET | Freq: Every morning | ORAL | Status: DC
  Administered 2014-12-20: 325 mg via ORAL
  Filled 2014-12-20: qty 1

## 2014-12-20 MED ORDER — DILTIAZEM HCL 30 MG PO TABS
30.0000 mg | ORAL_TABLET | Freq: Four times a day (QID) | ORAL | Status: DC
  Administered 2014-12-20 (×2): 30 mg via ORAL
  Filled 2014-12-20 (×6): qty 1

## 2014-12-20 MED ORDER — APIXABAN 5 MG PO TABS
5.0000 mg | ORAL_TABLET | Freq: Two times a day (BID) | ORAL | Status: DC
  Filled 2014-12-20 (×2): qty 1

## 2014-12-20 MED ORDER — VENLAFAXINE HCL 37.5 MG PO TABS: 37.5000 mg | ORAL_TABLET | Freq: Two times a day (BID) | ORAL | Status: DC

## 2014-12-20 MED ORDER — SODIUM CHLORIDE 0.9 % IJ SOLN
3.0000 mL | Freq: Two times a day (BID) | INTRAMUSCULAR | Status: DC
  Administered 2014-12-20 (×2): 3 mL via INTRAVENOUS

## 2014-12-20 MED ORDER — AMLODIPINE BESYLATE 5 MG PO TABS
5.0000 mg | ORAL_TABLET | Freq: Every day | ORAL | Status: DC
  Administered 2014-12-20: 5 mg via ORAL
  Filled 2014-12-20: qty 1

## 2014-12-20 MED ORDER — HYDROCODONE-ACETAMINOPHEN 10-325 MG PO TABS
0.5000 | ORAL_TABLET | Freq: Three times a day (TID) | ORAL | Status: DC | PRN
  Filled 2014-12-20: qty 1

## 2014-12-20 MED ORDER — ASPIRIN EC 81 MG PO TBEC
81.0000 mg | DELAYED_RELEASE_TABLET | Freq: Every morning | ORAL | Status: DC
  Administered 2014-12-20: 81 mg via ORAL
  Filled 2014-12-20: qty 1

## 2014-12-20 MED ORDER — ENOXAPARIN SODIUM 120 MG/0.8ML ~~LOC~~ SOLN
1.0000 mg/kg | SUBCUTANEOUS | Status: AC
  Administered 2014-12-20: 105 mg via SUBCUTANEOUS
  Filled 2014-12-20: qty 0.8

## 2014-12-20 MED ORDER — VENLAFAXINE HCL 37.5 MG PO TABS
37.5000 mg | ORAL_TABLET | Freq: Two times a day (BID) | ORAL | Status: DC
  Administered 2014-12-20 (×2): 37.5 mg via ORAL
  Filled 2014-12-20 (×3): qty 1

## 2014-12-20 MED ORDER — ENOXAPARIN SODIUM 120 MG/0.8ML ~~LOC~~ SOLN
1.0000 mg/kg | Freq: Two times a day (BID) | SUBCUTANEOUS | Status: DC
  Filled 2014-12-20 (×2): qty 0.8

## 2014-12-20 MED ORDER — LISINOPRIL 40 MG PO TABS
40.0000 mg | ORAL_TABLET | Freq: Every morning | ORAL | Status: DC
  Administered 2014-12-20: 40 mg via ORAL
  Filled 2014-12-20: qty 1

## 2014-12-20 MED ORDER — PRAMIPEXOLE DIHYDROCHLORIDE 1 MG PO TABS
1.0000 mg | ORAL_TABLET | Freq: Every day | ORAL | Status: DC
  Administered 2014-12-20: 2 mg via ORAL
  Filled 2014-12-20 (×2): qty 2

## 2014-12-20 MED ORDER — PRAMIPEXOLE DIHYDROCHLORIDE 1 MG PO TABS: 1.0000 mg | ORAL_TABLET | Freq: Every day | ORAL | Status: DC

## 2014-12-20 NOTE — Progress Notes (Signed)
Received report from [place]. Madhuri Vacca Doree Fudge @[phone] 

## 2014-12-20 NOTE — Discharge Summary (Signed)
Discharge Summary   Patient ID: LAQUIA ROSANO,  MRN: [id number], DOB/AGE: December 15, 1940 74 y.o.  Admit date: 12/19/2014 Discharge date: 12/20/2014  Primary Care Provider: Donnie Coffin Primary Cardiologist: Dr. Acie Fredrickson  Discharge Diagnoses Principal Problem:   Atrial fibrillation with RVR Active Problems:   Coronary artery disease   Hypertension   Hyperlipidemia   OSA (obstructive sleep apnea)   Atrial fibrillation   New onset atrial fibrillation   Allergies Allergies  Allergen Reactions  . Codeine Nausea And Vomiting and Other (See Comments)    Pt gets very hot and flushed  . Cortisone Nausea Only and Other (See Comments)    Pt gets very hot and flushed  . Septra [Sulfamethoxazole-Trimethoprim]   . Sulfamethoxazole-Trimethoprim     Procedures: none   History of Present Illness  74 yo F patient with a h/o CAD s/p 2.75x18 Xience DES to dRCA (2009), HTN, HLD, CKD, Hyperthyroidism s/p recent radioactive iodine therapy (04/02/14), Chronic fatigue, Fibromyalgia, & Anxiety presented 7/31 with a 2 hour history of an upper chest burning sensation with throat tightness associated with lightheadedness & tingling in her bilateral fingers, found to be in Afib with RVR (HR 134, BP 90/71), which resolved shortly after arrival without medical intervention. Her symptoms  resolved. She clarified that the sensation she was experiencing was different than angina she experienced with her prior MI.   The last time she could recall experiencing CP was during an ED presentation to our facility 09/05/14 because of hypotension that she had found on home measurements that did not correlate with our measurements. She denied palpitations or syncope. Of note, the patient had experienced URI-type symptoms with a productive cough, fevers, & myalgias the week prior for which she was evaluated at the office of her PCP 12/16/14 & started on a 5 day course of Azithromycin, which has significantly improved her  symptoms.   Over the past few months she has been experiencing SOB with exertion. She had been attributing this to her thyroid but has not recently sought evaluation for this as she is in transition between endocrinologists (see below). Along with DOE, she noted of 10 lbs over the last 3 months & LE swelling. She sleeps with her bed elevated at baseline due to a hiatal hernia. She denied paroxysmal nocturnal dyspnea or early satiety.   Regarding her hyperthyroidism, she recently was diagnosed with nodules in 2007, which were thought to be consistent with a multinodular goiter per a nuclear scan in 2012. She was treated with Tapazole until mid-2015. She then saw Dr. Armandina Gemma with [facility] 01/12/14, who ordered a follow-up ultrasound that revealed growth compared to prior. A subsequent toxic nodule was identified per nuclear imaging in 03/11/14, & she underwent radioactive iodine therapy 04/02/14 under the guidance of Dr. Delrae Rend. Follow-up imaging revealed a decrease in the size of the toxic nodule. She has not followed-up with Dr. Buddy Duty for unclear reasons & is in the process of trying to establish care with a new endocrinologist Dr. Jacelyn Pi.  Hospital Course  CHA2DS2-VASc = 3. She maintained sinus rhythm overnight. Her Afib, likely precipitated by her recent URI with evidence of infiltrate on her CXR, though she is not hypoxic. Her TSH & FT4 were normal. Up-titration of AVN blockade was difficult given that her baseline HR in the 50's to 60's with DBP observed to be in the 40's. As some can be better responders to CCB rather than BB for AVNB, she was transition from Carvedilol  to Diltiazem. Her Norvasc was discontinued. She was treated with Lovenox overnight for anticoagulation due to cost and started on Eliquis next day. She appeared mildly volume overloaded on exam with JVD 2 cm above her clavicle, trace PE, & mild bilateral rales on admission, given IV lasix  20mg  once. She was euvolemic on discharge. She Plavix was discontinued as her single stent was 7 years ago and also she started on Eliquis. She was given scrip for 30 days free supplier of Eliquis. Her aspirin was continued on discharge. Consider to stop aspirin as outpatient due to bleeding risk. EF preserved per her Myoview in 2015,  outpatient ECHO ordered. TSH and Free T4 were normal. BP was stable on discharge.   Discharge Vitals Blood pressure 156/52, pulse 62, temperature 97.9 F (36.6 C), temperature source Oral, resp. rate 18, height 5\' 6"  (1.676 m), weight 230 lb (104.327 kg), SpO2 98 %.  Filed Weights   12/19/14 1731 12/20/14 0446  Weight: 230 lb (104.327 kg) 230 lb (104.327 kg)     CBC  Recent Labs  12/19/14 1745 12/20/14 0700  WBC 5.8 5.4  HGB 10.5* 9.8*  HCT 32.6* 30.9*  MCV 89.8 90.4  PLT 286 224   Basic Metabolic Panel  Recent Labs  12/19/14 1745 12/20/14 0700  NA 138 140  K 4.5 4.8  CL 105 107  CO2 22 26  GLUCOSE 110* 106*  BUN 16 14  CREATININE 1.31* 1.36*  CALCIUM 9.7 9.5  MG  --  1.9    Recent Labs  12/20/14 0105 12/20/14 0700  TROPONINI 0.03 0.03   Thyroid Function Tests  Recent Labs  12/19/14 2049  TSH 1.882    Disposition  Pt is being discharged home today in good condition.  Follow-up Plans & Appointments  Follow-up Information    Follow up with Nahser, Wonda Cheng, MD.   Specialty:  Cardiology   Why:  Keep appointment dr. Acie Fredrickson 12/29/14 @ 11:00   Contact information:   [address] [place] [address] [phone]       Follow up with [facility] Office On 12/29/2014.   Specialty:  Cardiology   Why:  @ 10:00 for echocardiogram    Contact information:   [address], [place] [phone]      Discharge Instructions    Diet - low sodium heart healthy    Complete by:  As directed      Increase activity slowly    Complete by:  As directed             F/u Labs/Studies  Discharge Medications    Medication List    STOP taking these medications        amLODipine 5 MG tablet  Commonly known as:  NORVASC     carvedilol 12.5 MG tablet  Commonly known as:  COREG     clopidogrel 75 MG tablet  Commonly known as:  PLAVIX      TAKE these medications        apixaban 5 MG Tabs tablet  Commonly known as:  ELIQUIS  Take 1 tablet (5 mg total) by mouth 2 (two) times daily.     aspirin EC 81 MG tablet  Take 81 mg by mouth every morning.     azithromycin 250 MG tablet  Commonly known as:  ZITHROMAX  Take 250-500 mg by mouth daily.     Calcium-Magnesium-Zinc Tabs  Take 1 tablet by  mouth daily.     diltiazem 120 MG 24 hr capsule  Commonly known as:  CARDIZEM CD  Take 1 capsule (120 mg total) by mouth daily.     ferrous sulfate 325 (65 FE) MG tablet  Take 325 mg by mouth every morning.     hydrochlorothiazide 25 MG tablet  Commonly known as:  HYDRODIURIL  Take 1 tablet (25 mg total) by mouth daily.     HYDROcodone-acetaminophen 10-325 MG per tablet  Commonly known as:  NORCO  Take 0.5-1 tablets by mouth every 8 (eight) hours as needed for moderate pain.     lisinopril 40 MG tablet  Commonly known as:  PRINIVIL,ZESTRIL  TAKE 1 TABLET BY MOUTH EVERY MORNING     nitrofurantoin 100 MG capsule  Commonly known as:  MACRODANTIN  Take 100 mg by mouth daily.     nitroGLYCERIN 0.4 MG SL tablet  Commonly known as:  NITROSTAT  Place 0.4 mg under the tongue every 5 (five) minutes x 3 doses as needed for chest pain. For chest pain     pantoprazole 40 MG tablet  Commonly known as:  PROTONIX  Take 40 mg by mouth daily.     potassium chloride SA 20 MEQ tablet  Commonly known as:  K-DUR,KLOR-CON  Take 1 tablet (20 mEq total) by mouth daily.     pramipexole 1 MG tablet  Commonly known as:  MIRAPEX  Take 1-2 mg by mouth at bedtime.     venlafaxine 75 MG tablet  Commonly known as:  EFFEXOR  Take 37.5 mg by mouth 2 (two)  times daily.        Duration of Discharge Encounter   Greater than 30 minutes including physician time.  Signed, Bhagat,Bhavinkumar PA-C 12/20/2014, 11:36 AM    Personally seen and examined. Agree with above. Feels better. Teary eyed with her current multiple medical issues.  Troponin normal serially. Recent myoview 2015 normal. Comfortable with DC home. Has close follow up on 8/10 with Dr. Acie Fredrickson.  Comfortable with ECHO as outpatient (new afib). No active signs of HF.  Recent azithromycin for bronchitis.   -New onset afib - will start Eliquis 5mg  BID - Will stop Plavix   - Consider DC ASA with concomitant Eliquis.  - consolidate diltiazem to 120 CD QD - stop home beta blocker (she seemed to respond well to IV dilt in ER, prompt conversion). Understand that traditional medication is bb in setting of underlying thyroid issues (currently euthyroid - TSH free T4 normal).   Candee Furbish, MD

## 2014-12-20 NOTE — Progress Notes (Addendum)
Attempted to reach ER nurse for report and no one answered when transferred. Jaquelyne Firkus Doree Fudge @2345 

## 2014-12-20 NOTE — Discharge Instructions (Signed)
Atrial Fibrillation °Atrial fibrillation is a type of irregular heart rhythm (arrhythmia). During atrial fibrillation, the upper chambers of the heart (atria) quiver continuously in a chaotic pattern. This causes an irregular and often rapid heart rate.  °Atrial fibrillation is the result of the heart becoming overloaded with disorganized signals that tell it to beat. These signals are normally released one at a time by a part of the right atrium called the sinoatrial node. They then travel from the atria to the lower chambers of the heart (ventricles), causing the atria and ventricles to contract and pump blood as they pass. In atrial fibrillation, parts of the atria outside of the sinoatrial node also release these signals. This results in two problems. First, the atria receive so many signals that they do not have time to fully contract. Second, the ventricles, which can only receive one signal at a time, beat irregularly and out of rhythm with the atria.  °There are three types of atrial fibrillation:  °· Paroxysmal. Paroxysmal atrial fibrillation starts suddenly and stops on its own within a week. °· Persistent. Persistent atrial fibrillation lasts for more than a week. It may stop on its own or with treatment. °· Permanent. Permanent atrial fibrillation does not go away. Episodes of atrial fibrillation may lead to permanent atrial fibrillation. °Atrial fibrillation can prevent your heart from pumping blood normally. It increases your risk of stroke and can lead to heart failure.  °CAUSES  °· Heart conditions, including a heart attack, heart failure, coronary artery disease, and heart valve conditions.   °· Inflammation of the sac that surrounds the heart (pericarditis). °· Blockage of an artery in the lungs (pulmonary embolism). °· Pneumonia or other infections. °· Chronic lung disease. °· Thyroid problems, especially if the thyroid is overactive (hyperthyroidism). °· Caffeine, excessive alcohol use, and use  of some illegal drugs.   °· Use of some medicines, including certain decongestants and diet pills. °· Heart surgery.   °· Birth defects.   °Sometimes, no cause can be found. When this happens, the atrial fibrillation is called lone atrial fibrillation. The risk of complications from atrial fibrillation increases if you have lone atrial fibrillation and you are age 60 years or older. °RISK FACTORS °· Heart failure. °· Coronary artery disease. °· Diabetes mellitus.   °· High blood pressure (hypertension).   °· Obesity.   °· Other arrhythmias.   °· Increased age. °SIGNS AND SYMPTOMS  °· A feeling that your heart is beating rapidly or irregularly.   °· A feeling of discomfort or pain in your chest.   °· Shortness of breath.   °· Sudden light-headedness or weakness.   °· Getting tired easily when exercising.   °· Urinating more often than normal (mainly when atrial fibrillation first begins).   °In paroxysmal atrial fibrillation, symptoms may start and suddenly stop. °DIAGNOSIS  °Your health care provider may be able to detect atrial fibrillation when taking your pulse. Your health care provider may have you take a test called an ambulatory electrocardiogram (ECG). An ECG records your heartbeat patterns over a 24-hour period. You may also have other tests, such as: °· Transthoracic echocardiogram (TTE). During echocardiography, sound waves are used to evaluate how blood flows through your heart. °· Transesophageal echocardiogram (TEE). °· Stress test. There is more than one type of stress test. If a stress test is needed, ask your health care provider about which type is best for you. °· Chest X-ray exam. °· Blood tests. °· Computed tomography (CT). °TREATMENT  °Treatment may include: °· Treating any underlying conditions. For example, if you   have an overactive thyroid, treating the condition may correct atrial fibrillation.  Taking medicine. Medicines may be given to control a rapid heart rate or to prevent blood  clots, heart failure, or a stroke.  Having a procedure to correct the rhythm of the heart:  Electrical cardioversion. During electrical cardioversion, a controlled, low-energy shock is delivered to the heart through your skin. If you have chest pain, very low blood pressure, or sudden heart failure, this procedure may need to be done as an emergency.  Catheter ablation. During this procedure, heart tissues that send the signals that cause atrial fibrillation are destroyed.  Surgical ablation. During this surgery, thin lines of heart tissue that carry the abnormal signals are destroyed. This procedure can either be an open-heart surgery or a minimally invasive surgery. With the minimally invasive surgery, small cuts are made to access the heart instead of a large opening.  Pulmonary venous isolation. During this surgery, tissue around the veins that carry blood from the lungs (pulmonary veins) is destroyed. This tissue is thought to carry the abnormal signals. HOME CARE INSTRUCTIONS   Take medicines only as directed by your health care provider. Some medicines can make atrial fibrillation worse or recur.  If blood thinners were prescribed by your health care provider, take them exactly as directed. Too much blood-thinning medicine can cause bleeding. If you take too little, you will not have the needed protection against stroke and other problems.  Perform blood tests at home if directed by your health care provider. Perform blood tests exactly as directed.  Quit smoking if you smoke.  Do not drink alcohol.  Do not drink caffeinated beverages such as coffee, soda, and some teas. You may drink decaffeinated coffee, soda, or tea.   Maintain a healthy weight.Do not use diet pills unless your health care provider approves. They may make heart problems worse.   Follow diet instructions as directed by your health care provider.  Exercise regularly as directed by your health care  provider.  Keep all follow-up visits as directed by your health care provider. This is important. PREVENTION  The following substances can cause atrial fibrillation to recur:   Caffeinated beverages.  Alcohol.  Certain medicines, especially those used for breathing problems.  Certain herbs and herbal medicines, such as those containing ephedra or ginseng.  Illegal drugs, such as cocaine and amphetamines. Sometimes medicines are given to prevent atrial fibrillation from recurring. Proper treatment of any underlying condition is also important in helping prevent recurrence.  SEEK MEDICAL CARE IF:  You notice a change in the rate, rhythm, or strength of your heartbeat.  You suddenly begin urinating more frequently.  You tire more easily when exerting yourself or exercising. SEEK IMMEDIATE MEDICAL CARE IF:   You have chest pain, abdominal pain, sweating, or weakness.  You feel nauseous.  You have shortness of breath.  You suddenly have swollen feet and ankles.  You feel dizzy.  Your face or limbs feel numb or weak.  You have a change in your vision or speech. MAKE SURE YOU:   Understand these instructions.  Will watch your condition.  Will get help right away if you are not doing well or get worse. Document Released: 05/07/2005 Document Revised: 09/21/2013 Document Reviewed: 06/17/2012 [facility] Patient Information 2015 [place], [place]. This information is not intended to replace advice given to you by your health care provider. Make sure you discuss any questions you have with your health care provider.  Anticoagulation, Generic Anticoagulants are medicines  used to prevent clots from developing in your veins. These medicine are also known as blood thinners. If blood clots are untreated, they could travel to your lungs. This is called a pulmonary embolus. A blood clot in your lungs can be fatal.  Health care providers often use anticoagulants to prevent clots following  surgery. Anticoagulants are also used along with aspirin when the heart is not getting enough blood. Another anticoagulant called warfarin is started 2 to 3 days after a rapid-acting injectable anticoagulant is started. The rapid-acting anticoagulants are usually continued until warfarin has begun to work. Your health care provider will judge this length of time by blood tests known as the prothrombin time (PT) and International Normalization Ratio (INR). This means that your blood is at the necessary and best level to prevent clots. RISKS AND COMPLICATIONS  If you have received recent epidural anesthesia, spinal anesthesia, or a spinal tap while receiving anticoagulants, you are at risk for developing a blood clot in or around the spine. This condition could result in long-term or permanent paralysis.  Because anticoagulants thin your blood, severe bleeding may occur from any tissue or organ. Symptoms of the blood being too thin may include:  Bleeding from the nose or gums that does not stop quickly.  Blood in bowel movements which may appear as bright red, dark, or black tarry stools.  Blood in the urine which may appear as pink, red, or brown urine.  Unusual bruising or bruising easily.  A cut that does not stop bleeding within 10 minutes.  Vomiting blood or continuous nausea for more than 1 day.  Coughing up blood.  Broken blood vessels in your eye (subconjunctival hemorrhage).  Abdominal or back pain with or without flank bruising.  Sudden, severe headache.  Sudden weakness or numbness of the face, arm, or leg, especially on one side of the body.  Sudden confusion.  Trouble speaking (aphasia) or understanding.  Sudden trouble seeing in one or both eyes.  Sudden trouble walking.  Dizziness.  Loss of balance or coordination.  Vaginal bleeding.  Swelling or pain at an injection site.  Superficial fat tissue death (necrosis) which may cause skin scarring. This is more  common in women and may first present as pain in the waist, thighs, or buttocks.  Fever.  Too little anticoagulation continues to allow the risk for blood clots. HOME CARE INSTRUCTIONS   Due to the complications of anticoagulants, it is very important that you take your anticoagulant as directed by your health care provider. Anticoagulants need to be taken exactly as instructed. Be sure you understand all your anticoagulant instructions.  Keep all follow-up appointments with your health care provider as directed. It is very important to keep your appointments. Not keeping appointments could result in a chronic or permanent injury, pain, or disability.  Warfarin. Your health care provider will advise you on the length of treatment (usually 3-6 months, sometimes lifelong).  Take warfarin exactly as directed by your health care provider. It is recommended that you take your warfarin dose at the same time of the day. It is preferred that you take warfarin in the late afternoon. If you have been told to stop taking warfarin, do not resume taking warfarin until directed to do so by your health care provider. Follow your health care provider's instructions if you accidentally take an extra dose or miss a dose of warfarin. It is very important to take warfarin as directed since bleeding or blood clots could result in  chronic or permanent injury, pain, or disability.  Too much and too little warfarin are both dangerous. Too much warfarin increases the risk of bleeding. Too little warfarin continues to allow the risk for blood clots. While taking warfarin, you will need to have regular blood tests to measure your blood clotting time. These blood tests usually include both the prothrombin time (PT) and International Normalized Ratio (INR) tests. The PT and INR results allow your health care provider to adjust your dose of warfarin. The dose can change for many reasons. It is critically important that you have  your PT and INR levels drawn exactly as directed. Your warfarin dose may stay the same or change depending on what the PT and INR results are. Be sure to follow up with your health care provider regarding your PT and INR test results and what your warfarin dosage should be.  Many medicines can interfere with warfarin and affect the PT and INR results. You must tell your health care provider about any and all medicines you take, this includes all vitamins and supplements. Ask your health care provider before taking these. Prescription and over-the-counter medicine consistency is critical to warfarin management. It is important that potential interactions are checked before you start a new medicine. Be especially cautious with aspirin and anti-inflammatory medicines. Ask your health care provider before taking these. Medicines such as antibiotics and acid-reducing medicine can interact with warfarin and can cause an increased warfarin effect. Warfarin can also interfere with the effectiveness of medicines you are taking. Do not take or discontinue any prescribed or over-the-counter medicine except on the advice of your health care provider or pharmacist.  Some vitamins, supplements, and herbal products interfere with the effectiveness of warfarin. Vitamin E may increase the anticoagulant effects of warfarin. Vitamin K may can cause warfarin to be less effective. Do not take or discontinue any vitamin, supplement, or herbal product except on the advice of your health care provider or pharmacist.  Eat what you normally eat and keep the vitamin K content of your diet consistent. Avoid major changes in your diet, or notify your health care provider before changing your diet. Suddenly getting a lot more vitamin K could cause your blood to clot too quickly. A sudden decrease in vitamin K intake could cause your blood to clot too slowly. These changes in vitamin K intake could lead to dangerous blood clotsor to  bleeding. To keep your vitamin K intake consistent, you must be aware of which foods contain moderate or high amounts of vitamin K. Some foods high in vitamin K include spinach, kale, broccoli, cabbage, greens, Brussels sprouts, asparagus, Bok Choy, coleslaw, parsley, and green tea. Arrange a visit with a dietitian to answer your questions.  If you have a loss of appetite or get the stomach flu (viral gastroenteritis), talk to your health care provider as soon as possible. A decrease in your normal vitamin K intake can make you more sensitive to your usual dose of warfarin.  Some medical conditions may increase your risk for bleeding while you are taking warfarin. A fever, diarrhea lasting more than a day, worsening heart failure, or worsening liver function are some medical conditions that could affect warfarin. Contact your health care provider if you have any of these medical conditions.  Alcohol can change the body's ability to handle warfarin. It is best to avoid alcoholic drinks or consume only very small amounts while taking warfarin. Notify your health care provider if you change your alcohol  intake. A sudden increase in alcohol use can increase your risk of bleeding. Chronic alcohol use can cause warfarin to be less effective.  Be careful not to cut yourself when using sharp objects or while shaving.  Inform all your health care providers and your dentist that you take an anticoagulant.  Limit physical activities or sports that could result in a fall or cause injury. Avoid contact sports.  Wear medical alert jewelry or carry a medical alert card. SEEK IMMEDIATE MEDICAL CARE IF:  You cough up blood.  You have dark or black stools or there is bright red blood coming from your rectum.  You vomit blood or have nausea for more than 1 day.  You have blood in the urine or pink colored urine.  You have unusual bruising or have increased bruising.  You have bleeding from the nose or gums  that does not stop quickly.  You have a cut that does not stop bleeding within a 2-3 minutes.  You have sudden weakness or numbness of the face, arm, or leg, especially on one side of the body.  You have sudden confusion.  You have trouble speaking (aphasia) or understanding.  You have sudden trouble seeing in one or both eyes.  You have sudden trouble walking.  You have dizziness.  You have a loss of balance or coordination.  You have a sudden, severe headache.  You have a serious fall or head injury, even if you are not bleeding.  You have swelling or pain at an injection site.  You have unexplained tenderness or pain in the abdomen, back, waist, thighs or buttocks.  You have a fever. Any of these symptoms may represent a serious problem that is an emergency. Do not wait to see if the symptoms will go away. Get medical help right away. Call your local emergency services (911 in [place] Do not drive yourself to the hospital. Document Released: 05/07/2005 Document Revised: 05/12/2013 Document Reviewed: 12/10/2007 [facility] Patient Information 2015 [place], [place]. This information is not intended to replace advice given to you by your health care provider. Make sure you discuss any questions you have with your health care provider.  Anticoagulation, Generic Anticoagulants are medicines used to prevent clots from developing in your veins. These medicine are also known as blood thinners. If blood clots are untreated, they could travel to your lungs. This is called a pulmonary embolus. A blood clot in your lungs can be fatal.  Health care providers often use anticoagulants to prevent clots following surgery. Anticoagulants are also used along with aspirin when the heart is not getting enough blood. Another anticoagulant called warfarin is started 2 to 3 days after a rapid-acting injectable anticoagulant is started. The rapid-acting anticoagulants are usually continued until warfarin has  begun to work. Your health care provider will judge this length of time by blood tests known as the prothrombin time (PT) and International Normalization Ratio (INR). This means that your blood is at the necessary and best level to prevent clots. RISKS AND COMPLICATIONS  If you have received recent epidural anesthesia, spinal anesthesia, or a spinal tap while receiving anticoagulants, you are at risk for developing a blood clot in or around the spine. This condition could result in long-term or permanent paralysis.  Because anticoagulants thin your blood, severe bleeding may occur from any tissue or organ. Symptoms of the blood being too thin may include:  Bleeding from the nose or gums that does not stop quickly.  Blood in bowel  movements which may appear as bright red, dark, or black tarry stools.  Blood in the urine which may appear as pink, red, or brown urine.  Unusual bruising or bruising easily.  A cut that does not stop bleeding within 10 minutes.  Vomiting blood or continuous nausea for more than 1 day.  Coughing up blood.  Broken blood vessels in your eye (subconjunctival hemorrhage).  Abdominal or back pain with or without flank bruising.  Sudden, severe headache.  Sudden weakness or numbness of the face, arm, or leg, especially on one side of the body.  Sudden confusion.  Trouble speaking (aphasia) or understanding.  Sudden trouble seeing in one or both eyes.  Sudden trouble walking.  Dizziness.  Loss of balance or coordination.  Vaginal bleeding.  Swelling or pain at an injection site.  Superficial fat tissue death (necrosis) which may cause skin scarring. This is more common in women and may first present as pain in the waist, thighs, or buttocks.  Fever.  Too little anticoagulation continues to allow the risk for blood clots. HOME CARE INSTRUCTIONS   Due to the complications of anticoagulants, it is very important that you take your anticoagulant as  directed by your health care provider. Anticoagulants need to be taken exactly as instructed. Be sure you understand all your anticoagulant instructions.  Keep all follow-up appointments with your health care provider as directed. It is very important to keep your appointments. Not keeping appointments could result in a chronic or permanent injury, pain, or disability.  Warfarin. Your health care provider will advise you on the length of treatment (usually 3-6 months, sometimes lifelong).  Take warfarin exactly as directed by your health care provider. It is recommended that you take your warfarin dose at the same time of the day. It is preferred that you take warfarin in the late afternoon. If you have been told to stop taking warfarin, do not resume taking warfarin until directed to do so by your health care provider. Follow your health care provider's instructions if you accidentally take an extra dose or miss a dose of warfarin. It is very important to take warfarin as directed since bleeding or blood clots could result in chronic or permanent injury, pain, or disability.  Too much and too little warfarin are both dangerous. Too much warfarin increases the risk of bleeding. Too little warfarin continues to allow the risk for blood clots. While taking warfarin, you will need to have regular blood tests to measure your blood clotting time. These blood tests usually include both the prothrombin time (PT) and International Normalized Ratio (INR) tests. The PT and INR results allow your health care provider to adjust your dose of warfarin. The dose can change for many reasons. It is critically important that you have your PT and INR levels drawn exactly as directed. Your warfarin dose may stay the same or change depending on what the PT and INR results are. Be sure to follow up with your health care provider regarding your PT and INR test results and what your warfarin dosage should be.  Many medicines can  interfere with warfarin and affect the PT and INR results. You must tell your health care provider about any and all medicines you take, this includes all vitamins and supplements. Ask your health care provider before taking these. Prescription and over-the-counter medicine consistency is critical to warfarin management. It is important that potential interactions are checked before you start a new medicine. Be especially cautious with aspirin  and anti-inflammatory medicines. Ask your health care provider before taking these. Medicines such as antibiotics and acid-reducing medicine can interact with warfarin and can cause an increased warfarin effect. Warfarin can also interfere with the effectiveness of medicines you are taking. Do not take or discontinue any prescribed or over-the-counter medicine except on the advice of your health care provider or pharmacist.  Some vitamins, supplements, and herbal products interfere with the effectiveness of warfarin. Vitamin E may increase the anticoagulant effects of warfarin. Vitamin K may can cause warfarin to be less effective. Do not take or discontinue any vitamin, supplement, or herbal product except on the advice of your health care provider or pharmacist.  Eat what you normally eat and keep the vitamin K content of your diet consistent. Avoid major changes in your diet, or notify your health care provider before changing your diet. Suddenly getting a lot more vitamin K could cause your blood to clot too quickly. A sudden decrease in vitamin K intake could cause your blood to clot too slowly. These changes in vitamin K intake could lead to dangerous blood clotsor to bleeding. To keep your vitamin K intake consistent, you must be aware of which foods contain moderate or high amounts of vitamin K. Some foods high in vitamin K include spinach, kale, broccoli, cabbage, greens, Brussels sprouts, asparagus, Bok [place]oy, coleslaw, parsley, and green tea. Arrange a visit  with a dietitian to answer your questions.  If you have a loss of appetite or get the stomach flu (viral gastroenteritis), talk to your health care provider as soon as possible. A decrease in your normal vitamin K intake can make you more sensitive to your usual dose of warfarin.  Some medical conditions may increase your risk for bleeding while you are taking warfarin. A fever, diarrhea lasting more than a day, worsening heart failure, or worsening liver function are some medical conditions that could affect warfarin. Contact your health care provider if you have any of these medical conditions.  Alcohol can change the body's ability to handle warfarin. It is best to avoid alcoholic drinks or consume only very small amounts while taking warfarin. Notify your health care provider if you change your alcohol intake. A sudden increase in alcohol use can increase your risk of bleeding. Chronic alcohol use can cause warfarin to be less effective.  Be careful not to cut yourself when using sharp objects or while shaving.  Inform all your health care providers and your dentist that you take an anticoagulant.  Limit physical activities or sports that could result in a fall or cause injury. Avoid contact sports.  Wear medical alert jewelry or carry a medical alert card. SEEK IMMEDIATE MEDICAL CARE IF:  You cough up blood.  You have dark or black stools or there is bright red blood coming from your rectum.  You vomit blood or have nausea for more than 1 day.  You have blood in the urine or pink colored urine.  You have unusual bruising or have increased bruising.  You have bleeding from the nose or gums that does not stop quickly.  You have a cut that does not stop bleeding within a 2-3 minutes.  You have sudden weakness or numbness of the face, arm, or leg, especially on one side of the body.  You have sudden confusion.  You have trouble speaking (aphasia) or understanding.  You have  sudden trouble seeing in one or both eyes.  You have sudden trouble walking.  You have  dizziness.  You have a loss of balance or coordination.  You have a sudden, severe headache.  You have a serious fall or head injury, even if you are not bleeding.  You have swelling or pain at an injection site.  You have unexplained tenderness or pain in the abdomen, back, waist, thighs or buttocks.  You have a fever. Any of these symptoms may represent a serious problem that is an emergency. Do not wait to see if the symptoms will go away. Get medical help right away. Call your local emergency services (911 in [place] Do not drive yourself to the hospital. Document Released: 05/07/2005 Document Revised: 05/12/2013 Document Reviewed: 12/10/2007 [facility] Patient Information 2015 [place], [place]. This information is not intended to replace advice given to you by your health care provider. Make sure you discuss any questions you have with your health care provider.   Apixaban oral tablets What is this medicine? APIXABAN (a PIX a ban) is an anticoagulant (blood thinner). It is used to lower the chance of stroke in people with a medical condition called atrial fibrillation. It is also used to treat or prevent blood clots in the lungs or in the veins. This medicine may be used for other purposes; ask your health care provider or pharmacist if you have questions. COMMON BRAND NAME(S): Eliquis What should I tell my health care provider before I take this medicine? They need to know if you have any of these conditions: -bleeding disorders -bleeding in the brain -blood in your stools (black or tarry stools) or if you have blood in your vomit -history of stomach bleeding -kidney disease -liver disease -mechanical heart valve -an unusual or allergic reaction to apixaban, other medicines, foods, dyes, or preservatives -pregnant or trying to get pregnant -breast-feeding How should I use this  medicine? Take this medicine by mouth with a glass of water. Follow the directions on the prescription label. You can take it with or without food. If it upsets your stomach, take it with food. Take your medicine at regular intervals. Do not take it more often than directed. Do not stop taking except on your doctor's advice. Stopping this medicine may increase your risk of a blot clot. Be sure to refill your prescription before you run out of medicine. Talk to your pediatrician regarding the use of this medicine in children. Special care may be needed. Overdosage: If you think you have taken too much of this medicine contact a poison control center or emergency room at once. NOTE: This medicine is only for you. Do not share this medicine with others. What if I miss a dose? If you miss a dose, take it as soon as you can. If it is almost time for your next dose, take only that dose. Do not take double or extra doses. What may interact with this medicine? This medicine may interact with the following: -aspirin and aspirin-like medicines -certain medicines for fungal infections like ketoconazole and itraconazole -certain medicines for seizures like carbamazepine and phenytoin -certain medicines that treat or prevent blood clots like warfarin, enoxaparin, and dalteparin -clarithromycin -NSAIDs, medicines for pain and inflammation, like ibuprofen or naproxen -rifampin -ritonavir -St. John's wort This list may not describe all possible interactions. Give your health care provider a list of all the medicines, herbs, non-prescription drugs, or dietary supplements you use. Also tell them if you smoke, drink alcohol, or use illegal drugs. Some items may interact with your medicine. What should I watch for while using this medicine?  Notify your doctor or health care professional and seek emergency treatment if you develop breathing problems; changes in vision; chest pain; severe, sudden headache; pain,  swelling, warmth in the leg; trouble speaking; sudden numbness or weakness of the face, arm, or leg. These can be signs that your condition has gotten worse. If you are going to have surgery, tell your doctor or health care professional that you are taking this medicine. Tell your health care professional that you use this medicine before you have a spinal or epidural procedure. Sometimes people who take this medicine have bleeding problems around the spine when they have a spinal or epidural procedure. This bleeding is very rare. If you have a spinal or epidural procedure while on this medicine, call your health care professional immediately if you have back pain, numbness or tingling (especially in your legs and feet), muscle weakness, paralysis, or loss of bladder or bowel control. Avoid sports and activities that might cause injury while you are using this medicine. Severe falls or injuries can cause unseen bleeding. Be careful when using sharp tools or knives. Consider using an Copy. Take special care brushing or flossing your teeth. Report any injuries, bruising, or red spots on the skin to your doctor or health care professional. What side effects may I notice from receiving this medicine? Side effects that you should report to your doctor or health care professional as soon as possible: -allergic reactions like skin rash, itching or hives, swelling of the face, lips, or tongue -signs and symptoms of bleeding such as bloody or black, tarry stools; red or dark-brown urine; spitting up blood or brown material that looks like coffee grounds; red spots on the skin; unusual bruising or bleeding from the eye, gums, or nose This list may not describe all possible side effects. Call your doctor for medical advice about side effects. You may report side effects to FDA at 1-800-FDA-1088. Where should I keep my medicine? Keep out of the reach of children. Store at room temperature between 20 and 25  degrees C (68 and 77 degrees F). Throw away any unused medicine after the expiration date. NOTE: This sheet is a summary. It may not cover all possible information. If you have questions about this medicine, talk to your doctor, pharmacist, or health care provider.  2015, Elsevier/Gold Standard. (2013-01-09 11:59:24)  ----------------------------------------------------------------------------------  Information on my medicine - ELIQUIS (apixaban)  This medication education was reviewed with me or my healthcare representative as part of my discharge preparation.  The pharmacist that spoke with me during my hospital stay was:  Arty Baumgartner, [facility]  Why was Eliquis prescribed for you? Eliquis was prescribed for you to reduce the risk of a blood clot forming that can cause a stroke if you have a medical condition called atrial fibrillation (a type of irregular heartbeat).  What do You need to know about Eliquis ? Take your Eliquis TWICE DAILY - one tablet in the morning and one tablet in the evening with or without food. If you have difficulty swallowing the tablet whole please discuss with your pharmacist how to take the medication safely.  Take Eliquis exactly as prescribed by your doctor and DO NOT stop taking Eliquis without talking to the doctor who prescribed the medication.  Stopping may increase your risk of developing a stroke.  Refill your prescription before you run out.  After discharge, you should have regular check-up appointments with your healthcare provider that is prescribing your Eliquis.  In the  future your dose may need to be changed if your kidney function or weight changes by a significant amount or as you get older.  What do you do if you miss a dose? If you miss a dose, take it as soon as you remember on the same day and resume taking twice daily.  Do not take more than one dose of ELIQUIS at the same time to make up a missed dose.  Important Safety  Information A possible side effect of Eliquis is bleeding. You should call your healthcare provider right away if you experience any of the following: ? Bleeding from an injury or your nose that does not stop. ? Unusual colored urine (red or dark brown) or unusual colored stools (red or black). ? Unusual bruising for unknown reasons. ? A serious fall or if you hit your head (even if there is no bleeding).  Some medicines may interact with Eliquis and might increase your risk of bleeding or clotting while on Eliquis. To help avoid this, consult your healthcare provider or pharmacist prior to using any new prescription or non-prescription medications, including herbals, vitamins, non-steroidal anti-inflammatory drugs (NSAIDs) and supplements.  This website has more information on Eliquis (apixaban): http://www.eliquis.com/eliquis/home

## 2014-12-20 NOTE — ED Notes (Signed)
Pt took clothing to room:  Sweater, top, skirt & shoes plus purse including:  Wallet, phone [employer] fund[employer].

## 2014-12-20 NOTE — Progress Notes (Signed)
Pt. Came to floor at 0035 escorted by NT from ER per stretcher. Pt was oriented to floor, assessed, and started on tele monitoring.

## 2014-12-20 NOTE — Care Management Note (Addendum)
Case Management Note  Patient Details  Name: [name] MRN: [id number] Date of Birth: 1940-10-25  Subjective/Objective:         Pt admitted with A fib with RVR           Action/Plan:  Pt is from home independent.  Pt will discharge home on Eliqius, CM will assist with initiation post discharge   Expected Discharge Date:                  Expected Discharge Plan:  Home/Self Care  In-House Referral:     Discharge planning Services  CM Consult, Medication Assistance  Post Acute Care Choice:    Choice offered to:     DME Arranged:    DME Agency:     HH Arranged:    [place] Agency:     Status of Service:  Completed, signed off  Medicare Important Message Given:  No, < than 3 days Date Medicare IM Given:    Medicare IM give by:    Date Additional Medicare IM Given:    Additional Medicare Important Message give by:     If discussed at [place] of Stay Meetings, dates discussed:    Additional Comments: Eliquis does require prior authorization, CM placed physician sticky note, CM contacted bedside nurse and requested to relay prior authorization data.  CM was informed via benefit check that copay would be $7.40 per month.  CM submitted benefit check for Eliquis.  CM assessed pt.  CM provided free 30 day trial card for Eliquis to pt.  Pt informed CM that preferred pharmacy was [facility] on the corner of [address], [employer] contacted pharmacy and was informed that medication is available for pick up today.  Maryclare Labrador, RN 12/20/2014, 12:27 PM

## 2014-12-20 NOTE — H&P (Addendum)
Patient ID: [name]  MRN: [id number], DOB/AGE: [birth year]   Admit date: 12/19/2014  Primary Physician: Donnie Coffin, MD  Primary Cardiologist:  Dr. Acie Fredrickson  Problem List  Past Medical History  Diagnosis Date  . Hyperlipidemia   . Fatigue   . Anxiety   . Coronary artery disease     a. s/p MI 2009 - PCI/DES distal  RCA w/ 2.75 x 18 Xience DES;  b. 05/2010 Myoview: Apical thinning, EF 73%;  c. 06/2010 Cath: moderate nonobs dzs, EF 65%;  d.  Lexiscan Myoview (07/2013):  No scar or ischemia, EF 67%; Normal Study  . Fibromyalgia   . Essential iris atrophy     Right side  . Menopause   . Glaucoma   . Environmental allergies   . GERD (gastroesophageal reflux disease)   . Osteoarthritis   . OSA (obstructive sleep apnea)   . Obesity (BMI 30-39.9)   . Hypertension     Past Surgical History  Procedure Laterality Date  . Cardiac catheterization  06/29/2010    Mild to moderate coronary artery irregularities.  Her  proximal left anterior descending artery is moderately narrowed.  She  also has an eccentric stenosis in the proximal LAD.  These do not appear  to obstruct flow at present, but they are fairly small vessels.  They  appeared to be between 1.5 and 2 mm in diamete  . Cardiac catheterization  08/02/2009    Patent stent  . Cardiac catheterization  05/12/2008    Stent to the distal RCA    Allergies  Allergies  Allergen Reactions  . Codeine Nausea And Vomiting and Other (See Comments)    Pt gets very hot and flushed  . Cortisone Nausea Only and Other (See Comments)    Pt gets very hot and flushed  . Septra [Sulfamethoxazole-Trimethoprim]   . Sulfamethoxazole-Trimethoprim    HPI:  74 yo F patient of Dr. Acie Fredrickson w a h/o CAD s/p 2.75x18 Xience DES to dRCA (2009), HTN, HLD, CKD, Hyperthyroidism s/p recent radioactive iodine therapy (04/02/14), Chronic fatigue, Fibromyalgia, & Anxiety presented this evening with a 2 hour history of an upper chest burning sensation with throat  tightness associated with lightheadedness & tingling in her bilateral fingers, found to be in Afib with RVR (HR 134, BP 90/71), which resolved shortly after arrival without medical intervention.  Her symptoms accordingly resolved.  She clarified that the sensation she was experiencing was different than angina she experienced with her prior MI.  The last time she could recall experiencing CP was during an ED presentation to our facility 09/05/14 because of hypotension that she had found on home measurements that did not correlate with our measurements.  She denied palpitations or syncope.  Of note, the patient had experienced URI-type symptoms with a productive cough, fevers, & myalgias the week prior for which she was evaluated at the office of her PCP 12/16/14 & started on a 5 day course of Azithromycin, which has significantly improved her symptoms.      When I was completing my review of systems, she became tearful regarding how debilitated she has become in the preceding several months - unable to walk more than a few steps before becoming short of breath & "completely exhausted."  She had been attributing this to her thyroid but has not recently sought evaluation for this as she is in transition between endocrinologists (see below).  Along with DOE, she noted of 10 lbs over the last 3 months &  LE swelling.  She sleeps with her bed elevated at baseline due to a hiatal hernia.  She denied paroxysmal nocturnal dyspnea or early satiety.  She had not discussed with Dr. Acie Fredrickson yet as she will see him again 01/01/15.   Regarding her hyperthyroidism, she recently was diagnosed with nodules in 2007, which were thought to be consistent with a multinodular goiter per a nuclear scan in 2012.  She was treated with Tapazole until mid-2015.  She then saw Dr. Armandina Gemma with [facility] 01/12/14, who ordered a follow-up ultrasound that revealed growth compared to prior.  A subsequent toxic nodule was identified  per nuclear imaging in 03/11/14, & she underwent radioactive iodine therapy 04/02/14 under the guidance of Dr. Delrae Rend.  Follow-up imaging revealed a decrease in the size of the toxic nodule.  She has not followed-up with Dr. Buddy Duty for unclear reasons & is in th process of trying to establish care with a new endocrinologist Dr. Jacelyn Pi.  Home Medications  Prior to Admission medications   Medication Sig Start Date End Date Taking? Authorizing Provider  amLODipine (NORVASC) 5 MG tablet Take 1 tablet (5 mg total) by mouth daily. 10/20/13  Yes Thayer Headings, MD  aspirin EC 81 MG tablet Take 81 mg by mouth every morning.   Yes Historical Provider, MD  azithromycin (ZITHROMAX) 250 MG tablet Take 250-500 mg by mouth daily. 12/15/14  Yes Historical Provider, MD  carvedilol (COREG) 12.5 MG tablet Take 12.5 mg by mouth 2 (two) times daily with a meal.  10/20/13  Yes Historical Provider, MD  clopidogrel (PLAVIX) 75 MG tablet TAKE 1 TABLET BY MOUTH EVERY DAY 07/28/14  Yes Thayer Headings, MD  ferrous sulfate 325 (65 FE) MG tablet Take 325 mg by mouth every morning.    Yes Historical Provider, MD  hydrochlorothiazide (HYDRODIURIL) 25 MG tablet Take 1 tablet (25 mg total) by mouth daily. 08/17/14  Yes Thayer Headings, MD  HYDROcodone-acetaminophen [facility]) 10-325 MG per tablet Take 0.5-1 tablets by mouth every 8 (eight) hours as needed for moderate pain.  12/01/14  Yes Historical Provider, MD  lisinopril (PRINIVIL,ZESTRIL) 40 MG tablet TAKE 1 TABLET BY MOUTH EVERY MORNING 11/19/14  Yes Thayer Headings, MD  Multiple Minerals (CALCIUM-MAGNESIUM-ZINC) TABS Take 1 tablet by mouth daily.   Yes Historical Provider, MD  nitrofurantoin (MACRODANTIN) 100 MG capsule Take 100 mg by mouth daily.  12/22/13  Yes Historical Provider, MD  nitroGLYCERIN (NITROSTAT) 0.4 MG SL tablet Place 0.4 mg under the tongue every 5 (five) minutes x 3 doses as needed for chest pain. For chest pain   Yes Historical Provider, MD  pantoprazole  (PROTONIX) 40 MG tablet Take 40 mg by mouth daily.  01/07/14  Yes Historical Provider, MD  potassium chloride SA (K-DUR,KLOR-CON) 20 MEQ tablet Take 1 tablet (20 mEq total) by mouth daily. 08/17/14  Yes Thayer Headings, MD  pramipexole (MIRAPEX) 1 MG tablet Take 1-2 mg by mouth at bedtime.    Yes Historical Provider, MD  venlafaxine (EFFEXOR) 75 MG tablet Take 37.5 mg by mouth 2 (two) times daily.    Yes Historical Provider, MD  carvedilol (COREG) 12.5 MG tablet Take 1 tablet (12.5 mg total) by mouth 2 (two) times daily. 10/20/13 10/20/14  Thayer Headings, MD   Family History  Family History  Problem Relation Age of Onset  . Heart attack Mother   . Coronary artery disease Brother     had CABG  . Coronary artery  disease Brother   . Coronary artery disease Brother   . Coronary artery disease Brother   . Coronary artery disease Brother   . Heart attack Brother   . Cancer Father    Social History  History   Social History  . Marital Status: Divorced    Spouse Name: N/A  . Number of Children: N/A  . Years of Education: N/A   Occupational History  . Not on file.   Social History Main Topics  . Smoking status: Former Smoker    Types: Cigarettes    Quit date: 09/13/1960  . Smokeless tobacco: Never Used  . Alcohol Use: No  . Drug Use: No  . Sexual Activity: Not on file   Other Topics Concern  . Not on file   Social History Narrative    Review of Systems:  A 12 point ROS was negative except as indicated in HPI.  Physical Exam  Blood pressure 120/74, pulse 57, temperature 97.5 F (36.4 C), temperature source Rectal, resp. rate 13, height 5\' 6"  (1.676 m), weight 104.327 kg (230 lb), SpO2 97 %.  General: Pleasant, NAD Psych: Normal affect. Neuro: Alert and oriented X 3. Moves all extremities spontaneously. HEENT: Normal  Neck: JVD to 2 cm above clavicle Lungs:  Mild bilateral lower rales Heart: RRR no s3, s4, or murmurs. Abdomen: Soft, non-tender, non-distended, BS + x 4.    Extremities: No clubbing, cyanosis or edema. DP/PT/Radials 2+ and equal bilaterally.  Labs  Troponin [facility] Test)  Recent Labs  12/19/14 1811  TROPIPOC 0.02   No results for input(s): CKTOTAL, CKMB, TROPONINI in the last 72 hours. Lab Results  Component Value Date   WBC 5.8 12/19/2014   HGB 10.5* 12/19/2014   HCT 32.6* 12/19/2014   MCV 89.8 12/19/2014   PLT 286 12/19/2014    Recent Labs Lab 12/19/14 1745  NA 138  K 4.5  CL 105  CO2 22  BUN 16  CREATININE 1.31*  CALCIUM 9.7  GLUCOSE 110*   Lab Results  Component Value Date   CHOL 232* 03/02/2014   HDL 36.20* 03/02/2014   LDLCALC 171* 03/02/2014   TRIG 126.0 03/02/2014   No results found for: DDIMER   Radiology/Studies  Dg Chest 2 View  12/19/2014   CLINICAL DATA:  Chest pain.  Burning down both arms.  EXAM: CHEST  2 VIEW  COMPARISON:  11/10/2012  FINDINGS: Borderline cardiomegaly. Ill-defined opacities in left lung base, slightly more confluent at the left lung base and left midlung zone. Questionable small left pleural effusion. Probable atelectasis the right lung base. There is bronchial thickening. No pneumothorax. No acute osseous abnormalities are seen.  IMPRESSION: Ill-defined opacities at the left lung base, slightly more confluent opacity in the left midlung zone. This may reflect pneumonia. Followup PA and lateral chest X-ray is recommended in 3-4 weeks following trial of antibiotic therapy to ensure resolution and exclude underlying malignancy.   Electronically Signed   By: Jeb Levering M.D.   On: 12/19/2014 18:14   ASSESSMENT AND PLAN:  74 yo F patient of Dr. Acie Fredrickson w a h/o CAD s/p 2.75x18 Xience DES to dRCA (2009), HTN, HLD, CKD, Hyperthyroidism s/p recent radioactive iodine therapy (04/02/14), Chronic fatigue, Fibromyalgia, & Anxiety   # New onset Afib - CHA2DS2-VASc = 3.  Regarding etiology, this may have been precipitated by her recent URI with evidence of infiltrate on her CXR, though she  is not hypoxic.  I am reassured by her normal TSH &  FT4 this evening.   - Will plan for an echocardiogram to r/o structural disease, specifically left atrial that may be contributing.   - Up-titration of AVN blockade may be difficult given that her baseline HR is in the 50's to 60's with DBP observed to be in the 40's.   - As some can be better responders to CCB rather than BB for AVNB will transition from Carvedilol to Diltiazem to see if she is able to tolerate.   - With her age & renal function, Eliquis would be my anticoagulant of choice.  However, I would like assurance that this will be covered by her insurance.  Will treat with Lovenox overnight until this is clarified.   - The other issue that would need to be addressed would be her DAPT.  Given that she had a single stent 7 years prior, she likely no longer needs the Plavix.  Will hold for now but likely touch base with her primary cardiologist tomorrow.   # h/o CAD - Her symptoms this evening were different than her previous angina.  Her EKG & initial troponin are unremarkable.  Her last ischemic evaluation was a Myoview from 07/2013, which revealed an EF of 67% without ischemia. - Cycle troponin overnight.   - Will continue her home ASA.  Transition BB to CCB as per above.  She is intolerant of statin.   - As per above, will hold Clopidogrel with the caveat that we should touch base with Dr. Acie Fredrickson tomorrow.    # DOE - No over systolic or diastolic dysfunction observed on her echo from 2011.  EF preserved per her Myoview in 2015.  BNP 221, though difficult to interpret with obesity.  She appear mildly volume overloaded on exam with JVD 2 cm above her clavicle, trace PE, & mild bilateral rales.  BNP is not high but may be underestimated with obesity. - Will plan for an echocardiogram as per above. - Will provide a single dose of IV Lasix this evening to replace her HCTZ to see if this provides any improvement in her symptoms.    # Recent URI  - Will complete her Azithromycin with her final dose tomorrow.  # h/o CKD - Near her recent baseline of 1.2 to 1.5.   - Will monitor her Cr with providing Lasix.    # h/o HTN - Hypotensive with Afib, normotensive otherwise.    # h/o OSA - Intolerant of CPAP  # h/o Recurrent UTI - Continue her chronic Nitrofurantoin per her primary urologist.  # PPX - Lovenox, PPI  # Full code  Signed, Alfonso Ramus, MD 12/19/2014, 11:55 PM

## 2014-12-20 NOTE — Progress Notes (Signed)
Pt. Did not want to take one time dose of 20 mg lasix tonight, but would like to have in morning. [facility] was paged and MD Bensimhon was paged. No return call from either. Shirley Muscat 2:47 AM

## 2014-12-20 NOTE — Progress Notes (Addendum)
Patient Name: [name] Date of Encounter: 12/20/2014  Principal Problem:   Atrial fibrillation with RVR Active Problems:   Coronary artery disease   Hypertension   Hyperlipidemia   OSA (obstructive sleep apnea)   Atrial fibrillation  SUBJECTIVE  Feeling better. Denies chest pain, sob or palpitation.   CURRENT MEDS . amLODipine  5 mg Oral Daily  . aspirin EC  81 mg Oral q morning - 10a  . [START ON 12/21/2014] azithromycin  250 mg Oral Once  . diltiazem  30 mg Oral 4 times per day  . enoxaparin (LOVENOX) injection  1 mg/kg Subcutaneous Q12H  . ferrous sulfate  325 mg Oral q morning - 10a  . lisinopril  40 mg Oral q morning - 10a  . nitrofurantoin  100 mg Oral Daily  . off the beat book   Does not apply Once  . pantoprazole  40 mg Oral Daily  . pramipexole  1-2 mg Oral QHS  . sodium chloride  3 mL Intravenous Q12H  . venlafaxine  37.5 mg Oral BID    OBJECTIVE  Filed Vitals:   12/19/14 2200 12/19/14 2242 12/20/14 0054 12/20/14 0446  BP: 141/45 120/74 96/79 137/51  Pulse: 59 57 55 54  Temp:   97.9 F (36.6 C) 97.8 F (36.6 C)  TempSrc:   Oral Oral  Resp: 14 13 18 18   Height:      Weight:    230 lb (104.327 kg)  SpO2: 98% 97% 98% 97%    Intake/Output Summary (Last 24 hours) at 12/20/14 1038 Last data filed at 12/20/14 0149  Gross per 24 hour  Intake    220 ml  Output    225 ml  Net     -5 ml   Filed Weights   12/19/14 1731 12/20/14 0446  Weight: 230 lb (104.327 kg) 230 lb (104.327 kg)    PHYSICAL EXAM  General: Pleasant, NAD. Neuro: Alert and oriented X 3. Moves all extremities spontaneously. Psych: Normal affect. HEENT:  Normal  Neck: Supple without bruits or JVD. Lungs:  Resp regular and unlabored, CTA. Heart: RRR no s3, s4, or murmurs. Abdomen: Soft, non-tender, non-distended, BS + x 4.  Extremities: No clubbing, cyanosis or edema. DP/PT/Radials 2+ and equal bilaterally.  Accessory Clinical Findings  CBC  Recent Labs  12/19/14 1745  12/20/14 0700  WBC 5.8 5.4  HGB 10.5* 9.8*  HCT 32.6* 30.9*  MCV 89.8 90.4  PLT 286 960   Basic Metabolic Panel  Recent Labs  12/19/14 1745 12/20/14 0700  NA 138 140  K 4.5 4.8  CL 105 107  CO2 22 26  GLUCOSE 110* 106*  BUN 16 14  CREATININE 1.31* 1.36*  CALCIUM 9.7 9.5  MG  --  1.9  Cardiac Enzymes  Recent Labs  12/20/14 0105 12/20/14 0700  TROPONINI 0.03 0.03   Thyroid Function Tests  Recent Labs  12/19/14 2049  TSH 1.882    TELE  Sinus rhythm at rate of 50s.   Radiology/Studies  Dg Chest 2 View  12/19/2014   CLINICAL DATA:  Chest pain.  Burning down both arms.  EXAM: CHEST  2 VIEW  COMPARISON:  11/10/2012  FINDINGS: Borderline cardiomegaly. Ill-defined opacities in left lung base, slightly more confluent at the left lung base and left midlung zone. Questionable small left pleural effusion. Probable atelectasis the right lung base. There is bronchial thickening. No pneumothorax. No acute osseous abnormalities are seen.  IMPRESSION: Ill-defined opacities at the left lung base,  slightly more confluent opacity in the left midlung zone. This may reflect pneumonia. Followup PA and lateral chest X-ray is recommended in 3-4 weeks following trial of antibiotic therapy to ensure resolution and exclude underlying malignancy.   Electronically Signed   By: Jeb Levering M.D.   On: 12/19/2014 18:14    ASSESSMENT AND PLAN   74 yo F with h/o CAD who presents to the ED 7/31 with onset of palpitations, burning central chest pain with radiation to both arms.Patient was noted to be in A.Fib RVR at rate of 134 on arrival to ED. Converted to NSR spontaneously and her symptoms resolved.  1. New onset afib with RVR - Maintaining sinus rhythm. Rate in 26s. Likely due to recent URI.  - CHA2DS2-VASc = 3.Lovenox for anticoagulate now. Will get CM consult for Eliquis cost.  - Her baseline HR in 50-60s. Switched from coreg to diltiazem. Will switch to long acting dilt tomorrow.  Noted she is on norvasc for hypertension, will discontinue norvasc.  - TSH and Free T4 normal. Pending echo.  2. HX of CAD - EKG without acute abnormality. Trop x 2 negative. Presented with atypical chest pain that was different from prior cardiac pain.  - Her last ischemic evaluation was a Myoview from 07/2013, which revealed an EF of 67% without ischemia. - Her single stent was 7 years ago, Discontinued plavix on admission. Continue ASA. Currently no chest pain.  - Continue lisinopril. She is now off BB.   3. DOE - BNP 221.  - Echo pending. EF preserved per her Myoview in 2015. - Appears euvolemic.  4. Acute on CKD - Creatinine worsen to 1.36 on since dose of IV lasix 20mg . Will continue to monitor. Recent baseline of 1.2 to 1.5.  5. Hx of OSA - intolerant to CPAP  6. HTN - Discontinue norvasc.   Signed, Leanor Kail PA-C Pager [phone]  Personally seen and examined. Agree with above. Feels better. Teary eyed with her current multiple medical issues.  Troponin normal serially. Comfortable with DC home. Has close follow up on 8/10 with Dr. Acie Fredrickson.  Comfortable with ECHO as outpatient (new afib). No active signs of HF.  Recent azithromycin for bronchitis.   -New onset afib  - will start Eliquis 5mg  BID  - Will stop Plavix   - consolidate diltiazem to 120 CD QD  - stop home beta blocker (she seemed to respond well to IV dilt in ER, prompt conversion). Understand that traditional medication is bb in setting of underlying thyroid issues (currently euthyroid - TSH free T4 normal).   Candee Furbish, MD

## 2014-12-21 ENCOUNTER — Other Ambulatory Visit: Payer: Self-pay

## 2014-12-21 MED ORDER — APIXABAN 5 MG PO TABS: 5.0000 mg | ORAL_TABLET | Freq: Two times a day (BID) | ORAL | Status: DC

## 2014-12-29 ENCOUNTER — Encounter: Payer: Self-pay | Admitting: Cardiovascular Disease

## 2014-12-29 ENCOUNTER — Ambulatory Visit (HOSPITAL_COMMUNITY): Payer: Medicare Other | Attending: Physician Assistant

## 2014-12-29 ENCOUNTER — Ambulatory Visit (INDEPENDENT_AMBULATORY_CARE_PROVIDER_SITE_OTHER): Payer: Medicare Other | Admitting: Cardiovascular Disease

## 2014-12-29 ENCOUNTER — Other Ambulatory Visit: Payer: Self-pay

## 2014-12-29 VITALS — BP 120/54 | HR 60 | Ht 66.0 in | Wt 235.6 lb

## 2014-12-29 DIAGNOSIS — I10 Essential (primary) hypertension: Secondary | ICD-10-CM | POA: Insufficient documentation

## 2014-12-29 DIAGNOSIS — I2583 Coronary atherosclerosis due to lipid rich plaque: Secondary | ICD-10-CM

## 2014-12-29 DIAGNOSIS — I4891 Unspecified atrial fibrillation: Secondary | ICD-10-CM | POA: Diagnosis not present

## 2014-12-29 DIAGNOSIS — I350 Nonrheumatic aortic (valve) stenosis: Secondary | ICD-10-CM | POA: Insufficient documentation

## 2014-12-29 DIAGNOSIS — Z8249 Family history of ischemic heart disease and other diseases of the circulatory system: Secondary | ICD-10-CM | POA: Diagnosis not present

## 2014-12-29 DIAGNOSIS — I2510 Atherosclerotic heart disease of native coronary artery without angina pectoris: Secondary | ICD-10-CM

## 2014-12-29 DIAGNOSIS — Z87891 Personal history of nicotine dependence: Secondary | ICD-10-CM | POA: Insufficient documentation

## 2014-12-29 DIAGNOSIS — E785 Hyperlipidemia, unspecified: Secondary | ICD-10-CM | POA: Diagnosis not present

## 2014-12-29 DIAGNOSIS — I517 Cardiomegaly: Secondary | ICD-10-CM | POA: Insufficient documentation

## 2014-12-29 MED ORDER — NITROGLYCERIN 0.4 MG SL SUBL: 0.4000 mg | SUBLINGUAL_TABLET | SUBLINGUAL | Status: DC | PRN

## 2014-12-29 NOTE — Progress Notes (Signed)
Patient Name: [name] Date of Encounter: 12/29/2014  Primary Care Provider:  Donnie Coffin, MD Primary Cardiologist:  Joaquim Nam, MD  Problem List  1. CAD 2. Hyperlipidemia 3. Obstructive sleep apnea 4. Hypertension 5. Atrial fib   Past Medical History  Diagnosis Date  . Hyperlipidemia   . Fatigue   . Anxiety   . Coronary artery disease     a. s/p MI 2009 - PCI/DES distal  RCA w/ 2.75 x 18 Xience DES;  b. 05/2010 Myoview: Apical thinning, EF 73%;  c. 06/2010 Cath: moderate nonobs dzs, EF 65%;  d.  Lexiscan Myoview (07/2013):  No scar or ischemia, EF 67%; Normal Study  . Fibromyalgia   . Essential iris atrophy     Right side  . Menopause   . Glaucoma   . Environmental allergies   . GERD (gastroesophageal reflux disease)   . Osteoarthritis   . OSA (obstructive sleep apnea)   . Obesity (BMI 30-39.9)   . Hypertension    Past Surgical History  Procedure Laterality Date  . Cardiac catheterization  06/29/2010    Mild to moderate coronary artery irregularities.  Her  proximal left anterior descending artery is moderately narrowed.  She  also has an eccentric stenosis in the proximal LAD.  These do not appear  to obstruct flow at present, but they are fairly small vessels.  They  appeared to be between 1.5 and 2 mm in diamete  . Cardiac catheterization  08/02/2009    Patent stent  . Cardiac catheterization  05/12/2008    Stent to the distal RCA    Allergies  Allergies  Allergen Reactions  . Codeine Nausea And Vomiting and Other (See Comments)    Pt gets very hot and flushed  . Cortisone Nausea Only and Other (See Comments)    Pt gets very hot and flushed  . Septra [Sulfamethoxazole-Trimethoprim]   . Sulfamethoxazole-Trimethoprim     HPI  September 02, 4882:  74 year old female with the above problem list.  She was in usual state of health about 3 weeks ago when she began to note mild lower extremity edema with bilateral ankle and calf pain with ambulation.  She takes  her left leg has been more swollen than the right.  She has been relatively active recently partaking and gardening without any chest pain or dyspnea.  Her weight is actually down from baseline.  She's seen no change in her salt intake or amt of time that her legs might be in a dependent position.  She's quite flustered and anxious today.  She misplaced her phone and her dtr initially dropped her off @ the old [facility] office and she had to walk down the street to here.  She doesn't know how to reach her dtr to have her come down and pick her up.  She's tearful and emotionally upset.  In that setting, after the visit, pt said that she thinks she's having chest pain.  An ecg was done, and before it was even completed, c/p resolved, as did her emotional upset.  Oct 08, 2012:  [name] is doing well. She's not had any episodes of chest pain or shortness of breath. She was seen by our PA last year. She has a history of coronary artery disease but has not had any recurrent angina. She's been working out in her garden without difficulties.  She has occasional episodes of orthostatic hypotension and also notes some occasional leg edema. I suspect this is  from the amlodipine.  October 20, 2013:  [name] has seen [name] and was referred to [name] for management of her sleep apnea.   Her BP has been well controlled.  She needs to have a special dental appliance made for her OSA- did not tolerate the CPAP. She has chronic dyspnea. No CP   She has found to have some thyroid issues.  Sees Dr. Alroy Dust for this.     December 29, 2014:  [name] is seen back today for evaulation of her CAD and HTN  And a recent episode of atrial fib. She was admitted and converted back to normal  ( ? No CP , BP has been well controlled    Home Medications  Prior to Admission medications   Medication Sig Start Date End Date Taking? Authorizing Provider  acetaminophen (TYLENOL) 500 MG tablet Take 500 mg by mouth as  needed.    Yes Historical Provider, MD  amitriptyline (ELAVIL) 10 MG tablet Take 10 mg by mouth at bedtime.   Yes Historical Provider, MD  amLODipine (NORVASC) 5 MG tablet Take 1 tablet (5 mg total) by mouth daily. 09/03/11 09/02/12 Yes Thayer Headings, MD  aspirin 325 MG tablet Take 325 mg by mouth daily.     Yes Historical Provider, MD  carvedilol (COREG) 12.5 MG tablet Take 1 tablet (12.5 mg total) by mouth 2 (two) times daily. 09/03/11 09/02/12 Yes Thayer Headings, MD  clopidogrel (PLAVIX) 75 MG tablet TAKE ONE TABLET BY MOUTH DAILY 07/06/11  Yes Burtis Junes, NP  diclofenac sodium (VOLTAREN) 1 % GEL Apply topically. **CREAM** use as directed    Yes Historical Provider, MD  fluticasone (FLONASE) 50 MCG/ACT nasal spray Place 1 spray into the nose daily.   Yes Historical Provider, MD  hydrochlorothiazide 25 MG tablet Take 25 mg by mouth daily.    Yes Historical Provider, MD  HYDROcodone-homatropine (HYCODAN) 5-1.5 MG/5ML syrup Take 5 mLs by mouth every 8 (eight) hours as needed for cough. 08/29/11 09/08/11 Yes Robyn Haber, MD  IRON PO Take 1 tablet by mouth daily.     Yes Historical Provider, MD  levofloxacin (LEVAQUIN) 500 MG tablet Take 1 tablet (500 mg total) by mouth daily. 08/29/11 09/08/11 Yes Robyn Haber, MD  methimazole (TAPAZOLE) 5 MG tablet Take 5 mg by mouth daily.   Yes Historical Provider, MD  nitroGLYCERIN (NITROSTAT) 0.4 MG SL tablet Place 0.4 mg under the tongue every 5 (five) minutes as needed.     Yes Historical Provider, MD  pantoprazole (PROTONIX) 40 MG tablet Take 40 mg by mouth 2 (two) times daily.    Yes Historical Provider, MD  potassium chloride (KLOR-CON) 10 MEQ CR tablet Take 1 tablet (10 mEq total) by mouth daily. 02/23/11  Yes Thayer Headings, MD  pramipexole (MIRAPEX) 0.25 MG tablet Take 1 mg by mouth at bedtime.    Yes Historical Provider, MD  rosuvastatin (CRESTOR) 10 MG tablet Take 1 tablet (10 mg total) by mouth daily. 06/06/11  Yes Thayer Headings, MD  traMADol  (ULTRAM) 50 MG tablet Take 50 mg by mouth 3 (three) times daily as needed.    Yes Historical Provider, MD  venlafaxine (EFFEXOR) 75 MG tablet Take 75 mg by mouth daily. 1/2 tablet 2 (two) times daily   Yes Historical Provider, MD  lisinopril (PRINIVIL,ZESTRIL) 40 MG tablet Take 1 tablet (40 mg total) by mouth daily. 09/07/11 09/06/12  Thayer Headings, MD     Physical Exam  Blood pressure  120/54, pulse 60, height 5\' 6"  (1.676 m), weight 106.867 kg (235 lb 9.6 oz), SpO2 95 %.  General: Pleasant, NAD Psych: Normal affect. Neuro: Alert and oriented X 3. Moves all extremities spontaneously. HEENT: Normal  Neck: Supple without bruits or JVD. Lungs:  Resp regular and unlabored, CTA. Heart: RRR no s3, s4. 1/6 sem lusb. Abdomen: Soft, non-tender, non-distended, BS + x 4.  Extremities: No clubbing, cyanosis or edema.  Marland Kitchen DP/PT/Radials 2+ and equal bilaterally.  ECG   Assessment and Plan    1. CAD - she's not having any episodes of chest discomfort. Her Plavix has been stopped because she's now on Eliquis. Continue current medications.  2. Hyperlipidemia - continue current medications. We'll plan on checking lipids again at her next visit.  3. Obstructive sleep apnea  4. Hypertension - her blood pressure is well-controlled.  She was accidentally put on amlodipine and diltiazem. We will make sure that she has stopped the amlodipine.  5. Atrial fib - she  converted with IV diltiazem and has now been maintained with  Cardizem.  We will continue the Cardizem for now. Continue Eliquis.  Will DC the coreg.  Will see her in 3 months     Nahser, Wonda Cheng, MD  12/29/2014 12:28 PM    [place] [address],  [place], [place]  [address] Pager [phone] Phone: [phone]; Fax: [phone]   [facility]  [address] [place], [place]  [address] [phone]   Fax [phone]

## 2014-12-29 NOTE — Patient Instructions (Signed)
Medication Instructions:  STOP Plavix (Clopidogrel) STOP Coreg (Carvedilol) STOP Amlodiopine (Norvasc) CONTINUE Dilitiazem (Cardizem) 120 mg once daily CONTINUE Eliquis 5 mg twice daily  Labwork: None Ordered   Testing/Procedures: None Ordered   Follow-Up: Your physician recommends that you schedule a follow-up appointment in: 3 months with Dr. Acie Fredrickson   Any Other Special Instructions Will Be Listed Below (If Applicable).

## 2015-01-06 ENCOUNTER — Other Ambulatory Visit: Payer: Self-pay | Admitting: Cardiovascular Disease

## 2015-01-10 ENCOUNTER — Emergency Department (HOSPITAL_COMMUNITY): Payer: Medicare Other

## 2015-01-10 ENCOUNTER — Emergency Department (HOSPITAL_COMMUNITY)
Admission: EM | Admit: 2015-01-10 | Discharge: 2015-01-10 | Disposition: A | Discharge status: Home / Self Care | Payer: Medicare Other | Attending: Internal Medicine | Admitting: Internal Medicine

## 2015-01-10 ENCOUNTER — Encounter (HOSPITAL_COMMUNITY): Payer: Self-pay | Admitting: Emergency Medicine

## 2015-01-10 DIAGNOSIS — M79642 Pain in left hand: Secondary | ICD-10-CM | POA: Diagnosis not present

## 2015-01-10 DIAGNOSIS — Z792 Long term (current) use of antibiotics: Secondary | ICD-10-CM | POA: Diagnosis not present

## 2015-01-10 DIAGNOSIS — Z79899 Other long term (current) drug therapy: Secondary | ICD-10-CM | POA: Insufficient documentation

## 2015-01-10 DIAGNOSIS — Z780 Asymptomatic menopausal state: Secondary | ICD-10-CM | POA: Diagnosis not present

## 2015-01-10 DIAGNOSIS — Y9389 Activity, other specified: Secondary | ICD-10-CM | POA: Diagnosis not present

## 2015-01-10 DIAGNOSIS — Z8669 Personal history of other diseases of the nervous system and sense organs: Secondary | ICD-10-CM | POA: Insufficient documentation

## 2015-01-10 DIAGNOSIS — S59912A Unspecified injury of left forearm, initial encounter: Secondary | ICD-10-CM | POA: Diagnosis not present

## 2015-01-10 DIAGNOSIS — S0990XA Unspecified injury of head, initial encounter: Secondary | ICD-10-CM | POA: Diagnosis not present

## 2015-01-10 DIAGNOSIS — Y92410 Unspecified street and highway as the place of occurrence of the external cause: Secondary | ICD-10-CM | POA: Diagnosis not present

## 2015-01-10 DIAGNOSIS — I2510 Atherosclerotic heart disease of native coronary artery without angina pectoris: Secondary | ICD-10-CM | POA: Insufficient documentation

## 2015-01-10 DIAGNOSIS — E669 Obesity, unspecified: Secondary | ICD-10-CM | POA: Insufficient documentation

## 2015-01-10 DIAGNOSIS — Z87891 Personal history of nicotine dependence: Secondary | ICD-10-CM | POA: Diagnosis not present

## 2015-01-10 DIAGNOSIS — N390 Urinary tract infection, site not specified: Secondary | ICD-10-CM | POA: Diagnosis not present

## 2015-01-10 DIAGNOSIS — Y998 Other external cause status: Secondary | ICD-10-CM | POA: Insufficient documentation

## 2015-01-10 DIAGNOSIS — F419 Anxiety disorder, unspecified: Secondary | ICD-10-CM | POA: Insufficient documentation

## 2015-01-10 DIAGNOSIS — T148 Other injury of unspecified body region: Secondary | ICD-10-CM | POA: Diagnosis not present

## 2015-01-10 DIAGNOSIS — Z9889 Other specified postprocedural states: Secondary | ICD-10-CM | POA: Diagnosis not present

## 2015-01-10 DIAGNOSIS — S60222A Contusion of left hand, initial encounter: Secondary | ICD-10-CM | POA: Insufficient documentation

## 2015-01-10 DIAGNOSIS — S6992XA Unspecified injury of left wrist, hand and finger(s), initial encounter: Secondary | ICD-10-CM | POA: Diagnosis not present

## 2015-01-10 DIAGNOSIS — I10 Essential (primary) hypertension: Secondary | ICD-10-CM | POA: Diagnosis not present

## 2015-01-10 DIAGNOSIS — S199XXA Unspecified injury of neck, initial encounter: Secondary | ICD-10-CM | POA: Insufficient documentation

## 2015-01-10 DIAGNOSIS — K219 Gastro-esophageal reflux disease without esophagitis: Secondary | ICD-10-CM | POA: Diagnosis not present

## 2015-01-10 DIAGNOSIS — M1990 Unspecified osteoarthritis, unspecified site: Secondary | ICD-10-CM | POA: Diagnosis not present

## 2015-01-10 DIAGNOSIS — M25532 Pain in left wrist: Secondary | ICD-10-CM | POA: Diagnosis not present

## 2015-01-10 DIAGNOSIS — M797 Fibromyalgia: Secondary | ICD-10-CM | POA: Diagnosis not present

## 2015-01-10 LAB — URINALYSIS, ROUTINE W REFLEX MICROSCOPIC
Bilirubin Urine: NEGATIVE
GLUCOSE, UA: NEGATIVE mg/dL
Hgb urine dipstick: NEGATIVE
KETONES UR: NEGATIVE mg/dL
Nitrite: NEGATIVE
PH: 5.5 (ref 5.0–8.0)
Protein, ur: NEGATIVE mg/dL
Specific Gravity, Urine: 1.015 (ref 1.005–1.030)
Urobilinogen, UA: 0.2 mg/dL (ref 0.0–1.0)

## 2015-01-10 LAB — URINE MICROSCOPIC-ADD ON

## 2015-01-10 MED ORDER — FOSFOMYCIN TROMETHAMINE 3 G PO PACK
3.0000 g | PACK | Freq: Once | ORAL | Status: DC
  Filled 2015-01-10: qty 3

## 2015-01-10 MED ORDER — FOSFOMYCIN TROMETHAMINE 3 G PO PACK
3.0000 g | PACK | Freq: Once | ORAL | Status: AC
  Administered 2015-01-10: 3 g via ORAL
  Filled 2015-01-10: qty 3
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
## 2015-01-10 NOTE — ED Notes (Signed)
Bed: WA09 Expected date:  Expected time:  Means of arrival:  Comments: EMS 

## 2015-01-10 NOTE — Discharge Instructions (Signed)
Hand Contusion A hand contusion is a deep bruise on your hand area. Contusions are the result of an injury that caused bleeding under the skin. The contusion may turn blue, purple, or yellow. Minor injuries will give you a painless contusion, but more severe contusions may stay painful and swollen for a few weeks. CAUSES  A contusion is usually caused by a blow, trauma, or direct force to an area of the body. SYMPTOMS   Swelling and redness of the injured area.  Discoloration of the injured area.  Tenderness and soreness of the injured area.  Pain. DIAGNOSIS  The diagnosis can be made by taking a history and performing a physical exam. An X-ray, CT scan, or MRI may be needed to determine if there were any associated injuries, such as broken bones (fractures). TREATMENT  Often, the best treatment for a hand contusion is resting, elevating, icing, and applying cold compresses to the injured area. Over-the-counter medicines may also be recommended for pain control. HOME CARE INSTRUCTIONS   Put ice on the injured area.  Put ice in a plastic bag.  Place a towel between your skin and the bag.  Leave the ice on for 15-20 minutes, 03-04 times a day.  Only take over-the-counter or prescription medicines as directed by your caregiver. Your caregiver may recommend avoiding anti-inflammatory medicines (aspirin, ibuprofen, and naproxen) for 48 hours because these medicines may increase bruising.  If told, use an elastic wrap as directed. This can help reduce swelling. You may remove the wrap for sleeping, showering, and bathing. If your fingers become numb, cold, or blue, take the wrap off and reapply it more loosely.  Elevate your hand with pillows to reduce swelling.  Avoid overusing your hand if it is painful. SEEK IMMEDIATE MEDICAL CARE IF:   You have increased redness, swelling, or pain in your hand.  Your swelling or pain is not relieved with medicines.  You have loss of feeling in  your hand or are unable to move your fingers.  Your hand turns cold or blue.  You have pain when you move your fingers.  Your hand becomes warm to the touch.  Your contusion does not improve in 2 days. MAKE SURE YOU:   Understand these instructions.  Will watch your condition.  Will get help right away if you are not doing well or get worse. Document Released: 10/27/2001 Document Revised: 01/30/2012 Document Reviewed: 10/29/2011 [facility] Patient Information 2015 [place], [place]. This information is not intended to replace advice given to you by your health care provider. Make sure you discuss any questions you have with your health care provider. Urinary Tract Infection Urinary tract infections (UTIs) can develop anywhere along your urinary tract. Your urinary tract is your body's drainage system for removing wastes and extra water. Your urinary tract includes two kidneys, two ureters, a bladder, and a urethra. Your kidneys are a pair of bean-shaped organs. Each kidney is about the size of your fist. They are located below your ribs, one on each side of your spine. CAUSES Infections are caused by microbes, which are microscopic organisms, including fungi, viruses, and bacteria. These organisms are so small that they can only be seen through a microscope. Bacteria are the microbes that most commonly cause UTIs. SYMPTOMS  Symptoms of UTIs may vary by age and gender of the patient and by the location of the infection. Symptoms in young women typically include a frequent and intense urge to urinate and a painful, burning feeling in the  bladder or urethra during urination. Older women and men are more likely to be tired, shaky, and weak and have muscle aches and abdominal pain. A fever may mean the infection is in your kidneys. Other symptoms of a kidney infection include pain in your back or sides below the ribs, nausea, and vomiting. DIAGNOSIS To diagnose a UTI, your caregiver will ask you  about your symptoms. Your caregiver also will ask to provide a urine sample. The urine sample will be tested for bacteria and white blood cells. White blood cells are made by your body to help fight infection. TREATMENT  Typically, UTIs can be treated with medication. Because most UTIs are caused by a bacterial infection, they usually can be treated with the use of antibiotics. The choice of antibiotic and length of treatment depend on your symptoms and the type of bacteria causing your infection. HOME CARE INSTRUCTIONS  If you were prescribed antibiotics, take them exactly as your caregiver instructs you. Finish the medication even if you feel better after you have only taken some of the medication.  Drink enough water and fluids to keep your urine clear or pale yellow.  Avoid caffeine, tea, and carbonated beverages. They tend to irritate your bladder.  Empty your bladder often. Avoid holding urine for long periods of time.  Empty your bladder before and after sexual intercourse.  After a bowel movement, women should cleanse from front to back. Use each tissue only once. SEEK MEDICAL CARE IF:   You have back pain.  You develop a fever.  Your symptoms do not begin to resolve within 3 days. SEEK IMMEDIATE MEDICAL CARE IF:   You have severe back pain or lower abdominal pain.  You develop chills.  You have nausea or vomiting.  You have continued burning or discomfort with urination. MAKE SURE YOU:   Understand these instructions.  Will watch your condition.  Will get help right away if you are not doing well or get worse. Document Released: 02/14/2005 Document Revised: 11/06/2011 Document Reviewed: 06/15/2011 [facility] Patient Information 2015 [place], [place]. This information is not intended to replace advice given to you by your health care provider. Make sure you discuss any questions you have with your health care provider.

## 2015-01-11 LAB — URINE CULTURE: SPECIAL REQUESTS: NORMAL

## 2015-01-12 DIAGNOSIS — M79642 Pain in left hand: Secondary | ICD-10-CM | POA: Diagnosis not present

## 2015-01-26 DIAGNOSIS — M79642 Pain in left hand: Secondary | ICD-10-CM | POA: Diagnosis not present

## 2015-02-08 DIAGNOSIS — M545 Low back pain: Secondary | ICD-10-CM | POA: Diagnosis not present

## 2015-02-08 DIAGNOSIS — M5417 Radiculopathy, lumbosacral region: Secondary | ICD-10-CM | POA: Diagnosis not present

## 2015-02-08 DIAGNOSIS — M4806 Spinal stenosis, lumbar region: Secondary | ICD-10-CM | POA: Diagnosis not present

## 2015-02-08 DIAGNOSIS — G894 Chronic pain syndrome: Secondary | ICD-10-CM | POA: Diagnosis not present

## 2015-02-08 DIAGNOSIS — M47816 Spondylosis without myelopathy or radiculopathy, lumbar region: Secondary | ICD-10-CM | POA: Diagnosis not present

## 2015-03-01 ENCOUNTER — Other Ambulatory Visit: Payer: Self-pay | Admitting: Cardiovascular Disease

## 2015-03-01 MED ORDER — LISINOPRIL 40 MG PO TABS: 40.0000 mg | ORAL_TABLET | Freq: Every morning | ORAL | Status: DC

## 2015-03-31 ENCOUNTER — Ambulatory Visit: Payer: Medicare Other | Admitting: Cardiovascular Disease

## 2015-04-06 DIAGNOSIS — N3020 Other chronic cystitis without hematuria: Secondary | ICD-10-CM | POA: Diagnosis not present

## 2015-04-06 DIAGNOSIS — N390 Urinary tract infection, site not specified: Secondary | ICD-10-CM | POA: Diagnosis not present

## 2015-04-06 DIAGNOSIS — R351 Nocturia: Secondary | ICD-10-CM | POA: Diagnosis not present

## 2015-04-08 ENCOUNTER — Ambulatory Visit: Payer: Medicare Other | Admitting: Cardiovascular Disease

## 2015-04-10 NOTE — Progress Notes (Signed)
This encounter was created in error - please disregard.

## 2015-04-11 ENCOUNTER — Encounter: Payer: Medicare Other | Admitting: Cardiovascular Disease

## 2015-04-18 DIAGNOSIS — N390 Urinary tract infection, site not specified: Secondary | ICD-10-CM | POA: Diagnosis not present

## 2015-04-18 DIAGNOSIS — N3020 Other chronic cystitis without hematuria: Secondary | ICD-10-CM | POA: Diagnosis not present

## 2015-05-04 DIAGNOSIS — H21261 Iris atrophy (essential) (progressive), right eye: Secondary | ICD-10-CM | POA: Diagnosis not present

## 2015-05-04 DIAGNOSIS — H25813 Combined forms of age-related cataract, bilateral: Secondary | ICD-10-CM | POA: Diagnosis not present

## 2015-05-04 DIAGNOSIS — H4051X4 Glaucoma secondary to other eye disorders, right eye, indeterminate stage: Secondary | ICD-10-CM | POA: Diagnosis not present

## 2015-05-04 DIAGNOSIS — D3132 Benign neoplasm of left choroid: Secondary | ICD-10-CM | POA: Diagnosis not present

## 2015-05-09 DIAGNOSIS — M5417 Radiculopathy, lumbosacral region: Secondary | ICD-10-CM | POA: Diagnosis not present

## 2015-05-09 DIAGNOSIS — G8929 Other chronic pain: Secondary | ICD-10-CM | POA: Diagnosis not present

## 2015-05-09 DIAGNOSIS — M4806 Spinal stenosis, lumbar region: Secondary | ICD-10-CM | POA: Diagnosis not present

## 2015-05-09 DIAGNOSIS — M5441 Lumbago with sciatica, right side: Secondary | ICD-10-CM | POA: Diagnosis not present

## 2015-05-09 DIAGNOSIS — M4726 Other spondylosis with radiculopathy, lumbar region: Secondary | ICD-10-CM | POA: Diagnosis not present

## 2015-05-09 DIAGNOSIS — M5136 Other intervertebral disc degeneration, lumbar region: Secondary | ICD-10-CM | POA: Diagnosis not present

## 2015-05-09 DIAGNOSIS — M5442 Lumbago with sciatica, left side: Secondary | ICD-10-CM | POA: Diagnosis not present

## 2015-05-17 DIAGNOSIS — G8929 Other chronic pain: Secondary | ICD-10-CM | POA: Diagnosis not present

## 2015-05-17 DIAGNOSIS — M5126 Other intervertebral disc displacement, lumbar region: Secondary | ICD-10-CM | POA: Diagnosis not present

## 2015-05-17 DIAGNOSIS — M4310 Spondylolisthesis, site unspecified: Secondary | ICD-10-CM | POA: Diagnosis not present

## 2015-05-17 DIAGNOSIS — M4806 Spinal stenosis, lumbar region: Secondary | ICD-10-CM | POA: Diagnosis not present

## 2015-05-17 DIAGNOSIS — M4800 Spinal stenosis, site unspecified: Secondary | ICD-10-CM | POA: Diagnosis not present

## 2015-05-17 DIAGNOSIS — M545 Low back pain: Secondary | ICD-10-CM | POA: Diagnosis not present

## 2015-06-07 DIAGNOSIS — M899 Disorder of bone, unspecified: Secondary | ICD-10-CM | POA: Diagnosis not present

## 2015-07-08 ENCOUNTER — Ambulatory Visit (INDEPENDENT_AMBULATORY_CARE_PROVIDER_SITE_OTHER): Payer: Medicare Other | Admitting: Physician Assistant

## 2015-07-08 ENCOUNTER — Ambulatory Visit (INDEPENDENT_AMBULATORY_CARE_PROVIDER_SITE_OTHER): Payer: Medicare Other

## 2015-07-08 VITALS — BP 118/60 | HR 82 | Temp 98.3°F | Resp 18 | Ht 66.0 in | Wt 232.0 lb

## 2015-07-08 DIAGNOSIS — S91312A Laceration without foreign body, left foot, initial encounter: Secondary | ICD-10-CM

## 2015-07-08 DIAGNOSIS — S99922A Unspecified injury of left foot, initial encounter: Secondary | ICD-10-CM | POA: Diagnosis not present

## 2015-07-08 NOTE — Patient Instructions (Signed)
WOUND CARE Please return in 10 days to have your stitches/staples removed or sooner if you have concerns. . Keep area clean and dry with dressing in place for 24 hours. . After 24 hours, remove bandage and wash wound gently with mild soap and warm water. When skin is dry, reapply a new bandage. Once wound is no longer draining, may leave wound open to air. . Continue daily cleansing with soap and water until stitches/staples are removed. . Do not apply any ointments or creams to the wound while stitches/staples are in place, as this may cause delayed healing. . Notify the office if you experience any of the following signs of infection: Swelling, redness, pus drainage, streaking, fever >101.0 F . Notify the office if you experience excessive bleeding that does not stop after 15-20 minutes of constant, firm pressure.   

## 2015-07-08 NOTE — Progress Notes (Signed)
[facility] [address], [place] [address] [phone]  Date:  07/08/2015   Name:  [name]   DOB:  1940-07-07   MRN:  [id number]  PCP:  Donnie Coffin, MD    Chief Complaint: Laceration   History of Present Illness:  This is a 75 y.o. female with PMH A fib, OSA, fibromyalgia, CAD, HTN, HLD, hx MI who is presenting with a laceration to her left lateral foot that occurred 15 hours ago. She was walking in her house and stepped on a piece of glass. She states the cleaned the wound with soap and water when it happened. She soaked her foot in salt water a few hours ago. She does not think there is any glass still in her foot. She denies paresthesias. She has not taken anything for pain. She is on eliquis.  Review of Systems:  Review of Systems See HPI  Patient Active Problem List   Diagnosis Date Noted  . New onset atrial fibrillation ([place])   . Atrial fibrillation with RVR ([place]) 12/19/2014  . Atrial fibrillation ([place]) 12/19/2014  . Multiple thyroid nodules 01/12/2014  . OSA (obstructive sleep apnea)   . Obesity (BMI 30-39.9)   . Sleep apnea 06/22/2013  . Fibromyalgia   . Dizziness 09/15/2010  . Orthostatic hypotension 09/15/2010  . Hematuria 09/15/2010  . Coronary artery disease   . Hypertension   . Hyperlipidemia   . Fatigue   . History of myocardial infarction     Prior to Admission medications   Medication Sig Start Date End Date Taking? Authorizing Provider  apixaban (ELIQUIS) 5 MG TABS tablet Take 1 tablet (5 mg total) by mouth 2 (two) times daily. 12/21/14  Yes Thayer Headings, MD  CARTIA XT 120 MG 24 hr capsule Take 120 mg by mouth daily. 12/20/14  Yes Historical Provider, MD  ferrous sulfate 325 (65 FE) MG tablet Take 325 mg by mouth every morning.    Yes Historical Provider, MD  hydrochlorothiazide (HYDRODIURIL) 25 MG tablet Take 1 tablet (25 mg total) by mouth daily. 08/17/14  Yes Thayer Headings, MD  HYDROcodone-acetaminophen [facility])  10-325 MG per tablet Take 0.5-1 tablets by mouth every 8 (eight) hours as needed for moderate pain.  12/01/14  Yes Historical Provider, MD  lisinopril (PRINIVIL,ZESTRIL) 40 MG tablet Take 1 tablet (40 mg total) by mouth every morning. 03/01/15  Yes Thayer Headings, MD  Multiple Minerals (CALCIUM-MAGNESIUM-ZINC) TABS Take 1 tablet by mouth daily.   Yes Historical Provider, MD  nitrofurantoin (MACRODANTIN) 100 MG capsule Take 100 mg by mouth daily.  12/22/13  Yes Historical Provider, MD  nitroGLYCERIN (NITROSTAT) 0.4 MG SL tablet Place 1 tablet (0.4 mg total) under the tongue every 5 (five) minutes x 3 doses as needed for chest pain. For chest pain 12/29/14  Yes Thayer Headings, MD  pantoprazole (PROTONIX) 40 MG tablet Take 40 mg by mouth daily.  01/07/14  Yes Historical Provider, MD  potassium chloride (K-DUR) 10 MEQ tablet TAKE 1 TABLET BY MOUTH DAILY Patient taking differently: TAKE 10 MEQ BY MOUTH DAILY 01/06/15  Yes Thayer Headings, MD  potassium chloride SA (K-DUR,KLOR-CON) 20 MEQ tablet Take 1 tablet (20 mEq total) by mouth daily. 08/17/14  Yes Thayer Headings, MD  pramipexole (MIRAPEX) 1 MG tablet Take 1 mg by mouth at bedtime.    Yes Historical Provider, MD  venlafaxine (EFFEXOR) 75 MG tablet Take 37.5 mg by mouth 2 (two) times daily.  Yes Historical Provider, MD    Allergies  Allergen Reactions  . Codeine Nausea And Vomiting and Other (See Comments)    Pt gets very hot and flushed  . Cortisone Nausea Only and Other (See Comments)    Pt gets very hot and flushed  . Sulfamethoxazole-Trimethoprim Other (See Comments)    Reaction: unknown    Past Surgical History  Procedure Laterality Date  . Cardiac catheterization  06/29/2010    Mild to moderate coronary artery irregularities.  Her  proximal left anterior descending artery is moderately narrowed.  She  also has an eccentric stenosis in the proximal LAD.  These do not appear  to obstruct flow at present, but they are fairly small vessels.   They  appeared to be between 1.5 and 2 mm in diamete  . Cardiac catheterization  08/02/2009    Patent stent  . Cardiac catheterization  05/12/2008    Stent to the distal RCA    Social History  Substance Use Topics  . Smoking status: Former Smoker    Types: Cigarettes    Quit date: 09/13/1960  . Smokeless tobacco: Never Used  . Alcohol Use: No    Family History  Problem Relation Age of Onset  . Heart attack Mother   . Coronary artery disease Brother     had CABG  . Coronary artery disease Brother   . Coronary artery disease Brother   . Coronary artery disease Brother   . Coronary artery disease Brother   . Heart attack Brother   . Cancer Father     Medication list has been reviewed and updated.  Physical Examination:  Physical Exam  Constitutional: She is oriented to person, place, and time. She appears well-developed and well-nourished. No distress.  HENT:  Head: Normocephalic and atraumatic.  Right Ear: Hearing normal.  Left Ear: Hearing normal.  Nose: Nose normal.  Eyes: Conjunctivae and lids are normal. Right eye exhibits no discharge. Left eye exhibits no discharge. No scleral icterus.  Pulmonary/Chest: Effort normal. No respiratory distress.  Musculoskeletal: Normal range of motion.  Neurological: She is alert and oriented to person, place, and time.  Skin: Skin is warm and dry.  1.5 cm laceration left lateral foot. No sign of infection. No foreign body noted. TTP. Pedal pulses intact.  Psychiatric: She has a normal mood and affect. Her speech is normal and behavior is normal. Thought content normal.   BP 118/60 mmHg  Pulse 82  Temp(Src) 98.3 F (36.8 C) (Oral)  Resp 18  Ht 5\' 6"  (1.676 m)  Wt 232 lb (105.235 kg)  BMI 37.46 kg/m2  SpO2 97%  Procedure: Verbal consent obtained. Skin was anesthetized with 3 cc 1% lido with epi and cleaned with soap and water. A 1.5 cm laceration located on left lateral foot was sutured with #4 4.0 SI prolene sutures. Wound  was dressed and wound care discussed.  Dg Foot 2 Views Left  07/08/2015  CLINICAL DATA:  Stepped on glass last night.  Rule out foreign body. EXAM: LEFT FOOT - 2 VIEW COMPARISON:  None. FINDINGS: Mild degenerative change over the first MTP joint and interphalangeal joints. Mild degenerative change over the midfoot. Small inferior calcaneal spur. No definite radiopaque foreign body within the soft tissues. No evidence of fracture or dislocation. IMPRESSION: No acute findings.  No radiopaque foreign body. Electronically Signed   By: Marin Olp M.D.   On: 07/08/2015 17:38   Assessment and Plan:  1. Laceration of foot, left, initial encounter  Repaired with #4 sutures. Wound care discussed. Wound clean, no need for abx. Return in 10 days for suture removal. - DG Foot 2 Views Left; Future   Benjaman Pott. Drenda Freeze, MHS [facility]  07/10/2015

## 2015-07-19 ENCOUNTER — Ambulatory Visit (INDEPENDENT_AMBULATORY_CARE_PROVIDER_SITE_OTHER): Payer: Medicare Other | Admitting: Physician Assistant

## 2015-07-19 VITALS — BP 142/60 | HR 80 | Temp 98.9°F | Resp 16 | Wt 231.8 lb

## 2015-07-19 DIAGNOSIS — Z5189 Encounter for other specified aftercare: Secondary | ICD-10-CM

## 2015-07-19 NOTE — Progress Notes (Signed)
   07/19/2015 1:05 PM   DOB: June 30, 1940 / MRN: [id number]  SUBJECTIVE:  [name] is a well appearing 75 y.o. here today for wound care. She denies exquisite tenderness at the site of the wound, nausea, emesis, fever and chills.  She has been compliant with medical therapy and recommendations thus far.   She is allergic to codeine; cortisone; and sulfamethoxazole-trimethoprim.   She  has a past medical history of Hyperlipidemia; Fatigue; Anxiety; Coronary artery disease; Fibromyalgia; Essential iris atrophy; Menopause; Glaucoma; Environmental allergies; GERD (gastroesophageal reflux disease); Osteoarthritis; OSA (obstructive sleep apnea); Obesity (BMI 30-39.9); and Hypertension.    She  reports that she quit smoking about 54 years ago. Her smoking use included Cigarettes. She has never used smokeless tobacco. She reports that she does not drink alcohol or use illicit drugs. She  has no sexual activity history on file. The patient  has past surgical history that includes Cardiac catheterization (06/29/2010); Cardiac catheterization (08/02/2009); and Cardiac catheterization (05/12/2008).  Her family history includes Cancer in her father; Coronary artery disease in her brother, brother, brother, brother, and brother; Heart attack in her brother and mother.  ROS  PER HPI  Problem list and medications reviewed and updated by myself where necessary, and exist elsewhere in the encounter.   OBJECTIVE:  BP 142/60 mmHg  Pulse 80  Temp(Src) 98.9 F (37.2 C) (Oral)  Resp 16  Wt 231 lb 12.8 oz (105.144 kg)  SpO2 96% CrCl cannot be calculated (Patient has no serum creatinine result on file.).  Physical Exam  Constitutional: She is oriented to person, place, and time. She appears well-nourished. No distress.  Eyes: EOM are normal. Pupils are equal, round, and reactive to light.  Cardiovascular: Normal rate.   Pulmonary/Chest: Effort normal.  Abdominal: She exhibits no distension.  Musculoskeletal:      Feet:  Neurological: She is alert and oriented to person, place, and time. No cranial nerve deficit. Gait normal.  Skin: Skin is dry. She is not diaphoretic.  Psychiatric: She has a normal mood and affect.  Vitals reviewed.   No results found for this or any previous visit (from the past 48 hour(s)).  ASSESSMENT AND PLAN  [name] was seen today for suture removal.  Diagnoses and all orders for this visit:  Encounter for wound care: Sutures removed.  RTC PRN.    The patient was advised to call or return to clinic if she does not see an improvement in symptoms or to seek the care of the closest emergency department if she worsens with the above plan.   Philis Fendt, MHS, PA-C [facility] 07/19/2015 1:05 PM

## 2015-07-29 ENCOUNTER — Ambulatory Visit (INDEPENDENT_AMBULATORY_CARE_PROVIDER_SITE_OTHER): Payer: Medicare Other | Admitting: Cardiovascular Disease

## 2015-07-29 ENCOUNTER — Encounter: Payer: Self-pay | Admitting: Cardiovascular Disease

## 2015-07-29 VITALS — BP 140/70 | HR 68 | Ht 66.0 in | Wt 230.2 lb

## 2015-07-29 DIAGNOSIS — I2583 Coronary atherosclerosis due to lipid rich plaque: Secondary | ICD-10-CM | POA: Diagnosis not present

## 2015-07-29 DIAGNOSIS — I2510 Atherosclerotic heart disease of native coronary artery without angina pectoris: Secondary | ICD-10-CM | POA: Diagnosis not present

## 2015-07-29 DIAGNOSIS — I4891 Unspecified atrial fibrillation: Secondary | ICD-10-CM

## 2015-07-29 NOTE — Patient Instructions (Signed)
Medication Instructions:  Your physician recommends that you continue on your current medications as directed. Please refer to the Current Medication list given to you today.   Labwork: TODAY - cholesterol, complete metabolic panel   Testing/Procedures: None Ordered   Follow-Up: Your physician wants you to follow-up in: 1 year with Dr. Nahser.  You will receive a reminder letter in the mail two months in advance. If you don't receive a letter, please call our office to schedule the follow-up appointment.   If you need a refill on your cardiac medications before your next appointment, please call your pharmacy.   Thank you for choosing [facility]! Ilah Boule, RN [phone]    

## 2015-07-29 NOTE — Progress Notes (Signed)
Patient Name: [name] Date of Encounter: 07/29/2015  Primary Care Provider:  Donnie Coffin, MD Primary Cardiologist:  Joaquim Nam, MD  Problem List  1. CAD 2. Hyperlipidemia 3. Obstructive sleep apnea 4. Hypertension 5. Atrial fib  HPI  [birth year]18:  75 year old female with the above problem list.  She was in usual state of health about 3 weeks ago when she began to note mild lower extremity edema with bilateral ankle and calf pain with ambulation.  She takes her left leg has been more swollen than the right.  She has been relatively active recently partaking and gardening without any chest pain or dyspnea.  Her weight is actually down from baseline.  She's seen no change in her salt intake or amt of time that her legs might be in a dependent position.  She's quite flustered and anxious today.  She misplaced her phone and her dtr initially dropped her off @ the old [place] cardiology office and she had to walk down the street to here.  She doesn't know how to reach her dtr to have her come down and pick her up.  She's tearful and emotionally upset.  In that setting, after the visit, pt said that she thinks she's having chest pain.  An ecg was done, and before it was even completed, c/p resolved, as did her emotional upset.  Oct 08, 2012:  [name] is doing well. She's not had any episodes of chest pain or shortness of breath. She was seen by our PA last year. She has a history of coronary artery disease but has not had any recurrent angina. She's been working out in her garden without difficulties.  She has occasional episodes of orthostatic hypotension and also notes some occasional leg edema. I suspect this is from the amlodipine.  October 20, 2013:  [name] has seen [name] and was referred to [name] for management of her sleep apnea.   Her BP has been well controlled.  She needs to have a special dental appliance made for her OSA- did not tolerate the CPAP. She has chronic  dyspnea. No CP   She has found to have some thyroid issues.  Sees Dr. Alroy Dust for this.     December 29, 2014:  [name] is seen back today for evaulation of her CAD and HTN  And a recent episode of atrial fib. She was admitted and converted back to normal  ( ? No CP , BP has been well controlled    July 29, 2015:  Has chronic fatigue  Has had thyroid issues Doing well from a cardiac standpoint     Past Medical History  Diagnosis Date  . Hyperlipidemia   . Fatigue   . Anxiety   . Coronary artery disease     a. s/p MI 2009 - PCI/DES distal  RCA w/ 2.75 x 18 Xience DES;  b. 05/2010 Myoview: Apical thinning, EF 73%;  c. 06/2010 Cath: moderate nonobs dzs, EF 65%;  d.  Lexiscan Myoview (07/2013):  No scar or ischemia, EF 67%; Normal Study  . Fibromyalgia   . Essential iris atrophy     Right side  . Menopause   . Glaucoma   . Environmental allergies   . GERD (gastroesophageal reflux disease)   . Osteoarthritis   . OSA (obstructive sleep apnea)   . Obesity (BMI 30-39.9)   . Hypertension    Past Surgical History  Procedure Laterality Date  . Cardiac catheterization  06/29/2010  Mild to moderate coronary artery irregularities.  Her  proximal left anterior descending artery is moderately narrowed.  She  also has an eccentric stenosis in the proximal LAD.  These do not appear  to obstruct flow at present, but they are fairly small vessels.  They  appeared to be between 1.5 and 2 mm in diamete  . Cardiac catheterization  08/02/2009    Patent stent  . Cardiac catheterization  05/12/2008    Stent to the distal RCA    Allergies  Allergies  Allergen Reactions  . Codeine Nausea And Vomiting and Other (See Comments)    Pt gets very hot and flushed  . Cortisone Nausea Only and Other (See Comments)    Pt gets very hot and flushed  . Sulfamethoxazole-Trimethoprim Other (See Comments)    Reaction: unknown      Home Medications  Prior to Admission medications   Medication Sig Start  Date End Date Taking? Authorizing Provider  acetaminophen (TYLENOL) 500 MG tablet Take 500 mg by mouth as needed.    Yes Historical Provider, MD  amitriptyline (ELAVIL) 10 MG tablet Take 10 mg by mouth at bedtime.   Yes Historical Provider, MD  amLODipine (NORVASC) 5 MG tablet Take 1 tablet (5 mg total) by mouth daily. 09/03/11 09/02/12 Yes Thayer Headings, MD  aspirin 325 MG tablet Take 325 mg by mouth daily.     Yes Historical Provider, MD  carvedilol (COREG) 12.5 MG tablet Take 1 tablet (12.5 mg total) by mouth 2 (two) times daily. 09/03/11 09/02/12 Yes Thayer Headings, MD  clopidogrel (PLAVIX) 75 MG tablet TAKE ONE TABLET BY MOUTH DAILY 07/06/11  Yes Burtis Junes, NP  diclofenac sodium (VOLTAREN) 1 % GEL Apply topically. **CREAM** use as directed    Yes Historical Provider, MD  fluticasone (FLONASE) 50 MCG/ACT nasal spray Place 1 spray into the nose daily.   Yes Historical Provider, MD  hydrochlorothiazide 25 MG tablet Take 25 mg by mouth daily.    Yes Historical Provider, MD  HYDROcodone-homatropine (HYCODAN) 5-1.5 MG/5ML syrup Take 5 mLs by mouth every 8 (eight) hours as needed for cough. 08/29/11 09/08/11 Yes Robyn Haber, MD  IRON PO Take 1 tablet by mouth daily.     Yes Historical Provider, MD  levofloxacin (LEVAQUIN) 500 MG tablet Take 1 tablet (500 mg total) by mouth daily. 08/29/11 09/08/11 Yes Robyn Haber, MD  methimazole (TAPAZOLE) 5 MG tablet Take 5 mg by mouth daily.   Yes Historical Provider, MD  nitroGLYCERIN (NITROSTAT) 0.4 MG SL tablet Place 0.4 mg under the tongue every 5 (five) minutes as needed.     Yes Historical Provider, MD  pantoprazole (PROTONIX) 40 MG tablet Take 40 mg by mouth 2 (two) times daily.    Yes Historical Provider, MD  potassium chloride (KLOR-CON) 10 MEQ CR tablet Take 1 tablet (10 mEq total) by mouth daily. 02/23/11  Yes Thayer Headings, MD  pramipexole (MIRAPEX) 0.25 MG tablet Take 1 mg by mouth at bedtime.    Yes Historical Provider, MD  rosuvastatin  (CRESTOR) 10 MG tablet Take 1 tablet (10 mg total) by mouth daily. 06/06/11  Yes Thayer Headings, MD  traMADol (ULTRAM) 50 MG tablet Take 50 mg by mouth 3 (three) times daily as needed.    Yes Historical Provider, MD  venlafaxine (EFFEXOR) 75 MG tablet Take 75 mg by mouth daily. 1/2 tablet 2 (two) times daily   Yes Historical Provider, MD  lisinopril (PRINIVIL,ZESTRIL) 40 MG tablet Take 1  tablet (40 mg total) by mouth daily. 09/07/11 09/06/12  Thayer Headings, MD     Physical Exam  Blood pressure 140/70, pulse 68, height 5\' 6"  (1.676 m), weight 230 lb 3.2 oz (104.418 kg).  General: Pleasant, NAD Psych: Normal affect. Neuro: Alert and oriented X 3. Moves all extremities spontaneously. HEENT: Normal  Neck: Supple without bruits or JVD. Lungs:  Resp regular and unlabored, CTA. Heart: RRR no s3, s4. 1/6 sem lusb. Abdomen: Soft, non-tender, non-distended, BS + x 4.  Extremities: No clubbing, cyanosis or edema.  Marland Kitchen DP/PT/Radials 2+ and equal bilaterally.  ECG   Assessment and Plan    1. CAD - she's not having any episodes of chest discomfort. Her Plavix has been stopped because she's now on Eliquis. Continue current medications.  2. Hyperlipidemia - continue current medications. We'll plan on checking lipids again today   3. Obstructive sleep apnea-  Needs to get back to see her doctor who has been managing this   4. Hypertension - her blood pressure is well-controlled.    5. Atrial fib - she  converted with IV diltiazem and has now been maintained with  Cardizem.  We will continue the Cardizem for now. Continue Eliquis.  Will DC the coreg.   Will see her in 1 year    Nahser, Wonda Cheng, MD  07/29/2015 3:20 PM    [facility] [address],  [place], [place]  [address] Pager [phone] Phone: [phone]; Fax: [phone]   [facility]  [address] [place], [place]  [address] [phone]   Fax [phone]

## 2015-07-30 LAB — COMPREHENSIVE METABOLIC PANEL
ALBUMIN: 3.6 g/dL (ref 3.6–5.1)
ALT: 52 U/L — ABNORMAL HIGH (ref 6–29)
AST: 49 U/L — ABNORMAL HIGH (ref 10–35)
Alkaline Phosphatase: 77 U/L (ref 33–130)
BUN: 30 mg/dL — ABNORMAL HIGH (ref 7–25)
CALCIUM: 9.8 mg/dL (ref 8.6–10.4)
CHLORIDE: 101 mmol/L (ref 98–110)
CO2: 22 mmol/L (ref 20–31)
Creat: 1.13 mg/dL — ABNORMAL HIGH (ref 0.60–0.93)
Glucose, Bld: 75 mg/dL (ref 65–99)
POTASSIUM: 5 mmol/L (ref 3.5–5.3)
SODIUM: 138 mmol/L (ref 135–146)
Total Bilirubin: 0.4 mg/dL (ref 0.2–1.2)
Total Protein: 8.2 g/dL — ABNORMAL HIGH (ref 6.1–8.1)

## 2015-07-30 LAB — LIPID PANEL
CHOL/HDL RATIO: 5 ratio (ref <=5.0)
CHOLESTEROL: 220 mg/dL — AB (ref 125–200)
HDL: 44 mg/dL — AB (ref >=46)
LDL CALC: 155 mg/dL — AB (ref <130)
TRIGLYCERIDES: 105 mg/dL (ref <150)
VLDL: 21 mg/dL (ref <30)

## 2015-08-10 DIAGNOSIS — H25813 Combined forms of age-related cataract, bilateral: Secondary | ICD-10-CM | POA: Diagnosis not present

## 2015-08-10 DIAGNOSIS — H43813 Vitreous degeneration, bilateral: Secondary | ICD-10-CM | POA: Diagnosis not present

## 2015-08-10 DIAGNOSIS — H52203 Unspecified astigmatism, bilateral: Secondary | ICD-10-CM | POA: Diagnosis not present

## 2015-08-18 ENCOUNTER — Telehealth: Payer: Self-pay | Admitting: Cardiovascular Disease

## 2015-08-18 NOTE — Telephone Encounter (Signed)
She may hold her Eliquis for 36 hours prior to cataract procedure

## 2015-08-18 NOTE — Telephone Encounter (Signed)
Clearance placed in HIM for faxing 

## 2015-08-18 NOTE — Telephone Encounter (Signed)
What  office are you calling from?  Ophthamology  1. What is your office phone and fax number? [phone])  2. What type of procedure is the patient having performed? Dr. Shon Hough   3. What date is procedure scheduled? Cataract surgery  4/6  4. What is your question (ex. Antibiotics prior to procedure, holding medication-we need to know how long dentist wants pt to hold med)? Eliquis  5.

## 2015-08-23 DIAGNOSIS — H25041 Posterior subcapsular polar age-related cataract, right eye: Secondary | ICD-10-CM | POA: Diagnosis not present

## 2015-08-23 DIAGNOSIS — H25811 Combined forms of age-related cataract, right eye: Secondary | ICD-10-CM | POA: Diagnosis not present

## 2015-08-23 DIAGNOSIS — H21261 Iris atrophy (essential) (progressive), right eye: Secondary | ICD-10-CM | POA: Diagnosis not present

## 2015-08-23 DIAGNOSIS — H409 Unspecified glaucoma: Secondary | ICD-10-CM | POA: Diagnosis not present

## 2015-08-23 DIAGNOSIS — H21511 Anterior synechiae (iris), right eye: Secondary | ICD-10-CM | POA: Diagnosis not present

## 2015-08-23 DIAGNOSIS — H25011 Cortical age-related cataract, right eye: Secondary | ICD-10-CM | POA: Diagnosis not present

## 2015-08-23 DIAGNOSIS — H2511 Age-related nuclear cataract, right eye: Secondary | ICD-10-CM | POA: Diagnosis not present

## 2015-09-05 DIAGNOSIS — Q132 Other congenital malformations of iris: Secondary | ICD-10-CM | POA: Diagnosis not present

## 2015-09-05 DIAGNOSIS — H4089 Other specified glaucoma: Secondary | ICD-10-CM | POA: Diagnosis not present

## 2015-09-05 DIAGNOSIS — H16321 Diffuse interstitial keratitis, right eye: Secondary | ICD-10-CM | POA: Diagnosis not present

## 2015-09-05 DIAGNOSIS — H21261 Iris atrophy (essential) (progressive), right eye: Secondary | ICD-10-CM | POA: Diagnosis not present

## 2015-09-05 DIAGNOSIS — H4050X0 Glaucoma secondary to other eye disorders, unspecified eye, stage unspecified: Secondary | ICD-10-CM | POA: Diagnosis not present

## 2015-09-11 ENCOUNTER — Encounter (HOSPITAL_COMMUNITY): Payer: Self-pay | Admitting: Emergency Medicine

## 2015-09-11 ENCOUNTER — Emergency Department (HOSPITAL_COMMUNITY): Payer: Medicare Other

## 2015-09-11 ENCOUNTER — Emergency Department (HOSPITAL_COMMUNITY)
Admission: EM | Admit: 2015-09-11 | Discharge: 2015-09-11 | Disposition: A | Discharge status: Home / Self Care | Payer: Medicare Other | Attending: Emergency Medicine | Admitting: Emergency Medicine

## 2015-09-11 DIAGNOSIS — Z79899 Other long term (current) drug therapy: Secondary | ICD-10-CM | POA: Insufficient documentation

## 2015-09-11 DIAGNOSIS — F419 Anxiety disorder, unspecified: Secondary | ICD-10-CM | POA: Insufficient documentation

## 2015-09-11 DIAGNOSIS — M7989 Other specified soft tissue disorders: Secondary | ICD-10-CM | POA: Diagnosis not present

## 2015-09-11 DIAGNOSIS — Y9289 Other specified places as the place of occurrence of the external cause: Secondary | ICD-10-CM | POA: Diagnosis not present

## 2015-09-11 DIAGNOSIS — Z8669 Personal history of other diseases of the nervous system and sense organs: Secondary | ICD-10-CM | POA: Diagnosis not present

## 2015-09-11 DIAGNOSIS — M791 Myalgia: Secondary | ICD-10-CM | POA: Diagnosis not present

## 2015-09-11 DIAGNOSIS — I2510 Atherosclerotic heart disease of native coronary artery without angina pectoris: Secondary | ICD-10-CM | POA: Diagnosis not present

## 2015-09-11 DIAGNOSIS — Z8742 Personal history of other diseases of the female genital tract: Secondary | ICD-10-CM | POA: Diagnosis not present

## 2015-09-11 DIAGNOSIS — Z9889 Other specified postprocedural states: Secondary | ICD-10-CM | POA: Insufficient documentation

## 2015-09-11 DIAGNOSIS — Y998 Other external cause status: Secondary | ICD-10-CM | POA: Insufficient documentation

## 2015-09-11 DIAGNOSIS — M7918 Myalgia, other site: Secondary | ICD-10-CM

## 2015-09-11 DIAGNOSIS — E669 Obesity, unspecified: Secondary | ICD-10-CM | POA: Diagnosis not present

## 2015-09-11 DIAGNOSIS — M25532 Pain in left wrist: Secondary | ICD-10-CM | POA: Diagnosis not present

## 2015-09-11 DIAGNOSIS — Y9389 Activity, other specified: Secondary | ICD-10-CM | POA: Diagnosis not present

## 2015-09-11 DIAGNOSIS — I10 Essential (primary) hypertension: Secondary | ICD-10-CM | POA: Insufficient documentation

## 2015-09-11 DIAGNOSIS — Z87891 Personal history of nicotine dependence: Secondary | ICD-10-CM | POA: Insufficient documentation

## 2015-09-11 DIAGNOSIS — S6992XA Unspecified injury of left wrist, hand and finger(s), initial encounter: Secondary | ICD-10-CM | POA: Insufficient documentation

## 2015-09-11 DIAGNOSIS — M797 Fibromyalgia: Secondary | ICD-10-CM | POA: Insufficient documentation

## 2015-09-11 DIAGNOSIS — K219 Gastro-esophageal reflux disease without esophagitis: Secondary | ICD-10-CM | POA: Diagnosis not present

## 2015-09-11 DIAGNOSIS — W108XXA Fall (on) (from) other stairs and steps, initial encounter: Secondary | ICD-10-CM | POA: Insufficient documentation

## 2015-09-11 DIAGNOSIS — M1990 Unspecified osteoarthritis, unspecified site: Secondary | ICD-10-CM | POA: Insufficient documentation

## 2015-09-11 NOTE — Discharge Instructions (Signed)
Please read and follow all provided instructions.  Your diagnoses today include:  1. Left wrist pain   2. Musculoskeletal pain    Tests performed today include:  Vital signs. See below for your results today.   Medications prescribed:   None  Home care instructions:  Follow any educational materials contained in this packet.  Follow-up instructions: Please follow-up with your primary care provider in the next week for further evaluation of symptoms and treatment   Return instructions:   Please return to the Emergency Department if you do not get better, if you get worse, or new symptoms OR  - Fever (temperature greater than 101.63F)  - Bleeding that does not stop with holding pressure to the area    -Severe pain (please note that you may be more sore the day after your accident)  - Chest Pain  - Difficulty breathing  - Severe nausea or vomiting  - Inability to tolerate food and liquids  - Passing out  - Skin becoming red around your wounds  - Change in mental status (confusion or lethargy)  - New numbness or weakness     Please return if you have any other emergent concerns.  Additional Information:  Your vital signs today were: BP 164/58 mmHg   Pulse 77   Temp(Src) 97.5 F (36.4 C) (Oral)   Resp 18   Ht 5\' 6"  (1.676 m)   Wt 102.059 kg   BMI 36.33 kg/m2   SpO2 96% If your blood pressure (BP) was elevated above 135/85 this visit, please have this repeated by your doctor within one month. ---------------

## 2015-09-11 NOTE — ED Notes (Signed)
Pt. missed her step and fell this afternoon , denies LOC / ambulatory , reports pain with mild swelling at left wrist .

## 2015-09-11 NOTE — ED Provider Notes (Signed)
CSN: [id number]     Arrival date & time 09/11/15  2112 History  By signing my name below, I, [name], attest that this documentation has been prepared under the direction and in the presence of Claudia Decamp, PA-C. Electronically Signed: Hansel Roberts, ED Scribe. 09/11/2015. 9:44 PM.    Chief Complaint  Patient presents with  . Wrist Injury   The history is provided by the patient. No language interpreter was used.   HPI Comments: [name] is a 75 y.o. female who presents to the Emergency Department complaining of moderate, worsening, constant 5/10 left wrist pain and swelling s/p mechanical fall that occurred this afternoon. Pt states that she missed a step while walking down the stairs, fell forward and landed onto her bilateral knees and outstretched left wrist. She is able to flex and extend the wrist and fingers, but with pain. Pt states moderate relief of pain with acetaminophen. Denies numbness, additional injuries.    Past Medical History  Diagnosis Date  . Hyperlipidemia   . Fatigue   . Anxiety   . Coronary artery disease     a. s/p MI 2009 - PCI/DES distal  RCA w/ 2.75 x 18 Xience DES;  b. 05/2010 Myoview: Apical thinning, EF 73%;  c. 06/2010 Cath: moderate nonobs dzs, EF 65%;  d.  Lexiscan Myoview (07/2013):  No scar or ischemia, EF 67%; Normal Study  . Fibromyalgia   . Essential iris atrophy     Right side  . Menopause   . Glaucoma   . Environmental allergies   . GERD (gastroesophageal reflux disease)   . Osteoarthritis   . OSA (obstructive sleep apnea)   . Obesity (BMI 30-39.9)   . Hypertension    Past Surgical History  Procedure Laterality Date  . Cardiac catheterization  06/29/2010    Mild to moderate coronary artery irregularities.  Her  proximal left anterior descending artery is moderately narrowed.  She  also has an eccentric stenosis in the proximal LAD.  These do not appear  to obstruct flow at present, but they are fairly small vessels.  They  appeared to be  between 1.5 and 2 mm in diamete  . Cardiac catheterization  08/02/2009    Patent stent  . Cardiac catheterization  05/12/2008    Stent to the distal RCA   Family History  Problem Relation Age of Onset  . Heart attack Mother   . Coronary artery disease Brother     had CABG  . Coronary artery disease Brother   . Coronary artery disease Brother   . Coronary artery disease Brother   . Coronary artery disease Brother   . Heart attack Brother   . Cancer Father    Social History  Substance Use Topics  . Smoking status: Former Smoker    Types: Cigarettes    Quit date: 09/13/1960  . Smokeless tobacco: Never Used  . Alcohol Use: No   OB History    No data available     Review of Systems A complete 10 system review of systems was obtained and all systems are negative except as noted in the HPI and PMH.    Allergies  Codeine; Cortisone; and Sulfamethoxazole-trimethoprim  Home Medications   Prior to Admission medications   Medication Sig Start Date End Date Taking? Authorizing Provider  acetaminophen-codeine (TYLENOL #3) 300-30 MG tablet Take by mouth every 4 (four) hours as needed for moderate pain.    Historical Provider, MD  apixaban (ELIQUIS) 5 MG  TABS tablet Take 1 tablet (5 mg total) by mouth 2 (two) times daily. 12/21/14   Thayer Headings, MD  CARTIA XT 120 MG 24 hr capsule Take 120 mg by mouth daily. 12/20/14   Historical Provider, MD  ferrous sulfate 325 (65 FE) MG tablet Take 325 mg by mouth every morning.     Historical Provider, MD  hydrochlorothiazide (HYDRODIURIL) 25 MG tablet Take 1 tablet (25 mg total) by mouth daily. 08/17/14   Thayer Headings, MD  lisinopril (PRINIVIL,ZESTRIL) 40 MG tablet Take 1 tablet (40 mg total) by mouth every morning. 03/01/15   Thayer Headings, MD  nitrofurantoin (MACRODANTIN) 100 MG capsule Take 100 mg by mouth daily.  12/22/13   Historical Provider, MD  nitroGLYCERIN (NITROSTAT) 0.4 MG SL tablet Place 1 tablet (0.4 mg total) under the tongue every  5 (five) minutes x 3 doses as needed for chest pain. For chest pain Patient not taking: Reported on 07/29/2015 12/29/14   Thayer Headings, MD  pantoprazole (PROTONIX) 40 MG tablet Take 40 mg by mouth daily.  01/07/14   Historical Provider, MD  potassium chloride (K-DUR) 10 MEQ tablet TAKE 1 TABLET BY MOUTH DAILY Patient taking differently: TAKE 10 MEQ BY MOUTH DAILY 01/06/15   Thayer Headings, MD  pramipexole (MIRAPEX) 1 MG tablet Take 1 mg by mouth at bedtime.     Historical Provider, MD  venlafaxine (EFFEXOR) 75 MG tablet Take 37.5 mg by mouth 2 (two) times daily.     Historical Provider, MD   BP 164/58 mmHg  Pulse 77  Temp(Src) 97.5 F (36.4 C) (Oral)  Resp 18  Ht 5\' 6"  (1.676 m)  Wt 225 lb (102.059 kg)  BMI 36.33 kg/m2  SpO2 96% Physical Exam  Constitutional: She is oriented to person, place, and time. She appears well-developed and well-nourished.  HENT:  Head: Normocephalic.  Eyes: Conjunctivae are normal.  Cardiovascular: Normal rate.   Pulmonary/Chest: Effort normal. No respiratory distress.  Abdominal: She exhibits no distension.  Musculoskeletal: Normal range of motion. She exhibits tenderness.  Tenderness along the distal radial and ulnar aspects of the left wrist. Capillary refill less than 3 seconds. Radial pulse 2+. No obvious deformity. No joint effusion. Able to flex and extend the wrist and fingers. Sensation equal and intact bilaterally. NVI.   Neurological: She is alert and oriented to person, place, and time.  Skin: Skin is warm and dry.  Psychiatric: She has a normal mood and affect. Her behavior is normal.  Nursing note and vitals reviewed.   ED Course  Procedures (including critical care time) DIAGNOSTIC STUDIES: Oxygen Saturation is 96% on RA, adequate by my interpretation.    COORDINATION OF CARE: 9:38 PM Discussed treatment plan with pt at bedside which includes XR and pt agreed to plan.   Imaging Review Dg Wrist Complete Left  09/11/2015  CLINICAL  DATA:  Status post fall, with left lateral and medial wrist pain and swelling. Initial encounter. EXAM: LEFT WRIST - COMPLETE 3+ VIEW COMPARISON:  Left wrist radiographs from 01/10/2015 FINDINGS: There is no evidence of fracture or dislocation. There is mild chronic deformity of the distal radius. The carpal rows are intact, and demonstrate normal alignment. The joint spaces are preserved. A chronic ulnar styloid fragment is noted. No significant soft tissue abnormalities are seen. IMPRESSION: No evidence of acute fracture or dislocation. Electronically Signed   By: Garald Balding M.D.   On: 09/11/2015 21:37   I have personally reviewed and evaluated these  images as part of my medical decision-making.   MDM  I have reviewed and evaluated the relevant imaging studies. I have reviewed the relevant previous healthcare records. I obtained HPI from historian.  ED Course:  Assessment: Patient X-Ray negative for obvious fracture or dislocation.  Pt advised to follow up with PCP as needed if symptoms do not improve. Patient given brace while in ED, conservative therapy recommended and discussed. Patient will be discharged home & is agreeable with above plan. Returns precautions discussed. Pt appears safe for discharge.   Disposition/Plan:  DC home Additional Verbal discharge instructions given and discussed with patient.  Pt Instructed to f/u with PCP in the next week for evaluation and treatment of symptoms. Return precautions given Pt acknowledges and agrees with plan  Supervising Physician Tanna Furry, MD   Final diagnoses:  Left wrist pain  Musculoskeletal pain    I personally performed the services described in this documentation, which was scribed in my presence. The recorded information has been reviewed and is accurate.   Claudia Decamp, PA-C 09/11/15 2323  Tanna Furry, MD 09/24/15 [phone]

## 2015-09-19 ENCOUNTER — Encounter (HOSPITAL_COMMUNITY): Payer: Self-pay | Admitting: Emergency Medicine

## 2015-09-19 ENCOUNTER — Inpatient Hospital Stay (HOSPITAL_COMMUNITY)
Admission: EM | Admit: 2015-09-19 | Discharge: 2015-09-30 | DRG: 229 | Disposition: A | Discharge status: Skilled Nursing Facility | Payer: Medicare Other | Attending: Cardiothoracic Surgery | Admitting: Cardiothoracic Surgery

## 2015-09-19 ENCOUNTER — Emergency Department (HOSPITAL_COMMUNITY): Payer: Medicare Other

## 2015-09-19 DIAGNOSIS — E785 Hyperlipidemia, unspecified: Secondary | ICD-10-CM | POA: Diagnosis present

## 2015-09-19 DIAGNOSIS — I4891 Unspecified atrial fibrillation: Secondary | ICD-10-CM | POA: Diagnosis present

## 2015-09-19 DIAGNOSIS — Z955 Presence of coronary angioplasty implant and graft: Secondary | ICD-10-CM

## 2015-09-19 DIAGNOSIS — I2510 Atherosclerotic heart disease of native coronary artery without angina pectoris: Secondary | ICD-10-CM | POA: Diagnosis not present

## 2015-09-19 DIAGNOSIS — H409 Unspecified glaucoma: Secondary | ICD-10-CM | POA: Diagnosis present

## 2015-09-19 DIAGNOSIS — Z9119 Patient's noncompliance with other medical treatment and regimen: Secondary | ICD-10-CM

## 2015-09-19 DIAGNOSIS — I480 Paroxysmal atrial fibrillation: Secondary | ICD-10-CM

## 2015-09-19 DIAGNOSIS — H2129 Other iris atrophy: Secondary | ICD-10-CM | POA: Diagnosis present

## 2015-09-19 DIAGNOSIS — Z09 Encounter for follow-up examination after completed treatment for conditions other than malignant neoplasm: Secondary | ICD-10-CM

## 2015-09-19 DIAGNOSIS — I10 Essential (primary) hypertension: Secondary | ICD-10-CM | POA: Diagnosis present

## 2015-09-19 DIAGNOSIS — Z951 Presence of aortocoronary bypass graft: Secondary | ICD-10-CM

## 2015-09-19 DIAGNOSIS — R079 Chest pain, unspecified: Secondary | ICD-10-CM

## 2015-09-19 DIAGNOSIS — D62 Acute posthemorrhagic anemia: Secondary | ICD-10-CM | POA: Diagnosis not present

## 2015-09-19 DIAGNOSIS — Z6838 Body mass index (BMI) 38.0-38.9, adult: Secondary | ICD-10-CM

## 2015-09-19 DIAGNOSIS — I499 Cardiac arrhythmia, unspecified: Secondary | ICD-10-CM | POA: Diagnosis not present

## 2015-09-19 DIAGNOSIS — Z8249 Family history of ischemic heart disease and other diseases of the circulatory system: Secondary | ICD-10-CM

## 2015-09-19 DIAGNOSIS — K219 Gastro-esophageal reflux disease without esophagitis: Secondary | ICD-10-CM | POA: Diagnosis present

## 2015-09-19 DIAGNOSIS — G4733 Obstructive sleep apnea (adult) (pediatric): Secondary | ICD-10-CM | POA: Diagnosis present

## 2015-09-19 DIAGNOSIS — K5900 Constipation, unspecified: Secondary | ICD-10-CM | POA: Diagnosis not present

## 2015-09-19 DIAGNOSIS — F419 Anxiety disorder, unspecified: Secondary | ICD-10-CM | POA: Diagnosis present

## 2015-09-19 DIAGNOSIS — Z87891 Personal history of nicotine dependence: Secondary | ICD-10-CM

## 2015-09-19 DIAGNOSIS — E871 Hypo-osmolality and hyponatremia: Secondary | ICD-10-CM | POA: Diagnosis not present

## 2015-09-19 DIAGNOSIS — E8770 Fluid overload, unspecified: Secondary | ICD-10-CM | POA: Diagnosis not present

## 2015-09-19 DIAGNOSIS — Z79899 Other long term (current) drug therapy: Secondary | ICD-10-CM

## 2015-09-19 DIAGNOSIS — I517 Cardiomegaly: Secondary | ICD-10-CM | POA: Diagnosis not present

## 2015-09-19 DIAGNOSIS — Z888 Allergy status to other drugs, medicaments and biological substances status: Secondary | ICD-10-CM

## 2015-09-19 DIAGNOSIS — Z7901 Long term (current) use of anticoagulants: Secondary | ICD-10-CM

## 2015-09-19 DIAGNOSIS — E669 Obesity, unspecified: Secondary | ICD-10-CM | POA: Diagnosis present

## 2015-09-19 DIAGNOSIS — M797 Fibromyalgia: Secondary | ICD-10-CM | POA: Diagnosis present

## 2015-09-19 DIAGNOSIS — R072 Precordial pain: Secondary | ICD-10-CM | POA: Diagnosis not present

## 2015-09-19 DIAGNOSIS — I209 Angina pectoris, unspecified: Secondary | ICD-10-CM | POA: Insufficient documentation

## 2015-09-19 LAB — CBC WITH DIFFERENTIAL/PLATELET
BASOS ABS: 0 10*3/uL (ref 0.0–0.1)
Basophils Relative: 1 %
Eosinophils Absolute: 0.2 10*3/uL (ref 0.0–0.7)
Eosinophils Relative: 4 %
HCT: 34.2 % — ABNORMAL LOW (ref 36.0–46.0)
HEMOGLOBIN: 11.2 g/dL — AB (ref 12.0–15.0)
LYMPHS ABS: 1.7 10*3/uL (ref 0.7–4.0)
LYMPHS PCT: 29 %
MCH: 29.6 pg (ref 26.0–34.0)
MCHC: 32.7 g/dL (ref 30.0–36.0)
MCV: 90.5 fL (ref 78.0–100.0)
Monocytes Absolute: 0.5 10*3/uL (ref 0.1–1.0)
Monocytes Relative: 9 %
NEUTROS ABS: 3.3 10*3/uL (ref 1.7–7.7)
NEUTROS PCT: 57 %
PLATELETS: 195 10*3/uL (ref 150–400)
RBC: 3.78 MIL/uL — AB (ref 3.87–5.11)
RDW: 14.9 % (ref 11.5–15.5)
WBC: 5.8 10*3/uL (ref 4.0–10.5)

## 2015-09-19 LAB — I-STAT TROPONIN, ED: Troponin i, poc: 0.01 ng/mL (ref 0.00–0.08)

## 2015-09-19 LAB — BASIC METABOLIC PANEL
ANION GAP: 9 (ref 5–15)
BUN: 23 mg/dL — ABNORMAL HIGH (ref 6–20)
CHLORIDE: 109 mmol/L (ref 101–111)
CO2: 21 mmol/L — ABNORMAL LOW (ref 22–32)
Calcium: 9.3 mg/dL (ref 8.9–10.3)
Creatinine, Ser: 1.12 mg/dL — ABNORMAL HIGH (ref 0.44–1.00)
GFR, EST AFRICAN AMERICAN: 55 mL/min — AB (ref >60)
GFR, EST NON AFRICAN AMERICAN: 48 mL/min — AB (ref >60)
Glucose, Bld: 129 mg/dL — ABNORMAL HIGH (ref 65–99)
POTASSIUM: 3.8 mmol/L (ref 3.5–5.1)
SODIUM: 139 mmol/L (ref 135–145)

## 2015-09-19 LAB — APTT
aPTT: 34 seconds (ref 24–37)
aPTT: 60 s — ABNORMAL HIGH (ref 24–37)

## 2015-09-19 LAB — TROPONIN I
Troponin I: 0.06 ng/mL — ABNORMAL HIGH
Troponin I: 0.06 ng/mL — ABNORMAL HIGH (ref <0.031)

## 2015-09-19 LAB — T4, FREE: Free T4: 0.74 ng/dL (ref 0.61–1.12)

## 2015-09-19 LAB — TSH: TSH: 4.769 u[IU]/mL — AB (ref 0.350–4.500)

## 2015-09-19 LAB — BRAIN NATRIURETIC PEPTIDE: B NATRIURETIC PEPTIDE 5: 46.8 pg/mL (ref 0.0–100.0)

## 2015-09-19 LAB — HEPARIN LEVEL (UNFRACTIONATED): Heparin Unfractionated: 1.54 IU/mL — ABNORMAL HIGH (ref 0.30–0.70)

## 2015-09-19 MED ORDER — NITROFURANTOIN MACROCRYSTAL 100 MG PO CAPS
100.0000 mg | ORAL_CAPSULE | Freq: Every day | ORAL | Status: DC
  Administered 2015-09-20: 100 mg via ORAL
  Filled 2015-09-19 (×2): qty 1

## 2015-09-19 MED ORDER — PREDNISOLONE ACETATE 1 % OP SUSP
1.0000 [drp] | Freq: Four times a day (QID) | OPHTHALMIC | Status: DC
  Administered 2015-09-19 – 2015-09-30 (×39): 1 [drp] via OPHTHALMIC
  Filled 2015-09-19 (×3): qty 1

## 2015-09-19 MED ORDER — VENLAFAXINE HCL 37.5 MG PO TABS
37.5000 mg | ORAL_TABLET | Freq: Two times a day (BID) | ORAL | Status: DC
  Administered 2015-09-19 – 2015-09-30 (×19): 37.5 mg via ORAL
  Filled 2015-09-19 (×25): qty 1

## 2015-09-19 MED ORDER — ONDANSETRON HCL 4 MG/2ML IJ SOLN: 4.0000 mg | Freq: Four times a day (QID) | INTRAMUSCULAR | Status: DC | PRN

## 2015-09-19 MED ORDER — PANTOPRAZOLE SODIUM 40 MG PO TBEC
40.0000 mg | DELAYED_RELEASE_TABLET | Freq: Every day | ORAL | Status: DC
  Administered 2015-09-19 – 2015-09-22 (×4): 40 mg via ORAL
  Filled 2015-09-19 (×4): qty 1

## 2015-09-19 MED ORDER — POTASSIUM CHLORIDE CRYS ER 10 MEQ PO TBCR
10.0000 meq | EXTENDED_RELEASE_TABLET | Freq: Every day | ORAL | Status: DC
  Administered 2015-09-19 – 2015-09-22 (×4): 10 meq via ORAL
  Filled 2015-09-19 (×5): qty 1

## 2015-09-19 MED ORDER — HEPARIN (PORCINE) IN NACL 100-0.45 UNIT/ML-% IJ SOLN
1350.0000 [IU]/h | INTRAMUSCULAR | Status: DC
  Administered 2015-09-19: 1200 [IU]/h via INTRAVENOUS
  Filled 2015-09-19 (×2): qty 250

## 2015-09-19 MED ORDER — NITROGLYCERIN 0.4 MG SL SUBL
0.4000 mg | SUBLINGUAL_TABLET | SUBLINGUAL | Status: DC | PRN
  Administered 2015-09-22 (×2): 0.4 mg via SUBLINGUAL
  Filled 2015-09-19: qty 1

## 2015-09-19 MED ORDER — TIMOLOL MALEATE 0.5 % OP SOLN
1.0000 [drp] | Freq: Every day | OPHTHALMIC | Status: DC
  Administered 2015-09-19 – 2015-09-28 (×9): 1 [drp] via OPHTHALMIC
  Filled 2015-09-19 (×2): qty 5

## 2015-09-19 MED ORDER — HYDROCHLOROTHIAZIDE 25 MG PO TABS
25.0000 mg | ORAL_TABLET | Freq: Every day | ORAL | Status: DC
  Administered 2015-09-19 – 2015-09-22 (×3): 25 mg via ORAL
  Filled 2015-09-19 (×3): qty 1

## 2015-09-19 MED ORDER — FUROSEMIDE 10 MG/ML IJ SOLN
40.0000 mg | Freq: Once | INTRAMUSCULAR | Status: AC
  Administered 2015-09-19: 40 mg via INTRAVENOUS
  Filled 2015-09-19: qty 4

## 2015-09-19 MED ORDER — DILTIAZEM HCL ER COATED BEADS 120 MG PO CP24
120.0000 mg | ORAL_CAPSULE | Freq: Every day | ORAL | Status: DC
  Administered 2015-09-19 – 2015-09-22 (×4): 120 mg via ORAL
  Filled 2015-09-19 (×4): qty 1

## 2015-09-19 MED ORDER — ACETAMINOPHEN 325 MG PO TABS
650.0000 mg | ORAL_TABLET | Freq: Once | ORAL | Status: AC
  Administered 2015-09-19: 650 mg via ORAL
  Filled 2015-09-19: qty 2

## 2015-09-19 MED ORDER — ACETAMINOPHEN 325 MG PO TABS: 650.0000 mg | ORAL_TABLET | ORAL | Status: DC | PRN

## 2015-09-19 MED ORDER — PRAMIPEXOLE DIHYDROCHLORIDE 1 MG PO TABS
1.0000 mg | ORAL_TABLET | Freq: Every day | ORAL | Status: DC
  Administered 2015-09-19 – 2015-09-22 (×4): 1 mg via ORAL
  Filled 2015-09-19 (×5): qty 1

## 2015-09-19 MED ORDER — NITROGLYCERIN 2 % TD OINT
1.0000 [in_us] | TOPICAL_OINTMENT | Freq: Once | TRANSDERMAL | Status: AC
  Administered 2015-09-19: 1 [in_us] via TOPICAL
  Filled 2015-09-19: qty 1

## 2015-09-19 MED ORDER — LISINOPRIL 20 MG PO TABS
40.0000 mg | ORAL_TABLET | Freq: Every morning | ORAL | Status: DC
  Administered 2015-09-19 – 2015-09-22 (×4): 40 mg via ORAL
  Filled 2015-09-19: qty 2
  Filled 2015-09-19 (×3): qty 1

## 2015-09-19 NOTE — Progress Notes (Signed)
ANTICOAGULATION CONSULT NOTE - Initial Consult  Pharmacy Consult for heparin Indication: atrial fibrillation  Allergies  Allergen Reactions  . Cortisone Nausea Only and Other (See Comments)    Pt gets very hot and flushed  . Sulfamethoxazole-Trimethoprim Other (See Comments)    Reaction: unknown    Patient Measurements: Height: 5\' 6"  (167.6 cm) Weight: 225 lb (102.059 kg) IBW/kg (Calculated) : 59.3 Heparin Dosing Weight: 82.5kg  Vital Signs: Temp: 98 F (36.7 C) (05/01 0419) Temp Source: Oral (05/01 0419) BP: 164/50 mmHg (05/01 1015) Pulse Rate: 77 (05/01 1015)  Labs:  Recent Labs  09/19/15 0432  HGB 11.2*  HCT 34.2*  PLT 195  CREATININE 1.12*    Estimated Creatinine Clearance: 54 mL/min (by C-G formula based on Cr of 1.12).   Medical History: Past Medical History  Diagnosis Date  . Hyperlipidemia   . Fatigue   . Anxiety   . Coronary artery disease     a. s/p MI 2009 - PCI/DES distal  RCA w/ 2.75 x 18 Xience DES;  b. 05/2010 Myoview: Apical thinning, EF 73%;  c. 06/2010 Cath: moderate nonobs dzs, EF 65%;  d.  Lexiscan Myoview (07/2013):  No scar or ischemia, EF 67%; Normal Study  . Fibromyalgia   . Essential iris atrophy     Right side  . Menopause   . Glaucoma   . Environmental allergies   . GERD (gastroesophageal reflux disease)   . Osteoarthritis   . OSA (obstructive sleep apnea)   . Obesity (BMI 30-39.9)   . Hypertension     Medications:  Infusions:  . heparin     Assessment: 9 yof presented to the ED with chest pain and found to be in afib. She is on chronic apixaban for afib with last dose yesterday. H/H is slightly low and platelets are WNL. To transition to IV heparin.   Goal of Therapy:  Heparin level 0.3-0.7 units/ml aPTT 66-102 seconds Monitor platelets by anticoagulation protocol: Yes   Plan:  - Check a baseline heparin level and aPTT - Start heparin gtt 1200 units/hr - Check an 8 hour aPTT - Daily aPTT, heparin level and CBC -  F/u ability to restart apixaban  Jayvyn Haselton, Rande Lawman 09/19/2015,12:43 PM

## 2015-09-19 NOTE — ED Provider Notes (Signed)
CSN: [id number]     Arrival date & time 09/19/15  0411 History   First MD Initiated Contact with Patient 09/19/15 [phone]     Chief Complaint  Patient presents with  . Chest Pain  . Atrial Fibrillation     Patient is a 75 y.o. female presenting with chest pain and atrial fibrillation. The history is provided by the patient.  Chest Pain Pain location:  Substernal area Pain quality: burning   Pain radiates to:  Neck Pain severity:  Moderate Onset quality:  Gradual Duration:  30 minutes Timing:  Constant Progression:  Improving Chronicity:  New Worsened by:  Nothing tried Associated symptoms: cough and shortness of breath   Associated symptoms: no abdominal pain, no fever, no lower extremity edema and no syncope   Atrial Fibrillation Associated symptoms include chest pain and shortness of breath. Pertinent negatives include no abdominal pain.  Patient with h/o CAD, h/o paroxysmal afib on cardizem/eliquis presents with episodes of chest burning and palpitations She reports going to bed feeling well but started to have chest burning/sob.  She took ASA/NTG with minimal improvement and she called EMS EMS found that she was in afib but then she later spontaneously converted to sinus rhythm She is now feeling improved She reports recent viral illness with fever/cough but that is improved  She denies increased orthopnea or increased DOE  Past Medical History  Diagnosis Date  . Hyperlipidemia   . Fatigue   . Anxiety   . Coronary artery disease     a. s/p MI 2009 - PCI/DES distal  RCA w/ 2.75 x 18 Xience DES;  b. 05/2010 Myoview: Apical thinning, EF 73%;  c. 06/2010 Cath: moderate nonobs dzs, EF 65%;  d.  Lexiscan Myoview (07/2013):  No scar or ischemia, EF 67%; Normal Study  . Fibromyalgia   . Essential iris atrophy     Right side  . Menopause   . Glaucoma   . Environmental allergies   . GERD (gastroesophageal reflux disease)   . Osteoarthritis   . OSA (obstructive sleep apnea)   .  Obesity (BMI 30-39.9)   . Hypertension    Past Surgical History  Procedure Laterality Date  . Cardiac catheterization  06/29/2010    Mild to moderate coronary artery irregularities.  Her  proximal left anterior descending artery is moderately narrowed.  She  also has an eccentric stenosis in the proximal LAD.  These do not appear  to obstruct flow at present, but they are fairly small vessels.  They  appeared to be between 1.5 and 2 mm in diamete  . Cardiac catheterization  08/02/2009    Patent stent  . Cardiac catheterization  05/12/2008    Stent to the distal RCA   Family History  Problem Relation Age of Onset  . Heart attack Mother   . Coronary artery disease Brother     had CABG  . Coronary artery disease Brother   . Coronary artery disease Brother   . Coronary artery disease Brother   . Coronary artery disease Brother   . Heart attack Brother   . Cancer Father    Social History  Substance Use Topics  . Smoking status: Former Smoker    Types: Cigarettes    Quit date: 09/13/1960  . Smokeless tobacco: Never Used  . Alcohol Use: No   OB History    No data available     Review of Systems  Constitutional: Negative for fever.  Respiratory: Positive for cough and shortness  of breath.   Cardiovascular: Positive for chest pain. Negative for syncope.  Gastrointestinal: Negative for abdominal pain.  Neurological: Negative for syncope.  All other systems reviewed and are negative.     Allergies  Cortisone and Sulfamethoxazole-trimethoprim  Home Medications   Prior to Admission medications   Medication Sig Start Date End Date Taking? Authorizing Provider  acetaminophen (TYLENOL) 500 MG tablet Take 1,000 mg by mouth every 8 (eight) hours as needed for moderate pain.   Yes Historical Provider, MD  apixaban (ELIQUIS) 5 MG TABS tablet Take 1 tablet (5 mg total) by mouth 2 (two) times daily. 12/21/14  Yes Thayer Headings, MD  CARTIA XT 120 MG 24 hr capsule Take 120 mg by mouth  daily. 12/20/14  Yes Historical Provider, MD  Cyanocobalamin (VITAMIN B-12 PO) Take 1 tablet by mouth daily.   Yes Historical Provider, MD  ferrous sulfate 325 (65 FE) MG tablet Take 325 mg by mouth every morning.    Yes Historical Provider, MD  hydrochlorothiazide (HYDRODIURIL) 25 MG tablet Take 1 tablet (25 mg total) by mouth daily. 08/17/14  Yes Thayer Headings, MD  lisinopril (PRINIVIL,ZESTRIL) 40 MG tablet Take 1 tablet (40 mg total) by mouth every morning. 03/01/15  Yes Thayer Headings, MD  nitrofurantoin (MACRODANTIN) 100 MG capsule Take 100 mg by mouth daily.  12/22/13  Yes Historical Provider, MD  pantoprazole (PROTONIX) 40 MG tablet Take 40 mg by mouth daily.  01/07/14  Yes Historical Provider, MD  potassium chloride (K-DUR) 10 MEQ tablet TAKE 1 TABLET BY MOUTH DAILY Patient taking differently: TAKE 10 MEQ BY MOUTH DAILY 01/06/15  Yes Thayer Headings, MD  pramipexole (MIRAPEX) 1 MG tablet Take 1 mg by mouth at bedtime.    Yes Historical Provider, MD  prednisoLONE acetate (PRED FORTE) 1 % ophthalmic suspension INSTILL 1 DROP TO OD QID. START AFTER SURGERY 08/18/15  Yes Historical Provider, MD  timolol (TIMOPTIC) 0.5 % ophthalmic solution INSTILL 1 DROP OD QAM 08/31/15  Yes Historical Provider, MD  venlafaxine (EFFEXOR) 75 MG tablet Take 37.5 mg by mouth 2 (two) times daily.    Yes Historical Provider, MD  nitroGLYCERIN (NITROSTAT) 0.4 MG SL tablet Place 1 tablet (0.4 mg total) under the tongue every 5 (five) minutes x 3 doses as needed for chest pain. For chest pain Patient not taking: Reported on 07/29/2015 12/29/14   Thayer Headings, MD   BP 165/66 mmHg  Pulse 86  Temp(Src) 98 F (36.7 C) (Oral)  Resp 22  Ht 5\' 6"  (1.676 m)  Wt 102.059 kg  BMI 36.33 kg/m2  SpO2 99% Physical Exam CONSTITUTIONAL: Well developed/well nourished HEAD: Normocephalic/atraumatic EYES: EOMI/PERRL ENMT: Mucous membranes moist NECK: supple no meningeal signs SPINE/BACK:entire spine nontender CV: [id number] noted,  systolic murmur noted LUNGS: Lungs are clear to auscultation bilaterally, no apparent distress ABDOMEN: soft, nontender, no rebound or guarding, bowel sounds noted throughout abdomen GU:no cva tenderness NEURO: Pt is awake/alert/appropriate, moves all extremitiesx4.  No facial droop.   EXTREMITIES: pulses normal/equal, full ROM, no significant LE edema noted SKIN: warm, color normal PSYCH: no abnormalities of mood noted, alert and oriented to situation  ED Course  Procedures  Medications  furosemide (LASIX) injection 40 mg (40 mg Intravenous Given 09/19/15 0642)  nitroGLYCERIN (NITROGLYN) 2 % ointment 1 inch (1 inch Topical Given 09/19/15 [id number])    5:56 AM Per records pt with h/o paroxysmal afib, also h/o CAD Per last echo, h/o mild AS but EF is around 65% CXR shows  evidence of pulmonary edema NTG/lasix ordered She is currently in sinus (EMS EKG from 0349 reveals afib) 7:46 AM Pt stable No recurrent CP thus far in the ED bnp negative, but CXR appeared to have some pulmonary edema However, given h/o CAD with chest burning/sob this morning will consult cardiology Pt reports this episode was worse than prior episodes  Labs Review Labs Reviewed  BASIC METABOLIC PANEL - Abnormal; Notable for the following:    CO2 21 (*)    Glucose, Bld 129 (*)    BUN 23 (*)    Creatinine, Ser 1.12 (*)    GFR calc non Af Amer 48 (*)    GFR calc Af Amer 55 (*)    All other components within normal limits  CBC WITH DIFFERENTIAL/PLATELET - Abnormal; Notable for the following:    RBC 3.78 (*)    Hemoglobin 11.2 (*)    HCT 34.2 (*)    All other components within normal limits  BRAIN NATRIURETIC PEPTIDE  CBC  I-STAT TROPOININ, ED  I-STAT TROPOININ, ED    Imaging Review Dg Chest 2 View  09/19/2015  CLINICAL DATA:  Atrial fibrillation tonight with chest burning and discomfort. EXAM: CHEST  2 VIEW COMPARISON:  12/19/2014 FINDINGS: Shallow inspiration with atelectasis in the lung bases. Cardiac  enlargement with mild pulmonary vascular congestion. Developing interstitial changes in the lungs likely representing edema. No blunting of costophrenic angles. No pneumothorax. Calcification of the aorta. IMPRESSION: Cardiac enlargement with mild pulmonary vascular congestion and developing interstitial edema. Electronically Signed   By: Lucienne Capers M.D.   On: 09/19/2015 05:02   I have personally reviewed and evaluated these images and lab results as part of my medical decision-making.   EKG Interpretation   Date/Time:  Monday Sep 19 2015 04:18:33 EDT Ventricular Rate:  86 PR Interval:  170 QRS Duration: 80 QT Interval:  374 QTC Calculation: 447 R Axis:   53 Text Interpretation:  Normal sinus rhythm Cannot rule out Anterior infarct  , age undetermined Abnormal ECG Confirmed by Christy Gentles  MD, Clara Herbison ([id number])  on 09/19/2015 4:25:48 AM       MDM   Final diagnoses:  Paroxysmal atrial fibrillation (HCC)  Chest pain, rule out acute myocardial infarction    Nursing notes including past medical history and social history reviewed and considered in documentation xrays/imaging reviewed by myself and considered during evaluation Labs/vital reviewed myself and considered during evaluation; Previous records reviewed and considered - h/lo CAD per chart     Ripley Fraise, MD 09/19/15 [phone]

## 2015-09-19 NOTE — Progress Notes (Addendum)
ANTICOAGULATION CONSULT NOTE - Follow Up Consult  Pharmacy Consult for heparin Indication: atrial fibrillation  Allergies  Allergen Reactions  . Cortisone Nausea Only and Other (See Comments)    Pt gets very hot and flushed  . Sulfamethoxazole-Trimethoprim Other (See Comments)    Reaction: unknown    Patient Measurements: Height: 5\' 6"  (167.6 cm) Weight: 227 lb 12.8 oz (103.329 kg) IBW/kg (Calculated) : 59.3 Heparin Dosing Weight: 82.9 kg  Vital Signs: Temp: 97.6 F (36.4 C) (05/01 1929) Temp Source: Oral (05/01 1929) BP: 145/51 mmHg (05/01 1929) Pulse Rate: 65 (05/01 1929)  Labs:  Recent Labs  09/19/15 0432 09/19/15 1155 09/19/15 1235 09/19/15 1236 09/19/15 1804 09/19/15 2102  HGB 11.2*  --   --   --   --   --   HCT 34.2*  --   --   --   --   --   PLT 195  --   --   --   --   --   APTT  --   --  34  --   --  60*  HEPARINUNFRC  --   --   --  1.54*  --   --   CREATININE 1.12*  --   --   --   --   --   TROPONINI  --  0.06*  --   --  0.06*  --     Estimated Creatinine Clearance: 54.3 mL/min (by C-G formula based on Cr of 1.12).   Medications:  Infusions:  . heparin 1,200 Units/hr (09/19/15 1408)    Assessment: 76 yof presented to the ED with chest pain and found to be in afib. She is on chronic apixaban for afib with last dose yesterday. H/H is slightly low and platelets are WNL. To transition to IV heparin. Baseline aPTT 34 and HL 1.54 so not correlating - will dose heparin drip from aPTTs for now.  aPTT is below goal at 60 sec. No bleeding noted.    Goal of Therapy:  Heparin level 0.3-0.7 units/ml aPTT 66-102 seconds Monitor platelets by anticoagulation protocol: Yes   Plan:  - Increase heparin drip to 1350 units/hr - Daily HL, aPTT and CBC - Monitor for s/sx of bleeding  [facility], [place].D., BCPS Clinical Pharmacist Pager: [phone] 09/19/2015 9:43 PM

## 2015-09-19 NOTE — Evaluation (Signed)
Physical Therapy Evaluation Patient Details Name: [name] MRN: [id number] DOB: 1940/11/23 Today's Date: 09/19/2015   History of Present Illness  Patient is a 75 y.o. female presenting with chest pain and atrial fibrillation  Clinical Impression  Pt admitted with above diagnosis. Pt currently with functional limitations due to the deficits listed below (see PT Problem List). Pt has some balance disturbance with challenges and would benefit from rollator and HHPT f/u.  Pt agrees.  Will follow acutely.   Pt will benefit from skilled PT to increase their independence and safety with mobility to allow discharge to the venue listed below.      Follow Up Recommendations Home health PT    Equipment Recommendations  Other (comment) (rollator)    Recommendations for Other Services       Precautions / Restrictions Precautions Precautions: Fall Restrictions Weight Bearing Restrictions: No      Mobility  Bed Mobility Overal bed mobility: Independent                Transfers Overall transfer level: Independent                  Ambulation/Gait Ambulation/Gait assistance: Min guard;Min assist Ambulation Distance (Feet): 300 Feet Assistive device: None;1 person hand held assist Gait Pattern/deviations: Step-through pattern;Decreased stride length;Ataxic;Staggering left;Wide base of support   Gait velocity interpretation: <1.8 ft/sec, indicative of risk for recurrent falls General Gait Details: Pt at times with unsteady gait with pt staggering left > than right.  Pt needed occasional HHA and steadying and pt states that is common.  Has not ever considered device.  Discussed use of rollator and pt is somewhat interested if it would help with her instability and endurance.    Stairs            Wheelchair Mobility    Modified Rankin (Stroke Patients Only)       Balance Overall balance assessment: Needs assistance         Standing balance support: Single  extremity supported;During functional activity Standing balance-Leahy Scale: Fair Standing balance comment: Pt can stand statically and balance well but dynamic balance is more difficult              High level balance activites: Direction changes;Turns;Sudden stops;Head turns High Level Balance Comments: pt needing min guard assist with challenges.  Able to self correct with min challenges only.  Mod challenges needed min assist.              Pertinent Vitals/Pain Pain Assessment: Faces Faces Pain Scale: Hurts little more Pain Location: back Pain Descriptors / Indicators: Other (Comment) (chronic) Pain Intervention(s): Limited activity within patient's tolerance;Monitored during session;Repositioned  VSS with O2 on RA 95% and HR 89-96 bpm    Home Living Family/patient expects to be discharged to:: Private residence Living Arrangements: Alone   Type of Home: House Home Access: Level entry     Home Layout: One level Home Equipment: None Additional Comments: Lives alone with her cats per pt.     Prior Function Level of Independence: Independent         Comments: Pt does report at times she was unsteady but no falls as of yet.      Hand Dominance        Extremity/Trunk Assessment   Upper Extremity Assessment: Defer to OT evaluation           Lower Extremity Assessment: Generalized weakness      Cervical / Trunk Assessment: Normal  Communication   Communication: No difficulties  Cognition Arousal/Alertness: Awake/alert Behavior During Therapy: WFL for tasks assessed/performed Overall Cognitive Status: Within Functional Limits for tasks assessed                      General Comments      Exercises        Assessment/Plan    PT Assessment Patient needs continued PT services  PT Diagnosis Generalized weakness   PT Problem List Decreased activity tolerance;Decreased balance;Decreased mobility;Decreased knowledge of use of  DME;Decreased safety awareness;Decreased knowledge of precautions;Pain  PT Treatment Interventions DME instruction;Gait training;Functional mobility training;Therapeutic activities;Therapeutic exercise;Balance training;Patient/family education   PT Goals (Current goals can be found in the Care Plan section) Acute Rehab PT Goals Patient Stated Goal: to go home PT Goal Formulation: With patient Time For Goal Achievement: 10/03/15 Potential to Achieve Goals: Good    Frequency Min 3X/week   Barriers to discharge Decreased caregiver support      Co-evaluation               End of Session Equipment Utilized During Treatment: Gait belt Activity Tolerance: Patient limited by fatigue Patient left: in chair;with call bell/phone within reach;with nursing/sitter in room Nurse Communication: Mobility status    Functional Assessment Tool Used: clinical judgment Functional Limitation: Mobility: Walking and moving around Mobility: Walking and Moving Around Current Status [phone]): At least 1 percent but less than 20 percent impaired, limited or restricted Mobility: Walking and Moving Around Goal Status [phone]): 0 percent impaired, limited or restricted    Time: 1543-1600 PT Time Calculation (min) (ACUTE ONLY): 17 min   Charges:   PT Evaluation $PT Eval Moderate Complexity: 1 Procedure     PT G Codes:   PT G-Codes **NOT FOR INPATIENT CLASS** Functional Assessment Tool Used: clinical judgment Functional Limitation: Mobility: Walking and moving around Mobility: Walking and Moving Around Current Status [id number] At least 1 percent but less than 20 percent impaired, limited or restricted Mobility: Walking and Moving Around Goal Status [phone]): 0 percent impaired, limited or restricted    Denice Paradise 09/19/2015, 5:16 PM [facility] Acute Rehabilitation [phone] (pager)

## 2015-09-19 NOTE — Progress Notes (Signed)
Paged MD, pt requesting her Timolol eye drops and Prednisone drops for a recent eye surgery.

## 2015-09-19 NOTE — ED Notes (Signed)
Pt brought to ED by [facility] from home for intermittent burning sensation and discomfort all over her chest, on EMS arrival pt was Afib on the monitor. Pt converted to SR on the way to ED by it self, pt only got 500 mL NS by [facility]. VS BP 134/80, HR 84 regular, R 18, SPO2 96%RA, CBG 135.

## 2015-09-19 NOTE — ED Notes (Signed)
Pt requesting to have order for her restless leg medication states she needs to have this medication ASAP, EDP to be notified.

## 2015-09-19 NOTE — Consult Note (Addendum)
History and Physical  Name: [name] is a 75 y.o. female Admit date: 09/19/2015 Referring Physician:  Dr. Christy Gentles Primary Physician:  Dr. Donnie Coffin Primary Cardiologist:  Dr. Joaquim Nam  Reason:  atrial fibrillation  ASSESSMENT: 75 year old female presenting with a funny sensation that she gets when she has atrial fibrillation that was associated w/ SOB and leg weakness. She was found to be in afib w/ HR 149 that self resolved, she is now in NSR and feels back at her baseline. Unclear cause of patients symptomatic atrial fibrillation, likely multifactorial due to non compliance w/ CPAP, volume overload, recent viral illness over the weekend, and thyroid disease.   PLAN: 1. Atrial fibrillation- NSR w/ rate contolled - admit to tele - continue home cartia XT 120mg  qd, may consider up titrating dose depending on overnight telemetry results  - holding eliquis, started on heparin drip per pharmacy - ECHO ordered in case of reduced ejection fraction. EF 65-70% 12/2014. Possibly volume overload cause afib or vice versa.   - NPO after midnight - PT consulted 2. Coronary artery disease -stent to RCA in 2009. Normal myoview in 07/2013, not having any chest pain. - trend tropoinins 3. HTN - continue home HCTZ 25mg  daily and lisinopril 40mg  daily 4. Obstructive sleep apnea - unable to tolerate CPAP, could be culprit of patients afib last night 5. GERD- reports having bad acid reflux and sleeping with head elevated due to this - continue home protonix 40mg  BID 6. HLD- LDL 155 on 07/29/2015, it seems like she was on crestor 10mg . However she brought in all of her medications with her today and it was not in her medication bag nor her med list on EPIC. She is on diltiazem which has a drug interaction w/ atorvastatin.  - resume crestor but at higher intensity dose of 20mg  qd.  - checking CMET in the morning 7.Hx of multinodular goiter s/p radioactive iodine tx in 2015 - checking TSH --  came back elevated at 4.769, will f/u with free T4 level     HPI: Ms. [name] is a 75 year old female with past medical history of CAD, HLD, OSA, HTN, atrial fibrillation and multinodular goiter s/p radioactive iodine tx in 2015 who presented to the emergency department with a funny sensation in her chest that did not resolve with aspirin 325mg  and a nitroglycerin sublingual tablet. Pt noted a "funny feeling" in her chest located over her upper sternum that radiated up her neck to bilateral jaws last night before getting into bed. She took an asa 325mg  and nitro w/o resolution of the funny sensation in her chest. She then called EMS who arrived 30 minutes later at which time patient reports improvement in chest sensation. Chest sensation is not associated with nausea, vomiting, or diaphoresis. It was associated with leg weakness and shortness of breath. Currently patient feels back to her baseline. She was noted to be in afib w/ HR of 149 on EMS arrival. She was given 568ml NS en route to the ED. CXR obtained was positive for mild pulmonary vascular congestion, BNP 46.8, troponin 0.01. She was given IV lasix 40mg  and nitro gel.   Of note, she recently recovered from a 24 hour viral illness over the weekend, stating she felt feverish w/ malaise. She reports having a new cough productive of clear to white sputum. Denies hemoptysis, sick contacts, diarrhea, anxiety, new stressors, or panic attacks.  PMH:   Past Medical History  Diagnosis  Date  . Hyperlipidemia   . Fatigue   . Anxiety   . Coronary artery disease     a. s/p MI 2009 - PCI/DES distal  RCA w/ 2.75 x 18 Xience DES;  b. 05/2010 Myoview: Apical thinning, EF 73%;  c. 06/2010 Cath: moderate nonobs dzs, EF 65%;  d.  Lexiscan Myoview (07/2013):  No scar or ischemia, EF 67%; Normal Study  . Fibromyalgia   . Essential iris atrophy     Right side  . Menopause   . Glaucoma   . Environmental allergies   . GERD (gastroesophageal reflux disease)     . Osteoarthritis   . OSA (obstructive sleep apnea)   . Obesity (BMI 30-39.9)   . Hypertension     PSH:   Past Surgical History  Procedure Laterality Date  . Cardiac catheterization  06/29/2010    Mild to moderate coronary artery irregularities.  Her  proximal left anterior descending artery is moderately narrowed.  She  also has an eccentric stenosis in the proximal LAD.  These do not appear  to obstruct flow at present, but they are fairly small vessels.  They  appeared to be between 1.5 and 2 mm in diamete  . Cardiac catheterization  08/02/2009    Patent stent  . Cardiac catheterization  05/12/2008    Stent to the distal RCA   Allergies:  Cortisone and Sulfamethoxazole-trimethoprim Prior to Admit Meds:   (Not in a hospital admission) Fam HX:    Family History  Problem Relation Age of Onset  . Heart attack Mother   . Coronary artery disease Brother     had CABG  . Coronary artery disease Brother   . Coronary artery disease Brother   . Coronary artery disease Brother   . Coronary artery disease Brother   . Heart attack Brother   . Cancer Father    Social HX:    Social History   Social History  . Marital Status: Divorced    Spouse Name: N/A  . Number of Children: N/A  . Years of Education: N/A   Occupational History  . Not on file.   Social History Main Topics  . Smoking status: Former Smoker    Types: Cigarettes    Quit date: 09/13/1960  . Smokeless tobacco: Never Used  . Alcohol Use: No  . Drug Use: No  . Sexual Activity: Not on file   Other Topics Concern  . Not on file   Social History Narrative     Review of Systems: Pertinent ROS noted in HPI. All other systems are negative.  Physical Exam: Blood pressure 134/82, pulse 83, temperature 98 F (36.7 C), temperature source Oral, resp. rate 18, height 5\' 6"  (1.676 m), weight 225 lb (102.059 kg), SpO2 97 %. Weight change:   Physical Exam  Constitutional: She appears well-developed and well-nourished. No  distress.  HENT:  Head: Normocephalic and atraumatic.  Nose: Nose normal.  Eyes: Conjunctivae and EOM are normal. Right eye exhibits no discharge. Left eye exhibits no discharge. No scleral icterus.  Cardiovascular: Normal rate, regular rhythm, normal heart sounds and intact distal pulses.  Exam reveals no gallop and no friction rub.   No murmur heard. 2+ DP pulse b/l   Pulmonary/Chest: Effort normal and breath sounds normal. No respiratory distress. She has no wheezes. She has no rales.  Abdominal: Soft. Bowel sounds are normal. She exhibits no distension and no mass. There is no tenderness. There is no rebound and no guarding.  Musculoskeletal: She exhibits no edema.  Skin: Skin is warm and dry. No rash noted. She is not diaphoretic. No erythema. No pallor.   Labs: Lab Results  Component Value Date   WBC 5.8 09/19/2015   HGB 11.2* 09/19/2015   HCT 34.2* 09/19/2015   MCV 90.5 09/19/2015   PLT 195 09/19/2015    Recent Labs Lab 09/19/15 0432  NA 139  K 3.8  CL 109  CO2 21*  BUN 23*  CREATININE 1.12*  CALCIUM 9.3  GLUCOSE 129*   No results found for: PTT Lab Results  Component Value Date   INR 1.09 12/20/2014   INR 0.94 12/09/2009   INR 0.93 08/01/2009   Lab Results  Component Value Date   CKTOTAL 60 12/10/2009   CKMB 1.1 12/10/2009   TROPONINI 0.03 12/20/2014     Lab Results  Component Value Date   CHOL 220* 07/29/2015   CHOL 232* 03/02/2014   CHOL 162 01/22/2014   Lab Results  Component Value Date   HDL 44* 07/29/2015   HDL 36.20* 03/02/2014   HDL 43.90 01/22/2014   Lab Results  Component Value Date   LDLCALC 155* 07/29/2015   LDLCALC 171* 03/02/2014   LDLCALC 98 01/22/2014   Lab Results  Component Value Date   TRIG 105 07/29/2015   TRIG 126.0 03/02/2014   TRIG 101.0 01/22/2014   Lab Results  Component Value Date   CHOLHDL 5.0 07/29/2015   CHOLHDL 6 03/02/2014   CHOLHDL 4 01/22/2014   Lab Results  Component Value Date   LDLDIRECT 144.0  11/21/2010      Radiology:  Dg Chest 2 View  09/19/2015  CLINICAL DATA:  Atrial fibrillation tonight with chest burning and discomfort. EXAM: CHEST  2 VIEW COMPARISON:  12/19/2014 FINDINGS: Shallow inspiration with atelectasis in the lung bases. Cardiac enlargement with mild pulmonary vascular congestion. Developing interstitial changes in the lungs likely representing edema. No blunting of costophrenic angles. No pneumothorax. Calcification of the aorta. IMPRESSION: Cardiac enlargement with mild pulmonary vascular congestion and developing interstitial edema. Electronically Signed   By: Lucienne Capers M.D.   On: 09/19/2015 05:02    EKG: 09/19/15: NSR, VR 86; new T wave flattening in aVL and V1 otherwise no significant changes compared to EKG on 09/05/2014. Marland Kitchen ECG during atrial fibrillation, obtained by EMS, revealed anterolateral ST segment depression consistent with ischemia versus rate related.    Julious Oka 09/19/2015 9:04 AM  The patient has been seen in conjunction with Julious Oka, M.D. All aspects of care have been considered and discussed. The patient has been personally interviewed, examined, and all clinical data has been reviewed.   The patient is admitted with a relatively brief episode of atrial fibrillation. Likely etiology is underlying tendency to develop atrial fibrillation, perhaps aggravated by absence of therapy for sleep apnea. Rule out overactive thyroid, worsening LV function, or other acute illness that could've precipitated the event. Given the appearance of the EKG on presentation, myocardial ischemia is a concern, but suspect that with complete resolution of symptoms following conversion to normal sinus rhythm that atrial fibrillation was primary and not secondary to ischemia.  Cycle cardiac markers. Repeat 2-D Doppler echocardiogram.  Further workup will depend upon the data base an observation of telemetry over several hours.

## 2015-09-19 NOTE — ED Notes (Signed)
All pt interventions performed by [employer] student nurse done under direct supervision of Carlyn Reichert RN.

## 2015-09-19 NOTE — ED Notes (Signed)
Patient transported to X-ray 

## 2015-09-20 ENCOUNTER — Observation Stay (HOSPITAL_COMMUNITY): Payer: Medicare Other

## 2015-09-20 ENCOUNTER — Encounter (HOSPITAL_COMMUNITY)
Admission: EM | Disposition: A | Discharge status: Skilled Nursing Facility | Payer: Self-pay | Source: Home / Self Care | Attending: Cardiothoracic Surgery

## 2015-09-20 DIAGNOSIS — Z7901 Long term (current) use of anticoagulants: Secondary | ICD-10-CM | POA: Diagnosis not present

## 2015-09-20 DIAGNOSIS — I4891 Unspecified atrial fibrillation: Secondary | ICD-10-CM | POA: Diagnosis not present

## 2015-09-20 DIAGNOSIS — Z888 Allergy status to other drugs, medicaments and biological substances status: Secondary | ICD-10-CM | POA: Diagnosis not present

## 2015-09-20 DIAGNOSIS — E785 Hyperlipidemia, unspecified: Secondary | ICD-10-CM | POA: Diagnosis not present

## 2015-09-20 DIAGNOSIS — J9811 Atelectasis: Secondary | ICD-10-CM | POA: Diagnosis not present

## 2015-09-20 DIAGNOSIS — E871 Hypo-osmolality and hyponatremia: Secondary | ICD-10-CM | POA: Diagnosis not present

## 2015-09-20 DIAGNOSIS — Z9119 Patient's noncompliance with other medical treatment and regimen: Secondary | ICD-10-CM | POA: Diagnosis not present

## 2015-09-20 DIAGNOSIS — M6281 Muscle weakness (generalized): Secondary | ICD-10-CM | POA: Diagnosis not present

## 2015-09-20 DIAGNOSIS — J90 Pleural effusion, not elsewhere classified: Secondary | ICD-10-CM | POA: Diagnosis not present

## 2015-09-20 DIAGNOSIS — R7989 Other specified abnormal findings of blood chemistry: Secondary | ICD-10-CM

## 2015-09-20 DIAGNOSIS — M797 Fibromyalgia: Secondary | ICD-10-CM | POA: Diagnosis not present

## 2015-09-20 DIAGNOSIS — Z951 Presence of aortocoronary bypass graft: Secondary | ICD-10-CM | POA: Diagnosis not present

## 2015-09-20 DIAGNOSIS — Z87891 Personal history of nicotine dependence: Secondary | ICD-10-CM | POA: Diagnosis not present

## 2015-09-20 DIAGNOSIS — K219 Gastro-esophageal reflux disease without esophagitis: Secondary | ICD-10-CM | POA: Diagnosis present

## 2015-09-20 DIAGNOSIS — H409 Unspecified glaucoma: Secondary | ICD-10-CM | POA: Diagnosis present

## 2015-09-20 DIAGNOSIS — R918 Other nonspecific abnormal finding of lung field: Secondary | ICD-10-CM | POA: Diagnosis not present

## 2015-09-20 DIAGNOSIS — E669 Obesity, unspecified: Secondary | ICD-10-CM | POA: Diagnosis present

## 2015-09-20 DIAGNOSIS — R2689 Other abnormalities of gait and mobility: Secondary | ICD-10-CM | POA: Diagnosis not present

## 2015-09-20 DIAGNOSIS — K5900 Constipation, unspecified: Secondary | ICD-10-CM | POA: Diagnosis not present

## 2015-09-20 DIAGNOSIS — I080 Rheumatic disorders of both mitral and aortic valves: Secondary | ICD-10-CM | POA: Diagnosis not present

## 2015-09-20 DIAGNOSIS — I25110 Atherosclerotic heart disease of native coronary artery with unstable angina pectoris: Secondary | ICD-10-CM | POA: Diagnosis not present

## 2015-09-20 DIAGNOSIS — Z955 Presence of coronary angioplasty implant and graft: Secondary | ICD-10-CM | POA: Diagnosis not present

## 2015-09-20 DIAGNOSIS — E8770 Fluid overload, unspecified: Secondary | ICD-10-CM | POA: Diagnosis not present

## 2015-09-20 DIAGNOSIS — R0602 Shortness of breath: Secondary | ICD-10-CM | POA: Diagnosis not present

## 2015-09-20 DIAGNOSIS — I10 Essential (primary) hypertension: Secondary | ICD-10-CM | POA: Diagnosis not present

## 2015-09-20 DIAGNOSIS — G4733 Obstructive sleep apnea (adult) (pediatric): Secondary | ICD-10-CM | POA: Diagnosis present

## 2015-09-20 DIAGNOSIS — Z6838 Body mass index (BMI) 38.0-38.9, adult: Secondary | ICD-10-CM | POA: Diagnosis not present

## 2015-09-20 DIAGNOSIS — I481 Persistent atrial fibrillation: Secondary | ICD-10-CM | POA: Diagnosis not present

## 2015-09-20 DIAGNOSIS — Z8249 Family history of ischemic heart disease and other diseases of the circulatory system: Secondary | ICD-10-CM | POA: Diagnosis not present

## 2015-09-20 DIAGNOSIS — Z48812 Encounter for surgical aftercare following surgery on the circulatory system: Secondary | ICD-10-CM | POA: Diagnosis not present

## 2015-09-20 DIAGNOSIS — F419 Anxiety disorder, unspecified: Secondary | ICD-10-CM | POA: Diagnosis present

## 2015-09-20 DIAGNOSIS — D62 Acute posthemorrhagic anemia: Secondary | ICD-10-CM | POA: Diagnosis not present

## 2015-09-20 DIAGNOSIS — Z79899 Other long term (current) drug therapy: Secondary | ICD-10-CM | POA: Diagnosis not present

## 2015-09-20 DIAGNOSIS — H2129 Other iris atrophy: Secondary | ICD-10-CM | POA: Diagnosis present

## 2015-09-20 DIAGNOSIS — R079 Chest pain, unspecified: Secondary | ICD-10-CM | POA: Diagnosis not present

## 2015-09-20 DIAGNOSIS — I2510 Atherosclerotic heart disease of native coronary artery without angina pectoris: Secondary | ICD-10-CM | POA: Diagnosis not present

## 2015-09-20 DIAGNOSIS — I480 Paroxysmal atrial fibrillation: Secondary | ICD-10-CM | POA: Diagnosis not present

## 2015-09-20 HISTORY — PX: CARDIAC CATHETERIZATION: SHX172

## 2015-09-20 LAB — COMPREHENSIVE METABOLIC PANEL
ALK PHOS: 74 U/L (ref 38–126)
ALT: 66 U/L — AB (ref 14–54)
AST: 65 U/L — ABNORMAL HIGH (ref 15–41)
Albumin: 3 g/dL — ABNORMAL LOW (ref 3.5–5.0)
Anion gap: 11 (ref 5–15)
BILIRUBIN TOTAL: 0.5 mg/dL (ref 0.3–1.2)
BUN: 23 mg/dL — ABNORMAL HIGH (ref 6–20)
CALCIUM: 9.5 mg/dL (ref 8.9–10.3)
CO2: 22 mmol/L (ref 22–32)
CREATININE: 1.11 mg/dL — AB (ref 0.44–1.00)
Chloride: 105 mmol/L (ref 101–111)
GFR calc non Af Amer: 48 mL/min — ABNORMAL LOW (ref >60)
GFR, EST AFRICAN AMERICAN: 56 mL/min — AB (ref >60)
Glucose, Bld: 94 mg/dL (ref 65–99)
Potassium: 3.9 mmol/L (ref 3.5–5.1)
SODIUM: 138 mmol/L (ref 135–145)
TOTAL PROTEIN: 7.9 g/dL (ref 6.5–8.1)

## 2015-09-20 LAB — POCT ACTIVATED CLOTTING TIME
ACTIVATED CLOTTING TIME: 240 s
ACTIVATED CLOTTING TIME: 250 s

## 2015-09-20 LAB — TROPONIN I: Troponin I: 0.06 ng/mL — ABNORMAL HIGH (ref <0.031)

## 2015-09-20 LAB — CBC
HEMATOCRIT: 33.3 % — AB (ref 36.0–46.0)
HEMOGLOBIN: 10.5 g/dL — AB (ref 12.0–15.0)
MCH: 28.1 pg (ref 26.0–34.0)
MCHC: 31.5 g/dL (ref 30.0–36.0)
MCV: 89 fL (ref 78.0–100.0)
Platelets: 214 10*3/uL (ref 150–400)
RBC: 3.74 MIL/uL — AB (ref 3.87–5.11)
RDW: 15 % (ref 11.5–15.5)
WBC: 8 10*3/uL (ref 4.0–10.5)

## 2015-09-20 LAB — HEPARIN LEVEL (UNFRACTIONATED): Heparin Unfractionated: 0.76 IU/mL — ABNORMAL HIGH (ref 0.30–0.70)

## 2015-09-20 LAB — PROTIME-INR
INR: 1.11 (ref 0.00–1.49)
Prothrombin Time: 14.5 seconds (ref 11.6–15.2)

## 2015-09-20 LAB — APTT: aPTT: 74 seconds — ABNORMAL HIGH (ref 24–37)

## 2015-09-20 SURGERY — LEFT HEART CATH AND CORONARY ANGIOGRAPHY

## 2015-09-20 MED ORDER — NITROGLYCERIN 1 MG/10 ML FOR IR/CATH LAB
INTRA_ARTERIAL | Status: AC
  Filled 2015-09-20: qty 10

## 2015-09-20 MED ORDER — LIDOCAINE HCL (PF) 1 % IJ SOLN
INTRAMUSCULAR | Status: AC
  Filled 2015-09-20: qty 30

## 2015-09-20 MED ORDER — IOPAMIDOL (ISOVUE-370) INJECTION 76%
INTRAVENOUS | Status: AC
  Filled 2015-09-20: qty 50

## 2015-09-20 MED ORDER — MIDAZOLAM HCL 2 MG/2ML IJ SOLN
INTRAMUSCULAR | Status: DC | PRN
  Administered 2015-09-20: 1 mg via INTRAVENOUS

## 2015-09-20 MED ORDER — ADENOSINE (DIAGNOSTIC) 140MCG/KG/MIN
INTRAVENOUS | Status: DC | PRN
  Administered 2015-09-20: 140 ug/kg/min via INTRAVENOUS

## 2015-09-20 MED ORDER — ONDANSETRON HCL 4 MG/2ML IJ SOLN: 4.0000 mg | Freq: Four times a day (QID) | INTRAMUSCULAR | Status: DC | PRN

## 2015-09-20 MED ORDER — HEPARIN (PORCINE) IN NACL 2-0.9 UNIT/ML-% IJ SOLN
INTRAMUSCULAR | Status: DC | PRN
  Administered 2015-09-20: 1500 mL

## 2015-09-20 MED ORDER — FENTANYL CITRATE (PF) 100 MCG/2ML IJ SOLN
INTRAMUSCULAR | Status: DC | PRN
  Administered 2015-09-20: 25 ug via INTRAVENOUS

## 2015-09-20 MED ORDER — SODIUM CHLORIDE 0.9% FLUSH: 3.0000 mL | INTRAVENOUS | Status: DC | PRN

## 2015-09-20 MED ORDER — SODIUM CHLORIDE 0.9 % IV SOLN: 250.0000 mL | INTRAVENOUS | Status: DC | PRN

## 2015-09-20 MED ORDER — SODIUM CHLORIDE 0.9 % WEIGHT BASED INFUSION
3.0000 mL/kg/h | INTRAVENOUS | Status: DC
  Administered 2015-09-20: 3 mL/kg/h via INTRAVENOUS

## 2015-09-20 MED ORDER — SODIUM CHLORIDE 0.9% FLUSH
3.0000 mL | Freq: Two times a day (BID) | INTRAVENOUS | Status: DC
  Administered 2015-09-22: 3 mL via INTRAVENOUS

## 2015-09-20 MED ORDER — MIDAZOLAM HCL 2 MG/2ML IJ SOLN
INTRAMUSCULAR | Status: AC
  Filled 2015-09-20: qty 2

## 2015-09-20 MED ORDER — HEPARIN (PORCINE) IN NACL 2-0.9 UNIT/ML-% IJ SOLN
INTRAMUSCULAR | Status: AC
  Filled 2015-09-20: qty 1000

## 2015-09-20 MED ORDER — HEPARIN (PORCINE) IN NACL 100-0.45 UNIT/ML-% IJ SOLN
1400.0000 [IU]/h | INTRAMUSCULAR | Status: DC
  Administered 2015-09-20: 1400 [IU]/h via INTRAVENOUS
  Filled 2015-09-20: qty 250

## 2015-09-20 MED ORDER — SODIUM CHLORIDE 0.9 % WEIGHT BASED INFUSION
1.0000 mL/kg/h | INTRAVENOUS | Status: AC
  Administered 2015-09-20: 1 mL/kg/h via INTRAVENOUS

## 2015-09-20 MED ORDER — ACETAMINOPHEN 325 MG PO TABS
650.0000 mg | ORAL_TABLET | ORAL | Status: DC | PRN
  Administered 2015-09-20 – 2015-09-21 (×2): 650 mg via ORAL
  Filled 2015-09-20 (×2): qty 2

## 2015-09-20 MED ORDER — IOPAMIDOL (ISOVUE-370) INJECTION 76%
INTRAVENOUS | Status: AC
  Filled 2015-09-20: qty 100

## 2015-09-20 MED ORDER — ADENOSINE 12 MG/4ML IV SOLN
16.0000 mL | INTRAVENOUS | Status: DC
  Filled 2015-09-20: qty 16

## 2015-09-20 MED ORDER — IOPAMIDOL (ISOVUE-370) INJECTION 76%
INTRAVENOUS | Status: AC
Start: 2015-09-20 — End: 2015-09-20
  Filled 2015-09-20: qty 50

## 2015-09-20 MED ORDER — HEPARIN SODIUM (PORCINE) 1000 UNIT/ML IJ SOLN
INTRAMUSCULAR | Status: DC | PRN
  Administered 2015-09-20: 3000 [IU] via INTRAVENOUS
  Administered 2015-09-20: 5000 [IU] via INTRAVENOUS
  Administered 2015-09-20: 3000 [IU] via INTRAVENOUS
  Administered 2015-09-20: 5000 [IU] via INTRAVENOUS

## 2015-09-20 MED ORDER — ASPIRIN 81 MG PO CHEW
81.0000 mg | CHEWABLE_TABLET | ORAL | Status: AC
  Administered 2015-09-20: 81 mg via ORAL
  Filled 2015-09-20: qty 1

## 2015-09-20 MED ORDER — LIDOCAINE HCL (PF) 1 % IJ SOLN
INTRAMUSCULAR | Status: DC | PRN
  Administered 2015-09-20: 2 mL via INTRADERMAL

## 2015-09-20 MED ORDER — SODIUM CHLORIDE 0.9 % WEIGHT BASED INFUSION
1.0000 mL/kg/h | INTRAVENOUS | Status: DC
Start: 2015-09-20 — End: 2015-09-20

## 2015-09-20 MED ORDER — SODIUM CHLORIDE 0.9% FLUSH: 3.0000 mL | Freq: Two times a day (BID) | INTRAVENOUS | Status: DC

## 2015-09-20 MED ORDER — VERAPAMIL HCL 2.5 MG/ML IV SOLN
INTRAVENOUS | Status: AC
  Filled 2015-09-20: qty 2

## 2015-09-20 MED ORDER — HEPARIN SODIUM (PORCINE) 1000 UNIT/ML IJ SOLN
INTRAMUSCULAR | Status: AC
  Filled 2015-09-20: qty 1

## 2015-09-20 MED ORDER — HEPARIN (PORCINE) IN NACL 100-0.45 UNIT/ML-% IJ SOLN
1400.0000 [IU]/h | INTRAMUSCULAR | Status: DC
  Administered 2015-09-21: 1400 [IU]/h via INTRAVENOUS
  Filled 2015-09-20 (×3): qty 250

## 2015-09-20 MED ORDER — HEPARIN (PORCINE) IN NACL 2-0.9 UNIT/ML-% IJ SOLN
INTRAMUSCULAR | Status: DC | PRN
  Administered 2015-09-20: 10 mL via INTRA_ARTERIAL

## 2015-09-20 MED ORDER — FENTANYL CITRATE (PF) 100 MCG/2ML IJ SOLN
INTRAMUSCULAR | Status: AC
  Filled 2015-09-20: qty 2

## 2015-09-20 MED ORDER — IOPAMIDOL (ISOVUE-370) INJECTION 76%
INTRAVENOUS | Status: DC | PRN
  Administered 2015-09-20: 170 mL via INTRA_ARTERIAL

## 2015-09-20 SURGICAL SUPPLY — 20 items
CATH INFINITI 5 FR JL3.5 (CATHETERS) ×1 IMPLANT
CATH INFINITI JR4 5F (CATHETERS) ×1 IMPLANT
CATH LAUNCHER 5F EBU3.0 (CATHETERS) IMPLANT
CATH MICROCATH NAVVUS (MICROCATHETER) IMPLANT
CATH OPTICROSS 40MHZ (CATHETERS) ×1 IMPLANT
CATHETER LAUNCHER 5F EBU3.0 (CATHETERS) ×3
DEVICE RAD COMP TR BAND LRG (VASCULAR PRODUCTS) ×1 IMPLANT
GLIDESHEATH SLEND SS 6F .021 (SHEATH) ×1 IMPLANT
GUIDE CATH RUNWAY 6FR FR4 (CATHETERS) ×1 IMPLANT
KIT HEART LEFT (KITS) ×3 IMPLANT
MICROCATHETER NAVVUS (MICROCATHETER) ×3
PACK CARDIAC CATHETERIZATION (CUSTOM PROCEDURE TRAY) ×3 IMPLANT
SLED PULL BACK IVUS (MISCELLANEOUS) ×1 IMPLANT
TRANSDUCER W/STOPCOCK (MISCELLANEOUS) ×3 IMPLANT
TUBING CIL FLEX 10 FLL-RA (TUBING) ×3 IMPLANT
VALVE GUARDIAN II ~~LOC~~ HEMO (MISCELLANEOUS) ×1 IMPLANT
WIRE ASAHI PROWATER 180CM (WIRE) ×1 IMPLANT
WIRE HI TORQ BMW 190CM (WIRE) ×1 IMPLANT
WIRE HI TORQ VERSACORE-J 145CM (WIRE) ×1 IMPLANT
WIRE SAFE-T 1.5MM-J .035X260CM (WIRE) ×1 IMPLANT

## 2015-09-20 NOTE — Plan of Care (Signed)
Problem: Pain Managment: Goal: General experience of comfort will improve Outcome: Completed/Met Date Met:  09/20/15 Pt remains comfortable   Problem: Physical Regulation: Goal: Ability to maintain clinical measurements within normal limits will improve Outcome: Completed/Met Date Met:  09/20/15 Pt able to maintain activity within normal limits  Goal: Will remain free from infection Outcome: Completed/Met Date Met:  09/20/15 Pt remains free from infection   Problem: Phase I Progression Outcomes Goal: Distal pulses equal to baseline Outcome: Completed/Met Date Met:  09/20/15 Pulses equal to baseline Goal: Vascular site scale level 0 - I Vascular Site Scale Level 0: No bruising/bleeding/hematoma Level I (Mild): Bruising/Ecchymosis, minimal bleeding/ooozing, palpable hematoma < 3 cm Level II (Moderate): Bleeding not affecting hemodynamic parameters, pseudoaneurysm, palpable hematoma > 3 cm Level III (Severe) Bleeding which affects hemodynamic parameters or retroperitoneal hemorrhage  Outcome: Completed/Met Date Met:  09/20/15 Right radial level 0

## 2015-09-20 NOTE — Interval H&P Note (Signed)
Cath Lab Visit (complete for each Cath Lab visit)  Clinical Evaluation Leading to the Procedure:   ACS: Yes.    Non-ACS:    Anginal Classification: CCS IV  Anti-ischemic medical therapy: Minimal Therapy (1 class of medications)  Non-Invasive Test Results: No non-invasive testing performed  Prior CABG: No previous CABG      History and Physical Interval Note:  09/20/2015 1:43 PM  [name]  has presented today for surgery, with the diagnosis of non stemi  The various methods of treatment have been discussed with the patient and family. After consideration of risks, benefits and other options for treatment, the patient has consented to  Procedure(s): Left Heart Cath and Coronary Angiography (N/A) as a surgical intervention .  The patient's history has been reviewed, patient examined, no change in status, stable for surgery.  I have reviewed the patient's chart and labs.  Questions were answered to the patient's satisfaction.     VARANASI,JAYADEEP S.

## 2015-09-20 NOTE — Progress Notes (Signed)
ANTICOAGULATION CONSULT NOTE - Follow Up Consult  Pharmacy Consult:  Heparin Indication: atrial fibrillation  Allergies  Allergen Reactions  . Cortisone Nausea Only and Other (See Comments)    Pt gets very hot and flushed  . Sulfamethoxazole-Trimethoprim Other (See Comments)    Reaction: unknown    Patient Measurements: Height: 5\' 6"  (167.6 cm) Weight: 227 lb 12.8 oz (103.329 kg) IBW/kg (Calculated) : 59.3 Heparin Dosing Weight: 83 kg  Vital Signs:    Labs:  Recent Labs  09/19/15 0432 09/19/15 1155 09/19/15 1235 09/19/15 1236 09/19/15 1804 09/19/15 2102 09/20/15 0005  HGB 11.2*  --   --   --   --   --  10.5*  HCT 34.2*  --   --   --   --   --  33.3*  PLT 195  --   --   --   --   --  214  APTT  --   --  34  --   --  60* 74*  HEPARINUNFRC  --   --   --  1.54*  --   --  0.76*  CREATININE 1.12*  --   --   --   --   --  1.11*  TROPONINI  --  0.06*  --   --  0.06*  --  0.06*    Estimated Creatinine Clearance: 54.8 mL/min (by C-G formula based on Cr of 1.11).     Assessment: [name] with history of Afib on Eliquis PTA.  Patient presented to the ED with chest pain and anticoagulation was switched to IV heparin.  Currently using aPTT to guide heparin dosing as Eliquis has falsely elevated heparin levels.  APTT therapeutic; no bleeding reported.   Goal of Therapy:  Heparin level 0.3-0.7 units/ml  APTT 66 - 102 sec Monitor platelets by anticoagulation protocol: Yes    Plan:  - Increase heparin gtt slightly to 1400 units/hr - Daily HL / CBC / aPTT - F/U nitrofurantoin LOT   Arnisha Laffoon D. Mina Marble, PharmD, BCPS Pager:  [phone] 09/20/2015, 7:39 AM

## 2015-09-20 NOTE — Progress Notes (Signed)
   After reviewing all data including ST segment depression during atrial fibrillation, prior history of circumflex stenting 2009, strong family history of CAD, and trace positive troponin, we have decided that coronary angiography would be helpful to exclude the possibility of an ischemic origin for atrial fibrillation. The patient was counseled to undergo left heart catheterization, coronary angiography, and possible percutaneous coronary intervention with stent implantation. The procedural risks and benefits were discussed in detail. The risks discussed included death, stroke, myocardial infarction, life-threatening bleeding, limb ischemia, kidney injury, allergy, and possible emergency cardiac surgery. The risk of these significant complications were estimated to occur less than 1% of the time. After discussion, the patient has agreed to proceed.

## 2015-09-20 NOTE — H&P (View-Only) (Signed)
   After reviewing all data including ST segment depression during atrial fibrillation, prior history of circumflex stenting 2009, strong family history of CAD, and trace positive troponin, we have decided that coronary angiography would be helpful to exclude the possibility of an ischemic origin for atrial fibrillation. The patient was counseled to undergo left heart catheterization, coronary angiography, and possible percutaneous coronary intervention with stent implantation. The procedural risks and benefits were discussed in detail. The risks discussed included death, stroke, myocardial infarction, life-threatening bleeding, limb ischemia, kidney injury, allergy, and possible emergency cardiac surgery. The risk of these significant complications were estimated to occur less than 1% of the time. After discussion, the patient has agreed to proceed.

## 2015-09-20 NOTE — Research (Signed)
[facility]ERS [facility] II Informed Consent   Subject Name: [name]  Subject met inclusion and exclusion criteria.  The informed consent form, study requirements and expectations were reviewed with the subject and questions and concerns were addressed prior to the signing of the consent form.  The subject verbalized understanding of the trail requirements.  The subject agreed to participate in the [place] II trial and signed the informed consent.  The informed consent was obtained prior to performance of any protocol-specific procedures for the subject.  A copy of the signed informed consent was given to the subject and a copy was placed in the subject's medical record. If subject meets angiographic criteria she will be enrolled in the [facility] trial at the doctors discretion.   Berneda Rose 09/20/2015, 10:34 AM

## 2015-09-20 NOTE — Progress Notes (Signed)
[place] for heparin Indication: atrial fibrillation  Allergies  Allergen Reactions  . Cortisone Nausea Only and Other (See Comments)    Pt gets very hot and flushed  . Sulfamethoxazole-Trimethoprim Other (See Comments)    Reaction: unknown    Patient Measurements: Height: 5\' 6"  (167.6 cm) Weight: 227 lb 12.8 oz (103.329 kg) IBW/kg (Calculated) : 59.3 Heparin Dosing Weight: 82.9 kg  Vital Signs: Temp: 97.6 F (36.4 C) (05/01 1929) Temp Source: Oral (05/01 1929) BP: 145/51 mmHg (05/01 1929) Pulse Rate: 65 (05/01 1929)  Labs:  Recent Labs  09/19/15 0432 09/19/15 1155 09/19/15 1235 09/19/15 1236 09/19/15 1804 09/19/15 2102 09/20/15 0005  HGB 11.2*  --   --   --   --   --  10.5*  HCT 34.2*  --   --   --   --   --  33.3*  PLT 195  --   --   --   --   --  214  APTT  --   --  34  --   --  60* 74*  HEPARINUNFRC  --   --   --  1.54*  --   --  0.76*  CREATININE 1.12*  --   --   --   --   --   --   TROPONINI  --  0.06*  --   --  0.06*  --   --     Estimated Creatinine Clearance: 54.3 mL/min (by C-G formula based on Cr of 1.12).  Assessment: 75 yo female with Afib, Eliquis on hold, for heparin  Goal of Therapy:  Heparin level 0.3-0.7 units/ml aPTT 66-102 seconds Monitor platelets by anticoagulation protocol: Yes   Plan:  Continue Heparin at current rate   Phillis Knack, PharmD, BCPS  09/20/2015 12:59 AM

## 2015-09-20 NOTE — Care Management Obs Status (Signed)
[facility] NOTIFICATION   Patient Details  Name: [name] MRN: [id number] Date of Birth: 05-13-1941   Medicare Observation Status Notification Given:  Yes    Bethena Roys, RN 09/20/2015, 12:28 PM

## 2015-09-20 NOTE — Progress Notes (Addendum)
Patient Name: [name] Date of Encounter: 09/20/2015    SUBJECTIVE: Pt has no complaints, no overnight events.   TELEMETRY:  NSR Filed Vitals:   09/19/15 1347 09/19/15 1507 09/19/15 1929 09/20/15 0756  BP: 161/65 142/58 145/51 135/53  Pulse:  65 65   Temp:  98.1 F (36.7 C) 97.6 F (36.4 C)   TempSrc:  Oral Oral   Resp:   18   Height:  5\' 6"  (1.676 m)    Weight:  227 lb 12.8 oz (103.329 kg)    SpO2:  98% 97%     Intake/Output Summary (Last 24 hours) at 09/20/15 0821 Last data filed at 09/19/15 1835  Gross per 24 hour  Intake  286.4 ml  Output      0 ml  Net  286.4 ml   LABS: Basic Metabolic Panel:  Recent Labs  09/19/15 0432 09/20/15 0005  NA 139 138  K 3.8 3.9  CL 109 105  CO2 21* 22  GLUCOSE 129* 94  BUN 23* 23*  CREATININE 1.12* 1.11*  CALCIUM 9.3 9.5   CBC:  Recent Labs  09/19/15 0432 09/20/15 0005  WBC 5.8 8.0  NEUTROABS 3.3  --   HGB 11.2* 10.5*  HCT 34.2* 33.3*  MCV 90.5 89.0  PLT 195 214   Cardiac Enzymes:  Recent Labs  09/19/15 1155 09/19/15 1804 09/20/15 0005  TROPONINI 0.06* 0.06* 0.06*    Physical Exam: Blood pressure 135/53, pulse 65, temperature 97.6 F (36.4 C), temperature source Oral, resp. rate 18, height 5\' 6"  (1.676 m), weight 227 lb 12.8 oz (103.329 kg), SpO2 97 %. Weight change: 2 lb 12.8 oz (1.27 kg)  Wt Readings from Last 3 Encounters:  09/19/15 227 lb 12.8 oz (103.329 kg)  09/11/15 225 lb (102.059 kg)  07/29/15 230 lb 3.2 oz (104.418 kg)    Physical Exam  Constitutional: She appears well-developed and well-nourished. No distress.  HENT:  Head: Normocephalic and atraumatic.  Nose: Nose normal.  Eyes: EOM are normal. No scleral icterus.  Cardiovascular: Normal rate, regular rhythm and normal heart sounds.  Exam reveals no gallop and no friction rub.   No murmur heard. Pulmonary/Chest: Effort normal and breath sounds normal. No respiratory distress. She has no wheezes. She has no rales. She  exhibits no tenderness.  Abdominal: Soft. Bowel sounds are normal. She exhibits no distension and no mass. There is no tenderness. There is no rebound and no guarding.  Skin: Skin is warm and dry. No rash noted. She is not diaphoretic. No erythema. No pallor.    ASSESSMENT: 75 year old female presenting with a funny sensation that she gets when she has atrial fibrillation that was associated w/ SOB and leg weakness. Unclear cause of patients symptomatic atrial fibrillation, likely multifactorial due to non compliance w/ CPAP, volume overload, recent viral illness over the weekend, and thyroid disease.   PLAN: 1. Atrial fibrillation- NSR w/ rate contolled. Pt to undergo left heart cath today.  2. Coronary artery disease -stent to RCA in 2009. Normal myoview in 07/2013, not having any chest pain. - troponins elevated at 0.06 x 3  3. HTN - continue home HCTZ 25mg  daily and lisinopril 40mg  daily 4. Obstructive sleep apnea - unable to tolerate CPAP, could be culprit of patients afib  5. GERD- reports having bad acid reflux and sleeping with head elevated due to this - continue home protonix 40mg  BID 6. HLD- LDL 155 on 07/29/2015, it seems like  she was on crestor 10mg . However she brought in all of her medications with her today and it was not in her medication bag nor her med list on EPIC. She is on diltiazem which has a drug interaction w/ atorvastatin.  - resume crestor but at higher intensity dose of 20mg  qd.  - checking CMET in the morning 7.Hx of multinodular goiter s/p radioactive iodine tx in 2015 - checking TSH -- came back elevated at 4.769 w/ nl FT4 consistent w/ subclinical hypothyroidism. F/u with Dr. Buddy Duty as outpatient    Signed, Julious Oka 09/20/2015, 8:21 AM  The patient has been seen in conjunction with D. Hulen Luster, MD. All aspects of care have been considered and discussed. The patient has been personally interviewed, examined, and all clinical data has been  reviewed.   Suspect suspect significant underlying CAD given ischemia noted on ECG during tachycardia from atrial fib.  Plan cath today. Discussed with patient including risks.  CHADS Vasc score is 3.

## 2015-09-20 NOTE — Plan of Care (Signed)
Problem: Consults Goal: Cardiac Cath Patient Education (See Patient Education module for education specifics.) Outcome: Completed/Met Date Met:  09/20/15 Pt received education regarding cardiac cath, all questions answered   Problem: Phase I Progression Outcomes Goal: Pain controlled with appropriate interventions Outcome: Completed/Met Date Met:  09/20/15 Pt remains free from chest pain

## 2015-09-20 NOTE — Progress Notes (Signed)
Physical Therapy Treatment Patient Details Name: [name] MRN: [id number] DOB: March 23, 1941 Today's Date: 09/20/2015    History of Present Illness Patient is a 75 y.o. female presenting with chest pain and atrial fibrillation    PT Comments    Patient making progress towards PT goals. Mobilizing well but still with some instability at times. Educated patient on techniques to assist with stability during ambulation.  Also educated patient on techniques for energy conservation and pain management for low back pain.   Follow Up Recommendations  Home health PT     Equipment Recommendations  Other (comment) (rollator)    Recommendations for Other Services       Precautions / Restrictions Precautions Precautions: Fall Restrictions Weight Bearing Restrictions: No    Mobility  Bed Mobility Overal bed mobility: Independent                Transfers Overall transfer level: Independent                  Ambulation/Gait Ambulation/Gait assistance: Supervision Ambulation Distance (Feet): 210 Feet Assistive device: None;1 person hand held assist Gait Pattern/deviations: Step-through pattern;Decreased stride length;Ataxic;Staggering left;Wide base of support Gait velocity: decreased Gait velocity interpretation: Below normal speed for age/gender General Gait Details: Patient still with staggering gait and multiple balance checks at times, no physical assist required, patient able to self correct. Some DOE noted    Financial trader Rankin (Stroke Patients Only)       Balance           Standing balance support: Single extremity supported Standing balance-Leahy Scale: Fair               High level balance activites: Direction changes;Turns;Sudden stops;Head turns High Level Balance Comments: some difficulty with higher level balance tasks    Cognition Arousal/Alertness: Awake/alert Behavior During Therapy:  WFL for tasks assessed/performed Overall Cognitive Status: Within Functional Limits for tasks assessed                      Exercises      General Comments        Pertinent Vitals/Pain Pain Assessment: Faces Faces Pain Scale: Hurts a little bit Pain Location: back Pain Descriptors / Indicators:  (chronic back pain) Pain Intervention(s): Monitored during session    Home Living                      Prior Function            PT Goals (current goals can now be found in the care plan section) Acute Rehab PT Goals Patient Stated Goal: to go home PT Goal Formulation: With patient Time For Goal Achievement: 10/03/15 Potential to Achieve Goals: Good Progress towards PT goals: Progressing toward goals    Frequency  Min 3X/week    PT Plan Current plan remains appropriate    Co-evaluation             End of Session Equipment Utilized During Treatment: Gait belt Activity Tolerance: Patient limited by fatigue Patient left: in chair;with call bell/phone within reach;with nursing/sitter in room     Time: 1230-1249 PT Time Calculation (min) (ACUTE ONLY): 19 min  Charges:  $Gait Training: 8-22 mins                    G Codes:  Duncan Dull 09/20/2015, 1:17 PM Alben Deeds, [place] DPT  [phone]

## 2015-09-20 NOTE — Care Management Note (Signed)
Case Management Note  Patient Details  Name: [name] MRN: [id number] Date of Birth: 1941/04/04  Subjective/Objective: Pt admitted for A Fib and Chest Pain. Plan is for cardiac cath 09-20-15. Pt is from home and plans to return home.         Action/Plan: CM did provide pt with an agency list for [facility]. CM will check back with pt in regards to agency choice. CM will continue to monitor.   Expected Discharge Date:                  Expected Discharge Plan:  [name]  In-House Referral:  NA  Discharge planning Services  CM Consult  Post Acute Care Choice:  Home Health Choice offered to:  Patient  DME Arranged:  N/A DME Agency:  NA  HH Arranged:  PT HH Agency:     Status of Service:  In process, will continue to follow  Medicare Important Message Given:    Date Medicare IM Given:    Medicare IM give by:    Date Additional Medicare IM Given:    Additional Medicare Important Message give by:     If discussed at [place] of Stay Meetings, dates discussed:    Additional Comments:  Bethena Roys, RN 09/20/2015, 12:29 PM

## 2015-09-21 ENCOUNTER — Inpatient Hospital Stay (HOSPITAL_COMMUNITY): Payer: Medicare Other

## 2015-09-21 ENCOUNTER — Other Ambulatory Visit: Payer: Self-pay | Admitting: *Deleted

## 2015-09-21 ENCOUNTER — Encounter (HOSPITAL_COMMUNITY): Payer: Self-pay | Admitting: Interventional Cardiology

## 2015-09-21 DIAGNOSIS — E785 Hyperlipidemia, unspecified: Secondary | ICD-10-CM

## 2015-09-21 DIAGNOSIS — I4891 Unspecified atrial fibrillation: Secondary | ICD-10-CM

## 2015-09-21 DIAGNOSIS — I481 Persistent atrial fibrillation: Secondary | ICD-10-CM

## 2015-09-21 DIAGNOSIS — I2510 Atherosclerotic heart disease of native coronary artery without angina pectoris: Secondary | ICD-10-CM

## 2015-09-21 LAB — CBC
HCT: 32.6 % — ABNORMAL LOW (ref 36.0–46.0)
Hemoglobin: 10.6 g/dL — ABNORMAL LOW (ref 12.0–15.0)
MCH: 29.6 pg (ref 26.0–34.0)
MCHC: 32.5 g/dL (ref 30.0–36.0)
MCV: 91.1 fL (ref 78.0–100.0)
PLATELETS: 194 10*3/uL (ref 150–400)
RBC: 3.58 MIL/uL — ABNORMAL LOW (ref 3.87–5.11)
RDW: 14.8 % (ref 11.5–15.5)
WBC: 6.1 10*3/uL (ref 4.0–10.5)

## 2015-09-21 LAB — PULMONARY FUNCTION TEST
DL/VA % pred: 61 %
DL/VA: 3.08 ml/min/mmHg/L
DLCO cor % pred: 55 %
DLCO cor: 14.87 ml/min/mmHg
DLCO unc % pred: 49 %
DLCO unc: 13.41 ml/min/mmHg
FEF 25-75 Post: 3.5 L/sec
FEF 25-75 Pre: 2.9 L/sec
FEF2575-%Change-Post: 20 %
FEF2575-%Pred-Post: 189 %
FEF2575-%Pred-Pre: 156 %
FEV1-%Change-Post: 8 %
FEV1-%Pred-Post: 121 %
FEV1-%Pred-Pre: 112 %
FEV1-Post: 2.86 L
FEV1-Pre: 2.63 L
FEV1FVC-%Change-Post: 3 %
FEV1FVC-%Pred-Pre: 108 %
FEV6-%Change-Post: 4 %
FEV6-%Pred-Post: 114 %
FEV6-%Pred-Pre: 109 %
FEV6-Post: 3.41 L
FEV6-Pre: 3.25 L
FEV6FVC-%Pred-Post: 104 %
FEV6FVC-%Pred-Pre: 104 %
FVC-%Change-Post: 4 %
FVC-%Pred-Post: 109 %
FVC-%Pred-Pre: 104 %
FVC-Post: 3.41 L
FVC-Pre: 3.25 L
Post FEV1/FVC ratio: 84 %
Post FEV6/FVC ratio: 100 %
Pre FEV1/FVC ratio: 81 %
Pre FEV6/FVC Ratio: 100 %
RV % pred: 87 %
RV: 2.07 L
TLC % pred: 99 %
TLC: 5.34 L

## 2015-09-21 LAB — ECHOCARDIOGRAM COMPLETE
HEIGHTINCHES: 66 in
WEIGHTICAEL: 3659.2 [oz_av]

## 2015-09-21 LAB — HEPARIN LEVEL (UNFRACTIONATED): HEPARIN UNFRACTIONATED: 0.36 [IU]/mL (ref 0.30–0.70)

## 2015-09-21 LAB — APTT: APTT: 73 s — AB (ref 24–37)

## 2015-09-21 MED ORDER — OXYCODONE-ACETAMINOPHEN 5-325 MG PO TABS: 1.0000 | ORAL_TABLET | Freq: Four times a day (QID) | ORAL | Status: DC | PRN

## 2015-09-21 MED ORDER — ROSUVASTATIN CALCIUM 10 MG PO TABS
20.0000 mg | ORAL_TABLET | Freq: Every day | ORAL | Status: DC
  Administered 2015-09-21 – 2015-09-29 (×8): 20 mg via ORAL
  Filled 2015-09-21 (×3): qty 2
  Filled 2015-09-21: qty 1
  Filled 2015-09-21 (×3): qty 2
  Filled 2015-09-21: qty 1

## 2015-09-21 MED ORDER — ALBUTEROL SULFATE (2.5 MG/3ML) 0.083% IN NEBU
2.5000 mg | INHALATION_SOLUTION | Freq: Once | RESPIRATORY_TRACT | Status: AC
  Administered 2015-09-21: 2.5 mg via RESPIRATORY_TRACT

## 2015-09-21 MED ORDER — TRAMADOL HCL 50 MG PO TABS
50.0000 mg | ORAL_TABLET | Freq: Once | ORAL | Status: AC
  Administered 2015-09-21: 50 mg via ORAL
  Filled 2015-09-21: qty 1

## 2015-09-21 MED FILL — Nitroglycerin IV Soln 100 MCG/ML in D5W: INTRA_ARTERIAL | Qty: 10 | Status: AC

## 2015-09-21 NOTE — Progress Notes (Signed)
ANTICOAGULATION CONSULT NOTE - Follow Up Consult  Pharmacy Consult:  Heparin Indication: atrial fibrillation  Allergies  Allergen Reactions  . Cortisone Nausea Only and Other (See Comments)    Pt gets very hot and flushed  . Sulfamethoxazole-Trimethoprim Other (See Comments)    Reaction: unknown    Patient Measurements: Height: 5\' 6"  (167.6 cm) Weight: 227 lb 12.8 oz (103.329 kg) IBW/kg (Calculated) : 59.3 Heparin Dosing Weight: 83 kg  Vital Signs: Temp: 98.3 F (36.8 C) (05/02 1935) Temp Source: Oral (05/02 1935) BP: 126/45 mmHg (05/02 1935) Pulse Rate: 72 (05/02 1935)  Labs:  Recent Labs  09/19/15 0432 09/19/15 1155  09/19/15 1236 09/19/15 1804 09/19/15 2102 09/20/15 0005 09/20/15 1037 09/21/15 0355 09/21/15 0358  HGB 11.2*  --   --   --   --   --  10.5*  --  10.6*  --   HCT 34.2*  --   --   --   --   --  33.3*  --  32.6*  --   PLT 195  --   --   --   --   --  214  --  194  --   APTT  --   --   < >  --   --  60* 74*  --  73*  --   LABPROT  --   --   --   --   --   --   --  14.5  --   --   INR  --   --   --   --   --   --   --  1.11  --   --   HEPARINUNFRC  --   --   --  1.54*  --   --  0.76*  --   --  0.36  CREATININE 1.12*  --   --   --   --   --  1.11*  --   --   --   TROPONINI  --  0.06*  --   --  0.06*  --  0.06*  --   --   --   < > = values in this interval not displayed.  Estimated Creatinine Clearance: 54.8 mL/min (by C-G formula based on Cr of 1.11).     Assessment: 80 YOF with history of Afib on Eliquis PTA.  Patient presented to the ED with chest pain and anticoagulation was switched to IV heparin. Heparin level and aPTT now correlating so will utilize heparin level only from now on (both therapeutic). CBC stable, no bleeding noted.   Goal of Therapy:  Heparin level 0.3-0.7 units/ml  APTT 66 - 102 sec Monitor platelets by anticoagulation protocol: Yes    Plan:  - Continue heparin gtt at 1400 units/hr - D/c daily PTT, continue daily CBC  and heparin level  Sherlon Handing, PharmD, BCPS Clinical pharmacist, pager [phone] 09/21/2015, 5:56 AM

## 2015-09-21 NOTE — Plan of Care (Signed)
Problem: Phase II Progression Outcomes Goal: Vascular site scale level 0 - I Vascular Site Scale Level 0: No bruising/bleeding/hematoma Level I (Mild): Bruising/Ecchymosis, minimal bleeding/ooozing, palpable hematoma < 3 cm Level II (Moderate): Bleeding not affecting hemodynamic parameters, pseudoaneurysm, palpable hematoma > 3 cm Level III (Severe) Bleeding which affects hemodynamic parameters or retroperitoneal hemorrhage  Outcome: Completed/Met Date Met:  09/21/15 Vascular site a Level 0 & documented

## 2015-09-21 NOTE — Progress Notes (Signed)
Echocardiogram 2D Echocardiogram has been performed.  [name] 09/21/2015, 11:49 AM

## 2015-09-21 NOTE — Consult Note (Signed)
[address]       [place] [address]             [phone]        [name] [facility] Medical Record [id number] Date of Birth: 04-19-1941  Referring:Dr Linard Millers. Primary Care: Donnie Coffin, MD  Chief Complaint:    Chief Complaint  Patient presents with  . Chest Pain  . Atrial Fibrillation    History of Present Illness:     75 year old female presenting with a funny sensation that she gets when she has atrial fibrillation that was associated w/ SOB and leg weakness. She called EMS 3am Monday and was brought to hospital . She was found to be in afib w/ HR 149 that self resolved, she is now in NSR and feels back at her baseline. Unclear cause of patients symptomatic atrial fibrillation, likely multifactorial due to non compliance w/ CPAP, volume overload, recent viral illness over the weekend, and thyroid disease.  Patient has noted for months increasing dyspnea on exertion. Has been on elliquis for episodic afib    Cardiology reviewed  all data including ST segment depression during atrial fibrillation, prior history of circumflex stenting 2009, strong family history of CAD, and trace positive troponin, and decided  Decided to proceed with  coronary angiography  to exclude the possibility of an ischemic origin for atrial fibrillation.   Current Activity/ Functional Status: Patient is independent with mobility/ambulation, transfers, ADL's, IADL's.   Zubrod Score: At the time of surgery this patient's most appropriate activity status/level should be described as: []     0    Normal activity, no symptoms [x]     1    Restricted in physical strenuous activity but ambulatory, able to do out light work []     2    Ambulatory and capable of self care, unable to do work activities, up and about                 more than 50%  Of the time                            []     3    Only limited self care, in bed greater than 50% of waking hours []     4    Completely  disabled, no self care, confined to bed or chair []     5    Moribund  Past Medical History  Diagnosis Date  . Hyperlipidemia   . Fatigue   . Anxiety   . Coronary artery disease     a. s/p MI 2009 - PCI/DES distal  RCA w/ 2.75 x 18 Xience DES;  b. 05/2010 Myoview: Apical thinning, EF 73%;  c. 06/2010 Cath: moderate nonobs dzs, EF 65%;  d.  Lexiscan Myoview (07/2013):  No scar or ischemia, EF 67%; Normal Study  . Fibromyalgia   . Essential iris atrophy     Right side  . Menopause   . Glaucoma   . Environmental allergies   . GERD (gastroesophageal reflux disease)   . Osteoarthritis   . OSA (obstructive sleep apnea)   . Obesity (BMI 30-39.9)   . Hypertension     Past Surgical History  Procedure Laterality Date  . Cardiac catheterization  06/29/2010    Mild to moderate coronary artery irregularities.  Her  proximal left anterior descending artery is moderately narrowed.  She  also has an eccentric stenosis in the proximal LAD.  These do not appear  to obstruct flow at present, but they are fairly small vessels.  They  appeared to be between 1.5 and 2 mm in diamete  . Cardiac catheterization  08/02/2009    Patent stent  . Cardiac catheterization  05/12/2008    Stent to the distal RCA  . Cardiac catheterization N/A 09/20/2015    Procedure: Left Heart Cath and Coronary Angiography;  Surgeon: Jettie Booze, MD;  Location: [facility];  Service: Cardiovascular;  Laterality: N/A;  . Cardiac catheterization  09/20/2015    Procedure: Intravascular Ultrasound/IVUS;  Surgeon: Jettie Booze, MD;  Location: [facility];  Service: Cardiovascular;;  . Cardiac catheterization  09/20/2015    Procedure: Intravascular Pressure Wire/FFR Study;  Surgeon: Jettie Booze, MD;  Location: [facility];  Service: Cardiovascular;;    History  Smoking status  . Former Smoker  . Types: Cigarettes  . Quit date: 09/13/1960  Smokeless tobacco  . Never Used    History  Alcohol Use  No    Social History   Social History  . Marital Status: Divorced    Spouse Name: N/A  . Number of Children: N/A  . Years of Education: N/A   Occupational History  . Not on file.   Social History Main Topics  . Smoking status: Former Smoker    Types: Cigarettes    Quit date: 09/13/1960  . Smokeless tobacco: Never Used  . Alcohol Use: No  . Drug Use: No  . Sexual Activity: Not on file   Other Topics Concern  . Not on file   Social History Narrative    Allergies  Allergen Reactions  . Cortisone Nausea Only and Other (See Comments)    Pt gets very hot and flushed  . Sulfamethoxazole-Trimethoprim Other (See Comments)    Reaction: unknown    Current Facility-Administered Medications  Medication Dose Route Frequency Provider Last Rate Last Dose  . 0.9 %  sodium chloride infusion  250 mL Intravenous PRN Jettie Booze, MD      . acetaminophen (TYLENOL) tablet 650 mg  650 mg Oral Q4H PRN Jettie Booze, MD   650 mg at 09/21/15 0755  . diltiazem (CARDIZEM CD) 24 hr capsule 120 mg  120 mg Oral Daily Norman Herrlich, MD   120 mg at 09/21/15 1025  . heparin ADULT infusion 100 units/mL (25000 units/250 mL)  1,400 Units/hr Intravenous Continuous Jettie Booze, MD 14 mL/hr at 09/21/15 1024 1,400 Units/hr at 09/21/15 1024  . hydrochlorothiazide (HYDRODIURIL) tablet 25 mg  25 mg Oral Daily Norman Herrlich, MD   25 mg at 09/21/15 1025  . lisinopril (PRINIVIL,ZESTRIL) tablet 40 mg  40 mg Oral q morning - 10a Norman Herrlich, MD   40 mg at 09/21/15 1025  . nitroGLYCERIN (NITROSTAT) SL tablet 0.4 mg  0.4 mg Sublingual Q5 Min x 3 PRN Norman Herrlich, MD      . ondansetron [facility]) injection 4 mg  4 mg Intravenous Q6H PRN Jettie Booze, MD      . oxyCODONE-acetaminophen (PERCOCET/ROXICET) 5-325 MG per tablet 1-2 tablet  1-2 tablet Oral Q6H PRN Brett Canales, PA-C      . pantoprazole (PROTONIX) EC tablet 40 mg  40 mg Oral Daily Norman Herrlich, MD   40 mg at 09/21/15 1025  .  potassium chloride (K-DUR,KLOR-CON) CR tablet 10 mEq  10 mEq  Oral Daily Belva Crome, MD   10 mEq at 09/21/15 1025  . pramipexole (MIRAPEX) tablet 1 mg  1 mg Oral QHS Norman Herrlich, MD   1 mg at 09/20/15 2127  . prednisoLONE acetate (PRED FORTE) 1 % ophthalmic suspension 1 drop  1 drop Right Eye QID Alphia Moh, MD   1 drop at 09/21/15 1024  . rosuvastatin (CRESTOR) tablet 20 mg  20 mg Oral q1800 Norman Herrlich, MD      . sodium chloride flush (NS) 0.9 % injection 3 mL  3 mL Intravenous Q12H Jettie Booze, MD   3 mL at 09/20/15 1915  . sodium chloride flush (NS) 0.9 % injection 3 mL  3 mL Intravenous PRN Jettie Booze, MD      . timolol (TIMOPTIC) 0.5 % ophthalmic solution 1 drop  1 drop Right Eye QHS Alphia Moh, MD   1 drop at 09/20/15 0846  . venlafaxine (EFFEXOR) tablet 37.5 mg  37.5 mg Oral BID Norman Herrlich, MD   37.5 mg at 09/21/15 [id number]    Prescriptions prior to admission  Medication Sig Dispense Refill Last Dose  . acetaminophen (TYLENOL) 500 MG tablet Take 1,000 mg by mouth every 8 (eight) hours as needed for moderate pain.   09/18/2015 at Unknown time  . apixaban (ELIQUIS) 5 MG TABS tablet Take 1 tablet (5 mg total) by mouth 2 (two) times daily. 60 tablet 11 09/18/2015 at Unknown time  . CARTIA XT 120 MG 24 hr capsule Take 120 mg by mouth daily.  11 09/18/2015 at Unknown time  . Cyanocobalamin (VITAMIN B-12 PO) Take 1 tablet by mouth daily.   09/18/2015 at Unknown time  . ferrous sulfate 325 (65 FE) MG tablet Take 325 mg by mouth every morning.    09/18/2015 at Unknown time  . hydrochlorothiazide (HYDRODIURIL) 25 MG tablet Take 1 tablet (25 mg total) by mouth daily. 90 tablet 3 09/18/2015 at Unknown time  . lisinopril (PRINIVIL,ZESTRIL) 40 MG tablet Take 1 tablet (40 mg total) by mouth every morning. 90 tablet 1 09/18/2015 at Unknown time  . nitrofurantoin (MACRODANTIN) 100 MG capsule Take 100 mg by mouth daily.    09/18/2015 at Unknown time  . pantoprazole (PROTONIX)  40 MG tablet Take 40 mg by mouth daily.    09/18/2015 at Unknown time  . potassium chloride (K-DUR) 10 MEQ tablet TAKE 1 TABLET BY MOUTH DAILY (Patient taking differently: TAKE 10 MEQ BY MOUTH DAILY) 90 tablet 1 09/18/2015 at Unknown time  . pramipexole (MIRAPEX) 1 MG tablet Take 1 mg by mouth at bedtime.    09/18/2015 at Unknown time  . prednisoLONE acetate (PRED FORTE) 1 % ophthalmic suspension INSTILL 1 DROP TO OD QID. START AFTER SURGERY  0 09/18/2015 at Unknown time  . timolol (TIMOPTIC) 0.5 % ophthalmic solution INSTILL 1 DROP OD QAM  0 09/18/2015 at Unknown time  . venlafaxine (EFFEXOR) 75 MG tablet Take 37.5 mg by mouth 2 (two) times daily.    09/18/2015 at Unknown time  . nitroGLYCERIN (NITROSTAT) 0.4 MG SL tablet Place 1 tablet (0.4 mg total) under the tongue every 5 (five) minutes x 3 doses as needed for chest pain. For chest pain (Patient not taking: Reported on 07/29/2015) 25 tablet 6 Not Taking at Unknown time    Family History  Problem Relation Age of Onset  . Heart attack Mother   . Coronary artery disease Brother     had CABG  . Coronary artery disease  Brother   . Coronary artery disease Brother   . Coronary artery disease Brother   . Coronary artery disease Brother   . Heart attack Brother   . Cancer Father      Review of Systems:      Cardiac Review of Systems: Y or N  Chest Pain [n ]  Resting SOB [ y] Exertional SOB  Blue.Reese  ]  Orthopnea Florencio.Farrier  ]   Pedal Edema [ y  ]    Palpitations [ n] Syncope  [ n ]   Presyncope [  n ]  General Review of Systems: [Y] = yes [  ]=no Constitional: recent weight change [n  ]; anorexia [  ]; fatigue [ y ]; nausea [  ]; night sweats [  ]; fever [ n ]; or chills [  ]                                                               Dental: poor dentition[  ]; Last Dentist visit:   Eye : blurred vision [ n ]; diplopia [ n  ]; vision changes [ n];  Amaurosis fugax[n ]; Resp: cough [ n ];  wheezing[ n;  hemoptysis[n  ]; shortness of breath[ y ];  paroxysmal nocturnal dyspnea[ y ]; dyspnea on exertion[ y ]; or orthopnea[ n ];  GI:  gallstones[  ], vomiting[  ];  dysphagia[  ]; melena[  ];  hematochezia [  ]; heartburn[  ];   Hx of  Colonoscopy[  ]; GU: kidney stones [  ]; hematuria[  ];   dysuria [  ];  nocturia[  ];  history of     obstruction [  ]; urinary frequency [  ]             Skin: rash, swelling[  ];, hair loss[  ];  peripheral edema[  ];  or itching[  ]; Musculosketetal: myalgias[  ];  joint swelling[  ];  joint erythema[  ];  joint pain[  ];  back pain[  ];  Heme/Lymph: bruising[  ];  bleeding[  ];  anemia[  ];  Neuro: TIA[ n ];  headaches[ n ];  stroke[  ]n;  vertigo[  ];  seizures[  ];   paresthesias[  ];  difficulty walking[fell last week on eliquis   ];  Psych:depression[  ]; anxiety[  ];  Endocrine: diabetes[ n ];  thyroid dysfunction[?  ];  Immunizations: Flu [  ]; Pneumococcal[  ];  Other:  Physical Exam: BP 140/57 mmHg  Pulse 78  Temp(Src) 97.8 F (36.6 C) (Oral)  Resp 16  Ht 5\' 6"  (1.676 m)  Wt 228 lb 11.2 oz (103.738 kg)  BMI 36.93 kg/m2  SpO2 97%   General appearance: alert, cooperative, appears older than stated age and no distress Head: Normocephalic, without obvious abnormality, atraumatic Neck: no adenopathy, no carotid bruit, no JVD, supple, symmetrical, trachea midline and thyroid not enlarged, symmetric, no tenderness/mass/nodules Lymph nodes: Cervical, supraclavicular, and axillary nodes normal. Resp: clear to auscultation bilaterally Back: symmetric, no curvature. ROM normal. No CVA tenderness. Cardio: regular rate and rhythm, S1, S2 normal, no murmur, click, rub or gallop and no murmur of AS GI: soft, non-tender; bowel sounds normal; no masses,  no organomegaly Extremities: edema  bilaterial 2 + with venous varcosities  and brusing of both knees and right wrist Neurologic: Grossly normal  Diagnostic Studies & Laboratory data:     Recent Radiology Findings:   No results found.   I have  independently reviewed the above radiologic studies.  Recent Lab Findings: Lab Results  Component Value Date   WBC 6.1 09/21/2015   HGB 10.6* 09/21/2015   HCT 32.6* 09/21/2015   PLT 194 09/21/2015   GLUCOSE 94 09/20/2015   CHOL 220* 07/29/2015   TRIG 105 07/29/2015   HDL 44* 07/29/2015   LDLDIRECT 144.0 11/21/2010   LDLCALC 155* 07/29/2015   ALT 66* 09/20/2015   AST 65* 09/20/2015   NA 138 09/20/2015   K 3.9 09/20/2015   CL 105 09/20/2015   CREATININE 1.11* 09/20/2015   BUN 23* 09/20/2015   CO2 22 09/20/2015   TSH 4.769* 09/19/2015   INR 1.11 09/20/2015   Chronic Kidney Disease   Stage I     GFR >90  Stage II    GFR 60-89  Stage IIIA GFR 45-59  Stage IIIB GFR 30-44  Stage IV   GFR 15-29  Stage V    GFR  <15  Lab Results  Component Value Date   CREATININE 1.11* 09/20/2015   Estimated Creatinine Clearance: 54.9 mL/min (by C-G formula based on Cr of 1.11).  echocardiogram: LV EF: 55% - 60%  ------------------------------------------------------------------- Indications: Atrial fibrillation - 427.31.  ------------------------------------------------------------------- History: PMH: Coronary artery disease. Risk factors: Hypertension. Dyslipidemia.  ------------------------------------------------------------------- Study Conclusions  - Left ventricle: The cavity size was normal. There was mild  concentric hypertrophy. Systolic function was normal. The  estimated ejection fraction was in the range of 55% to 60%. Wall  motion was normal; there were no regional wall motion  abnormalities. Doppler parameters are consistent with elevated  ventricular end-diastolic filling pressure. - Aortic valve: There was very mild stenosis. Valve area (VTI):  1.69 cm^2. Valve area (Vmax): 1.51 cm^2. Valve area (Vmean): 1.57  cm^2. - Left atrium: The atrium was mildly dilated. - Atrial septum: No defect or patent foramen ovale was  identified.  Transthoracic echocardiography. M-mode, complete 2D, spectral Doppler, and color Doppler. Birthdate: Patient birthdate: 11-10-1940. Age: Patient is 75 yr old. Sex: Gender: female. BMI: 36.9 kg/m^2. Blood pressure: 140/70 Patient status: Inpatient. Study date: Study date: 09/21/2015. Study time: 11:26 AM. Location: Bedside.  -------------------------------------------------------------------  ------------------------------------------------------------------- Left ventricle: The cavity size was normal. There was mild concentric hypertrophy. Systolic function was normal. The estimated ejection fraction was in the range of 55% to 60%. Wall motion was normal; there were no regional wall motion abnormalities. Doppler parameters are consistent with elevated ventricular end-diastolic filling pressure.  ------------------------------------------------------------------- Aortic valve: Trileaflet; normal thickness, mildly calcified leaflets. Mobility was not restricted. Doppler: There was very mild stenosis. There was no regurgitation. VTI ratio of LVOT to aortic valve: 0.6. Valve area (VTI): 1.69 cm^2. Indexed valve area (VTI): 0.75 cm^2/m^2. Peak velocity ratio of LVOT to aortic valve: 0.53. Valve area (Vmax): 1.51 cm^2. Indexed valve area (Vmax): 0.67 cm^2/m^2. Mean velocity ratio of LVOT to aortic valve: 0.55. Valve area (Vmean): 1.57 cm^2. Indexed valve area (Vmean): 0.7 cm^2/m^2. Mean gradient (S): 12 mm Hg. Peak gradient (S): 25 mm Hg.  ------------------------------------------------------------------- Aorta: The aorta was normal, not dilated, and non-diseased. Aortic root: The aortic root was normal in size.  ------------------------------------------------------------------- Mitral valve: Mildly thickened leaflets . Mobility was not restricted. Doppler: Transvalvular velocity was within the normal range. There was no  evidence  for stenosis. There was no significant regurgitation. Peak gradient (D): 7 mm Hg.  ------------------------------------------------------------------- Left atrium: The atrium was mildly dilated.  ------------------------------------------------------------------- Atrial septum: No defect or patent foramen ovale was identified.  ------------------------------------------------------------------- Right ventricle: The cavity size was normal. Wall thickness was normal. Systolic function was normal.  ------------------------------------------------------------------- Pulmonic valve: Doppler: Transvalvular velocity was within the normal range. There was no evidence for stenosis.  ------------------------------------------------------------------- Tricuspid valve: Structurally normal valve. Doppler: Transvalvular velocity was within the normal range. There was trivial regurgitation.  ------------------------------------------------------------------- Pulmonary artery: The main pulmonary artery was normal-sized. Systolic pressure was within the normal range.  ------------------------------------------------------------------- Right atrium: The atrium was normal in size.  ------------------------------------------------------------------- Pericardium: The pericardium was normal in appearance. There was no pericardial effusion.  ------------------------------------------------------------------- Systemic veins: Inferior vena cava: The vessel was normal in size. The respirophasic diameter changes were in the normal range (>= 50%), consistent with normal central venous pressure.  ------------------------------------------------------------------- Post procedure conclusions Ascending Aorta:  - The aorta was normal, not dilated, and non-diseased.  ------------------------------------------------------------------- Measurements  Left  ventricle Value Reference LV ID, ED, PLAX chordal (L) 39.9 mm 43 - 52 LV ID, ES, PLAX chordal 27.7 mm 23 - 38 LV fx shortening, PLAX chordal 31 % >=29 LV PW thickness, ED 13.2 mm --------- IVS/LV PW ratio, ED 0.97 <=1.3 Stroke volume, 2D 89 ml --------- Stroke volume/bsa, 2D 40 ml/m^2 --------- LV ejection fraction, 1-p A4C 62 % --------- LV end-diastolic volume, 2-p 65 ml --------- LV end-systolic volume, 2-p 26 ml --------- LV ejection fraction, 2-p 60 % --------- Stroke volume, 2-p 39 ml --------- LV end-diastolic volume/bsa, 2-p 29 ml/m^2 --------- LV end-systolic volume/bsa, 2-p 11 ml/m^2 --------- Stroke volume/bsa, 2-p 17.4 ml/m^2 --------- LV e&', lateral 7.94 cm/s --------- LV E/e&', lateral 16.25 --------- LV s&', lateral 6.4 cm/s --------- LV e&', medial 7.72 cm/s --------- LV E/e&', medial 16.71 --------- LV e&', average 7.83 cm/s --------- LV E/e&', average 16.48 ---------  Ventricular septum Value Reference IVS thickness, ED 12.8 mm ---------  LVOT Value Reference LVOT ID, S 19 mm --------- LVOT area 2.84 cm^2 --------- LVOT peak velocity,  S 134 cm/s --------- LVOT mean velocity, S 86.4 cm/s --------- LVOT VTI, S 31.4 cm --------- LVOT peak gradient, S 7 mm Hg ---------  Aortic valve Value Reference Aortic valve peak velocity, S 252 cm/s --------- Aortic valve mean velocity, S 156 cm/s --------- Aortic valve VTI, S 52.7 cm --------- Aortic mean gradient, S 12 mm Hg --------- Aortic peak gradient, S 25 mm Hg --------- VTI ratio, LVOT/AV 0.6 --------- Aortic valve area, VTI 1.69 cm^2 --------- Aortic valve area/bsa, VTI 0.75 cm^2/m^2 --------- Velocity ratio, peak, LVOT/AV 0.53 --------- Aortic valve area, peak velocity 1.51 cm^2 --------- Aortic valve area/bsa, peak 0.67 cm^2/m^2 --------- velocity Velocity ratio, mean, LVOT/AV 0.55 --------- Aortic valve area, mean velocity 1.57 cm^2 --------- Aortic valve area/bsa, mean 0.7 cm^2/m^2 --------- velocity  Aorta Value Reference Aortic root ID, ED 31 mm ---------  Left atrium Value Reference LA ID, A-P, ES 39 mm --------- LA ID/bsa, A-P 1.74 cm/m^2 <=2.2 LA volume, S 74 ml --------- LA volume/bsa, S 33 ml/m^2 --------- LA volume, ES, 1-p A4C 68.8 ml --------- LA volume/bsa, ES, 1-p A4C 30.7 ml/m^2 --------- LA  volume, ES, 1-p A2C 75.6 ml --------- LA volume/bsa, ES, 1-p A2C 33.7 ml/m^2 ---------  Mitral valve Value Reference Mitral E-wave peak velocity 129 cm/s --------- Mitral A-wave peak velocity 94.6 cm/s --------- Mitral deceleration time (H) 268 ms 150 - 230 Mitral peak gradient,  D 7 mm Hg --------- Mitral E/A ratio, peak 1.4 ---------  Pulmonary arteries Value Reference PA pressure, S, DP 23 mm Hg <=30  Tricuspid valve Value Reference Tricuspid regurg peak velocity 225 cm/s --------- Tricuspid peak RV-RA gradient 20 mm Hg ---------  Systemic veins Value Reference Estimated CVP 3 mm Hg ---------  Right ventricle Value Reference TAPSE 24.9 mm --------- RV pressure, S, DP 23 mm Hg <=30 RV s&', lateral, S 11.4 cm/s ---------  Legend: (L) and (H) mark values outside specified reference range.  ------------------------------------------------------------------- Prepared and Electronically Authenticated by  Jenkins Rouge, M.D. 2017-05-03T12:32:53  Cardiac Cath: Conclusion     Ost LAD to Prox LAD lesion, 50% stenosed. Mid LAD lesion, 60% stenosed. the FFR across this disease was 0.77.  Mid RCA lesion, 50% stenosed. The FFR across this lesion was 0.79.  Mild aortic valve gradient.  Mildly elevated LVEDP  LAD disease is not ideal for PCI given the ostial nature of this lesion. There would have to be some stent into the  short left main, which could compromise the circumflex. The RCA lesion is amenable to PCI. Will obtain surgical consultation per Dr. Tamala Julian to evaluate this option versus medical therapy, as her symptoms are controlled when her rhythm is controlled.    On my review the ostium of LAD looks much more severe then 50%   I have independently reviewed the above  cath films and reviewed the findings with the  patient .   Assessment / Plan:    Coronary artery disease , especially involving ostial LAD ( more then 80% ) , less significant disease of right - I have recommended to her that we at least proceed with LIMA to LAD , poss off pump. Can consider atrial clip with intermittent afib and /or pulmonary vein isolation . Stent of RCA could be done later as needed   Patient was  Eliquis so will need to wait 5 days for Eliquis wash out  ( poss Friday surgery)      I  spent 40 minutes counseling the patient face to face and 50% or more the  time was spent in counseling and coordination of care. The total time spent in the appointment was 60 minutes.    Grace Isaac MD      [address] [place] [address] Office [phone]   Beeper [phone]  09/21/2015 2:54 PM

## 2015-09-21 NOTE — Progress Notes (Signed)
Patient Name: [name] Date of Encounter: 09/21/2015    SUBJECTIVE: Pt having pain from cath site on rt forearm that kept her up at night. Denies CP, palpitations, and SOB.   TELEMETRY:  NSR, 1 episode of 3 beats of Vtach, asymptomatic  Filed Vitals:   09/20/15 1843 09/20/15 1857 09/20/15 1935 09/21/15 0500  BP: 136/42 135/50 126/45 146/69  Pulse: 74 71 72 74  Temp:   98.3 F (36.8 C) 97.8 F (36.6 C)  TempSrc:   Oral Oral  Resp:      Height:      Weight:    228 lb 11.2 oz (103.738 kg)  SpO2: 94% 97% 95% 97%    Intake/Output Summary (Last 24 hours) at 09/21/15 0825 Last data filed at 09/20/15 1300  Gross per 24 hour  Intake 562.13 ml  Output      0 ml  Net 562.13 ml   LABS: Basic Metabolic Panel:  Recent Labs  09/19/15 0432 09/20/15 0005  NA 139 138  K 3.8 3.9  CL 109 105  CO2 21* 22  GLUCOSE 129* 94  BUN 23* 23*  CREATININE 1.12* 1.11*  CALCIUM 9.3 9.5   CBC:  Recent Labs  09/19/15 0432 09/20/15 0005 09/21/15 0355  WBC 5.8 8.0 6.1  NEUTROABS 3.3  --   --   HGB 11.2* 10.5* 10.6*  HCT 34.2* 33.3* 32.6*  MCV 90.5 89.0 91.1  PLT 195 214 194   Cardiac Enzymes:  Recent Labs  09/19/15 1155 09/19/15 1804 09/20/15 0005  TROPONINI 0.06* 0.06* 0.06*    Physical Exam: Blood pressure 146/69, pulse 74, temperature 97.8 F (36.6 C), temperature source Oral, resp. rate 18, height 5\' 6"  (1.676 m), weight 228 lb 11.2 oz (103.738 kg), SpO2 97 %. Weight change: 14.4 oz (0.408 kg)  Wt Readings from Last 3 Encounters:  09/21/15 228 lb 11.2 oz (103.738 kg)  09/11/15 225 lb (102.059 kg)  07/29/15 230 lb 3.2 oz (104.418 kg)    Physical Exam  Constitutional: She appears well-developed and well-nourished. No distress.  HENT:  Head: Normocephalic and atraumatic.  Nose: Nose normal.  Eyes: EOM are normal. No scleral icterus.  Cardiovascular: Normal rate, regular rhythm and normal heart sounds.  Exam reveals no gallop and no friction rub.   No  murmur heard. Pulmonary/Chest: Effort normal and breath sounds normal. No respiratory distress. She has no wheezes. She has no rales. She exhibits no tenderness.  Abdominal: Soft. Bowel sounds are normal. She exhibits no distension and no mass. There is no tenderness. There is no rebound and no guarding.  Skin: Skin is warm and dry. No rash noted. She is not diaphoretic. No erythema. No pallor.  Distal rt forearm with blue/purple bruise at cath insertion site that is TTP, no bleeding    ASSESSMENT: 75 year old female presenting with a funny sensation that she gets when she has atrial fibrillation that was associated w/ SOB and leg weakness. Unclear cause of patients symptomatic atrial fibrillation, likely multifactorial due to non compliance w/ CPAP, volume overload, recent viral illness over the weekend, and thyroid disease.   PLAN: 1. Atrial fibrillation- NSR w/ rate contolled.  2. Coronary artery disease -stent to RCA in 2009. Normal myoview in 07/2013, not having any chest pain. - cath procedure yesterday revealed Ost LAD to Prox LAD lesion, 50% stenosed. Mid LAD lesion, 60% stenosed, and mid RCA 50% stenosis. Her LAD disease is not ideal for PCI given  the ostial nature of this lesion. There would have to be some stent into the short left main, which could compromise the circumflex. The RCA lesion is amenable to PCI. Dr. Tamala Julian to decide possible surgical consultation vs medical treatment - hgb stable s/p cath, will give pt tramadol 50mg  once for forearm pain.  3. HTN - continue home HCTZ 25mg  daily and lisinopril 40mg  daily 4. Obstructive sleep apnea - unable to tolerate CPAP, could be culprit of patients afib  5. GERD- reports having bad acid reflux and sleeping with head elevated due to this - continue home protonix 40mg  BID 6. HLD- LDL 155 on 07/29/2015, it seems like she was on crestor 10mg . However she brought in all of her medications with her today and it was not in her medication bag  nor her med list on EPIC. She is on diltiazem which has a drug interaction w/ atorvastatin.  - crestor 20mg  qd.  7.Hx of multinodular goiter s/p radioactive iodine tx in 2015 - checking TSH -- came back elevated at 4.769 w/ nl FT4 consistent w/ subclinical hypothyroidism. F/u with Dr. Buddy Duty as outpatient    Signed, Julious Oka 09/21/2015, 8:25 AM  The patient has been seen in conjunction with Julious Oka, M.D. All aspects of care have been considered and discussed. The patient has been personally interviewed, examined, and all clinical data has been reviewed.   Long discussion with the patient concerning findings at coronary angiography. She has hemodynamically significant LAD and right coronary obstructive disease. Angiographically of the disease is moderate to moderately severe with 70% ostial LAD followed by 50% stenosis and 60-70% mid RCA. Both vessels have FFR less than 0.8.  Discussed with Dr. Servando Snare. Discussed with Dr. Irish Lack.  Plan to discuss further, but I have recommended to the patient that we consider LIMA to LAD and either SVG or drug-eluting stent to RCA. I would like for her to have even maze or atrial appendage ligation because of her history of atrial fib.

## 2015-09-22 ENCOUNTER — Inpatient Hospital Stay (HOSPITAL_COMMUNITY): Payer: Medicare Other

## 2015-09-22 DIAGNOSIS — I25110 Atherosclerotic heart disease of native coronary artery with unstable angina pectoris: Secondary | ICD-10-CM

## 2015-09-22 DIAGNOSIS — I2510 Atherosclerotic heart disease of native coronary artery without angina pectoris: Secondary | ICD-10-CM

## 2015-09-22 LAB — URINALYSIS, ROUTINE W REFLEX MICROSCOPIC
Bilirubin Urine: NEGATIVE
Glucose, UA: NEGATIVE mg/dL
Hgb urine dipstick: NEGATIVE
Ketones, ur: NEGATIVE mg/dL
Nitrite: NEGATIVE
Protein, ur: NEGATIVE mg/dL
Specific Gravity, Urine: 1.01 (ref 1.005–1.030)
pH: 6.5 (ref 5.0–8.0)

## 2015-09-22 LAB — BLOOD GAS, ARTERIAL
Acid-base deficit: 0.9 mmol/L (ref 0.0–2.0)
Bicarbonate: 22.8 mEq/L (ref 20.0–24.0)
Drawn by: 270221
FIO2: 0.21
O2 Saturation: 96.6 %
Patient temperature: 98.6
TCO2: 23.9 mmol/L (ref 0–100)
pCO2 arterial: 35 mmHg (ref 35.0–45.0)
pH, Arterial: 7.43 (ref 7.350–7.450)
pO2, Arterial: 81.9 mmHg (ref 80.0–100.0)

## 2015-09-22 LAB — HEPARIN LEVEL (UNFRACTIONATED): HEPARIN UNFRACTIONATED: 0.36 [IU]/mL (ref 0.30–0.70)

## 2015-09-22 LAB — BASIC METABOLIC PANEL
ANION GAP: 9 (ref 5–15)
BUN: 13 mg/dL (ref 6–20)
CHLORIDE: 107 mmol/L (ref 101–111)
CO2: 21 mmol/L — ABNORMAL LOW (ref 22–32)
CREATININE: 1.05 mg/dL — AB (ref 0.44–1.00)
Calcium: 9.6 mg/dL (ref 8.9–10.3)
GFR calc non Af Amer: 51 mL/min — ABNORMAL LOW (ref >60)
GFR, EST AFRICAN AMERICAN: 60 mL/min — AB (ref >60)
Glucose, Bld: 102 mg/dL — ABNORMAL HIGH (ref 65–99)
POTASSIUM: 3.8 mmol/L (ref 3.5–5.1)
SODIUM: 137 mmol/L (ref 135–145)

## 2015-09-22 LAB — URINE MICROSCOPIC-ADD ON: RBC / HPF: NONE SEEN RBC/hpf (ref 0–5)

## 2015-09-22 LAB — TYPE AND SCREEN
ABO/RH(D): O POS
Antibody Screen: NEGATIVE

## 2015-09-22 LAB — CBC
HCT: 32.4 % — ABNORMAL LOW (ref 36.0–46.0)
Hemoglobin: 10.4 g/dL — ABNORMAL LOW (ref 12.0–15.0)
MCH: 29.6 pg (ref 26.0–34.0)
MCHC: 32.1 g/dL (ref 30.0–36.0)
MCV: 92.3 fL (ref 78.0–100.0)
PLATELETS: 191 10*3/uL (ref 150–400)
RBC: 3.51 MIL/uL — ABNORMAL LOW (ref 3.87–5.11)
RDW: 15.1 % (ref 11.5–15.5)
WBC: 6.5 10*3/uL (ref 4.0–10.5)

## 2015-09-22 LAB — ABO/RH: ABO/RH(D): O POS

## 2015-09-22 LAB — SURGICAL PCR SCREEN
MRSA, PCR: NEGATIVE
Staphylococcus aureus: POSITIVE — AB

## 2015-09-22 LAB — MAGNESIUM: Magnesium: 1.9 mg/dL (ref 1.7–2.4)

## 2015-09-22 MED ORDER — DEXTROSE 5 % IV SOLN
750.0000 mg | INTRAVENOUS | Status: DC
  Filled 2015-09-22 (×2): qty 750

## 2015-09-22 MED ORDER — SODIUM CHLORIDE 0.9 % IV SOLN
INTRAVENOUS | Status: DC
  Filled 2015-09-22: qty 30

## 2015-09-22 MED ORDER — VANCOMYCIN HCL 10 G IV SOLR
1500.0000 mg | INTRAVENOUS | Status: DC
  Filled 2015-09-22: qty 1500

## 2015-09-22 MED ORDER — TEMAZEPAM 15 MG PO CAPS
15.0000 mg | ORAL_CAPSULE | Freq: Once | ORAL | Status: AC | PRN
  Administered 2015-09-23: 15 mg via ORAL
  Filled 2015-09-22: qty 1

## 2015-09-22 MED ORDER — BISACODYL 5 MG PO TBEC
5.0000 mg | DELAYED_RELEASE_TABLET | Freq: Once | ORAL | Status: AC
  Administered 2015-09-22: 5 mg via ORAL
  Filled 2015-09-22: qty 1

## 2015-09-22 MED ORDER — INSULIN REGULAR HUMAN 100 UNIT/ML IJ SOLN
INTRAMUSCULAR | Status: DC
Start: 2015-09-23 — End: 2015-09-23
  Filled 2015-09-22: qty 2.5

## 2015-09-22 MED ORDER — NITROGLYCERIN IN D5W 200-5 MCG/ML-% IV SOLN
2.0000 ug/min | INTRAVENOUS | Status: DC
  Filled 2015-09-22: qty 250

## 2015-09-22 MED ORDER — METOPROLOL TARTRATE 12.5 MG HALF TABLET
12.5000 mg | ORAL_TABLET | Freq: Once | ORAL | Status: AC
  Administered 2015-09-23: 12.5 mg via ORAL
  Filled 2015-09-22: qty 1

## 2015-09-22 MED ORDER — PHENYLEPHRINE HCL 10 MG/ML IJ SOLN
30.0000 ug/min | INTRAMUSCULAR | Status: DC
  Filled 2015-09-22: qty 2

## 2015-09-22 MED ORDER — DOPAMINE-DEXTROSE 3.2-5 MG/ML-% IV SOLN
0.0000 ug/kg/min | INTRAVENOUS | Status: DC
  Filled 2015-09-22: qty 250

## 2015-09-22 MED ORDER — CHLORHEXIDINE GLUCONATE 0.12 % MT SOLN
15.0000 mL | Freq: Once | OROMUCOSAL | Status: AC
  Administered 2015-09-23: 15 mL via OROMUCOSAL
  Filled 2015-09-22: qty 15

## 2015-09-22 MED ORDER — PLASMA-LYTE 148 IV SOLN
INTRAVENOUS | Status: DC
  Filled 2015-09-22: qty 2.5

## 2015-09-22 MED ORDER — CHLORHEXIDINE GLUCONATE CLOTH 2 % EX PADS
6.0000 | MEDICATED_PAD | Freq: Once | CUTANEOUS | Status: AC
  Administered 2015-09-22: 6 via TOPICAL

## 2015-09-22 MED ORDER — MAGNESIUM SULFATE 50 % IJ SOLN
40.0000 meq | INTRAMUSCULAR | Status: DC
  Filled 2015-09-22: qty 10

## 2015-09-22 MED ORDER — EPINEPHRINE HCL 1 MG/ML IJ SOLN
0.0000 ug/min | INTRAVENOUS | Status: DC
  Filled 2015-09-22: qty 4

## 2015-09-22 MED ORDER — SODIUM CHLORIDE 0.9 % IV SOLN
INTRAVENOUS | Status: DC
  Filled 2015-09-22: qty 40

## 2015-09-22 MED ORDER — DEXTROSE 5 % IV SOLN
1.5000 g | INTRAVENOUS | Status: DC
  Filled 2015-09-22 (×2): qty 1.5

## 2015-09-22 MED ORDER — DEXMEDETOMIDINE HCL IN NACL 400 MCG/100ML IV SOLN
0.1000 ug/kg/h | INTRAVENOUS | Status: DC
  Filled 2015-09-22: qty 100

## 2015-09-22 MED ORDER — CHLORHEXIDINE GLUCONATE CLOTH 2 % EX PADS
6.0000 | MEDICATED_PAD | Freq: Once | CUTANEOUS | Status: AC
  Administered 2015-09-23: 6 via TOPICAL

## 2015-09-22 MED ORDER — POTASSIUM CHLORIDE 2 MEQ/ML IV SOLN
80.0000 meq | INTRAVENOUS | Status: DC
  Filled 2015-09-22: qty 40

## 2015-09-22 MED ORDER — MORPHINE SULFATE (PF) 2 MG/ML IV SOLN: 2.0000 mg | INTRAVENOUS | Status: DC | PRN

## 2015-09-22 MED ORDER — ALPRAZOLAM 0.25 MG PO TABS: 0.2500 mg | ORAL_TABLET | ORAL | Status: DC | PRN

## 2015-09-22 NOTE — Consult Note (Signed)
[address]       [place] [address]             [id number]        [name] Medical Record [id number] Date of Birth: 05/01/1941  Referring:Dr Linard Millers. Primary Care: Donnie Coffin, MD  Chief Complaint:    Chief Complaint  Patient presents with  . Chest Pain  . Atrial Fibrillation    History of Present Illness:     75 year old female presenting with a funny sensation that she gets when she has atrial fibrillation that was associated w/ SOB and leg weakness. She called EMS 3am Monday and was brought to hospital . She was found to be in afib w/ HR 149 that self resolved, she is now in NSR and feels back at her baseline. Unclear cause of patients symptomatic atrial fibrillation, likely multifactorial due to non compliance w/ CPAP, volume overload, recent viral illness over the weekend, and thyroid disease.  Patient has noted for months increasing dyspnea on exertion. Has been on elliquis for episodic afib    Cardiology reviewed  all data including ST segment depression during atrial fibrillation, prior history of circumflex stenting 2009, strong family history of CAD, and trace positive troponin, and decided  Decided to proceed with  coronary angiography  to exclude the possibility of an ischemic origin for atrial fibrillation.   Current Activity/ Functional Status: Patient is independent with mobility/ambulation, transfers, ADL's, IADL's.   Zubrod Score: At the time of surgery this patient's most appropriate activity status/level should be described as: []     0    Normal activity, no symptoms [x]     1    Restricted in physical strenuous activity but ambulatory, able to do out light work []     2    Ambulatory and capable of self care, unable to do work activities, up and about                 more than 50%  Of the time                            []     3    Only limited self care, in bed greater than 50% of waking hours []     4    Completely  disabled, no self care, confined to bed or chair []     5    Moribund  Past Medical History  Diagnosis Date  . Hyperlipidemia   . Fatigue   . Anxiety   . Coronary artery disease     a. s/p MI 2009 - PCI/DES distal  RCA w/ 2.75 x 18 Xience DES;  b. 05/2010 Myoview: Apical thinning, EF 73%;  c. 06/2010 Cath: moderate nonobs dzs, EF 65%;  d.  Lexiscan Myoview (07/2013):  No scar or ischemia, EF 67%; Normal Study  . Fibromyalgia   . Essential iris atrophy     Right side  . Menopause   . Glaucoma   . Environmental allergies   . GERD (gastroesophageal reflux disease)   . Osteoarthritis   . OSA (obstructive sleep apnea)   . Obesity (BMI 30-39.9)   . Hypertension     Past Surgical History  Procedure Laterality Date  . Cardiac catheterization  06/29/2010    Mild to moderate coronary artery irregularities.  Her  proximal left anterior descending artery is moderately narrowed.  She  also has an eccentric stenosis in the proximal LAD.  These do not appear  to obstruct flow at present, but they are fairly small vessels.  They  appeared to be between 1.5 and 2 mm in diamete  . Cardiac catheterization  08/02/2009    Patent stent  . Cardiac catheterization  05/12/2008    Stent to the distal RCA  . Cardiac catheterization N/A 09/20/2015    Procedure: Left Heart Cath and Coronary Angiography;  Surgeon: Jettie Booze, MD;  Location: [facility];  Service: Cardiovascular;  Laterality: N/A;  . Cardiac catheterization  09/20/2015    Procedure: Intravascular Ultrasound/IVUS;  Surgeon: Jettie Booze, MD;  Location: [facility];  Service: Cardiovascular;;  . Cardiac catheterization  09/20/2015    Procedure: Intravascular Pressure Wire/FFR Study;  Surgeon: Jettie Booze, MD;  Location: [facility];  Service: Cardiovascular;;    History  Smoking status  . Former Smoker  . Types: Cigarettes  . Quit date: 09/13/1960  Smokeless tobacco  . Never Used    History  Alcohol Use  No    Social History   Social History  . Marital Status: Divorced    Spouse Name: N/A  . Number of Children: N/A  . Years of Education: N/A   Occupational History  . Not on file.   Social History Main Topics  . Smoking status: Former Smoker    Types: Cigarettes    Quit date: 09/13/1960  . Smokeless tobacco: Never Used  . Alcohol Use: No  . Drug Use: No  . Sexual Activity: Not on file   Other Topics Concern  . Not on file   Social History Narrative    Allergies  Allergen Reactions  . Cortisone Nausea Only and Other (See Comments)    Pt gets very hot and flushed  . Sulfamethoxazole-Trimethoprim Other (See Comments)    Reaction: unknown    Current Facility-Administered Medications  Medication Dose Route Frequency Provider Last Rate Last Dose  . 0.9 %  sodium chloride infusion  250 mL Intravenous PRN Jettie Booze, MD      . acetaminophen (TYLENOL) tablet 650 mg  650 mg Oral Q4H PRN Jettie Booze, MD   650 mg at 09/21/15 0755  . ALPRAZolam (XANAX) tablet 0.25-0.5 mg  0.25-0.5 mg Oral Q4H PRN Grace Isaac, MD      . Derrill Memo ON 09/23/2015] aminocaproic acid (AMICAR) 10 g in sodium chloride 0.9 % 100 mL infusion   Intravenous To OR Grace Isaac, MD      . Derrill Memo ON 09/23/2015] cefUROXime (ZINACEF) 1.5 g in dextrose 5 % 50 mL IVPB  1.5 g Intravenous To OR Grace Isaac, MD      . Derrill Memo ON 09/23/2015] cefUROXime (ZINACEF) 750 mg in dextrose 5 % 50 mL IVPB  750 mg Intravenous To OR Grace Isaac, MD      . Derrill Memo ON 09/23/2015] chlorhexidine (PERIDEX) 0.12 % solution 15 mL  15 mL Mouth/Throat Once Grace Isaac, MD      . Chlorhexidine Gluconate Cloth 2 % PADS 6 each  6 each Topical Once Grace Isaac, MD       And  . Derrill Memo ON 09/23/2015] Chlorhexidine Gluconate Cloth 2 % PADS 6 each  6 each Topical Once Grace Isaac, MD      . Derrill Memo ON 09/23/2015] dexmedetomidine (PRECEDEX) 400 MCG/100ML (4 mcg/mL) infusion  0.1-0.7 mcg/kg/hr Intravenous To OR  Grace Isaac,  MD      . diltiazem (CARDIZEM CD) 24 hr capsule 120 mg  120 mg Oral Daily Norman Herrlich, MD   120 mg at 09/22/15 0733  . [START ON 09/23/2015] DOPamine (INTROPIN) 800 mg in dextrose 5 % 250 mL (3.2 mg/mL) infusion  0-10 mcg/kg/min Intravenous To OR Grace Isaac, MD      . Derrill Memo ON 09/23/2015] EPINEPHrine (ADRENALIN) 4 mg in dextrose 5 % 250 mL (0.016 mg/mL) infusion  0-10 mcg/min Intravenous To OR Grace Isaac, MD      . Derrill Memo ON 09/23/2015] heparin 2,500 Units, papaverine 30 mg in electrolyte-148 (PLASMALYTE-148) 500 mL irrigation   Irrigation To OR Grace Isaac, MD      . Derrill Memo ON 09/23/2015] heparin 30,000 units/NS 1000 mL solution for CELLSAVER   Other To OR Grace Isaac, MD      . heparin ADULT infusion 100 units/mL (25000 units/250 mL)  1,400 Units/hr Intravenous Continuous Jettie Booze, MD 14 mL/hr at 09/21/15 1024 1,400 Units/hr at 09/21/15 1024  . hydrochlorothiazide (HYDRODIURIL) tablet 25 mg  25 mg Oral Daily Norman Herrlich, MD   25 mg at 09/22/15 [id number]  . [START ON 09/23/2015] insulin regular (NOVOLIN R,HUMULIN R) 250 Units in sodium chloride 0.9 % 250 mL (1 Units/mL) infusion   Intravenous To OR Grace Isaac, MD      . lisinopril (PRINIVIL,ZESTRIL) tablet 40 mg  40 mg Oral q morning - 10a Norman Herrlich, MD   40 mg at 09/22/15 [id number]  . [START ON 09/23/2015] magnesium sulfate (IV Push/IM) injection 40 mEq  40 mEq Other To OR Grace Isaac, MD      . Derrill Memo ON 09/23/2015] metoprolol tartrate (LOPRESSOR) tablet 12.5 mg  12.5 mg Oral Once Grace Isaac, MD      . morphine 2 MG/ML injection 2 mg  2 mg Intravenous Q4H PRN Erma Heritage, PA      . nitroGLYCERIN (NITROSTAT) SL tablet 0.4 mg  0.4 mg Sublingual Q5 Min x 3 PRN Norman Herrlich, MD   0.4 mg at 09/22/15 [id number]  . [START ON 09/23/2015] nitroGLYCERIN 50 mg in dextrose 5 % 250 mL (0.2 mg/mL) infusion  2-200 mcg/min Intravenous To OR Grace Isaac, MD      . ondansetron [facility]) injection  4 mg  4 mg Intravenous Q6H PRN Jettie Booze, MD      . oxyCODONE-acetaminophen (PERCOCET/ROXICET) 5-325 MG per tablet 1-2 tablet  1-2 tablet Oral Q6H PRN Brett Canales, PA-C      . pantoprazole (PROTONIX) EC tablet 40 mg  40 mg Oral Daily Norman Herrlich, MD   40 mg at 09/22/15 [id number]  . [START ON 09/23/2015] phenylephrine (NEO-SYNEPHRINE) 20 mg in dextrose 5 % 250 mL (0.08 mg/mL) infusion  30-200 mcg/min Intravenous To OR Grace Isaac, MD      . potassium chloride (K-DUR,KLOR-CON) CR tablet 10 mEq  10 mEq Oral Daily Belva Crome, MD   10 mEq at 09/22/15 0733  . [START ON 09/23/2015] potassium chloride injection 80 mEq  80 mEq Other To OR Grace Isaac, MD      . pramipexole (MIRAPEX) tablet 1 mg  1 mg Oral QHS Norman Herrlich, MD   1 mg at 09/21/15 2142  . prednisoLONE acetate (PRED FORTE) 1 % ophthalmic suspension 1 drop  1 drop Right Eye QID Alphia Moh, MD   1 drop at 09/22/15 1646  . rosuvastatin (  CRESTOR) tablet 20 mg  20 mg Oral q1800 Norman Herrlich, MD   20 mg at 09/22/15 1645  . sodium chloride flush (NS) 0.9 % injection 3 mL  3 mL Intravenous Q12H Jettie Booze, MD   3 mL at 09/20/15 1915  . sodium chloride flush (NS) 0.9 % injection 3 mL  3 mL Intravenous PRN Jettie Booze, MD      . temazepam (RESTORIL) capsule 15 mg  15 mg Oral Once PRN Grace Isaac, MD      . timolol (TIMOPTIC) 0.5 % ophthalmic solution 1 drop  1 drop Right Eye QHS Alphia Moh, MD   1 drop at 09/22/15 0734  . [START ON 09/23/2015] vancomycin (VANCOCIN) 1,500 mg in sodium chloride 0.9 % 250 mL IVPB  1,500 mg Intravenous To OR Grace Isaac, MD      . venlafaxine [facility]) tablet 37.5 mg  37.5 mg Oral BID Norman Herrlich, MD   37.5 mg at 09/22/15 1645    Prescriptions prior to admission  Medication Sig Dispense Refill Last Dose  . acetaminophen (TYLENOL) 500 MG tablet Take 1,000 mg by mouth every 8 (eight) hours as needed for moderate pain.   09/18/2015 at Unknown time  . apixaban  (ELIQUIS) 5 MG TABS tablet Take 1 tablet (5 mg total) by mouth 2 (two) times daily. 60 tablet 11 09/18/2015 at Unknown time  . CARTIA XT 120 MG 24 hr capsule Take 120 mg by mouth daily.  11 09/18/2015 at Unknown time  . Cyanocobalamin (VITAMIN B-12 PO) Take 1 tablet by mouth daily.   09/18/2015 at Unknown time  . ferrous sulfate 325 (65 FE) MG tablet Take 325 mg by mouth every morning.    09/18/2015 at Unknown time  . hydrochlorothiazide (HYDRODIURIL) 25 MG tablet Take 1 tablet (25 mg total) by mouth daily. 90 tablet 3 09/18/2015 at Unknown time  . lisinopril (PRINIVIL,ZESTRIL) 40 MG tablet Take 1 tablet (40 mg total) by mouth every morning. 90 tablet 1 09/18/2015 at Unknown time  . nitrofurantoin (MACRODANTIN) 100 MG capsule Take 100 mg by mouth daily.    09/18/2015 at Unknown time  . pantoprazole (PROTONIX) 40 MG tablet Take 40 mg by mouth daily.    09/18/2015 at Unknown time  . potassium chloride (K-DUR) 10 MEQ tablet TAKE 1 TABLET BY MOUTH DAILY (Patient taking differently: TAKE 10 MEQ BY MOUTH DAILY) 90 tablet 1 09/18/2015 at Unknown time  . pramipexole (MIRAPEX) 1 MG tablet Take 1 mg by mouth at bedtime.    09/18/2015 at Unknown time  . prednisoLONE acetate (PRED FORTE) 1 % ophthalmic suspension INSTILL 1 DROP TO OD QID. START AFTER SURGERY  0 09/18/2015 at Unknown time  . timolol (TIMOPTIC) 0.5 % ophthalmic solution INSTILL 1 DROP OD QAM  0 09/18/2015 at Unknown time  . venlafaxine (EFFEXOR) 75 MG tablet Take 37.5 mg by mouth 2 (two) times daily.    09/18/2015 at Unknown time  . nitroGLYCERIN (NITROSTAT) 0.4 MG SL tablet Place 1 tablet (0.4 mg total) under the tongue every 5 (five) minutes x 3 doses as needed for chest pain. For chest pain (Patient not taking: Reported on 07/29/2015) 25 tablet 6 Not Taking at Unknown time    Family History  Problem Relation Age of Onset  . Heart attack Mother   . Coronary artery disease Brother     had CABG  . Coronary artery disease Brother   . Coronary artery  disease Brother   .  Coronary artery disease Brother   . Coronary artery disease Brother   . Heart attack Brother   . Cancer Father      Review of Systems:      Cardiac Review of Systems: Y or N  Chest Pain [n ]  Resting SOB [ y] Exertional SOB  Blue.Reese  ]  Orthopnea Florencio.Farrier  ]   Pedal Edema [ y  ]    Palpitations [ n] Syncope  [ n ]   Presyncope [  n ]  General Review of Systems: [Y] = yes [  ]=no Constitional: recent weight change [n  ]; anorexia [  ]; fatigue [ y ]; nausea [  ]; night sweats [  ]; fever [ n ]; or chills [  ]                                                               Dental: poor dentition[  ]; Last Dentist visit:   Eye : blurred vision [ n ]; diplopia [ n  ]; vision changes [ n];  Amaurosis fugax[n ]; Resp: cough [ n ];  wheezing[ n;  hemoptysis[n  ]; shortness of breath[ y ]; paroxysmal nocturnal dyspnea[ y ]; dyspnea on exertion[ y ]; or orthopnea[ n ];  GI:  gallstones[  ], vomiting[  ];  dysphagia[  ]; melena[  ];  hematochezia [  ]; heartburn[  ];   Hx of  Colonoscopy[  ]; GU: kidney stones [  ]; hematuria[  ];   dysuria [  ];  nocturia[  ];  history of     obstruction [  ]; urinary frequency [  ]             Skin: rash, swelling[  ];, hair loss[  ];  peripheral edema[  ];  or itching[  ]; Musculosketetal: myalgias[  ];  joint swelling[  ];  joint erythema[  ];  joint pain[  ];  back pain[  ];  Heme/Lymph: bruising[  ];  bleeding[  ];  anemia[  ];  Neuro: TIA[ n ];  headaches[ n ];  stroke[  ]n;  vertigo[  ];  seizures[  ];   paresthesias[  ];  difficulty walking[fell last week on eliquis   ];  Psych:depression[  ]; anxiety[  ];  Endocrine: diabetes[ n ];  thyroid dysfunction[?  ];  Immunizations: Flu [  ]; Pneumococcal[  ];  Other:  Physical Exam: BP 149/47 mmHg  Pulse 64  Temp(Src) 97.8 F (36.6 C) (Oral)  Resp 18  Ht 5\' 6"  (1.676 m)  Wt 228 lb 4.8 oz (103.556 kg)  BMI 36.87 kg/m2  SpO2 99%   General appearance: alert, cooperative, appears older than  stated age and no distress Head: Normocephalic, without obvious abnormality, atraumatic Neck: no adenopathy, no carotid bruit, no JVD, supple, symmetrical, trachea midline and thyroid not enlarged, symmetric, no tenderness/mass/nodules Lymph nodes: Cervical, supraclavicular, and axillary nodes normal. Resp: clear to auscultation bilaterally Back: symmetric, no curvature. ROM normal. No CVA tenderness. Cardio: regular rate and rhythm, S1, S2 normal, no murmur, click, rub or gallop and no murmur of AS GI: soft, non-tender; bowel sounds normal; no masses,  no organomegaly Extremities: edema bilaterial 2 + with venous varcosities  and brusing of both  knees and right wrist Neurologic: Grossly normal  Diagnostic Studies & Laboratory data:     Recent Radiology Findings:   No results found.   I have independently reviewed the above radiologic studies.  Recent Lab Findings: Lab Results  Component Value Date   WBC 6.5 09/22/2015   HGB 10.4* 09/22/2015   HCT 32.4* 09/22/2015   PLT 191 09/22/2015   GLUCOSE 94 09/20/2015   CHOL 220* 07/29/2015   TRIG 105 07/29/2015   HDL 44* 07/29/2015   LDLDIRECT 144.0 11/21/2010   LDLCALC 155* 07/29/2015   ALT 66* 09/20/2015   AST 65* 09/20/2015   NA 138 09/20/2015   K 3.9 09/20/2015   CL 105 09/20/2015   CREATININE 1.11* 09/20/2015   BUN 23* 09/20/2015   CO2 22 09/20/2015   TSH 4.769* 09/19/2015   INR 1.11 09/20/2015   Chronic Kidney Disease   Stage I     GFR >90  Stage II    GFR 60-89  Stage IIIA GFR 45-59  Stage IIIB GFR 30-44  Stage IV   GFR 15-29  Stage V    GFR  <15  Lab Results  Component Value Date   CREATININE 1.11* 09/20/2015   Estimated Creatinine Clearance: 54.9 mL/min (by C-G formula based on Cr of 1.11).  echocardiogram: LV EF: 55% - 60%  ------------------------------------------------------------------- Indications: Atrial fibrillation -  427.31.  ------------------------------------------------------------------- History: PMH: Coronary artery disease. Risk factors: Hypertension. Dyslipidemia.  ------------------------------------------------------------------- Study Conclusions  - Left ventricle: The cavity size was normal. There was mild  concentric hypertrophy. Systolic function was normal. The  estimated ejection fraction was in the range of 55% to 60%. Wall  motion was normal; there were no regional wall motion  abnormalities. Doppler parameters are consistent with elevated  ventricular end-diastolic filling pressure. - Aortic valve: There was very mild stenosis. Valve area (VTI):  1.69 cm^2. Valve area (Vmax): 1.51 cm^2. Valve area (Vmean): 1.57  cm^2. - Left atrium: The atrium was mildly dilated. - Atrial septum: No defect or patent foramen ovale was identified.  Transthoracic echocardiography. M-mode, complete 2D, spectral Doppler, and color Doppler. Birthdate: Patient birthdate: 1941/01/30. Age: Patient is 75 yr old. Sex: Gender: female. BMI: 36.9 kg/m^2. Blood pressure: 140/70 Patient status: Inpatient. Study date: Study date: 09/21/2015. Study time: 11:26 AM. Location: Bedside.  -------------------------------------------------------------------  ------------------------------------------------------------------- Left ventricle: The cavity size was normal. There was mild concentric hypertrophy. Systolic function was normal. The estimated ejection fraction was in the range of 55% to 60%. Wall motion was normal; there were no regional wall motion abnormalities. Doppler parameters are consistent with elevated ventricular end-diastolic filling pressure.  ------------------------------------------------------------------- Aortic valve: Trileaflet; normal thickness, mildly calcified leaflets. Mobility was not restricted. Doppler: There was very mild stenosis.  There was no regurgitation. VTI ratio of LVOT to aortic valve: 0.6. Valve area (VTI): 1.69 cm^2. Indexed valve area (VTI): 0.75 cm^2/m^2. Peak velocity ratio of LVOT to aortic valve: 0.53. Valve area (Vmax): 1.51 cm^2. Indexed valve area (Vmax): 0.67 cm^2/m^2. Mean velocity ratio of LVOT to aortic valve: 0.55. Valve area (Vmean): 1.57 cm^2. Indexed valve area (Vmean): 0.7 cm^2/m^2. Mean gradient (S): 12 mm Hg. Peak gradient (S): 25 mm Hg.  ------------------------------------------------------------------- Aorta: The aorta was normal, not dilated, and non-diseased. Aortic root: The aortic root was normal in size.  ------------------------------------------------------------------- Mitral valve: Mildly thickened leaflets . Mobility was not restricted. Doppler: Transvalvular velocity was within the normal range. There was no evidence for stenosis. There was no significant regurgitation. Peak gradient (D): 7 mm  Hg.  ------------------------------------------------------------------- Left atrium: The atrium was mildly dilated.  ------------------------------------------------------------------- Atrial septum: No defect or patent foramen ovale was identified.  ------------------------------------------------------------------- Right ventricle: The cavity size was normal. Wall thickness was normal. Systolic function was normal.  ------------------------------------------------------------------- Pulmonic valve: Doppler: Transvalvular velocity was within the normal range. There was no evidence for stenosis.  ------------------------------------------------------------------- Tricuspid valve: Structurally normal valve. Doppler: Transvalvular velocity was within the normal range. There was trivial regurgitation.  ------------------------------------------------------------------- Pulmonary artery: The main pulmonary artery was normal-sized. Systolic  pressure was within the normal range.  ------------------------------------------------------------------- Right atrium: The atrium was normal in size.  ------------------------------------------------------------------- Pericardium: The pericardium was normal in appearance. There was no pericardial effusion.  ------------------------------------------------------------------- Systemic veins: Inferior vena cava: The vessel was normal in size. The respirophasic diameter changes were in the normal range (>= 50%), consistent with normal central venous pressure.  ------------------------------------------------------------------- Post procedure conclusions Ascending Aorta:  - The aorta was normal, not dilated, and non-diseased.  ------------------------------------------------------------------- Measurements  Left ventricle Value Reference LV ID, ED, PLAX chordal (L) 39.9 mm 43 - 52 LV ID, ES, PLAX chordal 27.7 mm 23 - 38 LV fx shortening, PLAX chordal 31 % >=29 LV PW thickness, ED 13.2 mm --------- IVS/LV PW ratio, ED 0.97 <=1.3 Stroke volume, 2D 89 ml --------- Stroke volume/bsa, 2D 40 ml/m^2 --------- LV ejection fraction, 1-p A4C 62 % --------- LV end-diastolic volume, 2-p 65 ml --------- LV end-systolic volume, 2-p 26 ml --------- LV ejection fraction, 2-p 60 % --------- Stroke volume, 2-p 39 ml --------- LV end-diastolic volume/bsa, 2-p 29 ml/m^2 --------- LV end-systolic volume/bsa, 2-p 11 ml/m^2 --------- Stroke volume/bsa, 2-p 17.4 ml/m^2 --------- LV e&',  lateral 7.94 cm/s --------- LV E/e&', lateral 16.25 --------- LV s&', lateral 6.4 cm/s --------- LV e&', medial 7.72 cm/s --------- LV E/e&', medial 16.71 --------- LV e&', average 7.83 cm/s --------- LV E/e&', average 16.48 ---------  Ventricular septum Value Reference IVS thickness, ED 12.8 mm ---------  LVOT Value Reference LVOT ID, S 19 mm --------- LVOT area 2.84 cm^2 --------- LVOT peak velocity, S 134 cm/s --------- LVOT mean velocity, S 86.4 cm/s --------- LVOT VTI, S 31.4 cm --------- LVOT peak gradient, S 7 mm Hg ---------  Aortic valve Value Reference Aortic valve peak velocity, S 252 cm/s --------- Aortic valve mean velocity, S 156 cm/s --------- Aortic valve VTI, S 52.7 cm --------- Aortic mean gradient, S 12 mm Hg --------- Aortic peak gradient, S 25 mm Hg --------- VTI ratio, LVOT/AV 0.6 --------- Aortic valve area, VTI 1.69 cm^2 --------- Aortic valve area/bsa, VTI 0.75 cm^2/m^2 --------- Velocity ratio, peak, LVOT/AV 0.53 --------- Aortic valve area, peak velocity 1.51 cm^2 --------- Aortic valve area/bsa, peak 0.67 cm^2/m^2  --------- velocity Velocity ratio, mean, LVOT/AV 0.55 --------- Aortic valve area, mean velocity 1.57 cm^2 --------- Aortic valve area/bsa, mean 0.7 cm^2/m^2 --------- velocity  Aorta Value Reference Aortic root ID, ED 31 mm ---------  Left atrium Value Reference LA ID, A-P, ES 39 mm --------- LA ID/bsa, A-P 1.74 cm/m^2 <=2.2 LA volume, S 74 ml --------- LA volume/bsa, S 33 ml/m^2 --------- LA volume, ES, 1-p A4C 68.8 ml --------- LA volume/bsa, ES, 1-p A4C 30.7 ml/m^2 --------- LA volume, ES, 1-p A2C 75.6 ml --------- LA volume/bsa, ES, 1-p A2C 33.7 ml/m^2 ---------  Mitral valve Value Reference Mitral E-wave peak velocity 129 cm/s --------- Mitral A-wave peak velocity 94.6 cm/s --------- Mitral deceleration time (H) 268 ms 150 - 230 Mitral peak gradient, D 7 mm Hg --------- Mitral E/A ratio, peak 1.4 ---------  Pulmonary arteries Value Reference PA pressure, S, DP 23 mm Hg <=30  Tricuspid valve Value Reference Tricuspid regurg peak velocity 225 cm/s --------- Tricuspid peak RV-RA gradient 20 mm Hg ---------  Systemic veins Value Reference Estimated CVP 3 mm Hg ---------  Right  ventricle Value Reference TAPSE 24.9 mm --------- RV pressure, S, DP 23 mm Hg <=30 RV s&', lateral, S 11.4 cm/s ---------  Legend: (L) and (H) mark values outside specified reference range.  ------------------------------------------------------------------- Prepared and Electronically Authenticated by  Jenkins Rouge, M.D. 2017-05-03T12:32:53  Cardiac Cath: Conclusion     Ost LAD to Prox LAD lesion, 50% stenosed. Mid LAD lesion, 60% stenosed. the FFR across this disease was 0.77.  Mid RCA lesion, 50% stenosed. The FFR across this lesion was 0.79.  Mild aortic valve gradient.  Mildly elevated LVEDP  LAD disease is not ideal for PCI given the ostial nature of this lesion. There would have to be some stent into the short left main, which could compromise the circumflex. The RCA lesion is amenable to PCI. Will obtain surgical consultation per Dr. Tamala Julian to evaluate this option versus medical therapy, as her symptoms are controlled when her rhythm is controlled.    On my review the ostium of LAD looks much more severe then 50%   I have independently reviewed the above  cath films and reviewed the findings with the  patient .   Assessment / Plan:    Coronary artery disease , especially involving ostial LAD ( more then 80% ) , less significant disease of right - I have recommended to her that we at least proceed with LIMA to LAD , atrial clip with intermittent afib and  pulmonary vein isolation . Stent of RCA could be done later as needed  The goals risks and alternatives of the planned surgical procedure CABG , atrial clip with intermittent afib and  MAZE pulmonary vein isolation have been discussed with the patient in detail. The risks of the procedure including death, infection, stroke, myocardial infarction, bleeding, blood transfusion have  all been discussed specifically.  I have quoted Dimas Millin Schaff a 3% of perioperative mortality and a complication rate as high as 30%. The patient's questions have been answered.[name] is willing  to proceed with the planned procedure.  In addition to other potential risks and complications from the surgery, I have made the patient aware of the recent [place] concerning the risk of infection by Myocobacterium chimaera related to the use of Stockert 3T heater-cooler equipment during cardiac surgery. I discussed with the patient the low risk of infection, as well as our compliance with the most current FDA recommendations to minimize infection and testing of all devices for contamination. The patient has been made aware of the limited alternatives to immediately replacing the current equipment. The patient has been informed regarding the risks associated with waiting to proceed with needed surgery and that such risks are greater than the risk of infection related to the use of the heater-cooler device. I did make the patient aware that after careful review of the patients having cardiac surgery at [facility] we have no evidence that heater/cooler related infections have occurred at [facility]. We discussed that this is a slow-growing bacterium, such that it can take some period of time for symptoms to develop.    Grace Isaac MD      [address] [place] [address] Office [phone]   Beeper [phone]  09/22/2015 6:07 PM

## 2015-09-22 NOTE — Progress Notes (Addendum)
Patient Name: [name] Date of Encounter: 09/22/2015    SUBJECTIVE: Denies any complaints, does not have any pain from cath site on right forearm.   TELEMETRY:  NSR Filed Vitals:   09/22/15 0411 09/22/15 0644 09/22/15 0652 09/22/15 0758  BP: 151/41 145/52 139/56   Pulse: 64 65 77   Temp: 98 F (36.7 C)     TempSrc: Oral     Resp: 18     Height:      Weight:    228 lb 4.8 oz (103.556 kg)  SpO2: 95% 95% 94%     Intake/Output Summary (Last 24 hours) at 09/22/15 0945 Last data filed at 09/22/15 0650  Gross per 24 hour  Intake 1053.67 ml  Output      0 ml  Net 1053.67 ml   LABS: Basic Metabolic Panel:  Recent Labs  09/20/15 0005  NA 138  K 3.9  CL 105  CO2 22  GLUCOSE 94  BUN 23*  CREATININE 1.11*  CALCIUM 9.5   CBC:  Recent Labs  09/21/15 0355 09/22/15 0222  WBC 6.1 6.5  HGB 10.6* 10.4*  HCT 32.6* 32.4*  MCV 91.1 92.3  PLT 194 191   Cardiac Enzymes:  Recent Labs  09/19/15 1155 09/19/15 1804 09/20/15 0005  TROPONINI 0.06* 0.06* 0.06*    Physical Exam: Blood pressure 139/56, pulse 77, temperature 98 F (36.7 C), temperature source Oral, resp. rate 18, height 5\' 6"  (1.676 m), weight 228 lb 4.8 oz (103.556 kg), SpO2 94 %. Weight change:   Wt Readings from Last 3 Encounters:  09/22/15 228 lb 4.8 oz (103.556 kg)  09/11/15 225 lb (102.059 kg)  07/29/15 230 lb 3.2 oz (104.418 kg)    Physical Exam  Constitutional: She appears well-developed and well-nourished. No distress.  HENT:  Head: Normocephalic and atraumatic.  Nose: Nose normal.  Eyes: EOM are normal. No scleral icterus.  Cardiovascular: Normal rate, regular rhythm and normal heart sounds.  Exam reveals no gallop and no friction rub.   No murmur heard. Pulmonary/Chest: Effort normal and breath sounds normal. No respiratory distress. She has no wheezes. She has no rales. She exhibits no tenderness.  Abdominal: Soft. Bowel sounds are normal. She exhibits no distension and no  mass. There is no tenderness. There is no rebound and no guarding.  Skin: Skin is warm and dry. No rash noted. She is not diaphoretic. No erythema. No pallor.    ASSESSMENT: 75 year old female presenting with a funny sensation that she gets when she has atrial fibrillation that was associated w/ SOB and leg weakness. Right heart cath + for LAD and RCA disease, now proceeding w/ sx intervention.   PLAN: 1. Atrial fibrillation- NSR w/ rate contolled.  2. Coronary artery disease -stent to RCA in 2009. cath 5/2  revealed Ost LAD to Prox LAD lesion, 50% stenosed. Mid LAD lesion, 60% stenosed, and mid RCA 50% stenosis. Her LAD disease is not ideal for PCI given the ostial nature of this lesion. There would have to be some stent into the short left main, which could compromise the circumflex. The RCA lesion is amenable to PCI.  - Cardiothoracic sx following, possible LIMA to LAD sx Friday after she has been off of eliquis for 5 days.  3. HTN - continue home HCTZ 25mg  daily and lisinopril 40mg  daily 4. Obstructive sleep apnea - unable to tolerate CPAP, could be culprit of patients afib  5. GERD- reports having bad  acid reflux and sleeping with head elevated due to this - continue home protonix 40mg  BID 6. HLD- LDL 155 on 07/29/2015, it seems like she was on crestor 10mg . She is on diltiazem which has a drug interaction w/ atorvastatin.  - crestor 20mg  qd.  7.Hx of multinodular goiter s/p radioactive iodine tx in 2015 - checking TSH -- came back elevated at 4.769 w/ nl FT4 consistent w/ subclinical hypothyroidism. F/u with Dr. Buddy Duty as outpatient    Signed, Julious Oka 09/22/2015, 9:45 AM  The patient has been seen in conjunction with Julious Oka, M.D. All aspects of care have been considered and discussed. The patient has been personally interviewed, examined, and all clinical data has been reviewed.   Plan is for coronary bypass grafting in a.m. by Dr. Servando Snare. Indication is significant  ischemia on EKG during atrial fibrillation with tachycardia and hemodynamically significant ostial/proximal LAD as well as mid RCA lesions at catheterization by FFR.  Will definitely receive a LIMA to LAD and atrial clip (decrease future risk of embolization from atrial fibrillation). Final decision about RCA and further atrial ablative therapy per Dr. Servando Snare.  If RCA is not grafted, we will follow clinically and when evidence of ischemia, perform PCI with stenting at some later date.

## 2015-09-22 NOTE — Progress Notes (Signed)
Physical Therapy Treatment Patient Details Name: [name] MRN: [id number] DOB: 1940/09/03 Today's Date: 10-19-2015    History of Present Illness Patient is a 75 y.o. female presenting with chest pain and atrial fibrillation    PT Comments    Patient seen for mobility progression, mobilizing well. Improved stability this session but continues to require standing rest breaks. VSS throughout session. Tolerated increased ambulation this session.  Follow Up Recommendations  Supervision - Intermittent     Equipment Recommendations  Other (comment) (rollator)    Recommendations for Other Services       Precautions / Restrictions Precautions Precautions: Fall Restrictions Weight Bearing Restrictions: No    Mobility  Bed Mobility Overal bed mobility: Independent                Transfers Overall transfer level: Independent Equipment used: None                Ambulation/Gait Ambulation/Gait assistance: Supervision Ambulation Distance (Feet): 240 Feet Assistive device: None;1 person hand held assist Gait Pattern/deviations: Step-through pattern;Decreased stride length;Drifts right/left Gait velocity: decreased   General Gait Details: Patient still with staggering gait and multiple balance checks at times, no physical assist required, patient able to self correct. Some DOE noted    Financial trader Rankin (Stroke Patients Only)       Balance                             High level balance activites: Direction changes;Turns;Head turns High Level Balance Comments: continues to have some modest instability with higher level tasks but no physical assist required    Cognition Arousal/Alertness: Awake/alert Behavior During Therapy: WFL for tasks assessed/performed Overall Cognitive Status: Within Functional Limits for tasks assessed                      Exercises      General Comments        Pertinent Vitals/Pain Pain Assessment: No/denies pain    Home Living                      Prior Function            PT Goals (current goals can now be found in the care plan section) Acute Rehab PT Goals Patient Stated Goal: to go home PT Goal Formulation: With patient Time For Goal Achievement: 10/03/15 Potential to Achieve Goals: Good Progress towards PT goals: Progressing toward goals    Frequency  Min 3X/week    PT Plan Current plan remains appropriate    Co-evaluation             End of Session Equipment Utilized During Treatment: Gait belt Activity Tolerance: Patient limited by fatigue Patient left: in bed;with call bell/phone within reach (sitting EOB)     Time: [id number] PT Time Calculation (min) (ACUTE ONLY): 15 min  Charges:  $Gait Training: 8-22 mins                    G CodesDuncan Dull 10-19-2015, 2:58 PM Alben Deeds, [place] DPT  [phone]

## 2015-09-22 NOTE — Progress Notes (Signed)
Pt has Chest tightness described as a pressure 3/10 to left side of the chest, pt received  Nitroglycerine SL x2 with some relieve . VS are stable( see flow sheet). EKG performed, no change noted.  Attending team notified.  Ferdinand Lango, RN

## 2015-09-22 NOTE — Progress Notes (Signed)
Pre-op Cardiac Surgery  Carotid Findings:  1-39% ICA plaquing.  Vertebral artery flow is antegrade.   Upper Extremity Right Left  Brachial Pressures 138T 123T  Radial Waveforms T T  Ulnar Waveforms T T  Palmar Arch (Allen's Test) WNL WNL   Findings:      Lower  Extremity Right Left  Dorsalis Pedis    Anterior Tibial T T  Posterior Tibial T T  Ankle/Brachial Indices      Findings:  palbable

## 2015-09-22 NOTE — Progress Notes (Signed)
Pt had 10 bt run of vtach.  Asymptomatic and VSS, MD notified.

## 2015-09-22 NOTE — Progress Notes (Signed)
ANTICOAGULATION CONSULT NOTE - Follow Up Consult  Pharmacy Consult:  Heparin Indication: atrial fibrillation  Allergies  Allergen Reactions  . Cortisone Nausea Only and Other (See Comments)    Pt gets very hot and flushed  . Sulfamethoxazole-Trimethoprim Other (See Comments)    Reaction: unknown    Patient Measurements: Height: 5\' 6"  (167.6 cm) Weight: 228 lb 11.2 oz (103.738 kg) IBW/kg (Calculated) : 59.3 Heparin Dosing Weight: 83 kg  Vital Signs: Temp: 98 F (36.7 C) (05/04 0411) Temp Source: Oral (05/04 0411) BP: 139/56 mmHg (05/04 0652) Pulse Rate: 77 (05/04 0652)  Labs:  Recent Labs  09/19/15 1155  09/19/15 1804 09/19/15 2102  09/20/15 0005 09/20/15 1037 09/21/15 0355 09/21/15 0358 09/22/15 0222  HGB  --   --   --   --   < > 10.5*  --  10.6*  --  10.4*  HCT  --   --   --   --   --  33.3*  --  32.6*  --  32.4*  PLT  --   --   --   --   --  214  --  194  --  191  APTT  --   < >  --  60*  --  74*  --  73*  --   --   LABPROT  --   --   --   --   --   --  14.5  --   --   --   INR  --   --   --   --   --   --  1.11  --   --   --   HEPARINUNFRC  --   < >  --   --   --  0.76*  --   --  0.36 0.36  CREATININE  --   --   --   --   --  1.11*  --   --   --   --   TROPONINI 0.06*  --  0.06*  --   --  0.06*  --   --   --   --   < > = values in this interval not displayed.  Estimated Creatinine Clearance: 54.9 mL/min (by C-G formula based on Cr of 1.11).   Assessment: [name] with history of Afib on Eliquis PTA.  Patient presented to the ED with chest pain and anticoagulation was switched to IV heparin. Levels are correlating now and last HL was therapeutic at 0.36. CVTS recommending LIMA to LAD to help with ostial LAD blockage. Possible surgery Friday after off Eliquis for ~5 days. Hgb stable at 10.4, plts wnl. No s/s of bleed.  Goal of Therapy:  Heparin level 0.3-0.7 units/ml  Monitor platelets by anticoagulation protocol: Yes  Plan:  Continue heparin gtt at 1,400  units/hr Monitor daily HL, CBC, s/s of bleed F/U restart of Eliquis after surgery  [name], PharmD, BCPS Clinical Pharmacist Pager [phone] 09/22/2015 7:30 AM

## 2015-09-22 NOTE — Progress Notes (Signed)
CARDIAC REHAB PHASE I   Pt just finished ambulating with PT, no complaints. Cardiac surgery pre-op education completed with pt. Reviewed IS, sternal precautions, activity progression, cardiac surgery booklet and cardiac surgery guidelines. Left instructions for pt to view cardiac surgery videos. Pt verbalized understanding, receptive to education. Pt sitting up on edge of bed, eating lunch, call bell within reach. Will follow post-op.   [place], RN, BSN 09/22/2015 12:18 PM

## 2015-09-23 ENCOUNTER — Encounter (HOSPITAL_COMMUNITY): Payer: Self-pay | Admitting: Certified Registered"

## 2015-09-23 ENCOUNTER — Inpatient Hospital Stay (HOSPITAL_COMMUNITY): Payer: Medicare Other

## 2015-09-23 ENCOUNTER — Inpatient Hospital Stay (HOSPITAL_COMMUNITY): Payer: Medicare Other | Admitting: Certified Registered Nurse Anesthetist

## 2015-09-23 ENCOUNTER — Encounter (HOSPITAL_COMMUNITY)
Admission: EM | Disposition: A | Discharge status: Skilled Nursing Facility | Payer: Self-pay | Source: Home / Self Care | Attending: Cardiothoracic Surgery

## 2015-09-23 DIAGNOSIS — I2510 Atherosclerotic heart disease of native coronary artery without angina pectoris: Secondary | ICD-10-CM | POA: Diagnosis present

## 2015-09-23 HISTORY — PX: CORONARY ARTERY BYPASS GRAFT: SHX141

## 2015-09-23 HISTORY — PX: CLIPPING OF ATRIAL APPENDAGE: SHX5773

## 2015-09-23 HISTORY — PX: MAZE: SHX5063

## 2015-09-23 HISTORY — PX: TEE WITHOUT CARDIOVERSION: SHX5443

## 2015-09-23 LAB — CBC
HCT: 31.8 % — ABNORMAL LOW (ref 36.0–46.0)
HCT: 27.5 % — ABNORMAL LOW (ref 36.0–46.0)
HEMATOCRIT: 26.8 % — AB (ref 36.0–46.0)
HEMOGLOBIN: 8.5 g/dL — AB (ref 12.0–15.0)
Hemoglobin: 9.8 g/dL — ABNORMAL LOW (ref 12.0–15.0)
Hemoglobin: 8.7 g/dL — ABNORMAL LOW (ref 12.0–15.0)
MCH: 28 pg (ref 26.0–34.0)
MCH: 28.6 pg (ref 26.0–34.0)
MCH: 28.2 pg (ref 26.0–34.0)
MCHC: 30.8 g/dL (ref 30.0–36.0)
MCHC: 31.7 g/dL (ref 30.0–36.0)
MCHC: 31.6 g/dL (ref 30.0–36.0)
MCV: 90.9 fL (ref 78.0–100.0)
MCV: 90.2 fL (ref 78.0–100.0)
MCV: 89.3 fL (ref 78.0–100.0)
PLATELETS: 199 10*3/uL (ref 150–400)
Platelets: 142 10*3/uL — ABNORMAL LOW (ref 150–400)
Platelets: 164 10*3/uL (ref 150–400)
RBC: 3.5 MIL/uL — AB (ref 3.87–5.11)
RBC: 2.97 MIL/uL — AB (ref 3.87–5.11)
RBC: 3.08 MIL/uL — ABNORMAL LOW (ref 3.87–5.11)
RDW: 15.3 % (ref 11.5–15.5)
RDW: 15 % (ref 11.5–15.5)
RDW: 14.9 % (ref 11.5–15.5)
WBC: 6.8 10*3/uL (ref 4.0–10.5)
WBC: 10.9 10*3/uL — ABNORMAL HIGH (ref 4.0–10.5)
WBC: 12.2 10*3/uL — ABNORMAL HIGH (ref 4.0–10.5)

## 2015-09-23 LAB — POCT I-STAT, CHEM 8
BUN: 12 mg/dL (ref 6–20)
BUN: 11 mg/dL (ref 6–20)
BUN: 10 mg/dL (ref 6–20)
BUN: 11 mg/dL (ref 6–20)
BUN: 11 mg/dL (ref 6–20)
CALCIUM ION: 1.27 mmol/L (ref 1.13–1.30)
CHLORIDE: 101 mmol/L (ref 101–111)
CHLORIDE: 103 mmol/L (ref 101–111)
CHLORIDE: 103 mmol/L (ref 101–111)
CREATININE: 0.7 mg/dL (ref 0.44–1.00)
CREATININE: 0.6 mg/dL (ref 0.44–1.00)
Calcium, Ion: 1.26 mmol/L (ref 1.13–1.30)
Calcium, Ion: 1.12 mmol/L — ABNORMAL LOW (ref 1.13–1.30)
Calcium, Ion: 1.17 mmol/L (ref 1.13–1.30)
Calcium, Ion: 1.25 mmol/L (ref 1.13–1.30)
Chloride: 104 mmol/L (ref 101–111)
Chloride: 104 mmol/L (ref 101–111)
Creatinine, Ser: 0.7 mg/dL (ref 0.44–1.00)
Creatinine, Ser: 0.7 mg/dL (ref 0.44–1.00)
Creatinine, Ser: 0.7 mg/dL (ref 0.44–1.00)
GLUCOSE: 95 mg/dL (ref 65–99)
GLUCOSE: 109 mg/dL — AB (ref 65–99)
GLUCOSE: 159 mg/dL — AB (ref 65–99)
GLUCOSE: 146 mg/dL — AB (ref 65–99)
Glucose, Bld: 141 mg/dL — ABNORMAL HIGH (ref 65–99)
HCT: 31 % — ABNORMAL LOW (ref 36.0–46.0)
HCT: 24 % — ABNORMAL LOW (ref 36.0–46.0)
HCT: 31 % — ABNORMAL LOW (ref 36.0–46.0)
HEMATOCRIT: 27 % — AB (ref 36.0–46.0)
HEMATOCRIT: 27 % — AB (ref 36.0–46.0)
HEMOGLOBIN: 10.5 g/dL — AB (ref 12.0–15.0)
HEMOGLOBIN: 9.2 g/dL — AB (ref 12.0–15.0)
HEMOGLOBIN: 9.2 g/dL — AB (ref 12.0–15.0)
Hemoglobin: 8.2 g/dL — ABNORMAL LOW (ref 12.0–15.0)
Hemoglobin: 10.5 g/dL — ABNORMAL LOW (ref 12.0–15.0)
POTASSIUM: 4.1 mmol/L (ref 3.5–5.1)
POTASSIUM: 4.4 mmol/L (ref 3.5–5.1)
POTASSIUM: 4.3 mmol/L (ref 3.5–5.1)
Potassium: 4 mmol/L (ref 3.5–5.1)
Potassium: 4 mmol/L (ref 3.5–5.1)
SODIUM: 139 mmol/L (ref 135–145)
SODIUM: 139 mmol/L (ref 135–145)
SODIUM: 137 mmol/L (ref 135–145)
Sodium: 141 mmol/L (ref 135–145)
Sodium: 138 mmol/L (ref 135–145)
TCO2: 29 mmol/L (ref 0–100)
TCO2: 25 mmol/L (ref 0–100)
TCO2: 24 mmol/L (ref 0–100)
TCO2: 28 mmol/L (ref 0–100)
TCO2: 21 mmol/L (ref 0–100)

## 2015-09-23 LAB — HEPARIN LEVEL (UNFRACTIONATED): Heparin Unfractionated: 0.26 IU/mL — ABNORMAL LOW (ref 0.30–0.70)

## 2015-09-23 LAB — POCT I-STAT 4, (NA,K, GLUC, HGB,HCT)
GLUCOSE: 126 mg/dL — AB (ref 65–99)
HCT: 31 % — ABNORMAL LOW (ref 36.0–46.0)
HEMOGLOBIN: 10.5 g/dL — AB (ref 12.0–15.0)
POTASSIUM: 3.6 mmol/L (ref 3.5–5.1)
SODIUM: 138 mmol/L (ref 135–145)

## 2015-09-23 LAB — COMPREHENSIVE METABOLIC PANEL
ALT: 81 U/L — ABNORMAL HIGH (ref 14–54)
AST: 89 U/L — ABNORMAL HIGH (ref 15–41)
Albumin: 2.8 g/dL — ABNORMAL LOW (ref 3.5–5.0)
Alkaline Phosphatase: 77 U/L (ref 38–126)
Anion gap: 13 (ref 5–15)
BUN: 12 mg/dL (ref 6–20)
CO2: 24 mmol/L (ref 22–32)
Calcium: 9.4 mg/dL (ref 8.9–10.3)
Chloride: 103 mmol/L (ref 101–111)
Creatinine, Ser: 1.07 mg/dL — ABNORMAL HIGH (ref 0.44–1.00)
GFR calc Af Amer: 58 mL/min — ABNORMAL LOW (ref >60)
GFR calc non Af Amer: 50 mL/min — ABNORMAL LOW (ref >60)
Glucose, Bld: 100 mg/dL — ABNORMAL HIGH (ref 65–99)
Potassium: 3.9 mmol/L (ref 3.5–5.1)
Sodium: 140 mmol/L (ref 135–145)
Total Bilirubin: 0.6 mg/dL (ref 0.3–1.2)
Total Protein: 7.6 g/dL (ref 6.5–8.1)

## 2015-09-23 LAB — POCT I-STAT 3, ART BLOOD GAS (G3+)
ACID-BASE DEFICIT: 2 mmol/L (ref 0.0–2.0)
Acid-Base Excess: 4 mmol/L — ABNORMAL HIGH (ref 0.0–2.0)
Acid-base deficit: 3 mmol/L — ABNORMAL HIGH (ref 0.0–2.0)
Acid-base deficit: 2 mmol/L (ref 0.0–2.0)
Acid-base deficit: 3 mmol/L — ABNORMAL HIGH (ref 0.0–2.0)
BICARBONATE: 27.3 meq/L — AB (ref 20.0–24.0)
BICARBONATE: 21.2 meq/L (ref 20.0–24.0)
BICARBONATE: 22.9 meq/L (ref 20.0–24.0)
Bicarbonate: 25.7 mEq/L — ABNORMAL HIGH (ref 20.0–24.0)
Bicarbonate: 23.2 mEq/L (ref 20.0–24.0)
Bicarbonate: 20.9 mEq/L (ref 20.0–24.0)
O2 Saturation: 100 %
O2 Saturation: 100 %
O2 Saturation: 100 %
O2 Saturation: 93 %
O2 Saturation: 89 %
O2 Saturation: 91 %
PCO2 ART: 36 mmHg (ref 35.0–45.0)
PCO2 ART: 34.6 mmHg — AB (ref 35.0–45.0)
PCO2 ART: 39.9 mmHg (ref 35.0–45.0)
PCO2 ART: 42.9 mmHg (ref 35.0–45.0)
PCO2 ART: 34.5 mmHg — AB (ref 35.0–45.0)
PH ART: 7.359 (ref 7.350–7.450)
PH ART: 7.487 — AB (ref 7.350–7.450)
PH ART: 7.394 (ref 7.350–7.450)
PO2 ART: 67 mmHg — AB (ref 80.0–100.0)
Patient temperature: 36.3
Patient temperature: 37.4
Patient temperature: 37.5
TCO2: 27 mmol/L (ref 0–100)
TCO2: 28 mmol/L (ref 0–100)
TCO2: 22 mmol/L (ref 0–100)
TCO2: 24 mmol/L (ref 0–100)
TCO2: 24 mmol/L (ref 0–100)
TCO2: 22 mmol/L (ref 0–100)
pCO2 arterial: 45.6 mmHg — ABNORMAL HIGH (ref 35.0–45.0)
pH, Arterial: 7.364 (ref 7.350–7.450)
pH, Arterial: 7.344 — ABNORMAL LOW (ref 7.350–7.450)
pH, Arterial: 7.394 (ref 7.350–7.450)
pO2, Arterial: 385 mmHg — ABNORMAL HIGH (ref 80.0–100.0)
pO2, Arterial: 455 mmHg — ABNORMAL HIGH (ref 80.0–100.0)
pO2, Arterial: 260 mmHg — ABNORMAL HIGH (ref 80.0–100.0)
pO2, Arterial: 61 mmHg — ABNORMAL LOW (ref 80.0–100.0)
pO2, Arterial: 63 mmHg — ABNORMAL LOW (ref 80.0–100.0)

## 2015-09-23 LAB — CREATININE, SERUM
Creatinine, Ser: 0.85 mg/dL (ref 0.44–1.00)
GFR calc Af Amer: >60 mL/min (ref >60)
GFR calc non Af Amer: >60 mL/min (ref >60)

## 2015-09-23 LAB — HEMOGLOBIN AND HEMATOCRIT, BLOOD
HCT: 22.3 % — ABNORMAL LOW (ref 36.0–46.0)
Hemoglobin: 7.1 g/dL — ABNORMAL LOW (ref 12.0–15.0)

## 2015-09-23 LAB — APTT: aPTT: 79 seconds — ABNORMAL HIGH (ref 24–37)

## 2015-09-23 LAB — PROTIME-INR
INR: 1.32 (ref 0.00–1.49)
PROTHROMBIN TIME: 16.5 s — AB (ref 11.6–15.2)

## 2015-09-23 LAB — PLATELET COUNT: Platelets: 122 10*3/uL — ABNORMAL LOW (ref 150–400)

## 2015-09-23 LAB — MAGNESIUM: Magnesium: 2.5 mg/dL — ABNORMAL HIGH (ref 1.7–2.4)

## 2015-09-23 SURGERY — CORONARY ARTERY BYPASS GRAFTING (CABG)
Anesthesia: General | Site: Chest

## 2015-09-23 MED ORDER — ARTIFICIAL TEARS OP OINT
TOPICAL_OINTMENT | OPHTHALMIC | Status: AC
  Filled 2015-09-23: qty 3.5

## 2015-09-23 MED ORDER — METOPROLOL TARTRATE 5 MG/5ML IV SOLN
2.5000 mg | INTRAVENOUS | Status: DC | PRN
  Administered 2015-09-25: 5 mg via INTRAVENOUS
  Filled 2015-09-23: qty 5

## 2015-09-23 MED ORDER — LACTATED RINGERS IV SOLN: 500.0000 mL | Freq: Once | INTRAVENOUS | Status: DC | PRN

## 2015-09-23 MED ORDER — DEXMEDETOMIDINE HCL IN NACL 200 MCG/50ML IV SOLN
0.0000 ug/kg/h | INTRAVENOUS | Status: DC
  Filled 2015-09-23: qty 50

## 2015-09-23 MED ORDER — POTASSIUM CHLORIDE 10 MEQ/50ML IV SOLN
10.0000 meq | INTRAVENOUS | Status: AC
  Administered 2015-09-23 (×3): 10 meq via INTRAVENOUS

## 2015-09-23 MED ORDER — ARTIFICIAL TEARS OP OINT
TOPICAL_OINTMENT | OPHTHALMIC | Status: DC | PRN
  Administered 2015-09-23: 1 via OPHTHALMIC

## 2015-09-23 MED ORDER — SUFENTANIL CITRATE 250 MCG/5ML IV SOLN
INTRAVENOUS | Status: AC
  Filled 2015-09-23: qty 5

## 2015-09-23 MED ORDER — INSULIN REGULAR BOLUS VIA INFUSION
0.0000 [IU] | Freq: Three times a day (TID) | INTRAVENOUS | Status: DC
  Filled 2015-09-23: qty 10

## 2015-09-23 MED ORDER — TRAMADOL HCL 50 MG PO TABS
50.0000 mg | ORAL_TABLET | ORAL | Status: DC | PRN
  Administered 2015-09-24 – 2015-09-26 (×3): 100 mg via ORAL
  Filled 2015-09-23 (×3): qty 2

## 2015-09-23 MED ORDER — CHLORHEXIDINE GLUCONATE 0.12% ORAL RINSE (MEDLINE KIT)
15.0000 mL | Freq: Two times a day (BID) | OROMUCOSAL | Status: DC
  Administered 2015-09-23 – 2015-09-25 (×4): 15 mL via OROMUCOSAL

## 2015-09-23 MED ORDER — PROPOFOL 10 MG/ML IV BOLUS
INTRAVENOUS | Status: DC | PRN
  Administered 2015-09-23: 50 mg via INTRAVENOUS
  Administered 2015-09-23: 20 mg via INTRAVENOUS

## 2015-09-23 MED ORDER — LACTATED RINGERS IV SOLN: INTRAVENOUS | Status: DC

## 2015-09-23 MED ORDER — SODIUM CHLORIDE 0.45 % IV SOLN
INTRAVENOUS | Status: DC | PRN
  Administered 2015-09-23: 13:00:00 via INTRAVENOUS

## 2015-09-23 MED ORDER — NITROGLYCERIN IN D5W 200-5 MCG/ML-% IV SOLN: 0.0000 ug/min | INTRAVENOUS | Status: DC

## 2015-09-23 MED ORDER — HEMOSTATIC AGENTS (NO CHARGE) OPTIME
TOPICAL | Status: DC | PRN
  Administered 2015-09-23: 1 via TOPICAL

## 2015-09-23 MED ORDER — FAMOTIDINE IN NACL 20-0.9 MG/50ML-% IV SOLN
20.0000 mg | Freq: Two times a day (BID) | INTRAVENOUS | Status: DC
  Administered 2015-09-23: 20 mg via INTRAVENOUS

## 2015-09-23 MED ORDER — SODIUM CHLORIDE 0.9 % IV SOLN
INTRAVENOUS | Status: DC
  Administered 2015-09-23: 13:00:00 via INTRAVENOUS

## 2015-09-23 MED ORDER — VANCOMYCIN HCL 1000 MG IV SOLR
1500.0000 mg | INTRAVENOUS | Status: DC | PRN
  Administered 2015-09-23: 1500 mg via INTRAVENOUS

## 2015-09-23 MED ORDER — ANTISEPTIC ORAL RINSE SOLUTION (CORINZ)
7.0000 mL | Freq: Four times a day (QID) | OROMUCOSAL | Status: DC
  Administered 2015-09-23 – 2015-09-25 (×7): 7 mL via OROMUCOSAL

## 2015-09-23 MED ORDER — 0.9 % SODIUM CHLORIDE (POUR BTL) OPTIME
TOPICAL | Status: DC | PRN
  Administered 2015-09-23: 6000 mL

## 2015-09-23 MED ORDER — MUPIROCIN 2 % EX OINT
TOPICAL_OINTMENT | Freq: Two times a day (BID) | CUTANEOUS | Status: AC
  Administered 2015-09-23: 1 via NASAL
  Administered 2015-09-24 – 2015-09-26 (×5): via NASAL
  Administered 2015-09-26: 1 via NASAL
  Administered 2015-09-27 – 2015-09-28 (×3): via NASAL
  Filled 2015-09-23 (×4): qty 22

## 2015-09-23 MED ORDER — ACETAMINOPHEN 160 MG/5ML PO SOLN
1000.0000 mg | Freq: Four times a day (QID) | ORAL | Status: DC
Start: 2015-09-24 — End: 2015-09-26

## 2015-09-23 MED ORDER — PHENYLEPHRINE HCL 10 MG/ML IJ SOLN
0.0000 ug/min | INTRAVENOUS | Status: DC
  Filled 2015-09-23: qty 2

## 2015-09-23 MED ORDER — LACTATED RINGERS IV SOLN
INTRAVENOUS | Status: DC | PRN
  Administered 2015-09-23: 07:00:00 via INTRAVENOUS

## 2015-09-23 MED ORDER — PROTAMINE SULFATE 10 MG/ML IV SOLN
INTRAVENOUS | Status: DC | PRN
Start: 2015-09-23 — End: 2015-09-23
  Administered 2015-09-23 (×2): 175 mg via INTRAVENOUS

## 2015-09-23 MED ORDER — LACTATED RINGERS IV SOLN
INTRAVENOUS | Status: DC | PRN
  Administered 2015-09-23 (×2): via INTRAVENOUS

## 2015-09-23 MED ORDER — SODIUM CHLORIDE 0.9 % IV SOLN
10.0000 g | INTRAVENOUS | Status: DC | PRN
  Administered 2015-09-23: 5 g/h via INTRAVENOUS

## 2015-09-23 MED ORDER — BISACODYL 5 MG PO TBEC
10.0000 mg | DELAYED_RELEASE_TABLET | Freq: Every day | ORAL | Status: DC
  Administered 2015-09-24 – 2015-09-25 (×2): 10 mg via ORAL
  Filled 2015-09-23 (×2): qty 2

## 2015-09-23 MED ORDER — PRAMIPEXOLE DIHYDROCHLORIDE 1 MG PO TABS: 1.0000 mg | ORAL_TABLET | Freq: Three times a day (TID) | ORAL | Status: DC

## 2015-09-23 MED ORDER — HEPARIN SODIUM (PORCINE) 1000 UNIT/ML IJ SOLN
INTRAMUSCULAR | Status: DC | PRN
  Administered 2015-09-23: 10000 [IU] via INTRAVENOUS
  Administered 2015-09-23: 45000 [IU] via INTRAVENOUS
  Administered 2015-09-23: 8000 [IU] via INTRAVENOUS

## 2015-09-23 MED ORDER — PRAMIPEXOLE DIHYDROCHLORIDE 1 MG PO TABS
1.0000 mg | ORAL_TABLET | Freq: Every day | ORAL | Status: DC
  Administered 2015-09-24 – 2015-09-25 (×2): 1 mg via ORAL
  Filled 2015-09-23 (×2): qty 1

## 2015-09-23 MED ORDER — DEXMEDETOMIDINE HCL IN NACL 200 MCG/50ML IV SOLN
INTRAVENOUS | Status: DC | PRN
  Administered 2015-09-23: 0.4 ug/kg/h via INTRAVENOUS

## 2015-09-23 MED ORDER — DEXTROSE 5 % IV SOLN
1.5000 g | Freq: Two times a day (BID) | INTRAVENOUS | Status: AC
  Administered 2015-09-23 – 2015-09-25 (×4): 1.5 g via INTRAVENOUS
  Filled 2015-09-23 (×5): qty 1.5

## 2015-09-23 MED ORDER — ACETAMINOPHEN 500 MG PO TABS
1000.0000 mg | ORAL_TABLET | Freq: Four times a day (QID) | ORAL | Status: DC
  Administered 2015-09-24 – 2015-09-26 (×9): 1000 mg via ORAL
  Filled 2015-09-23 (×10): qty 2

## 2015-09-23 MED ORDER — SODIUM CHLORIDE 0.9 % IV SOLN
250.0000 mL | INTRAVENOUS | Status: DC
Start: 2015-09-24 — End: 2015-09-26

## 2015-09-23 MED ORDER — CHLORHEXIDINE GLUCONATE 0.12 % MT SOLN
15.0000 mL | OROMUCOSAL | Status: AC
  Administered 2015-09-23: 15 mL via OROMUCOSAL

## 2015-09-23 MED ORDER — SODIUM CHLORIDE 0.9 % IV SOLN
INTRAVENOUS | Status: DC
  Filled 2015-09-23: qty 2.5

## 2015-09-23 MED ORDER — PANTOPRAZOLE SODIUM 40 MG PO TBEC
40.0000 mg | DELAYED_RELEASE_TABLET | Freq: Every day | ORAL | Status: DC
  Administered 2015-09-25: 40 mg via ORAL
  Filled 2015-09-23: qty 1

## 2015-09-23 MED ORDER — SODIUM CHLORIDE 0.9% FLUSH
3.0000 mL | Freq: Two times a day (BID) | INTRAVENOUS | Status: DC
  Administered 2015-09-24 – 2015-09-25 (×3): 3 mL via INTRAVENOUS

## 2015-09-23 MED ORDER — BISACODYL 10 MG RE SUPP: 10.0000 mg | Freq: Every day | RECTAL | Status: DC

## 2015-09-23 MED ORDER — MIDAZOLAM HCL 5 MG/5ML IJ SOLN
INTRAMUSCULAR | Status: DC | PRN
  Administered 2015-09-23: 3 mg via INTRAVENOUS
  Administered 2015-09-23 (×2): 2 mg via INTRAVENOUS
  Administered 2015-09-23: 3 mg via INTRAVENOUS

## 2015-09-23 MED ORDER — PROPOFOL 10 MG/ML IV BOLUS
INTRAVENOUS | Status: AC
  Filled 2015-09-23: qty 20

## 2015-09-23 MED ORDER — SODIUM CHLORIDE 0.9 % IV SOLN
250.0000 [IU] | INTRAVENOUS | Status: DC | PRN
  Administered 2015-09-23: 1 [IU]/h via INTRAVENOUS

## 2015-09-23 MED ORDER — VECURONIUM BROMIDE 10 MG IV SOLR
INTRAVENOUS | Status: DC | PRN
  Administered 2015-09-23: 3 mg via INTRAVENOUS
  Administered 2015-09-23: 2 mg via INTRAVENOUS
  Administered 2015-09-23: 7 mg via INTRAVENOUS
  Administered 2015-09-23: 5 mg via INTRAVENOUS

## 2015-09-23 MED ORDER — ACETAMINOPHEN 160 MG/5ML PO SOLN: 650.0000 mg | Freq: Once | ORAL | Status: AC

## 2015-09-23 MED ORDER — ASPIRIN 81 MG PO CHEW: 324.0000 mg | CHEWABLE_TABLET | Freq: Every day | ORAL | Status: DC

## 2015-09-23 MED ORDER — ACETAMINOPHEN 650 MG RE SUPP
650.0000 mg | Freq: Once | RECTAL | Status: AC
  Administered 2015-09-23: 650 mg via RECTAL

## 2015-09-23 MED ORDER — DEXTROSE 5 % IV SOLN
1.5000 g | INTRAVENOUS | Status: DC | PRN
  Administered 2015-09-23: 1.5 g via INTRAVENOUS
  Administered 2015-09-23: .75 g via INTRAVENOUS

## 2015-09-23 MED ORDER — MORPHINE SULFATE (PF) 2 MG/ML IV SOLN: 1.0000 mg | INTRAVENOUS | Status: DC | PRN

## 2015-09-23 MED ORDER — PLASMA-LYTE 148 IV SOLN
INTRAVENOUS | Status: DC | PRN
  Administered 2015-09-23: 500 mL

## 2015-09-23 MED ORDER — LIDOCAINE HCL (CARDIAC) 20 MG/ML IV SOLN
INTRAVENOUS | Status: DC | PRN
  Administered 2015-09-23: 100 mg via INTRATRACHEAL

## 2015-09-23 MED ORDER — MAGNESIUM SULFATE 4 GM/100ML IV SOLN
4.0000 g | Freq: Once | INTRAVENOUS | Status: AC
  Administered 2015-09-23: 4 g via INTRAVENOUS
  Filled 2015-09-23: qty 100

## 2015-09-23 MED ORDER — HEPARIN SODIUM (PORCINE) 1000 UNIT/ML IJ SOLN
INTRAMUSCULAR | Status: AC
  Filled 2015-09-23: qty 2

## 2015-09-23 MED ORDER — SUFENTANIL CITRATE 50 MCG/ML IV SOLN
INTRAVENOUS | Status: DC | PRN
  Administered 2015-09-23: 10 ug via INTRAVENOUS
  Administered 2015-09-23: 20 ug via INTRAVENOUS
  Administered 2015-09-23: 10 ug via INTRAVENOUS
  Administered 2015-09-23: 30 ug via INTRAVENOUS
  Administered 2015-09-23: 10 ug via INTRAVENOUS
  Administered 2015-09-23: 20 ug via INTRAVENOUS
  Administered 2015-09-23: 40 ug via INTRAVENOUS
  Administered 2015-09-23: 50 ug via INTRAVENOUS

## 2015-09-23 MED ORDER — MORPHINE SULFATE (PF) 2 MG/ML IV SOLN
2.0000 mg | INTRAVENOUS | Status: DC | PRN
  Administered 2015-09-24 (×2): 4 mg via INTRAVENOUS
  Administered 2015-09-24 (×3): 2 mg via INTRAVENOUS
  Administered 2015-09-25 (×2): 4 mg via INTRAVENOUS
  Filled 2015-09-23 (×2): qty 1
  Filled 2015-09-23: qty 2
  Filled 2015-09-23: qty 1
  Filled 2015-09-23 (×3): qty 2

## 2015-09-23 MED ORDER — ALBUMIN HUMAN 5 % IV SOLN
250.0000 mL | INTRAVENOUS | Status: AC | PRN
  Administered 2015-09-23: 250 mL via INTRAVENOUS

## 2015-09-23 MED ORDER — SODIUM CHLORIDE 0.9% FLUSH: 3.0000 mL | INTRAVENOUS | Status: DC | PRN

## 2015-09-23 MED ORDER — MIDAZOLAM HCL 10 MG/2ML IJ SOLN
INTRAMUSCULAR | Status: AC
  Filled 2015-09-23: qty 2

## 2015-09-23 MED ORDER — LIDOCAINE 2% (20 MG/ML) 5 ML SYRINGE
INTRAMUSCULAR | Status: AC
  Filled 2015-09-23: qty 5

## 2015-09-23 MED ORDER — VECURONIUM BROMIDE 10 MG IV SOLR
INTRAVENOUS | Status: AC
  Filled 2015-09-23: qty 10

## 2015-09-23 MED ORDER — MIDAZOLAM HCL 2 MG/2ML IJ SOLN: 2.0000 mg | INTRAMUSCULAR | Status: DC | PRN

## 2015-09-23 MED ORDER — VANCOMYCIN HCL IN DEXTROSE 1-5 GM/200ML-% IV SOLN
1000.0000 mg | Freq: Once | INTRAVENOUS | Status: AC
  Administered 2015-09-23: 1000 mg via INTRAVENOUS
  Filled 2015-09-23: qty 200

## 2015-09-23 MED ORDER — OXYCODONE HCL 5 MG PO TABS
5.0000 mg | ORAL_TABLET | ORAL | Status: DC | PRN
  Administered 2015-09-23 – 2015-09-24 (×3): 10 mg via ORAL
  Administered 2015-09-24: 5 mg via ORAL
  Administered 2015-09-24: 10 mg via ORAL
  Administered 2015-09-24: 5 mg via ORAL
  Administered 2015-09-24 – 2015-09-26 (×7): 10 mg via ORAL
  Filled 2015-09-23: qty 2
  Filled 2015-09-23 (×2): qty 1
  Filled 2015-09-23 (×10): qty 2

## 2015-09-23 MED ORDER — PRAMIPEXOLE DIHYDROCHLORIDE 1 MG PO TABS
1.0000 mg | ORAL_TABLET | Freq: Once | ORAL | Status: AC
  Administered 2015-09-23: 1 mg via ORAL

## 2015-09-23 MED ORDER — NITROGLYCERIN IN D5W 200-5 MCG/ML-% IV SOLN
INTRAVENOUS | Status: DC | PRN
Start: 2015-09-23 — End: 2015-09-23
  Administered 2015-09-23: 5 ug/min via INTRAVENOUS

## 2015-09-23 MED ORDER — DOCUSATE SODIUM 100 MG PO CAPS
200.0000 mg | ORAL_CAPSULE | Freq: Every day | ORAL | Status: DC
  Administered 2015-09-24 – 2015-09-25 (×2): 200 mg via ORAL
  Filled 2015-09-23 (×2): qty 2

## 2015-09-23 MED ORDER — HEPARIN SODIUM (PORCINE) 1000 UNIT/ML IJ SOLN
INTRAMUSCULAR | Status: AC
  Filled 2015-09-23: qty 1

## 2015-09-23 MED ORDER — PHENYLEPHRINE HCL 10 MG/ML IJ SOLN
20.0000 mg | INTRAVENOUS | Status: DC | PRN
  Administered 2015-09-23: 10 ug/min via INTRAVENOUS

## 2015-09-23 MED ORDER — LACTATED RINGERS IV SOLN
INTRAVENOUS | Status: DC | PRN
  Administered 2015-09-23 (×2): via INTRAVENOUS

## 2015-09-23 MED ORDER — METOPROLOL TARTRATE 25 MG/10 ML ORAL SUSPENSION: 12.5000 mg | Freq: Two times a day (BID) | ORAL | Status: DC

## 2015-09-23 MED ORDER — ONDANSETRON HCL 4 MG/2ML IJ SOLN
4.0000 mg | Freq: Four times a day (QID) | INTRAMUSCULAR | Status: DC | PRN
  Administered 2015-09-23 – 2015-09-25 (×4): 4 mg via INTRAVENOUS
  Filled 2015-09-23 (×4): qty 2

## 2015-09-23 MED ORDER — ASPIRIN EC 325 MG PO TBEC
325.0000 mg | DELAYED_RELEASE_TABLET | Freq: Every day | ORAL | Status: DC
  Administered 2015-09-24 – 2015-09-25 (×2): 325 mg via ORAL
  Filled 2015-09-23 (×2): qty 1

## 2015-09-23 MED ORDER — METOPROLOL TARTRATE 12.5 MG HALF TABLET
12.5000 mg | ORAL_TABLET | Freq: Two times a day (BID) | ORAL | Status: DC
  Administered 2015-09-24 – 2015-09-25 (×2): 12.5 mg via ORAL
  Filled 2015-09-23 (×3): qty 1

## 2015-09-23 MED ORDER — ANTITHROMBIN III (HUMAN) 500 UNITS IV SOLR
521.0000 [IU] | INTRAVENOUS | Status: DC
  Filled 2015-09-23 (×2): qty 10.5

## 2015-09-23 MED FILL — Dexmedetomidine HCl in NaCl 0.9% IV Soln 400 MCG/100ML: INTRAVENOUS | Qty: 100 | Status: AC

## 2015-09-23 MED FILL — Heparin Sodium (Porcine) Inj 1000 Unit/ML: INTRAMUSCULAR | Qty: 30 | Status: AC

## 2015-09-23 MED FILL — Potassium Chloride Inj 2 mEq/ML: INTRAVENOUS | Qty: 40 | Status: AC

## 2015-09-23 MED FILL — Magnesium Sulfate Inj 50%: INTRAMUSCULAR | Qty: 10 | Status: AC

## 2015-09-23 SURGICAL SUPPLY — 87 items
ATRICLIP EXCLUSION 40 STD HAND (Clip) ×1 IMPLANT
BAG DECANTER FOR FLEXI CONT (MISCELLANEOUS) ×3 IMPLANT
BANDAGE ELASTIC 4 VELCRO ST LF (GAUZE/BANDAGES/DRESSINGS) ×2 IMPLANT
BANDAGE ELASTIC 6 VELCRO ST LF (GAUZE/BANDAGES/DRESSINGS) ×2 IMPLANT
BLADE STERNUM SYSTEM 6 (BLADE) ×3 IMPLANT
BNDG GAUZE ELAST 4 BULKY (GAUZE/BANDAGES/DRESSINGS) ×2 IMPLANT
CANISTER SUCTION 2500CC (MISCELLANEOUS) ×3 IMPLANT
CARDIOBLATE CARDIAC ABLATION (MISCELLANEOUS)
CATH CPB KIT GERHARDT (MISCELLANEOUS) ×3 IMPLANT
CATH ROBINSON RED A/P 18FR (CATHETERS) ×2 IMPLANT
CATH THORACIC 28FR (CATHETERS) ×3 IMPLANT
CLAMP ISOLATOR SYNERGY LG (MISCELLANEOUS) ×1 IMPLANT
CRADLE DONUT ADULT HEAD (MISCELLANEOUS) ×3 IMPLANT
DEVICE CARDIOBLATE CARDIAC ABL (MISCELLANEOUS) IMPLANT
DRAIN CHANNEL 28F RND 3/8 FF (WOUND CARE) ×3 IMPLANT
DRAPE CARDIOVASCULAR INCISE (DRAPES) ×3
DRAPE SLUSH/WARMER DISC (DRAPES) ×3 IMPLANT
DRAPE SRG 135X102X78XABS (DRAPES) ×2 IMPLANT
DRSG AQUACEL AG ADV 3.5X14 (GAUZE/BANDAGES/DRESSINGS) ×3 IMPLANT
ELECT BLADE 4.0 EZ CLEAN MEGAD (MISCELLANEOUS) ×3
ELECT REM PT RETURN 9FT ADLT (ELECTROSURGICAL) ×6
ELECTRODE BLDE 4.0 EZ CLN MEGD (MISCELLANEOUS) ×2 IMPLANT
ELECTRODE REM PT RTRN 9FT ADLT (ELECTROSURGICAL) ×4 IMPLANT
FELT TEFLON 1X6 (MISCELLANEOUS) ×3 IMPLANT
GAUZE SPONGE 4X4 12PLY STRL (GAUZE/BANDAGES/DRESSINGS) ×5 IMPLANT
GLOVE BIO SURGEON STRL SZ 6.5 (GLOVE) ×11 IMPLANT
GLOVE BIOGEL PI IND STRL 6 (GLOVE) IMPLANT
GLOVE BIOGEL PI IND STRL 6.5 (GLOVE) IMPLANT
GLOVE BIOGEL PI INDICATOR 6 (GLOVE) ×3
GLOVE BIOGEL PI INDICATOR 6.5 (GLOVE) ×3
GOWN STRL REUS W/ TWL LRG LVL3 (GOWN DISPOSABLE) ×8 IMPLANT
GOWN STRL REUS W/TWL LRG LVL3 (GOWN DISPOSABLE) ×21
HEMOSTAT POWDER SURGIFOAM 1G (HEMOSTASIS) ×9 IMPLANT
HEMOSTAT SURGICEL 2X14 (HEMOSTASIS) ×3 IMPLANT
KIT BASIN OR (CUSTOM PROCEDURE TRAY) ×3 IMPLANT
KIT CATH SUCT 8FR (CATHETERS) ×3 IMPLANT
KIT ROOM TURNOVER OR (KITS) ×3 IMPLANT
KIT SUCTION CATH 14FR (SUCTIONS) ×6 IMPLANT
KIT VASOVIEW 6 PRO VH 2400 (KITS) ×2 IMPLANT
LEAD PACING MYOCARDI (MISCELLANEOUS) ×3 IMPLANT
MARKER GRAFT CORONARY BYPASS (MISCELLANEOUS) ×9 IMPLANT
NS IRRIG 1000ML POUR BTL (IV SOLUTION) ×15 IMPLANT
PACK OPEN HEART (CUSTOM PROCEDURE TRAY) ×3 IMPLANT
PAD ARMBOARD 7.5X6 YLW CONV (MISCELLANEOUS) ×5 IMPLANT
PAD ELECT DEFIB RADIOL ZOLL (MISCELLANEOUS) ×3 IMPLANT
PENCIL BUTTON HOLSTER BLD 10FT (ELECTRODE) ×2 IMPLANT
PROBE CRYO2-ABLATION MALLABLE (MISCELLANEOUS) IMPLANT
SET CARDIOPLEGIA MPS 5001102 (MISCELLANEOUS) ×1 IMPLANT
SPONGE GAUZE 4X4 12PLY STER LF (GAUZE/BANDAGES/DRESSINGS) ×1 IMPLANT
SPONGE LAP 18X18 X RAY DECT (DISPOSABLE) ×1 IMPLANT
SURGIFLO W/THROMBIN 8M KIT (HEMOSTASIS) ×1 IMPLANT
SUT BONE WAX W31G (SUTURE) ×3 IMPLANT
SUT ETHIBOND 2 0 SH (SUTURE) ×12
SUT ETHIBOND 2 0 SH 36X2 (SUTURE) IMPLANT
SUT PROLENE 3 0 SH1 36 (SUTURE) ×3 IMPLANT
SUT PROLENE 4 0 RB 1 (SUTURE) ×9
SUT PROLENE 4 0 TF (SUTURE) ×6 IMPLANT
SUT PROLENE 4-0 RB1 .5 CRCL 36 (SUTURE) IMPLANT
SUT PROLENE 5 0 C 1 36 (SUTURE) ×2 IMPLANT
SUT PROLENE 6 0 C 1 30 (SUTURE) ×2 IMPLANT
SUT PROLENE 6 0 CC (SUTURE) ×6 IMPLANT
SUT PROLENE 7 0 BV1 MDA (SUTURE) ×3 IMPLANT
SUT PROLENE 8 0 BV175 6 (SUTURE) ×2 IMPLANT
SUT SILK  1 MH (SUTURE) ×3
SUT SILK 1 MH (SUTURE) IMPLANT
SUT SILK 1 TIES 10X30 (SUTURE) ×1 IMPLANT
SUT SILK 2 0 SH CR/8 (SUTURE) ×2 IMPLANT
SUT SILK 2 0 TIES 10X30 (SUTURE) ×1 IMPLANT
SUT SILK 2 0 TIES 17X18 (SUTURE) ×3
SUT SILK 2-0 18XBRD TIE BLK (SUTURE) IMPLANT
SUT SILK 3 0 SH CR/8 (SUTURE) ×1 IMPLANT
SUT SILK 4 0 TIE 10X30 (SUTURE) ×2 IMPLANT
SUT STEEL 6MS V (SUTURE) ×3 IMPLANT
SUT STEEL SZ 6 DBL 3X14 BALL (SUTURE) ×3 IMPLANT
SUT TEM PAC WIRE 2 0 SH (SUTURE) ×4 IMPLANT
SUT VIC AB 1 CTX 18 (SUTURE) ×6 IMPLANT
SUT VIC AB 2-0 CTX 27 (SUTURE) ×2 IMPLANT
SUT VIC AB 3-0 X1 27 (SUTURE) ×2 IMPLANT
SUTURE E-PAK OPEN HEART (SUTURE) ×3 IMPLANT
SYSTEM SAHARA CHEST DRAIN ATS (WOUND CARE) ×3 IMPLANT
TAPE CLOTH SURG 4X10 WHT LF (GAUZE/BANDAGES/DRESSINGS) ×1 IMPLANT
TOWEL OR 17X24 6PK STRL BLUE (TOWEL DISPOSABLE) ×6 IMPLANT
TOWEL OR 17X26 10 PK STRL BLUE (TOWEL DISPOSABLE) ×6 IMPLANT
TRAY FOLEY IC TEMP SENS 16FR (CATHETERS) ×3 IMPLANT
TUBING INSUFFLATION (TUBING) ×2 IMPLANT
UNDERPAD 30X30 INCONTINENT (UNDERPADS AND DIAPERS) ×3 IMPLANT
WATER STERILE IRR 1000ML POUR (IV SOLUTION) ×6 IMPLANT

## 2015-09-23 NOTE — Progress Notes (Signed)
  Echocardiogram Echocardiogram Transesophageal has been performed.  Diamond Nickel 09/23/2015, 8:57 AM

## 2015-09-23 NOTE — Anesthesia Preprocedure Evaluation (Addendum)
Anesthesia Evaluation  Patient identified by MRN, date of birth, ID band Patient awake    Reviewed: Allergy & Precautions, NPO status , Patient's Chart, lab work & pertinent test results  Airway Mallampati: II  TM Distance: <3 FB Neck ROM: Full    Dental  (+) Teeth Intact, Dental Advisory Given   Pulmonary sleep apnea and Continuous Positive Airway Pressure Ventilation , former smoker,    breath sounds clear to auscultation- rhonchi       Cardiovascular hypertension, Pt. on medications + CAD   Rhythm:Regular Rate:Normal     Neuro/Psych    GI/Hepatic Neg liver ROS, GERD  ,  Endo/Other  negative endocrine ROS  Renal/GU negative Renal ROS     Musculoskeletal   Abdominal   Peds  Hematology   Anesthesia Other Findings   Reproductive/Obstetrics                            Anesthesia Physical Anesthesia Plan  ASA: III  Anesthesia Plan: General   Post-op Pain Management:    Induction: Intravenous  Airway Management Planned: Oral ETT  Additional Equipment: Arterial line, CVP, PA Cath, TEE, 3D TEE and Ultrasound Guidance Line Placement  Intra-op Plan:   Post-operative Plan: Post-operative intubation/ventilation  Informed Consent: I have reviewed the patients History and Physical, chart, labs and discussed the procedure including the risks, benefits and alternatives for the proposed anesthesia with the patient or authorized representative who has indicated his/her understanding and acceptance.   Dental advisory given  Plan Discussed with: CRNA, Anesthesiologist and Surgeon  Anesthesia Plan Comments:        Anesthesia Quick Evaluation

## 2015-09-23 NOTE — Anesthesia Procedure Notes (Addendum)
Central Venous Catheter Insertion Performed by: anesthesiologist Patient location: Pre-op. Preanesthetic checklist: patient identified, IV checked, site marked, risks and benefits discussed, surgical consent, monitors and equipment checked, pre-op evaluation, timeout performed and anesthesia consent Position: Trendelenburg Lidocaine 1% used for infiltration Landmarks identified and Seldinger technique used Catheter size: 8.5 Fr Central line and PA cath was placed.Sheath introducer Swan type and PA catheter depth:thermodilation and 50PA Cath depth:50 Procedure performed using ultrasound guided technique. Attempts: 1 Following insertion, line sutured and dressing applied. Post procedure assessment: blood return through all ports, free fluid flow and no air. Patient tolerated the procedure well with no immediate complications.   Procedure Name: Intubation Date/Time: 09/23/2015 7:46 AM Performed by: Melina Copa, Camika Marsico R Pre-anesthesia Checklist: Patient identified, Emergency Drugs available, Suction available and Patient being monitored Patient Re-evaluated:Patient Re-evaluated prior to inductionOxygen Delivery Method: Circle System Utilized Preoxygenation: Pre-oxygenation with 100% oxygen Intubation Type: IV induction Ventilation: Mask ventilation with difficulty and Oral airway inserted - appropriate to patient size Laryngoscope Size: Mac and 3 Grade View: Grade II Tube type: Oral Tube size: 8.5 mm Number of attempts: 1 Airway Equipment and Method: Stylet Placement Confirmation: ETT inserted through vocal cords under direct vision,  positive ETCO2 and breath sounds checked- equal and bilateral Secured at: 22 cm Tube secured with: Tape Dental Injury: Teeth and Oropharynx as per pre-operative assessment

## 2015-09-23 NOTE — Procedures (Signed)
Extubation Procedure Note  Patient Details:   Name: [name] DOB: 1940-10-16 MRN: [id number]   Airway Documentation:     Evaluation  O2 sats: stable throughout Complications: No apparent complications Patient did tolerate procedure well. Bilateral Breath Sounds: Clear, Diminished   Yes   Patient extubated due to rapid wean protocol. Patient's had a VC of 1.1L and a NIF of -25. Cuff leak was heard. No stridor was noted. RN was at bedside with RT during extubation. Patient is currently on a 6L [place]. RT will continue to monitor.  Tiburcio Bash 09/23/2015, 4:36 PM

## 2015-09-23 NOTE — Progress Notes (Signed)
CT surgery p.m. Rounds  Status post CABG-maze  Atrially paced, extubated, off all drips Neuro intact P.m. labs reviewed and are satisfactory Continue current care

## 2015-09-23 NOTE — Care Management Note (Addendum)
Case Management Note  Patient Details  Name: [name] MRN: [id number] Date of Birth: [birth year]  Subjective/Objective: Pt admitted for A Fib and Chest Pain. Plan is for cardiac cath 09-20-15. Pt is from home and plans to return home.         Action/Plan: CM did provide pt with an agency list for [facility]. CM will check back with pt in regards to agency choice. CM will continue to monitor.   Expected Discharge Date:                  Expected Discharge Plan:  [place]  In-House Referral:  NA  Discharge planning Services  CM Consult  Post Acute Care Choice:  Home Health Choice offered to:  Patient  DME Arranged:  N/A DME Agency:  NA  HH Arranged:  PT HH Agency:     Status of Service:  In process, will continue to follow  Medicare Important Message Given:    Date Medicare IM Given:    Medicare IM give by:    Date Additional Medicare IM Given:    Additional Medicare Important Message give by:     If discussed at [place] of Stay Meetings, dates discussed:    Additional Comments: 09/23/2015 Pt transferred to ICU s/p CABG - pt remains intubated.  CM spoke with daughter Ena Dawley; pt lives alone and family actively working discharge plan that covers 24/7 for first week.  Maryclare Labrador, RN 09/23/2015, 3:52 PM

## 2015-09-23 NOTE — Brief Op Note (Addendum)
      [address]       [place] [address]             [phone]      09/23/2015  10:59 AM  PATIENT:  [name]  75 y.o. female  PRE-OPERATIVE DIAGNOSIS:  1. Single vessel CAD 2. AFIB  POST-OPERATIVE DIAGNOSIS:  1. Single vessel CAD 2. AFIB  PROCEDURE: TRANSESOPHAGEAL ECHOCARDIOGRAM (TEE), MEDIAN STERNOTOMY for CORONARY ARTERY BYPASS GRAFTING (CABG) x 1 (LEFT INTERNAL MAMMARY TO THE LAD),  CLIPPING OF ATRIAL APPENDAGE, MAZE - left side lesions, pulmonary vein isolation     SURGEON:  Surgeon(s) and Role:    * Grace Isaac, MD - Primary  PHYSICIAN ASSISTANT: Lars Pinks PA-C  ANESTHESIA:   general  EBL:  Total I/O In: -  Out: 300 [Urine:300]  DRAINS: Chest tubes placed in the mediastinal and pleural spaces   COUNTS CORRECT:  YES  DICTATION: .Dragon Dictation  PLAN OF CARE: Admit to inpatient   PATIENT DISPOSITION:  ICU - intubated and hemodynamically stable.   Delay start of Pharmacological VTE agent (>24hrs) due to surgical blood loss or risk of bleeding: yes   BASELINE WEIGHT: 103 kg  Maze Procedure  Radiofrequency:  Yes.  Bipolar: Yes.  Cut-and-sew:  No.  Cryo: No  Lesions (select all that apply):    1   Pulmonary Vein Isolation and     7  LAA Ligation/Removal

## 2015-09-23 NOTE — Progress Notes (Addendum)
   Just back from cardiac surgery with stable hemodynamics. A-paced rhythm.  She received on-pump LIMA to LAD, left atrial MAZE and left atrial clip.  Has residual moderate RCA disease that will be followed clinically and treated with PCI when appropriate.  We will follow for evidence of postop arrhythmia.

## 2015-09-23 NOTE — Transfer of Care (Signed)
Immediate Anesthesia Transfer of Care Note  Patient: [name]  Procedure(s) Performed: Procedure(s): CORONARY ARTERY BYPASS GRAFTING (CABG) TIMES 1 USING LEFT INTERNAL MAMMARY TO THE LAD (N/A)  CLIPPING OF ATRIAL APPENDAGE (N/A) TRANSESOPHAGEAL ECHOCARDIOGRAM (TEE) (N/A) MAZE (N/A)  Patient Location: ICU  Anesthesia Type:General  Level of Consciousness: unresponsive and Patient remains intubated per anesthesia plan  Airway & Oxygen Therapy: Patient re-intubated and Patient remains intubated per anesthesia plan  Post-op Assessment: Report given to RN and Post -op Vital signs reviewed and stable  Post vital signs: Reviewed and stable  Last Vitals:  Filed Vitals:   09/22/15 2021 09/23/15 0500  BP: 139/74 144/64  Pulse: 66 68  Temp: 36.8 C 36.6 C  Resp: 18 18    Last Pain:  Filed Vitals:   09/23/15 0548  PainSc: 0-No pain      Patients Stated Pain Goal: 0 ([id number])  Complications: No apparent anesthesia complications

## 2015-09-23 NOTE — Anesthesia Postprocedure Evaluation (Signed)
Anesthesia Post Note  Patient: [name]  Procedure(s) Performed: Procedure(s) (LRB): CORONARY ARTERY BYPASS GRAFTING (CABG) TIMES 1 USING LEFT INTERNAL MAMMARY TO THE LAD (N/A)  CLIPPING OF ATRIAL APPENDAGE (N/A) TRANSESOPHAGEAL ECHOCARDIOGRAM (TEE) (N/A) MAZE (N/A)  Patient location during evaluation: SICU Level of consciousness: patient remains intubated per anesthesia plan Pain management: pain level controlled Vital Signs Assessment: post-procedure vital signs reviewed and stable Respiratory status: patient re-intubated Cardiovascular status: stable Anesthetic complications: no    Last Vitals:  Filed Vitals:   09/23/15 1345 09/23/15 1400  BP:  99/53  Pulse: 80 80  Temp: 36.3 C 36.4 C  Resp: 12 12    Last Pain:  Filed Vitals:   09/23/15 1414  PainSc: 0-No pain                 EDWARDS,Lonzy Mato

## 2015-09-24 ENCOUNTER — Inpatient Hospital Stay (HOSPITAL_COMMUNITY): Payer: Medicare Other

## 2015-09-24 LAB — CBC
HCT: 26.5 % — ABNORMAL LOW (ref 36.0–46.0)
HCT: 26.3 % — ABNORMAL LOW (ref 36.0–46.0)
HEMOGLOBIN: 8.4 g/dL — AB (ref 12.0–15.0)
Hemoglobin: 8.2 g/dL — ABNORMAL LOW (ref 12.0–15.0)
MCH: 28.7 pg (ref 26.0–34.0)
MCH: 28.4 pg (ref 26.0–34.0)
MCHC: 31.7 g/dL (ref 30.0–36.0)
MCHC: 31.2 g/dL (ref 30.0–36.0)
MCV: 90.4 fL (ref 78.0–100.0)
MCV: 91 fL (ref 78.0–100.0)
PLATELETS: 153 10*3/uL (ref 150–400)
PLATELETS: 155 10*3/uL (ref 150–400)
RBC: 2.93 MIL/uL — AB (ref 3.87–5.11)
RBC: 2.89 MIL/uL — AB (ref 3.87–5.11)
RDW: 15.2 % (ref 11.5–15.5)
RDW: 15.6 % — ABNORMAL HIGH (ref 11.5–15.5)
WBC: 13.1 10*3/uL — AB (ref 4.0–10.5)
WBC: 12.5 10*3/uL — AB (ref 4.0–10.5)

## 2015-09-24 LAB — POCT I-STAT, CHEM 8
BUN: 15 mg/dL (ref 6–20)
Calcium, Ion: 1.25 mmol/L (ref 1.13–1.30)
Chloride: 96 mmol/L — ABNORMAL LOW (ref 101–111)
Creatinine, Ser: 0.9 mg/dL (ref 0.44–1.00)
Glucose, Bld: 136 mg/dL — ABNORMAL HIGH (ref 65–99)
HCT: 27 % — ABNORMAL LOW (ref 36.0–46.0)
Hemoglobin: 9.2 g/dL — ABNORMAL LOW (ref 12.0–15.0)
Potassium: 4 mmol/L (ref 3.5–5.1)
Sodium: 136 mmol/L (ref 135–145)
TCO2: 26 mmol/L (ref 0–100)

## 2015-09-24 LAB — GLUCOSE, CAPILLARY
GLUCOSE-CAPILLARY: 110 mg/dL — AB (ref 65–99)
GLUCOSE-CAPILLARY: 115 mg/dL — AB (ref 65–99)
GLUCOSE-CAPILLARY: 118 mg/dL — AB (ref 65–99)
GLUCOSE-CAPILLARY: 119 mg/dL — AB (ref 65–99)
GLUCOSE-CAPILLARY: 134 mg/dL — AB (ref 65–99)
GLUCOSE-CAPILLARY: 136 mg/dL — AB (ref 65–99)
GLUCOSE-CAPILLARY: 145 mg/dL — AB (ref 65–99)
Glucose-Capillary: 120 mg/dL — ABNORMAL HIGH (ref 65–99)
Glucose-Capillary: 113 mg/dL — ABNORMAL HIGH (ref 65–99)
Glucose-Capillary: 109 mg/dL — ABNORMAL HIGH (ref 65–99)
Glucose-Capillary: 112 mg/dL — ABNORMAL HIGH (ref 65–99)
Glucose-Capillary: 125 mg/dL — ABNORMAL HIGH (ref 65–99)
Glucose-Capillary: 122 mg/dL — ABNORMAL HIGH (ref 65–99)
Glucose-Capillary: 119 mg/dL — ABNORMAL HIGH (ref 65–99)
Glucose-Capillary: 111 mg/dL — ABNORMAL HIGH (ref 65–99)
Glucose-Capillary: 115 mg/dL — ABNORMAL HIGH (ref 65–99)
Glucose-Capillary: 102 mg/dL — ABNORMAL HIGH (ref 65–99)
Glucose-Capillary: 163 mg/dL — ABNORMAL HIGH (ref 65–99)
Glucose-Capillary: 118 mg/dL — ABNORMAL HIGH (ref 65–99)
Glucose-Capillary: 133 mg/dL — ABNORMAL HIGH (ref 65–99)

## 2015-09-24 LAB — CREATININE, SERUM
CREATININE: 1.04 mg/dL — AB (ref 0.44–1.00)
GFR calc Af Amer: >60 mL/min (ref >60)
GFR, EST NON AFRICAN AMERICAN: 52 mL/min — AB (ref >60)

## 2015-09-24 LAB — BASIC METABOLIC PANEL
ANION GAP: 9 (ref 5–15)
BUN: 11 mg/dL (ref 6–20)
CHLORIDE: 102 mmol/L (ref 101–111)
CO2: 23 mmol/L (ref 22–32)
Calcium: 8.4 mg/dL — ABNORMAL LOW (ref 8.9–10.3)
Creatinine, Ser: 0.78 mg/dL (ref 0.44–1.00)
GFR calc non Af Amer: >60 mL/min (ref >60)
Glucose, Bld: 110 mg/dL — ABNORMAL HIGH (ref 65–99)
POTASSIUM: 4.1 mmol/L (ref 3.5–5.1)
SODIUM: 134 mmol/L — AB (ref 135–145)

## 2015-09-24 LAB — MAGNESIUM
MAGNESIUM: 2.1 mg/dL (ref 1.7–2.4)
MAGNESIUM: 1.8 mg/dL (ref 1.7–2.4)

## 2015-09-24 LAB — POCT I-STAT 3, ART BLOOD GAS (G3+)
ACID-BASE DEFICIT: 3 mmol/L — AB (ref 0.0–2.0)
BICARBONATE: 22.6 meq/L (ref 20.0–24.0)
O2 Saturation: 92 %
PH ART: 7.353 (ref 7.350–7.450)
TCO2: 24 mmol/L (ref 0–100)
pCO2 arterial: 40.5 mmHg (ref 35.0–45.0)
pO2, Arterial: 65 mmHg — ABNORMAL LOW (ref 80.0–100.0)

## 2015-09-24 LAB — HEMOGLOBIN A1C
Hgb A1c MFr Bld: 5.8 % — ABNORMAL HIGH (ref 4.8–5.6)
Mean Plasma Glucose: 120 mg/dL

## 2015-09-24 MED ORDER — FUROSEMIDE 10 MG/ML IJ SOLN
40.0000 mg | Freq: Two times a day (BID) | INTRAMUSCULAR | Status: DC
  Administered 2015-09-24 – 2015-09-25 (×3): 40 mg via INTRAVENOUS
  Filled 2015-09-24 (×4): qty 4

## 2015-09-24 MED ORDER — WARFARIN SODIUM 2 MG PO TABS
2.0000 mg | ORAL_TABLET | Freq: Every day | ORAL | Status: DC
  Filled 2015-09-24: qty 1

## 2015-09-24 MED ORDER — WARFARIN - PHYSICIAN DOSING INPATIENT: Freq: Every day | Status: DC

## 2015-09-24 MED ORDER — LISINOPRIL 10 MG PO TABS
10.0000 mg | ORAL_TABLET | Freq: Every day | ORAL | Status: DC
Start: 2015-09-24 — End: 2015-09-27
  Administered 2015-09-24 – 2015-09-26 (×3): 10 mg via ORAL
  Filled 2015-09-24 (×3): qty 1

## 2015-09-24 MED ORDER — INSULIN DETEMIR 100 UNIT/ML ~~LOC~~ SOLN
10.0000 [IU] | Freq: Two times a day (BID) | SUBCUTANEOUS | Status: DC
  Administered 2015-09-24 – 2015-09-26 (×6): 10 [IU] via SUBCUTANEOUS
  Filled 2015-09-24 (×7): qty 0.1

## 2015-09-24 MED ORDER — INSULIN ASPART 100 UNIT/ML ~~LOC~~ SOLN
0.0000 [IU] | SUBCUTANEOUS | Status: DC
  Administered 2015-09-24 – 2015-09-25 (×4): 2 [IU] via SUBCUTANEOUS

## 2015-09-24 MED ORDER — METOCLOPRAMIDE HCL 5 MG/ML IJ SOLN
10.0000 mg | Freq: Four times a day (QID) | INTRAMUSCULAR | Status: AC
  Administered 2015-09-24 – 2015-09-25 (×6): 10 mg via INTRAVENOUS
  Filled 2015-09-24 (×6): qty 2

## 2015-09-24 MED ORDER — SIMETHICONE 80 MG PO CHEW
80.0000 mg | CHEWABLE_TABLET | Freq: Once | ORAL | Status: AC
  Administered 2015-09-24: 80 mg via ORAL
  Filled 2015-09-24: qty 1

## 2015-09-24 NOTE — Progress Notes (Signed)
1 Day Post-Op Procedure(s) (LRB): CORONARY ARTERY BYPASS GRAFTING (CABG) TIMES 1 USING LEFT INTERNAL MAMMARY TO THE LAD (N/A)  CLIPPING OF ATRIAL APPENDAGE (N/A) TRANSESOPHAGEAL ECHOCARDIOGRAM (TEE) (N/A) MAZE (N/A) Subjective: Doing well after CABG -maze procedure although somewhat weak Sinus rhythm, pain well-controlled Ready to mobilize out of bed  Objective: Vital signs in last 24 hours: Temp:  [96.8 F (36 C)-99.7 F (37.6 C)] 98.8 F (37.1 C) (05/06 1100) Pulse Rate:  [71-80] 71 (05/06 1100) Cardiac Rhythm:  [-] Normal sinus rhythm (05/06 1000) Resp:  [12-24] 13 (05/06 1100) BP: (94-136)/(38-114) 122/45 mmHg (05/06 1100) SpO2:  [92 %-100 %] 93 % (05/06 1100) Arterial Line BP: (115-186)/(41-69) 161/47 mmHg (05/06 1100) FiO2 (%):  [40 %-50 %] 40 % (05/05 1600) Weight:  [228 lb 2.8 oz (103.5 kg)-238 lb 8.6 oz (108.2 kg)] 238 lb 8.6 oz (108.2 kg) (05/06 0600)  Hemodynamic parameters for last 24 hours: PAP: (16-39)/(5-29) 28/15 mmHg CO:  [4 L/min-7.5 L/min] 6.3 L/min CI:  [1.9 L/min/m2-3.5 L/min/m2] 3 L/min/m2  Intake/Output from previous day: 05/05 0701 - 05/06 0700 In: 4843.8 [I.V.:3763.8; Blood:250; NG/GT:30; IV Piggyback:800] Out: 2715 [Urine:1895; Blood:500; Chest Tube:320] Intake/Output this shift: Total I/O In: 230 [I.V.:180; IV Piggyback:50] Out: 180 [Urine:170; Chest Tube:10]       Exam    General- alert and comfortable   Lungs- clear without rales, wheezes   Cor- regular rate and rhythm, no murmur , gallop   Abdomen- soft, non-tender   Extremities - warm, non-tender, minimal edema   Neuro- oriented, appropriate, no focal weakness   Lab Results:  Recent Labs  09/23/15 1855 09/23/15 1856 09/24/15 0429  WBC 12.2*  --  13.1*  HGB 8.7* 10.5* 8.4*  HCT 27.5* 31.0* 26.5*  PLT 164  --  153   BMET:  Recent Labs  09/23/15 0345  09/23/15 1856 09/24/15 0429  NA 140  < > 137 134*  K 3.9  < > 4.3 4.1  CL 103  < > 103 102  CO2 24  --   --  23   GLUCOSE 100*  < > 146* 110*  BUN 12  < > 11 11  CREATININE 1.07*  < > 0.70 0.78  CALCIUM 9.4  --   --  8.4*  < > = values in this interval not displayed.  PT/INR:  Recent Labs  09/23/15 1245  LABPROT 16.5*  INR 1.32   ABG    Component Value Date/Time   PHART 7.353 09/24/2015 0357   HCO3 22.6 09/24/2015 0357   TCO2 24 09/24/2015 0357   ACIDBASEDEF 3.0* 09/24/2015 0357   O2SAT 92.0 09/24/2015 0357   CBG (last 3)   Recent Labs  09/24/15 0355 09/24/15 0553 09/24/15 0753  GLUCAP 119* 111* 115*    Assessment/Plan: S/P Procedure(s) (LRB): CORONARY ARTERY BYPASS GRAFTING (CABG) TIMES 1 USING LEFT INTERNAL MAMMARY TO THE LAD (N/A)  CLIPPING OF ATRIAL APPENDAGE (N/A) TRANSESOPHAGEAL ECHOCARDIOGRAM (TEE) (N/A) MAZE (N/A) Mobilize Diuresis Diabetes control d/c tubes/lines See progression orders Start by mouth Coumadin tomorrow   LOS: 4 days    Tharon Aquas Trigt III 09/24/2015

## 2015-09-24 NOTE — Progress Notes (Signed)
CT Surgery PM Rounds  nsr OOB to chair Pm labs good Cont current care

## 2015-09-24 NOTE — Progress Notes (Signed)
Post-op management per CT surgery. No significant arrhythmias noted on telemetry   Zandra Abts MD

## 2015-09-25 ENCOUNTER — Inpatient Hospital Stay (HOSPITAL_COMMUNITY): Payer: Medicare Other

## 2015-09-25 LAB — POCT I-STAT, CHEM 8
BUN: 25 mg/dL — ABNORMAL HIGH (ref 6–20)
CALCIUM ION: 1.22 mmol/L (ref 1.13–1.30)
CREATININE: 1.2 mg/dL — AB (ref 0.44–1.00)
Chloride: 94 mmol/L — ABNORMAL LOW (ref 101–111)
GLUCOSE: 124 mg/dL — AB (ref 65–99)
HEMATOCRIT: 27 % — AB (ref 36.0–46.0)
HEMOGLOBIN: 9.2 g/dL — AB (ref 12.0–15.0)
Potassium: 4 mmol/L (ref 3.5–5.1)
Sodium: 132 mmol/L — ABNORMAL LOW (ref 135–145)
TCO2: 27 mmol/L (ref 0–100)

## 2015-09-25 LAB — CBC
HCT: 24.6 % — ABNORMAL LOW (ref 36.0–46.0)
Hemoglobin: 7.7 g/dL — ABNORMAL LOW (ref 12.0–15.0)
MCH: 28.6 pg (ref 26.0–34.0)
MCHC: 31.3 g/dL (ref 30.0–36.0)
MCV: 91.4 fL (ref 78.0–100.0)
Platelets: 136 10*3/uL — ABNORMAL LOW (ref 150–400)
RBC: 2.69 MIL/uL — ABNORMAL LOW (ref 3.87–5.11)
RDW: 15.7 % — ABNORMAL HIGH (ref 11.5–15.5)
WBC: 10.7 10*3/uL — ABNORMAL HIGH (ref 4.0–10.5)

## 2015-09-25 LAB — BASIC METABOLIC PANEL
Anion gap: 5 (ref 5–15)
BUN: 18 mg/dL (ref 6–20)
CO2: 27 mmol/L (ref 22–32)
Calcium: 8.3 mg/dL — ABNORMAL LOW (ref 8.9–10.3)
Chloride: 99 mmol/L — ABNORMAL LOW (ref 101–111)
Creatinine, Ser: 1.15 mg/dL — ABNORMAL HIGH (ref 0.44–1.00)
GFR calc Af Amer: 53 mL/min — ABNORMAL LOW (ref >60)
GFR calc non Af Amer: 46 mL/min — ABNORMAL LOW (ref >60)
Glucose, Bld: 97 mg/dL (ref 65–99)
Potassium: 3.8 mmol/L (ref 3.5–5.1)
Sodium: 131 mmol/L — ABNORMAL LOW (ref 135–145)

## 2015-09-25 LAB — GLUCOSE, CAPILLARY
GLUCOSE-CAPILLARY: 122 mg/dL — AB (ref 65–99)
GLUCOSE-CAPILLARY: 120 mg/dL — AB (ref 65–99)
Glucose-Capillary: 102 mg/dL — ABNORMAL HIGH (ref 65–99)
Glucose-Capillary: 103 mg/dL — ABNORMAL HIGH (ref 65–99)
Glucose-Capillary: 131 mg/dL — ABNORMAL HIGH (ref 65–99)

## 2015-09-25 LAB — PROTIME-INR
INR: 1.11 (ref 0.00–1.49)
Prothrombin Time: 14.5 seconds (ref 11.6–15.2)

## 2015-09-25 MED ORDER — AMIODARONE HCL IN DEXTROSE 360-4.14 MG/200ML-% IV SOLN
60.0000 mg/h | INTRAVENOUS | Status: DC
  Administered 2015-09-25 (×2): 60 mg/h via INTRAVENOUS
  Filled 2015-09-25: qty 200

## 2015-09-25 MED ORDER — AMIODARONE HCL IN DEXTROSE 360-4.14 MG/200ML-% IV SOLN
INTRAVENOUS | Status: AC
  Filled 2015-09-25: qty 200

## 2015-09-25 MED ORDER — FUROSEMIDE 10 MG/ML IJ SOLN: 40.0000 mg | Freq: Every day | INTRAMUSCULAR | Status: DC

## 2015-09-25 MED ORDER — AMIODARONE HCL 200 MG PO TABS
200.0000 mg | ORAL_TABLET | Freq: Two times a day (BID) | ORAL | Status: DC
  Administered 2015-09-25 – 2015-09-30 (×10): 200 mg via ORAL
  Filled 2015-09-25 (×10): qty 1

## 2015-09-25 MED ORDER — AMIODARONE HCL 200 MG PO TABS: 200.0000 mg | ORAL_TABLET | Freq: Every day | ORAL | Status: DC

## 2015-09-25 MED ORDER — ALUM & MAG HYDROXIDE-SIMETH 200-200-20 MG/5ML PO SUSP: 15.0000 mL | ORAL | Status: DC | PRN

## 2015-09-25 MED ORDER — PRAMIPEXOLE DIHYDROCHLORIDE 1 MG PO TABS
1.0000 mg | ORAL_TABLET | Freq: Once | ORAL | Status: AC
  Administered 2015-09-25: 1 mg via ORAL
  Filled 2015-09-25: qty 1

## 2015-09-25 MED ORDER — AMIODARONE LOAD VIA INFUSION
150.0000 mg | Freq: Once | INTRAVENOUS | Status: AC
Start: 2015-09-25 — End: 2015-09-25
  Administered 2015-09-25: 150 mg via INTRAVENOUS

## 2015-09-25 MED ORDER — CHLORHEXIDINE GLUCONATE CLOTH 2 % EX PADS
6.0000 | MEDICATED_PAD | Freq: Every day | CUTANEOUS | Status: DC
  Administered 2015-09-25: 6 via TOPICAL

## 2015-09-25 MED ORDER — AMIODARONE HCL IN DEXTROSE 360-4.14 MG/200ML-% IV SOLN
30.0000 mg/h | INTRAVENOUS | Status: DC
  Administered 2015-09-25: 30 mg/h via INTRAVENOUS
  Filled 2015-09-25: qty 200

## 2015-09-25 MED ORDER — FE FUMARATE-B12-VIT C-FA-IFC PO CAPS
1.0000 | ORAL_CAPSULE | Freq: Three times a day (TID) | ORAL | Status: DC
  Administered 2015-09-25 – 2015-09-30 (×16): 1 via ORAL
  Filled 2015-09-25 (×16): qty 1

## 2015-09-25 MED ORDER — AMIODARONE HCL IN DEXTROSE 360-4.14 MG/200ML-% IV SOLN: 30.0000 mg/h | INTRAVENOUS | Status: DC

## 2015-09-25 MED ORDER — WARFARIN SODIUM 2.5 MG PO TABS
2.5000 mg | ORAL_TABLET | Freq: Every day | ORAL | Status: DC
Start: 2015-09-25 — End: 2015-09-26
  Administered 2015-09-25: 2.5 mg via ORAL
  Filled 2015-09-25: qty 1

## 2015-09-25 MED ORDER — PANTOPRAZOLE SODIUM 40 MG PO TBEC
40.0000 mg | DELAYED_RELEASE_TABLET | Freq: Two times a day (BID) | ORAL | Status: DC
  Administered 2015-09-25: 40 mg via ORAL
  Filled 2015-09-25: qty 1

## 2015-09-25 MED ORDER — PRAMIPEXOLE DIHYDROCHLORIDE 1 MG PO TABS
2.0000 mg | ORAL_TABLET | Freq: Every day | ORAL | Status: DC
  Administered 2015-09-26 – 2015-09-29 (×4): 2 mg via ORAL
  Filled 2015-09-25 (×5): qty 2
  Filled 2015-09-25: qty 8

## 2015-09-25 MED ORDER — CETYLPYRIDINIUM CHLORIDE 0.05 % MT LIQD
7.0000 mL | Freq: Two times a day (BID) | OROMUCOSAL | Status: DC
  Administered 2015-09-25 – 2015-09-30 (×10): 7 mL via OROMUCOSAL

## 2015-09-25 NOTE — Progress Notes (Addendum)
2 Days Post-Op Procedure(s) (LRB): CORONARY ARTERY BYPASS GRAFTING (CABG) TIMES 1 USING LEFT INTERNAL MAMMARY TO THE LAD (N/A)  CLIPPING OF ATRIAL APPENDAGE (N/A) TRANSESOPHAGEAL ECHOCARDIOGRAM (TEE) (N/A) MAZE (N/A) Subjective: Stable night after CABG-maze procedure Patient developed rapid atrial fib this a.m. placed on IV amiodarone protocol Now back in sinus rhythm Oral Coumadin dosing has started at low dose  Objective: Vital signs in last 24 hours: Temp:  [97.5 F (36.4 C)-98.9 F (37.2 C)] 97.5 F (36.4 C) (05/07 0813) Pulse Rate:  [64-144] 64 (05/07 1046) Cardiac Rhythm:  [-] Atrial fibrillation (05/07 0924) Resp:  [9-24] 18 (05/07 0924) BP: (94-157)/(35-108) 99/56 mmHg (05/07 1046) SpO2:  [91 %-100 %] 96 % (05/07 0924) Arterial Line BP: (136-167)/(44-57) 136/44 mmHg (05/06 1300) Weight:  [238 lb 12.1 oz (108.3 kg)] 238 lb 12.1 oz (108.3 kg) (05/07 0500)  Hemodynamic parameters for last 24 hours:  stable  Intake/Output from previous day: 05/06 0701 - 05/07 0700 In: 2284.6 [P.O.:1800; I.V.:384.6; IV Piggyback:100] Out: 2120 [Urine:2110; Chest Tube:10] Intake/Output this shift: Total I/O In: 3 [I.V.:3] Out: -        Exam    General- alert and comfortable   Lungs- clear without rales, wheezes   Cor- regular rate and rhythm, no murmur , gallop   Abdomen- soft, non-tender   Extremities - warm, non-tender, minimal edema   Neuro- oriented, appropriate, no focal weakness   Lab Results:  Recent Labs  09/24/15 1645 09/25/15 0447  WBC 12.5* 10.7*  HGB 8.2* 7.7*  HCT 26.3* 24.6*  PLT 155 136*   BMET:  Recent Labs  09/24/15 0429 09/24/15 1643 09/24/15 1645 09/25/15 0447  NA 134* 136  --  131*  K 4.1 4.0  --  3.8  CL 102 96*  --  99*  CO2 23  --   --  27  GLUCOSE 110* 136*  --  97  BUN 11 15  --  18  CREATININE 0.78 0.90 1.04* 1.15*  CALCIUM 8.4*  --   --  8.3*    PT/INR:  Recent Labs  09/25/15 0447  LABPROT 14.5  INR 1.11   ABG     Component Value Date/Time   PHART 7.353 09/24/2015 0357   HCO3 22.6 09/24/2015 0357   TCO2 26 09/24/2015 1643   ACIDBASEDEF 3.0* 09/24/2015 0357   O2SAT 92.0 09/24/2015 0357   CBG (last 3)   Recent Labs  09/24/15 1932 09/25/15 0101 09/25/15 0811  GLUCAP 133* 102* 103*    Assessment/Plan: S/P Procedure(s) (LRB): CORONARY ARTERY BYPASS GRAFTING (CABG) TIMES 1 USING LEFT INTERNAL MAMMARY TO THE LAD (N/A)  CLIPPING OF ATRIAL APPENDAGE (N/A) TRANSESOPHAGEAL ECHOCARDIOGRAM (TEE) (N/A) MAZE (N/A) Continue IV amiodarone Continue to slowly load oral Coumadin Start oral Trinsicon for expected blood loss anemia Keep in ICU today   LOS: 5 days    Tharon Aquas Trigt III 09/25/2015

## 2015-09-25 NOTE — Progress Notes (Addendum)
Amiodarone Drug - Drug Interaction Consult Note  Recommendations: Monitor for statin induced myopathies, although rosuvastatin carries a relatively low risk. Patient is being started on warfarin tonight, will need to monitor INR closely as requirements may be reduced with concomitant amiodarone.  Amiodarone is metabolized by the cytochrome P450 system and therefore has the potential to cause many drug interactions. Amiodarone has an average plasma half-life of 50 days (range 20 to 100 days).   There is potential for drug interactions to occur several weeks or months after stopping treatment and the onset of drug interactions may be slow after initiating amiodarone.   [x]  Statins: Increased risk of myopathy. Simvastatin- restrict dose to 20mg  daily. Other statins: counsel patients to report any muscle pain or weakness immediately.  [x]  Anticoagulants: Amiodarone can increase anticoagulant effect. Consider warfarin dose reduction. Patients should be monitored closely and the dose of anticoagulant altered accordingly, remembering that amiodarone levels take several weeks to stabilize.  Thank You,  Erin Hearing PharmD., BCPS Clinical Pharmacist Pager [phone] 09/25/2015 10:29 AM

## 2015-09-25 NOTE — Progress Notes (Signed)
CT surgery p.m. Rounds  Patient converted to sinus rhythm shortly after starting IV amiodarone this morning We'll convert to by mouth amiodarone twice a day dosing this evening Patient walking hallway Complaining some heartburn-protonic's increased to twice a day, her home dose Atrially paced at 80 for  sinus bradycardia with IV amiodarone

## 2015-09-25 NOTE — Significant Event (Signed)
Patient in AFib RVR with rates up to 150. Dr. Prescott Gum on unit and made aware. Order given for 63mL IV Lopressor and to start Amiodarone protocol.   [name]

## 2015-09-26 ENCOUNTER — Inpatient Hospital Stay (HOSPITAL_COMMUNITY): Payer: Medicare Other

## 2015-09-26 ENCOUNTER — Encounter (HOSPITAL_COMMUNITY): Payer: Self-pay | Admitting: Cardiothoracic Surgery

## 2015-09-26 LAB — GLUCOSE, CAPILLARY
GLUCOSE-CAPILLARY: 101 mg/dL — AB (ref 65–99)
GLUCOSE-CAPILLARY: 105 mg/dL — AB (ref 65–99)
Glucose-Capillary: 114 mg/dL — ABNORMAL HIGH (ref 65–99)
Glucose-Capillary: 104 mg/dL — ABNORMAL HIGH (ref 65–99)
Glucose-Capillary: 114 mg/dL — ABNORMAL HIGH (ref 65–99)

## 2015-09-26 LAB — BASIC METABOLIC PANEL
Anion gap: 6 (ref 5–15)
BUN: 25 mg/dL — ABNORMAL HIGH (ref 6–20)
CO2: 27 mmol/L (ref 22–32)
Calcium: 8.3 mg/dL — ABNORMAL LOW (ref 8.9–10.3)
Chloride: 97 mmol/L — ABNORMAL LOW (ref 101–111)
Creatinine, Ser: 1.25 mg/dL — ABNORMAL HIGH (ref 0.44–1.00)
GFR calc Af Amer: 48 mL/min — ABNORMAL LOW (ref >60)
GFR calc non Af Amer: 42 mL/min — ABNORMAL LOW (ref >60)
Glucose, Bld: 117 mg/dL — ABNORMAL HIGH (ref 65–99)
Potassium: 4 mmol/L (ref 3.5–5.1)
Sodium: 130 mmol/L — ABNORMAL LOW (ref 135–145)

## 2015-09-26 LAB — CBC
HCT: 23 % — ABNORMAL LOW (ref 36.0–46.0)
Hemoglobin: 7.3 g/dL — ABNORMAL LOW (ref 12.0–15.0)
MCH: 28.9 pg (ref 26.0–34.0)
MCHC: 31.7 g/dL (ref 30.0–36.0)
MCV: 90.9 fL (ref 78.0–100.0)
Platelets: 144 10*3/uL — ABNORMAL LOW (ref 150–400)
RBC: 2.53 MIL/uL — ABNORMAL LOW (ref 3.87–5.11)
RDW: 15.7 % — ABNORMAL HIGH (ref 11.5–15.5)
WBC: 10 10*3/uL (ref 4.0–10.5)

## 2015-09-26 LAB — PROTIME-INR
INR: 1.06 (ref 0.00–1.49)
Prothrombin Time: 14 seconds (ref 11.6–15.2)

## 2015-09-26 MED ORDER — BISACODYL 10 MG RE SUPP: 10.0000 mg | Freq: Every day | RECTAL | Status: DC | PRN

## 2015-09-26 MED ORDER — BISACODYL 5 MG PO TBEC
10.0000 mg | DELAYED_RELEASE_TABLET | Freq: Every day | ORAL | Status: DC | PRN
  Administered 2015-09-28: 10 mg via ORAL
  Filled 2015-09-26: qty 2

## 2015-09-26 MED ORDER — SODIUM CHLORIDE 0.9 % IV SOLN: 250.0000 mL | INTRAVENOUS | Status: DC | PRN

## 2015-09-26 MED ORDER — ASPIRIN EC 81 MG PO TBEC
81.0000 mg | DELAYED_RELEASE_TABLET | Freq: Every day | ORAL | Status: DC
  Administered 2015-09-26 – 2015-09-30 (×5): 81 mg via ORAL
  Filled 2015-09-26 (×6): qty 1

## 2015-09-26 MED ORDER — DOCUSATE SODIUM 100 MG PO CAPS
200.0000 mg | ORAL_CAPSULE | Freq: Every day | ORAL | Status: DC
  Administered 2015-09-26 – 2015-09-30 (×4): 200 mg via ORAL
  Filled 2015-09-26 (×5): qty 2

## 2015-09-26 MED ORDER — PANTOPRAZOLE SODIUM 40 MG PO TBEC
40.0000 mg | DELAYED_RELEASE_TABLET | Freq: Every day | ORAL | Status: DC
  Administered 2015-09-26 – 2015-09-30 (×5): 40 mg via ORAL
  Filled 2015-09-26 (×5): qty 1

## 2015-09-26 MED ORDER — COUMADIN BOOK
Freq: Once | Status: AC
  Administered 2015-09-26: 19:00:00
  Filled 2015-09-26: qty 1

## 2015-09-26 MED ORDER — POTASSIUM CHLORIDE CRYS ER 20 MEQ PO TBCR
20.0000 meq | EXTENDED_RELEASE_TABLET | Freq: Every day | ORAL | Status: DC
  Administered 2015-09-26 – 2015-09-27 (×2): 20 meq via ORAL
  Filled 2015-09-26 (×2): qty 1

## 2015-09-26 MED ORDER — ONDANSETRON HCL 4 MG PO TABS: 4.0000 mg | ORAL_TABLET | Freq: Four times a day (QID) | ORAL | Status: DC | PRN

## 2015-09-26 MED ORDER — MOVING RIGHT ALONG BOOK
Freq: Once | Status: AC
  Administered 2015-09-26: 08:00:00
  Filled 2015-09-26: qty 1

## 2015-09-26 MED ORDER — SODIUM CHLORIDE 0.9% FLUSH
3.0000 mL | Freq: Two times a day (BID) | INTRAVENOUS | Status: DC
  Administered 2015-09-26 – 2015-09-30 (×7): 3 mL via INTRAVENOUS

## 2015-09-26 MED ORDER — FUROSEMIDE 40 MG PO TABS
40.0000 mg | ORAL_TABLET | Freq: Every day | ORAL | Status: DC
  Administered 2015-09-26: 40 mg via ORAL
  Filled 2015-09-26: qty 1

## 2015-09-26 MED ORDER — METOPROLOL TARTRATE 12.5 MG HALF TABLET
12.5000 mg | ORAL_TABLET | Freq: Two times a day (BID) | ORAL | Status: DC
  Administered 2015-09-26 – 2015-09-30 (×9): 12.5 mg via ORAL
  Filled 2015-09-26 (×10): qty 1

## 2015-09-26 MED ORDER — ONDANSETRON HCL 4 MG/2ML IJ SOLN
4.0000 mg | Freq: Four times a day (QID) | INTRAMUSCULAR | Status: DC | PRN
  Administered 2015-09-26: 4 mg via INTRAVENOUS
  Filled 2015-09-26: qty 2

## 2015-09-26 MED ORDER — TRAMADOL HCL 50 MG PO TABS
50.0000 mg | ORAL_TABLET | ORAL | Status: DC | PRN
  Administered 2015-09-26 – 2015-09-28 (×5): 100 mg via ORAL
  Administered 2015-09-29: 50 mg via ORAL
  Administered 2015-09-29 – 2015-09-30 (×5): 100 mg via ORAL
  Filled 2015-09-26 (×12): qty 2

## 2015-09-26 MED ORDER — INSULIN ASPART 100 UNIT/ML ~~LOC~~ SOLN: 0.0000 [IU] | Freq: Three times a day (TID) | SUBCUTANEOUS | Status: DC

## 2015-09-26 MED ORDER — OXYCODONE HCL 5 MG PO TABS
5.0000 mg | ORAL_TABLET | ORAL | Status: DC | PRN
  Administered 2015-09-26: 10 mg via ORAL
  Administered 2015-09-26: 5 mg via ORAL
  Filled 2015-09-26: qty 2
  Filled 2015-09-26: qty 1

## 2015-09-26 MED ORDER — SODIUM CHLORIDE 0.9% FLUSH: 3.0000 mL | INTRAVENOUS | Status: DC | PRN

## 2015-09-26 MED ORDER — WARFARIN SODIUM 5 MG PO TABS
5.0000 mg | ORAL_TABLET | Freq: Once | ORAL | Status: AC
  Administered 2015-09-26: 5 mg via ORAL
  Filled 2015-09-26: qty 1

## 2015-09-26 MED FILL — Sodium Bicarbonate IV Soln 8.4%: INTRAVENOUS | Qty: 50 | Status: AC

## 2015-09-26 MED FILL — Electrolyte-R (PH 7.4) Solution: INTRAVENOUS | Qty: 3000 | Status: AC

## 2015-09-26 MED FILL — Lidocaine HCl IV Inj 20 MG/ML: INTRAVENOUS | Qty: 5 | Status: AC

## 2015-09-26 MED FILL — Sodium Chloride IV Soln 0.9%: INTRAVENOUS | Qty: 2000 | Status: AC

## 2015-09-26 MED FILL — Mannitol IV Soln 20%: INTRAVENOUS | Qty: 500 | Status: AC

## 2015-09-26 MED FILL — Heparin Sodium (Porcine) Inj 1000 Unit/ML: INTRAMUSCULAR | Qty: 10 | Status: AC

## 2015-09-26 NOTE — Progress Notes (Signed)
Utilization review completed.  

## 2015-09-26 NOTE — Progress Notes (Signed)
09/26/2015 11:13 AM Report called to Silvio Clayman. Questions and concerns addressed. Pt. Transferred to 2w23 via wheelchair.  Serafina Topham, Arville Lime

## 2015-09-26 NOTE — Op Note (Signed)
[name], [name] NO.:  [id number]  MEDICAL RECORD NO.:  [id number]  LOCATION:  2S13C                        FACILITY:  [facility]  PHYSICIAN:  Lanelle Bal, MD    DATE OF BIRTH:  09-05-1940  DATE OF PROCEDURE:  09/23/2015 DATE OF DISCHARGE:                              OPERATIVE REPORT   PREOPERATIVE DIAGNOSIS:  Coronary occlusive disease and episodic atrial fibrillation.  POSTOPERATIVE DIAGNOSIS:  Coronary occlusive disease and episodic atrial fibrillation.  SURGICAL PROCEDURE:  Coronary artery bypass grafting x1 with the left internal mammary to the left anterior descending coronary artery and maze procedure with pulmonary vein isolation and placement of 40-mm atrial clip.  SURGEON:  Lanelle Bal, M.D.  FIRST ASSISTANT:  Lars Pinks, PA.  BRIEF HISTORY:  The patient is a 75 year old female with a previous history of episodic atrial fibrillation, but from her history this does not appear to occur frequently.  She was admitted with new onset of rapid atrial fibrillation in the 140s to 150s, and on review of EKG suggested ischemia.  Because of this, the patient underwent cardiac catheterization by Dr. Irish Lack which demonstrated significant ostial LAD lesion that on some views was 80% or more, it was not amenable to angioplasty.  In addition, she had a mid area of the right coronary artery that narrowed slightly but did not appear to be more than 50%. Prior wiring this __________ borderline significant.  The patient's atrium was 4.3 cm.  She had no atrial clot.  Overall ventricular function was preserved.  Several scenarios were considered with Cardiology including hybrid procedure to place a stent in the right coronary artery and off pump mammary to the LAD.  However, with the patient's atrial fibrillation and the marginally significant disease of the right coronary artery, I recommended to her that we proceed with coronary artery bypass  grafting with mammary to the left anterior descending coronary artery, placement of atrial clip and pulmonary vein isolation maze procedure.  The risks and options were discussed with the patient in detail and she was agreeable and signed informed consent.  DESCRIPTION OF PROCEDURE:  The patient underwent general endotracheal anesthesia without incidence.  The skin of the chest and legs was prepped with Betadine and draped in usual sterile manner. Preoperatively, an A-line and a Swan-Ganz had been placed.  An appropriate time-out was performed and we proceeded with median sternotomy.  Left internal mammary artery was dissected down as a pedicle graft.  The distal artery was divided and had good free flow. Pericardium was opened.  Overall, ventricular function appeared preserved.  A TEE probe was placed by Dr. Faylene Million and revealed a functioning mitral valve without significance and without regurgitation.  The atrial appendage was easily identified without any intra-atrial clot.  In the patient's previous history, there that been a suggestion of aortic stenosis by echocardiogram.  However, this was not consistent with the TEE which showed good function of the aortic valve without significant stenosis for the cardiac catheterization.  The patient was systemically heparinized.  ACT was performed.  The patient was somewhat resistant, although she had been on a heparin drip for transitioning off her Eliquis since admission.  Additional heparin was given.  The ascending aorta was cannulated.  The right atrium was cannulated.  The patient was placed on cardiopulmonary bypass 2.4 L/min/m2 with ACT over 400.  We first proceeded with encircling the pulmonary veins both right and left.  Aortic root vent cardioplegia needle was introduced into the ascending aorta.  The patient's body temperature was cooled to 32 degrees.  Aortic crossclamp was applied.  A 600 mL of cold blood potassium  cardioplegia was administered with diastolic arrest of the heart.  Myocardial septal temperature was monitored throughout the crossclamp.  We first turned our attention to the atrial appendage where I used AtriCure atrial ablation device to create a lesion set across the base of the atrial appendage.  The left pulmonary veins close to the origin were then ablated with 3 separate ablation times.  In a similar fashion, the right veins were performed.  A 40 mm atrial clip was then applied to the base of the atrial appendage.  Repeat ACTs dropped slightly in spite of additional heparin, so antithrombin 3 was administered.  The left anterior descending coronary artery was partially intramyocardial.  The vessel was diffusely diseased.  One probe passed distally and proximally.  Using a running 8-0 Prolene, left internal mammary artery was anastomosed to left anterior descending coronary artery.  With removal of the bulldog on the mammary artery, there was rise in myocardial septal temperature.  Aortic crossclamp was then removed. Initially, the patient required DOO pacing but ultimately returned to a sinus rhythm and was paced atrially to just increased rate with body temperature rewarmed to 37 degrees.  She was then ventilated and weaned from cardiopulmonary bypass without difficulty.  She remained hemodynamically stable, was decannulated in usual fashion.  Protamine sulfate was administered with the operative field hemostatic.  Left pleural tube and a Blake mediastinal drain were left in place. Pericardium was loosely reapproximated.  The sternum was closed with #6 stainless steel wire.  Fascia was closed with interrupted 0 Vicryl, running 3-0 Vicryl in subcutaneous tissue, 4-0 subcuticular stitches in the skin edges.  Dry dressings were applied.  Sponge and needle count was reported as correct at the completion of the procedure.  The patient tolerated the procedure without obvious  complications and transferred to the Surgical Intensive Care Unit for further postoperative care.  She did not require any packed red blood cells during the operative procedure.     Lanelle Bal, MD     EG/MEDQ  D:  09/26/2015  T:  09/26/2015  Job:  [id number]

## 2015-09-26 NOTE — Care Management Note (Signed)
Case Management Note  Patient Details  Name: [name] MRN: [id number] Date of Birth: July 28, 1940  Subjective/Objective: Pt admitted for A Fib and Chest Pain. Plan is for cardiac cath 09-20-15. Pt is from home and plans to return home.         Action/Plan: CM did provide pt with an agency list for [facility]. CM will check back with pt in regards to agency choice. CM will continue to monitor.   Expected Discharge Date:                  Expected Discharge Plan:  [facility]  In-House Referral:  NA  Discharge planning Services  CM Consult  Post Acute Care Choice:  Home Health Choice offered to:  Patient  DME Arranged:  N/A DME Agency:  NA  HH Arranged:  PT HH Agency:     Status of Service:  In process, will continue to follow  Medicare Important Message Given:    Date Medicare IM Given:    Medicare IM give by:    Date Additional Medicare IM Given:    Additional Medicare Important Message give by:     If discussed at [place] of Stay Meetings, dates discussed:    Additional Comments: 09/26/2015  CM spoke with pt; discharge plan is for pt to stay with daughter for 24 hour supervision post discharge  09/23/15 Pt transferred to ICU s/p CABG - pt remains intubated.  CM spoke with daughter Ena Dawley; pt lives alone and family actively working discharge plan that covers 24/7 for first week.  Maryclare Labrador, RN 09/26/2015, 11:35 AM

## 2015-09-26 NOTE — Progress Notes (Signed)
Patient ID: [name], female   DOB: 1940-07-10, 75 y.o.   MRN: [id number] [facility] DAILY ICU PROGRESS NOTE                   [address]            [place] [id number]          [phone]   3 Days Post-Op Procedure(s) (LRB): CORONARY ARTERY BYPASS GRAFTING (CABG) TIMES 1 USING LEFT INTERNAL MAMMARY TO THE LAD (N/A)  CLIPPING OF ATRIAL APPENDAGE (N/A) TRANSESOPHAGEAL ECHOCARDIOGRAM (TEE) (N/A) MAZE (N/A)  Total Length of Stay:  LOS: 6 days   Subjective: Alert, walked in unit this am, holding sinus   Objective: Vital signs in last 24 hours: Temp:  [97.5 F (36.4 C)-98.8 F (37.1 C)] 98 F (36.7 C) (05/08 0700) Pulse Rate:  [58-144] 74 (05/08 0700) Cardiac Rhythm:  [-] Normal sinus rhythm (05/08 0400) Resp:  [9-20] 14 (05/08 0700) BP: (91-125)/(35-56) 111/43 mmHg (05/08 0700) SpO2:  [93 %-99 %] 95 % (05/08 0700) Weight:  [239 lb 13.8 oz (108.8 kg)] 239 lb 13.8 oz (108.8 kg) (05/08 0500)  Filed Weights   09/24/15 0600 09/25/15 0500 09/26/15 0500  Weight: 238 lb 8.6 oz (108.2 kg) 238 lb 12.1 oz (108.3 kg) 239 lb 13.8 oz (108.8 kg)    Weight change: 1 lb 1.6 oz (0.5 kg)   Hemodynamic parameters for last 24 hours:    Intake/Output from previous day: 05/07 0701 - 05/08 0700 In: 1818.6 [P.O.:360; I.V.:1458.6] Out: 1555 [Urine:1555]  Intake/Output this shift:    Current Meds: Scheduled Meds: . acetaminophen  1,000 mg Oral Q6H   Or  . acetaminophen (TYLENOL) oral liquid 160 mg/5 mL  1,000 mg Per Tube Q6H  . amiodarone  200 mg Oral BID  . antiseptic oral rinse  7 mL Mouth Rinse BID  . aspirin EC  325 mg Oral Daily   Or  . aspirin  324 mg Per Tube Daily  . bisacodyl  10 mg Oral Daily   Or  . bisacodyl  10 mg Rectal Daily  . Chlorhexidine Gluconate Cloth  6 each Topical Daily  . docusate sodium  200 mg Oral Daily  . ferrous [id number] C-folic acid  1 capsule Oral TID PC  . furosemide  40 mg Intravenous Daily  . insulin aspart  0-24  Units Subcutaneous Q4H  . insulin detemir  10 Units Subcutaneous BID  . lisinopril  10 mg Oral Daily  . metoprolol tartrate  12.5 mg Oral BID   Or  . metoprolol tartrate  12.5 mg Per Tube BID  . mupirocin ointment   Nasal BID  . pantoprazole  40 mg Oral BID  . pramipexole  2 mg Oral QHS  . prednisoLONE acetate  1 drop Right Eye QID  . rosuvastatin  20 mg Oral q1800  . sodium chloride flush  3 mL Intravenous Q12H  . timolol  1 drop Right Eye QHS  . venlafaxine  37.5 mg Oral BID  . warfarin  2.5 mg Oral q1800  . Warfarin - Physician Dosing Inpatient   Does not apply q1800   Continuous Infusions: . sodium chloride    . lactated ringers Stopped (09/25/15 0700)   PRN Meds:.alum & mag hydroxide-simeth, metoprolol, ondansetron (ZOFRAN) IV, oxyCODONE, sodium chloride flush, traMADol  General appearance: alert, cooperative, appears older than stated age and no distress Neurologic: intact Heart: regular rate and rhythm, S1, S2 normal, no murmur, click, rub or  gallop Lungs: diminished breath sounds bibasilar Abdomen: soft, non-tender; bowel sounds normal; no masses,  no organomegaly Extremities: extremities normal, atraumatic, no cyanosis or edema and Homans sign is negative, no sign of DVT Wound: sternum stable  Lab Results: CBC:  Recent Labs  09/24/15 1645 09/25/15 0447 09/25/15 1755  WBC 12.5* 10.7*  --   HGB 8.2* 7.7* 9.2*  HCT 26.3* 24.6* 27.0*  PLT 155 136*  --    BMET:   Recent Labs  09/25/15 0447 09/25/15 1755 09/26/15 0438  NA 131* 132* 130*  K 3.8 4.0 4.0  CL 99* 94* 97*  CO2 27  --  27  GLUCOSE 97 124* 117*  BUN 18 25* 25*  CREATININE 1.15* 1.20* 1.25*  CALCIUM 8.3*  --  8.3*    PT/INR:   Recent Labs  09/26/15 0438  LABPROT 14.0  INR 1.06   Radiology: Dg Chest Port 1 View  09/26/2015  CLINICAL DATA:  Status post CABG 09/23/2015. EXAM: PORTABLE CHEST - 1 VIEW COMPARISON:  One-view chest x-ray 09/25/2015 FINDINGS: The heart is enlarged. The patient is  status post median sternotomy for CABG. Intra clipping is noted. Atherosclerotic calcifications are present at the aortic arch. The right IJ sheath remains in place. Bilateral pleural effusions persist, left greater than right. Associated atelectasis is noted. Interstitial edema and overall lung volumes are improved. IMPRESSION: 1. Improving aeration of both lungs and decreasing interstitial edema. 2. Persistent bilateral pleural effusions and basilar airspace disease, left greater than right. 3. Stable right IJ sheath. Electronically Signed   By: San Morelle M.D.   On: 09/26/2015 07:49     Assessment/Plan: S/P Procedure(s) (LRB): CORONARY ARTERY BYPASS GRAFTING (CABG) TIMES 1 USING LEFT INTERNAL MAMMARY TO THE LAD (N/A)  CLIPPING OF ATRIAL APPENDAGE (N/A) TRANSESOPHAGEAL ECHOCARDIOGRAM (TEE) (N/A) MAZE (N/A) Mobilize Diuresis Plan for transfer to step-down: see transfer orders D/c foley Coumadin for now , could go back on eliquis  after wires out    Grace Isaac 09/26/2015 8:04 AM

## 2015-09-26 NOTE — Progress Notes (Signed)
CARDIAC REHAB PHASE I   PRE:  Rate/Rhythm: 74 SR    BP: sitting 129/45    SaO2: 98 2 1/2L  Came to ambulate (second attempt) however pt in bed, feels nauseated and dizzy. Sts she can't walk today but that she will tomorrow. Discuss with RN who sts pt just received pain meds (oxy). Will f/u tomorrow. [place], ACSM 09/26/2015 2:22 PM

## 2015-09-26 NOTE — Progress Notes (Signed)
Patient Name: [name] Date of Encounter: 09/26/2015    SUBJECTIVE: No complaints, feeling better compared to yesterday. Pain is controlled.  TELEMETRY:  NSR Filed Vitals:   09/26/15 0500 09/26/15 0600 09/26/15 0700 09/26/15 0800  BP: 109/41 120/40 111/43 116/40  Pulse: 74 74 74 74  Temp:   98 F (36.7 C)   TempSrc:   Oral   Resp: 20 11 14 19   Height:      Weight: 239 lb 13.8 oz (108.8 kg)     SpO2: 93% 94% 95% 94%    Intake/Output Summary (Last 24 hours) at 09/26/15 0932 Last data filed at 09/26/15 0800  Gross per 24 hour  Intake 1818.61 ml  Output   1505 ml  Net 313.61 ml   LABS: Basic Metabolic Panel:  Recent Labs  09/24/15 0429  09/24/15 1645 09/25/15 0447 09/25/15 1755 09/26/15 0438  NA 134*  < >  --  131* 132* 130*  K 4.1  < >  --  3.8 4.0 4.0  CL 102  < >  --  99* 94* 97*  CO2 23  --   --  27  --  27  GLUCOSE 110*  < >  --  97 124* 117*  BUN 11  < >  --  18 25* 25*  CREATININE 0.78  < > 1.04* 1.15* 1.20* 1.25*  CALCIUM 8.4*  --   --  8.3*  --  8.3*  MG 2.1  --  1.8  --   --   --   < > = values in this interval not displayed. CBC:  Recent Labs  09/25/15 0447 09/25/15 1755 09/26/15 0438  WBC 10.7*  --  10.0  HGB 7.7* 9.2* 7.3*  HCT 24.6* 27.0* 23.0*  MCV 91.4  --  90.9  PLT 136*  --  144*   Cardiac Enzymes: No results for input(s): CKTOTAL, CKMB, CKMBINDEX, TROPONINI in the last 72 hours.  Physical Exam: Blood pressure 116/40, pulse 74, temperature 98 F (36.7 C), temperature source Oral, resp. rate 19, height 5\' 6"  (1.676 m), weight 239 lb 13.8 oz (108.8 kg), SpO2 94 %. Weight change: 1 lb 1.6 oz (0.5 kg)  Wt Readings from Last 3 Encounters:  09/26/15 239 lb 13.8 oz (108.8 kg)  09/11/15 225 lb (102.059 kg)  07/29/15 230 lb 3.2 oz (104.418 kg)    Physical Exam  Constitutional: She appears well-developed and well-nourished. No distress.  HENT:  Head: Normocephalic and atraumatic.  Nose: Nose normal.  Eyes: EOM are normal.  No scleral icterus.  Cardiovascular: Normal rate, regular rhythm and normal heart sounds.  Exam reveals no gallop and no friction rub.   No murmur heard. Sternotomy incision dressing clean and dry   Pulmonary/Chest: Effort normal and breath sounds normal. No respiratory distress. She has no wheezes. She has no rales. She exhibits no tenderness.  Abdominal: Soft. Bowel sounds are normal. She exhibits no distension and no mass. There is no tenderness. There is no rebound and no guarding.  Skin: Skin is warm and dry. No rash noted. She is not diaphoretic. No erythema. No pallor.    ASSESSMENT: 75 year old female presenting with a funny sensation that she gets when she has atrial fibrillation that was associated w/ SOB and leg weakness. Right heart cath + for LAD and RCA disease, now 3 days s/p CABG w/MAZE procedure.  PLAN: 1. Atrial fibrillation- s/p MAZE procedure, continue amiodarone 200mg  BID which  can be weaned to 100mg  BID - on coumadin, will transition back to her home eliquis once her wires are out for recent procedure 2. Coronary artery disease - w/ hx of stent to RCA in 2009. cath 5/2  revealed Ost LAD to Prox LAD lesion, 50% stenosed. Mid LAD lesion, 60% stenosed, and mid RCA 50% stenosis. No 3 days s/p CABG x 1 to left internal mammary to LAD w/ maze procedure.  - CXR today w/ improving interstitial edema, continue lasix 40mg  po - transfer to SDU today 3. HTN - lisinopril 10mg , lopressor 12.5mg  BID 4. Obstructive sleep apnea - non compliant w/ CPAP 5. GERD- protonix 40mg  BID 6. HLD- crestor 20mg  qd.  7.Hx of multinodular goiter s/p radioactive iodine tx in 2015. F/u w/ Dr. Buddy Duty outpatient for subclinical hypothyroidism noted this admission ( TSH 4.769 and FT4 WNL)   Signed, Julious Oka 09/26/2015, 9:32 AM  Attending Note:   The patient was seen and examined.  Agree with assessment and plan as noted above.  Changes made to the above note as needed.  Ms. Whitmer is doing well  after CABG / MAZE procedure No further atrial fib. Continue current meds.  Will likely transfer to 2W today    Ramond Dial., MD, [facility] 09/26/2015, 12:42 PM [address]  [place] Pager [phone]

## 2015-09-27 ENCOUNTER — Inpatient Hospital Stay (HOSPITAL_COMMUNITY): Payer: Medicare Other

## 2015-09-27 LAB — BASIC METABOLIC PANEL
Anion gap: 8 (ref 5–15)
BUN: 26 mg/dL — ABNORMAL HIGH (ref 6–20)
CO2: 24 mmol/L (ref 22–32)
Calcium: 8.6 mg/dL — ABNORMAL LOW (ref 8.9–10.3)
Chloride: 97 mmol/L — ABNORMAL LOW (ref 101–111)
Creatinine, Ser: 1.17 mg/dL — ABNORMAL HIGH (ref 0.44–1.00)
GFR calc Af Amer: 52 mL/min — ABNORMAL LOW (ref >60)
GFR calc non Af Amer: 45 mL/min — ABNORMAL LOW (ref >60)
Glucose, Bld: 120 mg/dL — ABNORMAL HIGH (ref 65–99)
Potassium: 4.8 mmol/L (ref 3.5–5.1)
Sodium: 129 mmol/L — ABNORMAL LOW (ref 135–145)

## 2015-09-27 LAB — GLUCOSE, CAPILLARY
GLUCOSE-CAPILLARY: 108 mg/dL — AB (ref 65–99)
GLUCOSE-CAPILLARY: 104 mg/dL — AB (ref 65–99)
Glucose-Capillary: 104 mg/dL — ABNORMAL HIGH (ref 65–99)
Glucose-Capillary: 115 mg/dL — ABNORMAL HIGH (ref 65–99)

## 2015-09-27 LAB — PROTIME-INR
INR: 1.19 (ref 0.00–1.49)
Prothrombin Time: 15.3 seconds — ABNORMAL HIGH (ref 11.6–15.2)

## 2015-09-27 LAB — CBC
HCT: 24.4 % — ABNORMAL LOW (ref 36.0–46.0)
Hemoglobin: 7.7 g/dL — ABNORMAL LOW (ref 12.0–15.0)
MCH: 29.1 pg (ref 26.0–34.0)
MCHC: 31.6 g/dL (ref 30.0–36.0)
MCV: 92.1 fL (ref 78.0–100.0)
Platelets: 203 10*3/uL (ref 150–400)
RBC: 2.65 MIL/uL — ABNORMAL LOW (ref 3.87–5.11)
RDW: 15.9 % — ABNORMAL HIGH (ref 11.5–15.5)
WBC: 11.1 10*3/uL — ABNORMAL HIGH (ref 4.0–10.5)

## 2015-09-27 MED ORDER — LACTULOSE 10 GM/15ML PO SOLN
20.0000 g | Freq: Once | ORAL | Status: AC
  Administered 2015-09-27: 20 g via ORAL
  Filled 2015-09-27: qty 30

## 2015-09-27 MED ORDER — OXYCODONE HCL 5 MG PO TABS
5.0000 mg | ORAL_TABLET | ORAL | Status: DC | PRN
  Administered 2015-09-28 – 2015-09-30 (×5): 5 mg via ORAL
  Filled 2015-09-27 (×6): qty 1

## 2015-09-27 MED ORDER — LISINOPRIL 10 MG PO TABS
20.0000 mg | ORAL_TABLET | Freq: Every day | ORAL | Status: DC
  Administered 2015-09-27 – 2015-09-28 (×2): 20 mg via ORAL
  Filled 2015-09-27 (×2): qty 2

## 2015-09-27 MED ORDER — FUROSEMIDE 40 MG PO TABS
40.0000 mg | ORAL_TABLET | Freq: Every day | ORAL | Status: DC
  Administered 2015-09-28: 40 mg via ORAL
  Filled 2015-09-27: qty 1

## 2015-09-27 MED ORDER — WARFARIN SODIUM 5 MG PO TABS
5.0000 mg | ORAL_TABLET | Freq: Once | ORAL | Status: AC
  Administered 2015-09-27: 5 mg via ORAL
  Filled 2015-09-27: qty 1

## 2015-09-27 MED ORDER — FUROSEMIDE 10 MG/ML IJ SOLN
40.0000 mg | Freq: Once | INTRAMUSCULAR | Status: AC
  Administered 2015-09-27: 40 mg via INTRAVENOUS

## 2015-09-27 NOTE — Discharge Instructions (Signed)
Coronary Artery Bypass Grafting, Care After °Refer to this sheet in the next few weeks. These instructions provide you with information on caring for yourself after your procedure. Your health care provider may also give you more specific instructions. Your treatment has been planned according to current medical practices, but problems sometimes occur. Call your health care provider if you have any problems or questions after your procedure. °WHAT TO EXPECT AFTER THE PROCEDURE °Recovery from surgery will be different for everyone. Some people feel well after 3 or 4 weeks, while for others it takes longer. After your procedure, it is typical to have the following: °· Nausea and a lack of appetite.   °· Constipation. °· Weakness and fatigue.   °· Depression or irritability.   °· Pain or discomfort at your incision site. °HOME CARE INSTRUCTIONS °· Take medicines only as directed by your health care provider. Do not stop taking medicines or start any new medicines without first checking with your health care provider. °· Take your pulse as directed by your health care provider. °· Perform deep breathing as directed by your health care provider. If you were given a device called an incentive spirometer, use it to practice deep breathing several times a day. Support your chest with a pillow or your arms when you take deep breaths or cough. °· Keep incision areas clean, dry, and protected. Remove or change any bandages (dressings) only as directed by your health care provider. You may have skin adhesive strips over the incision areas. Do not take the strips off. They will fall off on their own. °· Check incision areas daily for any swelling, redness, or drainage. °· If incisions were made in your legs, do the following: °¨ Avoid crossing your legs.   °¨ Avoid sitting for long periods of time. Change positions every 30 minutes.   °¨ Elevate your legs when you are sitting. °· Wear compression stockings as directed by your  health care provider. These stockings help keep blood clots from forming in your legs. °· Take showers once your health care provider approves. Until then, only take sponge baths. Pat incisions dry. Do not rub incisions with a washcloth or towel. Do not take baths, swim, or use a hot tub until your health care provider approves. °· Eat foods that are high in fiber, such as raw fruits and vegetables, whole grains, beans, and nuts. Meats should be lean cut. Avoid canned, processed, and fried foods. °· Drink enough fluid to keep your urine clear or pale yellow. °· Weigh yourself every day. This helps identify if you are retaining fluid that may make your heart and lungs work harder. °· Rest and limit activity as directed by your health care provider. You may be instructed to: °¨ Stop any activity at once if you have chest pain, shortness of breath, irregular heartbeats, or dizziness. Get help right away if you have any of these symptoms. °¨ Move around frequently for short periods or take short walks as directed by your health care provider. Increase your activities gradually. You may need physical therapy or cardiac rehabilitation to help strengthen your muscles and build your endurance. °¨ Avoid lifting, pushing, or pulling anything heavier than 10 lb (4.5 kg) for at least 6 weeks after surgery. °· Do not drive until your health care provider approves.  °· Ask your health care provider when you may return to work. °· Ask your health care provider when you may resume sexual activity. °· Keep all follow-up visits as directed by your health care   provider. This is important. SEEK MEDICAL CARE IF:  You have swelling, redness, increasing pain, or drainage at the site of an incision.  You have a fever.  You have swelling in your ankles or legs.  You have pain in your legs.   You gain 2 or more pounds (0.9 kg) a day.  You are nauseous or vomit.  You have diarrhea. SEEK IMMEDIATE MEDICAL CARE IF:  You have  chest pain that goes to your jaw or arms.  You have shortness of breath.   You have a fast or irregular heartbeat.   You notice a "clicking" in your breastbone (sternum) when you move.   You have numbness or weakness in your arms or legs.  You feel dizzy or light-headed.  MAKE SURE YOU:  Understand these instructions.  Will watch your condition.  Will get help right away if you are not doing well or get worse.   This information is not intended to replace advice given to you by your health care provider. Make sure you discuss any questions you have with your health care provider.   Document Released: 11/24/2004 Document Revised: 05/28/2014 Document Reviewed: 10/14/2012 Elsevier Interactive Patient Education 2016 [employer]  Atrial Fibrillation Atrial fibrillation is a type of irregular or rapid heartbeat (arrhythmia). In atrial fibrillation, the heart quivers continuously in a chaotic pattern. This occurs when parts of the heart receive disorganized signals that make the heart unable to pump blood normally. This can increase the risk for stroke, heart failure, and other heart-related conditions. There are different types of atrial fibrillation, including:  Paroxysmal atrial fibrillation. This type starts suddenly, and it usually stops on its own shortly after it starts.  Persistent atrial fibrillation. This type often lasts longer than a week. It may stop on its own or with treatment.  Long-lasting persistent atrial fibrillation. This type lasts longer than 12 months.  Permanent atrial fibrillation. This type does not go away. Talk with your health care provider to learn about the type of atrial fibrillation that you have. CAUSES This condition is caused by some heart-related conditions or procedures, including:  A heart attack.  Coronary artery disease.  Heart failure.  Heart valve conditions.  High blood pressure.  Inflammation of the sac that surrounds the  heart (pericarditis).  Heart surgery.  Certain heart rhythm disorders, such as Wolf-Parkinson-White syndrome. Other causes include:  Pneumonia.  Obstructive sleep apnea.  Blockage of an artery in the lungs (pulmonary embolism, or PE).  Lung cancer.  Chronic lung disease.  Thyroid problems, especially if the thyroid is overactive (hyperthyroidism).  Caffeine.  Excessive alcohol use or illegal drug use.  Use of some medicines, including certain decongestants and diet pills. Sometimes, the cause cannot be found. RISK FACTORS This condition is more likely to develop in:  People who are older in age.  People who smoke.  People who have diabetes mellitus.  People who are overweight (obese).  Athletes who exercise vigorously. SYMPTOMS Symptoms of this condition include:  A feeling that your heart is beating rapidly or irregularly.  A feeling of discomfort or pain in your chest.  Shortness of breath.  Sudden light-headedness or weakness.  Getting tired easily during exercise. In some cases, there are no symptoms. DIAGNOSIS Your health care provider may be able to detect atrial fibrillation when taking your pulse. If detected, this condition may be diagnosed with:  An electrocardiogram (ECG).  A Holter monitor test that records your heartbeat patterns over a 24-hour period.  Transthoracic echocardiogram (TTE) to evaluate how blood flows through your heart.  Transesophageal echocardiogram (TEE) to view more detailed images of your heart.  A stress test.  Imaging tests, such as a CT scan or chest X-ray.  Blood tests. TREATMENT The main goals of treatment are to prevent blood clots from forming and to keep your heart beating at a normal rate and rhythm. The type of treatment that you receive depends on many factors, such as your underlying medical conditions and how you feel when you are experiencing atrial fibrillation. This condition may be treated  with:  Medicine to slow down the heart rate, bring the heart's rhythm back to normal, or prevent clots from forming.  Electrical cardioversion. This is a procedure that resets your heart's rhythm by delivering a controlled, low-energy shock to the heart through your skin.  Different types of ablation, such as catheter ablation, catheter ablation with pacemaker, or surgical ablation. These procedures destroy the heart tissues that send abnormal signals. When the pacemaker is used, it is placed under your skin to help your heart beat in a regular rhythm. HOME CARE INSTRUCTIONS  Take over-the counter and prescription medicines only as told by your health care provider.  If your health care provider prescribed a blood-thinning medicine (anticoagulant), take it exactly as told. Taking too much blood-thinning medicine can cause bleeding. If you do not take enough blood-thinning medicine, you will not have the protection that you need against stroke and other problems.  Do not use tobacco products, including cigarettes, chewing tobacco, and e-cigarettes. If you need help quitting, ask your health care provider.  If you have obstructive sleep apnea, manage your condition as told by your health care provider.  Do not drink alcohol.  Do not drink beverages that contain caffeine, such as coffee, soda, and tea.  Maintain a healthy weight. Do not use diet pills unless your health care provider approves. Diet pills may make heart problems worse.  Follow diet instructions as told by your health care provider.  Exercise regularly as told by your health care provider.  Keep all follow-up visits as told by your health care provider. This is important. PREVENTION  Avoid drinking beverages that contain caffeine or alcohol.  Avoid certain medicines, especially medicines that are used for breathing problems.  Avoid certain herbs and herbal medicines, such as those that contain ephedra or ginseng.  Do  not use illegal drugs, such as cocaine and amphetamines.  Do not smoke.  Manage your high blood pressure. SEEK MEDICAL CARE IF:  You notice a change in the rate, rhythm, or strength of your heartbeat.  You are taking an anticoagulant and you notice increased bruising.  You tire more easily when you exercise or exert yourself. SEEK IMMEDIATE MEDICAL CARE IF:  You have chest pain, abdominal pain, sweating, or weakness.  You feel nauseous.  You notice blood in your vomit, bowel movement, or urine.  You have shortness of breath.  You suddenly have swollen feet and ankles.  You feel dizzy.  You have sudden weakness or numbness of the face, arm, or leg, especially on one side of the body.  You have trouble speaking, trouble understanding, or both (aphasia).  Your face or your eyelid droops on one side. These symptoms may represent a serious problem that is an emergency. Do not wait to see if the symptoms will go away. Get medical help right away. Call your local emergency services (911 in the [place] Do not drive yourself to the  hospital.   This information is not intended to replace advice given to you by your health care provider. Make sure you discuss any questions you have with your health care provider.   Document Released: 05/07/2005 Document Revised: 01/26/2015 Document Reviewed: 09/01/2014 Elsevier Interactive Patient Education 2016 [place] on my medicine - Coumadin   (Warfarin)  This medication education was reviewed with me or my healthcare representative as part of my discharge preparation.    Why was Coumadin prescribed for you? Coumadin was prescribed for you because you have a blood clot or a medical condition that can cause an increased risk of forming blood clots. Blood clots can cause serious health problems by blocking the flow of blood to the heart, lung, or brain. Coumadin can prevent harmful blood clots from forming. As a reminder your  indication for Coumadin is:   Stroke Prevention Because Of Atrial Fibrillation  What test will check on my response to Coumadin? While on Coumadin (warfarin) you will need to have an INR test regularly to ensure that your dose is keeping you in the desired range. The INR (international normalized ratio) number is calculated from the result of the laboratory test called prothrombin time (PT).  If an INR APPOINTMENT HAS NOT ALREADY BEEN MADE FOR YOU please schedule an appointment to have this lab work done by your health care provider within 7 days. Your INR goal is usually a number between:  2 to 3 or your provider may give you a more narrow range like 2-2.5.  Ask your health care provider during an office visit what your goal INR is.  What  do you need to  know  About  COUMADIN? Take Coumadin (warfarin) exactly as prescribed by your healthcare provider about the same time each day.  DO NOT stop taking without talking to the doctor who prescribed the medication.  Stopping without other blood clot prevention medication to take the place of Coumadin may increase your risk of developing a new clot or stroke.  Get refills before you run out.  What do you do if you miss a dose? If you miss a dose, take it as soon as you remember on the same day then continue your regularly scheduled regimen the next day.  Do not take two doses of Coumadin at the same time.  Important Safety Information A possible side effect of Coumadin (Warfarin) is an increased risk of bleeding. You should call your healthcare provider right away if you experience any of the following: ? Bleeding from an injury or your nose that does not stop. ? Unusual colored urine (red or dark brown) or unusual colored stools (red or black). ? Unusual bruising for unknown reasons. ? A serious fall or if you hit your head (even if there is no bleeding).  Some foods or medicines interact with Coumadin (warfarin) and might alter your response to  warfarin. To help avoid this: ? Eat a balanced diet, maintaining a consistent amount of Vitamin K. ? Notify your provider about major diet changes you plan to make. ? Avoid alcohol or limit your intake to 1 drink for women and 2 drinks for men per day. (1 drink is 5 oz. wine, 12 oz. beer, or 1.5 oz. liquor.)  Make sure that ANY health care provider who prescribes medication for you knows that you are taking Coumadin (warfarin).  Also make sure the healthcare provider who is monitoring your Coumadin knows when you have started a new medication  including herbals and non-prescription products.  Coumadin (Warfarin)  Major Drug Interactions  Increased Warfarin Effect Decreased Warfarin Effect  Alcohol (large quantities) Antibiotics (esp. Septra/Bactrim, Flagyl, Cipro) Amiodarone (Cordarone) Aspirin (ASA) Cimetidine (Tagamet) Megestrol (Megace) NSAIDs (ibuprofen, naproxen, etc.) Piroxicam (Feldene) Propafenone (Rythmol SR) Propranolol (Inderal) Isoniazid (INH) Posaconazole (Noxafil) Barbiturates (Phenobarbital) Carbamazepine (Tegretol) Chlordiazepoxide (Librium) Cholestyramine (Questran) Griseofulvin Oral Contraceptives Rifampin Sucralfate (Carafate) Vitamin K   Coumadin (Warfarin) Major Herbal Interactions  Increased Warfarin Effect Decreased Warfarin Effect  Garlic Ginseng Ginkgo biloba Coenzyme Q10 Green tea St. Johns wort    Coumadin (Warfarin) FOOD Interactions  Eat a consistent number of servings per week of foods HIGH in Vitamin K (1 serving =  cup)  Collards (cooked, or boiled & drained) Kale (cooked, or boiled & drained) Mustard greens (cooked, or boiled & drained) Parsley *serving size only =  cup Spinach (cooked, or boiled & drained) Swiss chard (cooked, or boiled & drained) Turnip greens (cooked, or boiled & drained)  Eat a consistent number of servings per week of foods MEDIUM-HIGH in Vitamin K (1 serving = 1 cup)  Asparagus (cooked, or boiled &  drained) Broccoli (cooked, boiled & drained, or raw & chopped) Brussel sprouts (cooked, or boiled & drained) *serving size only =  cup Lettuce, raw (green leaf, endive, romaine) Spinach, raw Turnip greens, raw & chopped   These websites have more information on Coumadin (warfarin):  FailFactory.se; VeganReport.com.au;

## 2015-09-27 NOTE — Progress Notes (Addendum)
CARDIAC REHAB PHASE I   PRE:  Rate/Rhythm: 75 SR  BP:  Sitting: 133/54        SaO2: 98 3L  MODE:  Ambulation: 88 ft   POST:  Rate/Rhythm: 90 SR  BP:  Sitting: 147/48         SaO2: 95 3L  Pt c/o feeling tired, weak. Pt agreeable to walk with encouragement. Pt stood on second attempt with moderate assistance. Pt ambulated 88 ft on 3L O2, rolling walker, gait belt, assist x2, fairly steady gait, tolerated fair. Pt c/o weakness in her arms and legs, fatigue, requested to return to room, brief standing rest x2. Pt assisted to bedside commode upon return to room. Pt to recliner after walk, call bell within reach. Encouraged additional ambulation today with nursing staff. Pt needs much encouragement. Will follow as x2.   IN:[id number]  Lenna Sciara, RN, BSN 09/27/2015 11:06 AM

## 2015-09-27 NOTE — Progress Notes (Addendum)
      [address]       [place] [address]             [phone]        4 Days Post-Op Procedure(s) (LRB): CORONARY ARTERY BYPASS GRAFTING (CABG) TIMES 1 USING LEFT INTERNAL MAMMARY TO THE LAD (N/A)  CLIPPING OF ATRIAL APPENDAGE (N/A) TRANSESOPHAGEAL ECHOCARDIOGRAM (TEE) (N/A) MAZE (N/A)  Subjective: Patient with incisional pain  Objective: Vital signs in last 24 hours: Temp:  [97.7 F (36.5 C)-98.6 F (37 C)] 98.3 F (36.8 C) (05/09 0512) Pulse Rate:  [74-87] 87 (05/09 0512) Cardiac Rhythm:  [-] Normal sinus rhythm (05/08 2015) Resp:  [9-19] 18 (05/09 0512) BP: (116-144)/(39-54) 144/49 mmHg (05/09 0512) SpO2:  [91 %-96 %] 93 % (05/09 0512) Weight:  [239 lb 12.8 oz (108.773 kg)] 239 lb 12.8 oz (108.773 kg) (05/09 0334)  Pre op weight 103 kg Current Weight  09/27/15 239 lb 12.8 oz (108.773 kg)       Intake/Output from previous day: 05/08 0701 - 05/09 0700 In: 240 [P.O.:240] Out: 1075 [Urine:1075]   Physical Exam:  Cardiovascular: RRR Pulmonary: Diminished at bases Abdomen: Soft, non tender, bowel sounds present. Extremities: Bilateral lower extremity edema. Wounds: Aquacel dressing removed. Sternal wound is clean and dry.  No erythema or signs of infection.  Lab Results: CBC: Recent Labs  09/26/15 0438 09/27/15 0310  WBC 10.0 11.1*  HGB 7.3* 7.7*  HCT 23.0* 24.4*  PLT 144* 203   BMET:  Recent Labs  09/26/15 0438 09/27/15 0310  NA 130* 129*  K 4.0 4.8  CL 97* 97*  CO2 27 24  GLUCOSE 117* 120*  BUN 25* 26*  CREATININE 1.25* 1.17*  CALCIUM 8.3* 8.6*    PT/INR:  Lab Results  Component Value Date   INR 1.19 09/27/2015   INR 1.06 09/26/2015   INR 1.11 09/25/2015   ABG:  INR: Will add last result for INR, ABG once components are confirmed Will add last 4 CBG results once components are confirmed  Assessment/Plan:  1. CV - History of a fib. Maintaining SR in the 80's. On Amiodarone 200 mg bid, Lisinopril 10 mg  daily and Lopressor 12.5 mg bid. Will increase Lisinopril for better BP control. She was started on Coumadin 05/07. INR 1.19 this am. 2.  Pulmonary - On 3 liters of oxygen via [place]. Wean to room air.Encourage incentive spirometer. CXR this am appears to show bilateral pleural effusions and atelectasis, no pneumothorax. Encourage incentive spirometer and flutter valve. 3. Volume Overload - On Lasix 40 mg daily. 4.  Acute blood loss anemia - H and H stable at 7.7 and 24.4. Continue Trinsicon. 5. Hyponatremia-sodium 129. Likely related to diuresis 6. Creatinine decreased from 1.25 to 1.17. 7. CBGs 114/105/104. On Insulin. Pre op HGA1C 5.8. She is likely pre diabetic. Stop accu checks and SS PRN.  8. Remove EPW 9. LOC constipation 10. Will discuss with Dr. Servando Snare pain control, as she is taking a fair amount of Oxy and it is making her somnolent.  ZIMMERMAN,DONIELLE MPA-C 09/27/2015,7:44 AM  Will decrease oxy and use ultram  On coumadin , currently in sinus I have seen and examined [name] and agree with the above assessment  and plan.  Grace Isaac MD [facility] [phone] Office [phone] 09/27/2015 11:20 AM

## 2015-09-27 NOTE — Progress Notes (Signed)
Patient Name: [name] Date of Encounter: 09/27/2015    SUBJECTIVE: Pt having pain from incision site. Denies SOB. She feels weak when getting out of bed.   TELEMETRY:  NSR Filed Vitals:   09/26/15 1251 09/26/15 2206 09/27/15 0334 09/27/15 0512  BP: 129/45 142/39  144/49  Pulse: 76 81  87  Temp: 98.1 F (36.7 C) 98.6 F (37 C)  98.3 F (36.8 C)  TempSrc: Oral Oral  Oral  Resp: 18 18  18   Height:      Weight:   239 lb 12.8 oz (108.773 kg)   SpO2: 92% 94%  93%    Intake/Output Summary (Last 24 hours) at 09/27/15 0737 Last data filed at 09/27/15 0334  Gross per 24 hour  Intake    240 ml  Output   1075 ml  Net   -835 ml   LABS: Basic Metabolic Panel:  Recent Labs  09/24/15 1645  09/26/15 0438 09/27/15 0310  NA  --   < > 130* 129*  K  --   < > 4.0 4.8  CL  --   < > 97* 97*  CO2  --   < > 27 24  GLUCOSE  --   < > 117* 120*  BUN  --   < > 25* 26*  CREATININE 1.04*  < > 1.25* 1.17*  CALCIUM  --   < > 8.3* 8.6*  MG 1.8  --   --   --   < > = values in this interval not displayed. CBC:  Recent Labs  09/26/15 0438 09/27/15 0310  WBC 10.0 11.1*  HGB 7.3* 7.7*  HCT 23.0* 24.4*  MCV 90.9 92.1  PLT 144* 203   Cardiac Enzymes: No results for input(s): CKTOTAL, CKMB, CKMBINDEX, TROPONINI in the last 72 hours.  Physical Exam: Blood pressure 144/49, pulse 87, temperature 98.3 F (36.8 C), temperature source Oral, resp. rate 18, height 5\' 6"  (1.676 m), weight 239 lb 12.8 oz (108.773 kg), SpO2 93 %. Weight change: -1 oz (-0.027 kg)  Wt Readings from Last 3 Encounters:  09/27/15 239 lb 12.8 oz (108.773 kg)  09/11/15 225 lb (102.059 kg)  07/29/15 230 lb 3.2 oz (104.418 kg)    Physical Exam  Constitutional: She appears well-developed and well-nourished.  HENT:  Head: Normocephalic and atraumatic.  Nose: Nose normal.  Eyes: EOM are normal. No scleral icterus.  Cardiovascular: Normal rate, regular rhythm and normal heart sounds.  Exam reveals no gallop  and no friction rub.   No murmur heard. Sternotomy incision dry and appears to be healing well   Pulmonary/Chest: Effort normal and breath sounds normal. No respiratory distress. She has no wheezes. She has no rales. She exhibits no tenderness.  Abdominal: Soft. Bowel sounds are normal. She exhibits no distension and no mass. There is no tenderness. There is no rebound and no guarding.  Skin: Skin is warm and dry. No rash noted. She is not diaphoretic. No erythema. No pallor.    ASSESSMENT: 75 year old female presenting with a funny sensation that she gets when she has atrial fibrillation that was associated w/ SOB and leg weakness. Right heart cath + for LAD and RCA disease, now 3 days s/p CABG w/MAZE procedure.  PLAN: 1. Atrial fibrillation- s/p MAZE procedure, continue amiodarone 200mg  BID which can be weaned to 100mg  BID - on coumadin, will transition back to her home eliquis once her wires are out for recent procedure  2. Coronary artery disease - w/ hx of stent to RCA in 2009. cath 5/2  revealed Ost LAD to Prox LAD lesion, 50% stenosed. Mid LAD lesion, 60% stenosed, and mid RCA 50% stenosis. Now 4 days s/p CABG x 1 to left internal mammary to LAD w/ maze procedure.  - CXR this morning + for small pleural effusion b/l, left greater than right, she is net +3 L balance since admission, currently on lasix 40mg  po once daily, can consider BID dosing.  3. HTN - lisinopril 20mg , lopressor 12.5mg  BID  4. Obstructive sleep apnea - non compliant w/ CPAP 5. GERD- protonix 40mg  BID 6. HLD- crestor 20mg  qd.  7.Subclinical hypothyroidism noted this admission ( TSH 4.769 and FT4 WNL) - f/u w/Dr. Buddy Duty outpatient 8. Anemia s/p recent sx-- hgb 7.7 up fro 7.3 yesterday. Continue trinscion.    Boykin Reaper 09/27/2015, 7:37 AM   Attending Note:   The patient was seen and examined.  Agree with assessment and plan as noted above.  Changes made to the above note as needed.  Pt is doing  well. Up and ambulating with assistance In NSR   Thayer Headings, Brooke Bonito., MD, [facility] 09/27/2015, 11:34 AM [address],  [place] Pager [phone]

## 2015-09-27 NOTE — Progress Notes (Signed)
Pt states she is weak Pt did flutter with RT and had a weak NPC

## 2015-09-27 NOTE — Care Management Important Message (Signed)
Important Message  Patient Details  Name: DARSHANA BROOMFIELD MRN: [id number] Date of Birth: 01-Nov-1940   Medicare Important Message Given:  Yes    Dawayne Patricia, RN 09/27/2015, 12:36 PM

## 2015-09-27 NOTE — Discharge Summary (Signed)
Physician Discharge Summary       [address]       [place] [address]             [phone]    Patient ID: [name] MRN: [id number] DOB/AGE: 01-30-1941 75 y.o.  Admit date: 09/19/2015 Discharge date: 09/30/2015  Admission Diagnoses: 1.Coronary artery disease 2. Atrial fibrillation ([place])  Active Diagnoses:  1. Hypertension 2. Hyperlipidemia 3. Tobacco abuse 4. GERD (gastroesophageal reflux disease) 5. OSA (obstructive sleep apnea) 6. Obesity (BMI 30-39.9) 7. Anxiety 8. Glaucoma 9. Osteoarthritis 10. ABL anemia  Procedure (s):  Intravascular Pressure Wire/FFR Study By Dr. Irish Lack on 09/20/2015:   Intravascular Ultrasound/IVUS   Left Heart Cath and Coronary Angiography    Conclusion     Ost LAD to Prox LAD lesion, 50% stenosed. Mid LAD lesion, 60% stenosed. the FFR across this disease was 0.77.  Mid RCA lesion, 50% stenosed. The FFR across this lesion was 0.79.  Mild aortic valve gradient.  Mildly elevated LVEDP  LAD disease is not ideal for PCI given the ostial nature of this lesion. There would have to be some stent into the short left main, which could compromise the circumflex. The RCA lesion is amenable to PCI. Will obtain surgical consultation per Dr. Tamala Julian to evaluate this option versus medical therapy, as her symptoms are controlled when her rhythm is controlled.    Coronary artery bypass grafting x1 with the left internal mammary to the left anterior descending coronary artery and maze procedure with pulmonary vein isolation and placement of 40-mm left atrial clip by Dr. Servando Snare on 09/23/2015.  History of Presenting Illness: This is a 75 year old female presenting with a funny sensation that she gets when she has atrial fibrillation that was associated w/ SOB and leg weakness. She called EMS 3am Monday and was brought to hospital . She was found to be in afib w/ HR 149 that self resolved. She is now in NSR and feels back at  her baseline. Unclear cause of patients symptomatic atrial fibrillation, likely multifactorial due to non compliance w/ CPAP, volume overload, recent viral illness over the weekend, and thyroid disease.  Patient has noted for months increasing dyspnea on exertion. She has been on elliquis for episodic a fib as well.  Cardiology reviewedall data including ST segment depression during atrial fibrillation, prior history of circumflex stenting 2009, strong family history of CAD, and trace positive troponin, and decided IT was decided to proceed with coronary angiography to exclude the possibility of an ischemic origin for atrial fibrillation.  Coronary artery disease , especially involving ostial LAD ( more then 80% ) , less significant disease of right. Dr. Servando Snare recommended to her that we at least proceed with LIMA to LAD , atrial clip with intermittent afib and pulmonary vein isolation . Stent of RCA could be done later as needed  The goals risks and alternatives of the planned surgical procedure CABG , atrial clip with intermittent afib and MAZE pulmonary vein isolation have been discussed with the patient in detail.  Pre operative carotid duplex showed no significant internal carotid artery stenosis bilateraly. Patient underwent a CABG x 1, LA clip, and maze with pulmonary vein isolation on 09/23/2015.  Brief Hospital Course:  The patient was extubated the evening of surgery without difficulty. She remained afebrile and hemodynamically stable. She was initially a paced. Gordy Councilman, a line, chest tubes, and foley were removed early in the post operative course. Lopressor was started and  titrated accordingly. She went into a fib with RVR (has a history of a fib) on post op day 2. She was started on Coumadin. Her PT and INR were monitored daily.  However, once pacing wires were removed she was transitioned back to home Eliquis. She converted to sinus rhythm. She was volume over loaded and diuresed.  She had ABL anemia. She did not require a post op transfusion. Her last H and H was 7.7 and 24.4. She was started on Trinsicon. She was weaned off the insulin drip. . The patient's HGA1C pre op was 5.8. She is likely pre diabetic. The patient was felt surgically stable for transfer from the ICU to PCTU for further convalescence on 09/26/2015. She continues to progress with cardiac rehab. She was requiring  3 liters of oxygen via [place]. We will attempt to wean her to room air. She has been tolerating a diet and has had a bowel movement. Epicardial pacing wires were removed on 05/09. Chest tube sutures will be removed prior to discharge. The patient is felt surgically stable for discharge today.   Latest Vital Signs: Blood pressure 122/42, pulse 62, temperature 97.8 F (36.6 C), temperature source Oral, resp. rate 18, height 5\' 6"  (1.676 m), weight 240 lb 11.2 oz (109.181 kg), SpO2 98 %.  Physical Exam: Cardiovascular: RRR Pulmonary: Diminished at bases Abdomen: Soft, non tender, bowel sounds present. Extremities: Bilateral lower extremity edema. Wounds: Aquacel dressing removed. Sternal wound is clean and dry. No erythema or signs of infection.  Discharge Condition:Stable and discharged to home  Recent laboratory studies:  Lab Results  Component Value Date   WBC 11.1* 09/27/2015   HGB 7.7* 09/27/2015   HCT 24.4* 09/27/2015   MCV 92.1 09/27/2015   PLT 203 09/27/2015   Lab Results  Component Value Date   NA 132* 09/30/2015   K 5.5* 09/30/2015   CL 93* 09/30/2015   CO2 30 09/30/2015   CREATININE 1.34* 09/30/2015   GLUCOSE 95 09/30/2015    Diagnostic Studies: Dg Chest 2 View  09/27/2015  CLINICAL DATA:  Chest soreness, shortness of breath, recent CABG EXAM: CHEST  2 VIEW COMPARISON:  Portable chest x-ray of 09/26/2015 FINDINGS: There are bilateral pleural effusions present left greater than right. Moderate cardiomegaly is stable and there appears to be minimal pulmonary vascular  congestion. Left atrial appendage exclusion device is noted. Median sternotomy sutures are present. IMPRESSION: Stable cardiomegaly with small pleural effusions left-greater-than-right. Electronically Signed   By: Ivar Drape M.D.   On: 09/27/2015 08:07   Dg Wrist Complete Left  09/11/2015  CLINICAL DATA:  Status post fall, with left lateral and medial wrist pain and swelling. Initial encounter. EXAM: LEFT WRIST - COMPLETE 3+ VIEW COMPARISON:  Left wrist radiographs from 01/10/2015 FINDINGS: There is no evidence of fracture or dislocation. There is mild chronic deformity of the distal radius. The carpal rows are intact, and demonstrate normal alignment. The joint spaces are preserved. A chronic ulnar styloid fragment is noted. No significant soft tissue abnormalities are seen. IMPRESSION: No evidence of acute fracture or dislocation. Electronically Signed   By: Garald Balding M.D.   On: 09/11/2015 21:37      Discharge Instructions    Amb Referral to Cardiac Rehabilitation    Complete by:  As directed   Diagnosis:  CABG  CABG X ___:  1          Discharge Medications:   Medication List    STOP taking these medications  CARTIA XT 120 MG 24 hr capsule  Generic drug:  diltiazem     hydrochlorothiazide 25 MG tablet  Commonly known as:  HYDRODIURIL     nitroGLYCERIN 0.4 MG SL tablet  Commonly known as:  NITROSTAT      TAKE these medications        acetaminophen 500 MG tablet  Commonly known as:  TYLENOL  Take 1,000 mg by mouth every 8 (eight) hours as needed for moderate pain.     amiodarone 200 MG tablet  Commonly known as:  PACERONE  Take 1 tablet (200 mg total) by mouth daily.     apixaban 5 MG Tabs tablet  Commonly known as:  ELIQUIS  Take 1 tablet (5 mg total) by mouth 2 (two) times daily.     aspirin 81 MG EC tablet  Take 1 tablet (81 mg total) by mouth daily.     ferrous sulfate 325 (65 FE) MG tablet  Take 325 mg by mouth every morning.     furosemide 40 MG  tablet  Commonly known as:  LASIX  Take 1 tablet (40 mg total) by mouth 2 (two) times daily. For 7 days, then decrease to once daily     lisinopril 20 MG tablet  Commonly known as:  PRINIVIL,ZESTRIL  Take 1 tablet (20 mg total) by mouth daily.     metoprolol tartrate 25 MG tablet  Commonly known as:  LOPRESSOR  Take 0.5 tablets (12.5 mg total) by mouth 2 (two) times daily.     nitrofurantoin 100 MG capsule  Commonly known as:  MACRODANTIN  Take 100 mg by mouth daily.     pantoprazole 40 MG tablet  Commonly known as:  PROTONIX  Take 40 mg by mouth daily.     polyethylene glycol packet  Commonly known as:  MIRALAX / GLYCOLAX  Take 17 g by mouth daily.     potassium chloride 10 MEQ tablet  Commonly known as:  K-DUR  TAKE 1 TABLET BY MOUTH DAILY     pramipexole 1 MG tablet  Commonly known as:  MIRAPEX  Take 1 mg by mouth at bedtime.     prednisoLONE acetate 1 % ophthalmic suspension  Commonly known as:  PRED FORTE  INSTILL 1 DROP TO OD QID. START AFTER SURGERY     rosuvastatin 20 MG tablet  Commonly known as:  CRESTOR  Take 1 tablet (20 mg total) by mouth daily at 6 PM.     timolol 0.5 % ophthalmic solution  Commonly known as:  TIMOPTIC  INSTILL 1 DROP OD QAM     traMADol 50 MG tablet  Commonly known as:  ULTRAM  Take 1-2 tablets (50-100 mg total) by mouth every 4 (four) hours as needed for moderate pain.     venlafaxine 75 MG tablet  Commonly known as:  EFFEXOR  Take 37.5 mg by mouth 2 (two) times daily.     VITAMIN B-12 PO  Take 1 tablet by mouth daily.       The patient has been discharged on:   1.Beta Blocker:  Yes [  x ]                              No   [   ]                              If  No, reason:  2.Ace Inhibitor/ARB: Yes [ x  ]                                     No  [    ]                                     If No, reason:  3.Statin:   Yes [ x  ]                  No  [   ]                  If No, reason:  4.Ecasa:  Yes  [  x ]                   No   [   ]                  If No, reason:  Follow Up Appointments: Follow-up Information    Follow up with Grace Isaac, MD On 11/03/2015.   Specialty:  Cardiothoracic Surgery   Why:  PA/LAT CXR to be taken (at [place] which is in the same building as Dr. Everrett Coombe office) on 11/03/2015 at;Appointment time is at    Contact information:   [place] [address] [phone]       Follow up with Angelena Form, PA-C On 10/13/2015.   Specialties:  Cardiology, Radiology   Why:  Appointment time is at 9:00 am   Contact information:   [address] [place] [address] [phone]       Follow up with Donnie Coffin, MD.   Specialty:  Family Medicine   Why:  Call for a follow up appointment regarding further surveillance of HGA1C 5.8 (pre diabetes)   Contact information:   [address] [place] [address] [phone]       Signed: Cinda Quest 09/30/2015, 12:00 PM

## 2015-09-27 NOTE — Progress Notes (Signed)
EPW d/c'd per order and per protocol. Tips intact. VSS. Pt and family educated on needs for q12m vitals and bedrest for 1 hour. Phone and call bell within reach.

## 2015-09-28 LAB — PROTIME-INR
INR: 1.5 — ABNORMAL HIGH (ref 0.00–1.49)
Prothrombin Time: 18.2 seconds — ABNORMAL HIGH (ref 11.6–15.2)

## 2015-09-28 LAB — GLUCOSE, CAPILLARY: GLUCOSE-CAPILLARY: 101 mg/dL — AB (ref 65–99)

## 2015-09-28 MED ORDER — WARFARIN SODIUM 5 MG PO TABS
5.0000 mg | ORAL_TABLET | Freq: Once | ORAL | Status: AC
  Administered 2015-09-28: 5 mg via ORAL
  Filled 2015-09-28: qty 1

## 2015-09-28 MED ORDER — POTASSIUM CHLORIDE CRYS ER 20 MEQ PO TBCR
20.0000 meq | EXTENDED_RELEASE_TABLET | Freq: Two times a day (BID) | ORAL | Status: AC
  Administered 2015-09-28 (×2): 20 meq via ORAL
  Filled 2015-09-28 (×2): qty 1

## 2015-09-28 MED ORDER — POLYETHYLENE GLYCOL 3350 17 G PO PACK
17.0000 g | PACK | Freq: Once | ORAL | Status: AC
  Administered 2015-09-28: 17 g via ORAL
  Filled 2015-09-28: qty 1

## 2015-09-28 MED ORDER — FUROSEMIDE 40 MG PO TABS
40.0000 mg | ORAL_TABLET | Freq: Two times a day (BID) | ORAL | Status: DC
  Administered 2015-09-28 – 2015-09-30 (×4): 40 mg via ORAL
  Filled 2015-09-28 (×4): qty 1

## 2015-09-28 NOTE — Progress Notes (Signed)
CARDIAC REHAB PHASE I   PRE:  Rate/Rhythm: 76 SR  BP:  Supine:   Sitting: 142/44  Standing:    SaO2: 95% 2L  MODE:  Ambulation: 150 ft   POST:  Rate/Rhythm: 89 SR  BP:  Supine:   Sitting: 143/50  Standing:    SaO2: 87% RA  HALL about 75 ft.. 93-95% 2L 1007-1042 Pt walked 150 ft starting out on RA but requiring to be back on 2L. Used rolling walker, gait belt and asst x2. Had to get a wheelchair for pt to sit to rest her knee. Pt c/o pain right side under breast that hurts about same as sternum. Put back on 2L. Pt doing better rocking and standing with asst.   Graylon Good, RN BSN  09/28/2015 10:37 AM

## 2015-09-28 NOTE — Evaluation (Signed)
Physical Therapy Evaluation Patient Details Name: [name] MRN: [id number] DOB: 1940/11/23 Today's Date: 09/28/2015   History of Present Illness  Patient is a 75 y.o. female presenting with chest pain and atrial fibrillation; s/p CABG x1 5/8.  Clinical Impression  Patient now s/p CABG and presents with decreased mobility secondary to new sternal precautions, pain, decreased endurance, and dyspnea on exertion requiring supplemental oxygen. Sp02 dropped to 88% on 2L/min 02.  Requires Mod A to get to EOB. Reviewed sternal precautions with pt. Tolerated gait training with Min guard assist for safety. Pt lives alone and is not safe to return home. Goals and discharge recommendation updated. Would benefit from ST SNF to maximize independence and mobility prior to return home.    Follow Up Recommendations SNF    Equipment Recommendations  Other (comment) (TBA)    Recommendations for Other Services       Precautions / Restrictions Precautions Precautions: Fall;Sternal Restrictions Weight Bearing Restrictions: No (sternal precautions)      Mobility  Bed Mobility Overal bed mobility: Needs Assistance Bed Mobility: Rolling;Sidelying to Sit Rolling: Min assist Sidelying to sit: Max assist;HOB elevated       General bed mobility comments: Assist to elevate trunk without using UEs, holding heart pillow.  Transfers Overall transfer level: Needs assistance Equipment used: 4-wheeled walker Transfers: Sit to/from Stand Sit to Stand: Mod assist         General transfer comment: Mod A to boost from EOB holding heart pillow and using body momentum, attempted x2 prior to standing. Transferred to chair post ambulation bout.  Ambulation/Gait Ambulation/Gait assistance: Min guard Ambulation Distance (Feet): 150 Feet Assistive device: 4-wheeled walker Gait Pattern/deviations: Step-through pattern;Decreased stride length;Trunk flexed Gait velocity: decreased Gait velocity  interpretation: Below normal speed for age/gender General Gait Details: very slow, guarded gait with 3 standing rest breaks. Sp02 88% on 2L/min 02. Reports chronic knee pain.  Stairs            Wheelchair Mobility    Modified Rankin (Stroke Patients Only)       Balance Overall balance assessment: Needs assistance Sitting-balance support: Feet supported;No upper extremity supported Sitting balance-Leahy Scale: Good     Standing balance support: During functional activity Standing balance-Leahy Scale: Fair                               Pertinent Vitals/Pain Pain Assessment: Faces Faces Pain Scale: Hurts little more Pain Location: right side Pain Descriptors / Indicators: Sore;Sharp Pain Intervention(s): Monitored during session;Repositioned    Home Living Family/patient expects to be discharged to:: Skilled nursing facility Living Arrangements: Alone   Type of Home: House Home Access: Level entry     Home Layout: One level Home Equipment: None Additional Comments: Lives alone with her cats per pt.     Prior Function Level of Independence: Independent         Comments: Pt does report at times she was unsteady but no falls as of yet.      Hand Dominance        Extremity/Trunk Assessment   Upper Extremity Assessment: Defer to OT evaluation           Lower Extremity Assessment: Generalized weakness      Cervical / Trunk Assessment: Normal  Communication   Communication: No difficulties  Cognition Arousal/Alertness: Awake/alert Behavior During Therapy: WFL for tasks assessed/performed Overall Cognitive Status: Within Functional Limits for tasks assessed  General Comments      Exercises        Assessment/Plan    PT Assessment Patient needs continued PT services  PT Diagnosis Generalized weakness;Difficulty walking   PT Problem List Decreased strength;Pain;Decreased balance;Decreased  mobility;Decreased knowledge of precautions;Decreased activity tolerance;Cardiopulmonary status limiting activity  PT Treatment Interventions Balance training;Gait training;Functional mobility training;Therapeutic activities;Therapeutic exercise;Patient/family education;DME instruction   PT Goals (Current goals can be found in the Care Plan section) Acute Rehab PT Goals Patient Stated Goal: to go to rehab and return to independence PT Goal Formulation: With patient Time For Goal Achievement: 10/12/15 Potential to Achieve Goals: Good    Frequency Min 3X/week   Barriers to discharge Decreased caregiver support      Co-evaluation               End of Session Equipment Utilized During Treatment: Gait belt Activity Tolerance: Patient tolerated treatment well Patient left: in chair;with call bell/phone within reach Nurse Communication: Mobility status         Time: [id number] PT Time Calculation (min) (ACUTE ONLY): 32 min   Charges:   PT Evaluation $PT Re-evaluation: 1 Procedure PT Treatments $Gait Training: 8-22 mins   PT G Codes:        [name] 09/28/2015, 4:08 PM Wray Kearns, [place], DPT [phone]

## 2015-09-28 NOTE — Progress Notes (Addendum)
      [address]       [place] [address]             [phone]        5 Days Post-Op Procedure(s) (LRB): CORONARY ARTERY BYPASS GRAFTING (CABG) TIMES 1 USING LEFT INTERNAL MAMMARY TO THE LAD (N/A)  CLIPPING OF ATRIAL APPENDAGE (N/A) TRANSESOPHAGEAL ECHOCARDIOGRAM (TEE) (N/A) MAZE (N/A)  Subjective: Patient with constipation  Objective: Vital signs in last 24 hours: Temp:  [98.4 F (36.9 C)-98.8 F (37.1 C)] 98.7 F (37.1 C) (05/10 0528) Pulse Rate:  [62-74] 62 (05/10 0528) Cardiac Rhythm:  [-] Normal sinus rhythm (05/09 1902) Resp:  [16-20] 20 (05/10 0528) BP: (102-143)/(44-59) 143/45 mmHg (05/10 0528) SpO2:  [95 %-96 %] 95 % (05/09 1927) Weight:  [238 lb (107.956 kg)] 238 lb (107.956 kg) (05/10 0500)  Pre op weight 103 kg Current Weight  09/28/15 238 lb (107.956 kg)      Intake/Output from previous day: 05/09 0701 - 05/10 0700 In: 220 [P.O.:220] Out: 1250 [Urine:1250]   Physical Exam:  Cardiovascular: RRR Pulmonary: Diminished at bases Abdomen: Soft, non tender, bowel sounds present. Extremities: Bilateral lower extremity edema. Wounds:  Sternal wound is clean and dry.  No erythema or signs of infection.  Lab Results: CBC:  Recent Labs  09/26/15 0438 09/27/15 0310  WBC 10.0 11.1*  HGB 7.3* 7.7*  HCT 23.0* 24.4*  PLT 144* 203   BMET:   Recent Labs  09/26/15 0438 09/27/15 0310  NA 130* 129*  K 4.0 4.8  CL 97* 97*  CO2 27 24  GLUCOSE 117* 120*  BUN 25* 26*  CREATININE 1.25* 1.17*  CALCIUM 8.3* 8.6*    PT/INR:  Lab Results  Component Value Date   INR 1.50* 09/28/2015   INR 1.19 09/27/2015   INR 1.06 09/26/2015   ABG:  INR: Will add last result for INR, ABG once components are confirmed Will add last 4 CBG results once components are confirmed  Assessment/Plan:  1. CV - History of a fib. Maintaining SR in the 70's. On Amiodarone 200 mg bid, Lisinopril 20 mg daily and Lopressor 12.5 mg bid.  She was started  on Coumadin 05/07. INR 1.5 this am. 2.  Pulmonary - On 3 liters of oxygen via [place]. Wean to room air.Encourage incentive spirometer.Encourage incentive spirometer and flutter valve. 3. Volume Overload - On Lasix 40 mg daily but will give twice today  4.  Acute blood loss anemia - H and H stable at 7.7 and 24.4. Continue Trinsicon. 8. LOC constipation 9. PT/OT consults as she will SNF when ready for discharge.   ZIMMERMAN,DONIELLE MPA-C 09/28/2015,8:13 AM   Will need snf placement, no help at home I have seen and examined [name] and agree with the above assessment  and plan.  Grace Isaac MD [place] [phone] Office [phone] 09/28/2015 5:56 PM

## 2015-09-28 NOTE — Progress Notes (Addendum)
Patient Name: [name] Date of Encounter: 09/28/2015    SUBJECTIVE: Pt having pain from incision site, and pain on deep inspiration. She is using her incentive spirometry. Feels about the same compared to yesterday.   TELEMETRY:  NSR Filed Vitals:   09/27/15 1838 09/27/15 1927 09/28/15 0500 09/28/15 0528  BP:  129/47  143/45  Pulse: 74 72  62  Temp:  98.8 F (37.1 C)  98.7 F (37.1 C)  TempSrc:  Oral  Oral  Resp: 16 18  20   Height:      Weight:   238 lb (107.956 kg)   SpO2: 96% 95%      Intake/Output Summary (Last 24 hours) at 09/28/15 0926 Last data filed at 09/28/15 0500  Gross per 24 hour  Intake    100 ml  Output   1250 ml  Net  -1150 ml   LABS: Basic Metabolic Panel:  Recent Labs  09/26/15 0438 09/27/15 0310  NA 130* 129*  K 4.0 4.8  CL 97* 97*  CO2 27 24  GLUCOSE 117* 120*  BUN 25* 26*  CREATININE 1.25* 1.17*  CALCIUM 8.3* 8.6*   CBC:  Recent Labs  09/26/15 0438 09/27/15 0310  WBC 10.0 11.1*  HGB 7.3* 7.7*  HCT 23.0* 24.4*  MCV 90.9 92.1  PLT 144* 203    Physical Exam: Blood pressure 143/45, pulse 62, temperature 98.7 F (37.1 C), temperature source Oral, resp. rate 20, height 5\' 6"  (1.676 m), weight 238 lb (107.956 kg), SpO2 95 %. Weight change: -1 lb 12.8 oz (-0.816 kg)  Wt Readings from Last 3 Encounters:  09/28/15 238 lb (107.956 kg)  09/11/15 225 lb (102.059 kg)  07/29/15 230 lb 3.2 oz (104.418 kg)    Physical Exam  Constitutional: She appears well-developed and well-nourished.  HENT:  Head: Normocephalic and atraumatic.  Nose: Nose normal.  Eyes: EOM are normal. No scleral icterus.  Cardiovascular: Normal rate, regular rhythm and normal heart sounds.  Exam reveals no gallop and no friction rub.   No murmur heard. Sternotomy incision dry and appears to be healing well   Pulmonary/Chest: Effort normal and breath sounds normal. No respiratory distress. She has no wheezes. She has no rales. She exhibits no tenderness.   Abdominal: Soft. Bowel sounds are normal. She exhibits no distension and no mass. There is no tenderness. There is no rebound and no guarding.  Skin: Skin is warm and dry. No rash noted. She is not diaphoretic. No erythema. No pallor.    ASSESSMENT: 75 year old female presenting with a funny sensation that she gets when she has atrial fibrillation that was associated w/ SOB and leg weakness. Right heart cath + for LAD and RCA disease, now s/p CABG w/MAZE procedure.  PLAN: 1. Atrial fibrillation- s/p MAZE procedure, continue amiodarone 200mg  BID  - on coumadin, INR 1.50, she is on eliquis at home, plan was to transition her to eliquis once her wires from CABG have been removed.  2. Coronary artery disease - w/ hx of stent to RCA in 2009. cath 5/2  revealed Ost LAD to Prox LAD lesion, 50% stenosed. Mid LAD lesion, 60% stenosed, and mid RCA 50% stenosis. Nows/p CABG x 1 to left internal mammary to LAD w/ maze procedure.  - on lasix 40mg  po once daily, getting 2 doses today.  3. HTN - lisinopril 20mg , lopressor 12.5mg  BID  4. Obstructive sleep apnea - non compliant w/ CPAP 5. GERD- protonix 40mg   BID 6. HLD- crestor 20mg  qd.  7.Subclinical hypothyroidism noted this admission ( TSH 4.769 and FT4 WNL) - f/u w/Dr. Buddy Duty outpatient 8. Anemia s/p recent sx-- trinscion.    Boykin Reaper 09/28/2015, 9:26 AM  Attending Note:   The patient was seen and examined.  Agree with assessment and plan as noted above.  Changes made to the above note as needed. Pt is doing well. Maintaining NSR   Continue with post operative care   Ramond Dial., MD, [facility] 09/28/2015, 12:25 PM [address],  [place] Pager [phone]

## 2015-09-29 LAB — BASIC METABOLIC PANEL
Anion gap: 9 (ref 5–15)
BUN: 34 mg/dL — ABNORMAL HIGH (ref 6–20)
CALCIUM: 8.7 mg/dL — AB (ref 8.9–10.3)
CHLORIDE: 95 mmol/L — AB (ref 101–111)
CO2: 28 mmol/L (ref 22–32)
CREATININE: 1.31 mg/dL — AB (ref 0.44–1.00)
GFR calc non Af Amer: 39 mL/min — ABNORMAL LOW (ref >60)
GFR, EST AFRICAN AMERICAN: 46 mL/min — AB (ref >60)
Glucose, Bld: 96 mg/dL (ref 65–99)
Potassium: 5.1 mmol/L (ref 3.5–5.1)
SODIUM: 132 mmol/L — AB (ref 135–145)

## 2015-09-29 LAB — PROTIME-INR
INR: 2.12 — ABNORMAL HIGH (ref 0.00–1.49)
Prothrombin Time: 23.6 seconds — ABNORMAL HIGH (ref 11.6–15.2)

## 2015-09-29 MED ORDER — WARFARIN SODIUM 2.5 MG PO TABS
2.5000 mg | ORAL_TABLET | Freq: Every day | ORAL | Status: AC
  Administered 2015-09-29: 2.5 mg via ORAL
  Filled 2015-09-29: qty 1

## 2015-09-29 MED ORDER — LISINOPRIL 10 MG PO TABS
20.0000 mg | ORAL_TABLET | Freq: Every day | ORAL | Status: DC
  Administered 2015-09-29 – 2015-09-30 (×2): 20 mg via ORAL
  Filled 2015-09-29 (×2): qty 2

## 2015-09-29 MED ORDER — MAGNESIUM CITRATE PO SOLN
1.0000 | Freq: Once | ORAL | Status: AC
  Administered 2015-09-29: 1 via ORAL
  Filled 2015-09-29: qty 296

## 2015-09-29 MED ORDER — ACETAMINOPHEN 500 MG PO TABS
500.0000 mg | ORAL_TABLET | Freq: Four times a day (QID) | ORAL | Status: DC | PRN
  Administered 2015-09-29 – 2015-09-30 (×4): 500 mg via ORAL
  Filled 2015-09-29 (×4): qty 1

## 2015-09-29 MED ORDER — LISINOPRIL 10 MG PO TABS: 10.0000 mg | ORAL_TABLET | Freq: Every day | ORAL | Status: DC

## 2015-09-29 NOTE — NC FL2 (Signed)
[place] LEVEL OF CARE SCREENING TOOL     IDENTIFICATION  Patient Name: [name] Birthdate: 1940-09-13 Sex: female Admission Date (Current Location): 09/19/2015  [facility] and [place] Number:  Herbalist and Address:  [facility], [place] [address] [place], [place] [address]      Provider Number: [id number]  Attending Physician Name and Address:  Grace Isaac, MD  Relative Name and Phone Number:       Current Level of Care: SNF Recommended Level of Care: [place] Prior Approval Number:    Date Approved/Denied:   PASRR Number: [id number]   Discharge Plan: Home    Current Diagnoses: Patient Active Problem List   Diagnosis Date Noted  . CAD (coronary artery disease) 09/23/2015  . Chest pain, rule out acute myocardial infarction   . New onset atrial fibrillation ([place])   . Atrial fibrillation with RVR ([place]) 12/19/2014  . Atrial fibrillation ([place]) 12/19/2014  . Multiple thyroid nodules 01/12/2014  . OSA (obstructive sleep apnea)   . Obesity (BMI 30-39.9)   . Sleep apnea 06/22/2013  . Fibromyalgia   . Dizziness 09/15/2010  . Orthostatic hypotension 09/15/2010  . Hematuria 09/15/2010  . Coronary artery disease   . Hypertension   . Hyperlipidemia   . Fatigue   . History of myocardial infarction     Orientation RESPIRATION BLADDER Height & Weight     Self, Time, Situation, Place  O2 (2 L/min) Continent Weight: 240 lb 6.4 oz (109.045 kg) Height:  5\' 6"  (167.6 cm)  BEHAVIORAL SYMPTOMS/MOOD NEUROLOGICAL BOWEL NUTRITION STATUS   (None)  (none) Continent Diet (Heart Healthy/ Carb modified)  AMBULATORY STATUS COMMUNICATION OF NEEDS Skin   Extensive Assist Verbally Surgical wounds (Incision Closed chest)                       Personal Care Assistance Level of Assistance  Bathing, Feeding, Dressing Bathing Assistance: Limited assistance Feeding assistance: Independent Dressing Assistance:  Limited assistance     Functional Limitations Info  Sight, Hearing, Speech Sight Info: Adequate Hearing Info: Adequate Speech Info: Adequate    SPECIAL CARE FACTORS FREQUENCY  PT (By licensed PT), OT (By licensed OT)     PT Frequency: 5/ week OT Frequency: 5/ week            Contractures Contractures Info: Not present    Additional Factors Info  Code Status, Allergies Code Status Info: FULL Allergies Info: Sulfamethoxazole-trimethoprim, Cortisone           Current Medications (09/29/2015):  This is the current hospital active medication list Current Facility-Administered Medications  Medication Dose Route Frequency Provider Last Rate Last Dose  . 0.9 %  sodium chloride infusion  250 mL Intravenous PRN Grace Isaac, MD      . alum & mag hydroxide-simeth (MAALOX/MYLANTA) 200-200-20 MG/5ML suspension 15 mL  15 mL Oral PRN Ivin Poot, MD      . amiodarone (PACERONE) tablet 200 mg  200 mg Oral BID Ivin Poot, MD   200 mg at 09/29/15 1025  . antiseptic oral rinse (CPC / CETYLPYRIDINIUM CHLORIDE 0.05%) solution 7 mL  7 mL Mouth Rinse BID Grace Isaac, MD   7 mL at 09/29/15 1000  . aspirin EC tablet 81 mg  81 mg Oral Daily Grace Isaac, MD   81 mg at 09/29/15 1025  . bisacodyl (DULCOLAX) EC tablet 10 mg  10 mg Oral  Daily PRN Grace Isaac, MD   10 mg at 09/28/15 1608   Or  . bisacodyl (DULCOLAX) suppository 10 mg  10 mg Rectal Daily PRN Grace Isaac, MD      . docusate sodium (COLACE) capsule 200 mg  200 mg Oral Daily Grace Isaac, MD   200 mg at 09/29/15 1026  . ferrous [id number] C-folic acid (TRINSICON / FOLTRIN) capsule 1 capsule  1 capsule Oral TID PC Ivin Poot, MD   1 capsule at 09/29/15 1306  . furosemide (LASIX) tablet 40 mg  40 mg Oral BID Nani Skillern, PA-C   40 mg at 09/29/15 [id number]  . lisinopril (PRINIVIL,ZESTRIL) tablet 20 mg  20 mg Oral Daily Donielle Liston Alba, PA-C   20 mg at 09/29/15 1026  .  metoprolol tartrate (LOPRESSOR) tablet 12.5 mg  12.5 mg Oral BID Grace Isaac, MD   12.5 mg at 09/29/15 1025  . ondansetron (ZOFRAN) tablet 4 mg  4 mg Oral Q6H PRN Grace Isaac, MD       Or  . ondansetron [facility]) injection 4 mg  4 mg Intravenous Q6H PRN Grace Isaac, MD   4 mg at 09/26/15 1849  . oxyCODONE (Oxy IR/ROXICODONE) immediate release tablet 5 mg  5 mg Oral Q4H PRN Nani Skillern, PA-C   5 mg at 09/29/15 1318  . pantoprazole (PROTONIX) EC tablet 40 mg  40 mg Oral QAC breakfast Grace Isaac, MD   40 mg at 09/29/15 [id number]  . pramipexole (MIRAPEX) tablet 2 mg  2 mg Oral QHS Ivin Poot, MD   2 mg at 09/28/15 2215  . prednisoLONE acetate (PRED FORTE) 1 % ophthalmic suspension 1 drop  1 drop Right Eye QID Alphia Moh, MD   1 drop at 09/29/15 1306  . rosuvastatin (CRESTOR) tablet 20 mg  20 mg Oral q1800 Norman Herrlich, MD   20 mg at 09/28/15 1822  . sodium chloride flush (NS) 0.9 % injection 3 mL  3 mL Intravenous Q12H Grace Isaac, MD   3 mL at 09/28/15 2200  . sodium chloride flush (NS) 0.9 % injection 3 mL  3 mL Intravenous PRN Grace Isaac, MD      . timolol (TIMOPTIC) 0.5 % ophthalmic solution 1 drop  1 drop Right Eye QHS Alphia Moh, MD   1 drop at 09/28/15 2216  . traMADol (ULTRAM) tablet 50-100 mg  50-100 mg Oral Q4H PRN Grace Isaac, MD   50 mg at 09/29/15 1148  . venlafaxine (EFFEXOR) tablet 37.5 mg  37.5 mg Oral BID Norman Herrlich, MD   37.5 mg at 09/29/15 [id number]  . warfarin (COUMADIN) tablet 2.5 mg  2.5 mg Oral q1800 Nani Skillern, PA-C      . Warfarin - Physician Dosing Inpatient   Does not apply [place], [facility]         Discharge Medications: Please see discharge summary for a list of discharge medications.  Relevant Imaging Results:  Relevant Lab Results:   Additional Information [id number]  Samule Dry, LCSW

## 2015-09-29 NOTE — Progress Notes (Signed)
CARDIAC REHAB PHASE I   PRE:  Rate/Rhythm: 69 SR  BP:  Sitting: 118/79        SaO2: 94 1L, 93 RA  MODE:  Ambulation: 190 ft   POST:  Rate/Rhythm: 89 SR  BP:  Sitting: 154/44         SaO2: 92 RA  Pt up to bedside commode, c/o R side burning/stabbing pain. Pt ambulated 190 ft on RA, rolling walker, assist x2, slow, steady gait, tolerated fair. Pt increased distance today with much encouragement, sats 88-95% on RA (mostly 90-92% on RA), standing rest x4. Encouraged IS, coughing and deep breathing, additional ambulation. Pt to recliner after walk, feet elevated, call bell within reach, on RA, RN aware. Will follow as x1.    [id number] Lenna Sciara, RN, BSN 09/29/2015 11:10 AM

## 2015-09-29 NOTE — Evaluation (Signed)
Occupational Therapy Evaluation Patient Details Name: [name] MRN: [id number] DOB: 1941/02/17 Today's Date: 09/29/2015    History of Present Illness Patient is a 75 y.o. female presenting with chest pain and atrial fibrillation; s/p CABG x1 5/8.   Clinical Impression   Pt was admitted for the above.  At baseline, she is independent and lives alone. She is limited with adls due to both pain and sternal precautions. She will benefit from continued OT to increase safety and independence with adls. Goals in acute are for supervision to min A levels.    Follow Up Recommendations  SNF    Equipment Recommendations  3 in 1 bedside comode    Recommendations for Other Services       Precautions / Restrictions Precautions Precautions: Fall;Sternal Restrictions Other Position/Activity Restrictions: sternal precautions      Mobility Bed Mobility     Rolling: Min assist Sidelying to sit: Max assist;HOB elevated     Sit to sidelying: Max assist;+2 for physical assistance General bed mobility comments: Assist to elevate trunk without using UEs, holding heart pillow.  Transfers   Equipment used: Conservation officer, nature (2 wheeled) Transfers: Risk manager;Sit to/from Stand Sit to Stand: Mod assist Stand pivot transfers: Min assist       General transfer comment: Mod A to boost from EOB holding heart pillow and using body momentum, attempted x2 prior to standing. Transferred to chair post ambulation bout.    Balance     Sitting balance-Leahy Scale: Good     Standing balance support: During functional activity Standing balance-Leahy Scale: Fair (use of RW)                              ADL Overall ADL's : Needs assistance/impaired     Grooming: Wash/dry hands;Set up;Sitting   Upper Body Bathing: Supervision/ safety;Standing   Lower Body Bathing: Maximal assistance;Sit to/from stand   Upper Body Dressing : Moderate assistance;Sitting   Lower Body  Dressing: Maximal assistance;Sit to/from stand   Toilet Transfer: Minimal assistance;Stand-pivot;BSC;RW   Toileting- Clothing Manipulation and Hygiene: Moderate assistance;Sit to/from stand         General ADL Comments: pt used bathroom and walked around circle with min guard (for one of her daily walks).  pain increases a lot whenever she moves     Vision     Perception     Praxis      Pertinent Vitals/Pain Pain Assessment: Faces Faces Pain Scale: Hurts little more Pain Location: R side chest Pain Descriptors / Indicators: Constant Pain Intervention(s): Limited activity within patient's tolerance;Monitored during session;Premedicated before session;Repositioned     Hand Dominance     Extremity/Trunk Assessment Upper Extremity Assessment Upper Extremity Assessment:  (MMT deferred due to CABG)           Communication Communication Communication: No difficulties   Cognition Arousal/Alertness: Awake/alert Behavior During Therapy: WFL for tasks assessed/performed Overall Cognitive Status: Within Functional Limits for tasks assessed                     General Comments       Exercises       Shoulder Instructions      Home Living Family/patient expects to be discharged to:: Skilled nursing facility Living Arrangements: Alone  Prior Functioning/Environment Level of Independence: Independent             OT Diagnosis: Acute pain;Generalized weakness   OT Problem List: Decreased strength;Decreased activity tolerance;Pain;Decreased knowledge of use of DME or AE;Cardiopulmonary status limiting activity   OT Treatment/Interventions: Self-care/ADL training;DME and/or AE instruction;Patient/family education    OT Goals(Current goals can be found in the care plan section) Acute Rehab OT Goals Patient Stated Goal: to go to rehab and return to independence OT Goal Formulation: With patient Time For  Goal Achievement: 10/06/15 Potential to Achieve Goals: Good ADL Goals Pt Will Perform Grooming: with supervision;standing Pt Will Perform Lower Body Bathing: with min assist;with adaptive equipment;sit to/from stand Pt Will Perform Upper Body Dressing: with set-up;with supervision;sitting Pt Will Perform Lower Body Dressing: with min assist;with adaptive equipment;sit to/from stand (pants with reacher) Pt Will Transfer to Toilet: with supervision;ambulating;bedside commode Additional ADL Goal #1: pt will perform bed mobility withi min A following sternal precautions  OT Frequency: Min 2X/week   Barriers to D/C:            Co-evaluation              End of Session    Activity Tolerance: Patient limited by pain Patient left:     Time: [id number] OT Time Calculation (min): 40 min Charges:  OT General Charges $OT Visit: 1 Procedure OT Evaluation $OT Eval Moderate Complexity: 1 Procedure OT Treatments $Self Care/Home Management : 8-22 mins G-Codes:    Jocabed Cheese 10/26/2015, 4:01 PM  Lesle Chris, OTR/L [phone] 10/26/15

## 2015-09-29 NOTE — Progress Notes (Signed)
Patient Name: [name] Date of Encounter: 09/29/2015    SUBJECTIVE: Pt having pain from incision site, and pain on deep inspiration. She is using her incentive spirometry. Feels about the same compared to yesterday.   TELEMETRY:  NSR Filed Vitals:   09/28/15 1336 09/28/15 2209 09/29/15 0120 09/29/15 0600  BP: 142/45 143/56 110/53   Pulse: 69 72 67   Temp: 98.3 F (36.8 C) 98.5 F (36.9 C) 98.3 F (36.8 C)   TempSrc: Oral Oral Oral   Resp: 18 20 18    Height:      Weight:    240 lb 6.4 oz (109.045 kg)  SpO2: 92% 97% 93%     Intake/Output Summary (Last 24 hours) at 09/29/15 0945 Last data filed at 09/28/15 2200  Gross per 24 hour  Intake    240 ml  Output    890 ml  Net   -650 ml   LABS: Basic Metabolic Panel:  Recent Labs  09/27/15 0310 09/29/15 0432  NA 129* 132*  K 4.8 5.1  CL 97* 95*  CO2 24 28  GLUCOSE 120* 96  BUN 26* 34*  CREATININE 1.17* 1.31*  CALCIUM 8.6* 8.7*   CBC:  Recent Labs  09/27/15 0310  WBC 11.1*  HGB 7.7*  HCT 24.4*  MCV 92.1  PLT 203    Physical Exam: Blood pressure 110/53, pulse 67, temperature 98.3 F (36.8 C), temperature source Oral, resp. rate 18, height 5\' 6"  (1.676 m), weight 240 lb 6.4 oz (109.045 kg), SpO2 93 %. Weight change: 2 lb 6.4 oz (1.089 kg)  Wt Readings from Last 3 Encounters:  09/29/15 240 lb 6.4 oz (109.045 kg)  09/11/15 225 lb (102.059 kg)  07/29/15 230 lb 3.2 oz (104.418 kg)    Physical Exam  Constitutional: She appears well-developed and well-nourished.  HENT:  Head: Normocephalic and atraumatic.  Nose: Nose normal.  Eyes: EOM are normal. No scleral icterus.  Cardiovascular: Normal rate, regular rhythm and normal heart sounds.  Exam reveals no gallop and no friction rub.   No murmur heard. Sternotomy incision dry and appears to be healing well   Pulmonary/Chest: Effort normal and breath sounds normal. No respiratory distress. She has no wheezes. She has no rales. She exhibits no  tenderness.  Abdominal: Soft. Bowel sounds are normal. She exhibits no distension and no mass. There is no tenderness. There is no rebound and no guarding.  Skin: Skin is warm and dry. No rash noted. She is not diaphoretic. No erythema. No pallor.    ASSESSMENT: 75 year old female presenting with a funny sensation that she gets when she has atrial fibrillation that was associated w/ SOB and leg weakness. Right heart cath + for LAD and RCA disease, now s/p CABG w/MAZE procedure.  PLAN: 1. Atrial fibrillation- s/p MAZE procedure, continue amiodarone 200mg  BID  I would decrease the amio to 200 QD when she goes to the SNF.  - on coumadin, INR 1.50, she is on eliquis at home, plan was to transition her to eliquis once her wires from CABG have been removed.  2. Coronary artery disease - w/ hx of stent to RCA in 2009. cath 5/2  revealed Ost LAD to Prox LAD lesion, 50% stenosed. Mid LAD lesion, 60% stenosed, and mid RCA 50% stenosis. Nows/p CABG x 1 to left internal mammary to LAD w/ maze procedure.  - on lasix 40mg  po once daily, getting 2 doses today.  3. HTN -  lisinopril 20mg , lopressor 12.5mg  BID  4. Obstructive sleep apnea - non compliant w/ CPAP 5. GERD- protonix 40mg  BID 6. HLD- crestor 20mg  qd.  7.Subclinical hypothyroidism noted this admission ( TSH 4.769 and FT4 WNL) - f/u w/Dr. Buddy Duty outpatient 8. Anemia s/p recent sx-- trinscion.      Zaahir Pickney, Wonda Cheng, MD  09/29/2015 9:46 AM    [place],  [place], [place]  [address] Pager [phone] Phone: [phone]; Fax: [phone]   [facility]  [address] [place], [place]  [address] [phone]   Fax [phone]

## 2015-09-29 NOTE — Progress Notes (Addendum)
      [address]       [place] [address]             [phone]        6 Days Post-Op Procedure(s) (LRB): CORONARY ARTERY BYPASS GRAFTING (CABG) TIMES 1 USING LEFT INTERNAL MAMMARY TO THE LAD (N/A)  CLIPPING OF ATRIAL APPENDAGE (N/A) TRANSESOPHAGEAL ECHOCARDIOGRAM (TEE) (N/A) MAZE (N/A)  Subjective: Patient still with no bowel movement despite 2 laxatives.  Objective: Vital signs in last 24 hours: Temp:  [98.3 F (36.8 C)-98.5 F (36.9 C)] 98.3 F (36.8 C) (05/11 0120) Pulse Rate:  [67-72] 67 (05/11 0120) Cardiac Rhythm:  [-] Normal sinus rhythm (05/10 1900) Resp:  [18-20] 18 (05/11 0120) BP: (110-143)/(45-56) 110/53 mmHg (05/11 0120) SpO2:  [92 %-97 %] 93 % (05/11 0120) Weight:  [240 lb 6.4 oz (109.045 kg)] 240 lb 6.4 oz (109.045 kg) (05/11 0600)  Pre op weight 103 kg Current Weight  09/29/15 240 lb 6.4 oz (109.045 kg)      Intake/Output from previous day: 05/10 0701 - 05/11 0700 In: 480 [P.O.:480] Out: 1190 [Urine:1190]   Physical Exam:  Cardiovascular: RRR Pulmonary: Diminished at bases Abdomen: Soft, non tender, bowel sounds present. Extremities: Bilateral lower extremity edema. Wounds:  Sternal wound is clean and dry.  No erythema or signs of infection.  Lab Results: CBC:  Recent Labs  09/27/15 0310  WBC 11.1*  HGB 7.7*  HCT 24.4*  PLT 203   BMET:   Recent Labs  09/27/15 0310 09/29/15 0432  NA 129* 132*  K 4.8 5.1  CL 97* 95*  CO2 24 28  GLUCOSE 120* 96  BUN 26* 34*  CREATININE 1.17* 1.31*  CALCIUM 8.6* 8.7*    PT/INR:  Lab Results  Component Value Date   INR 2.12* 09/29/2015   INR 1.50* 09/28/2015   INR 1.19 09/27/2015   ABG:  INR: Will add last result for INR, ABG once components are confirmed Will add last 4 CBG results once components are confirmed  Assessment/Plan:  1. CV - History of a fib. Maintaining SR in the 70's. On Amiodarone 200 mg bid, Lisinopril 20 mg daily and Lopressor 12.5 mg bid.   She was started on Coumadin 05/07. INR increased from 1.5 to 2.12. Will decrease Coumadin to 2.5 mg.  2.  Pulmonary - On 2-3 liters of oxygen via [place]. Attempt to wean to room air. Likely will need home oxygen.Encourage incentive spirometer.Encourage incentive spirometer and flutter valve. 3. Volume Overload - Will give Lasix 40 mg bid again today. I/O's negative so doubt she is gaining weight. 4.  Acute blood loss anemia - H and H stable at 7.7 and 24.4. Continue Trinsicon. 5. Creatinine increased from 1.17 to 1.3. Was given Lasix two times yesterday and is on Lisinopril. Will recheck BMET in am and if Cr continues to increase, will adjust meds. 6.Mag Cit for constipation 7. Will need SNF when ready for discharge  ZIMMERMAN,DONIELLE MPA-C 09/29/2015,7:41 AM   I have seen and examined [name] and agree with the above assessment  and plan.  Grace Isaac MD [place] [phone] Office [phone] 09/29/2015 3:15 PM

## 2015-09-29 NOTE — Clinical Social Work Note (Signed)
Clinical Social Work Assessment  Patient Details  Name: [name] MRN: [id number] Date of Birth: May 17, 1941  Date of referral:  09/29/15               Reason for consult:  Discharge Planning                Permission sought to share information with:  Chartered certified accountant granted to share information::  Yes, Verbal Permission Granted  Name::     [name]   Agency::  SNFs  Relationship::  Daughter  Contact Information:     Housing/Transportation Living arrangements for the past 2 months:  Uncertain of Information:  Patient Patient Interpreter Needed:    Criminal Activity/Legal Involvement Pertinent to Current Situation/Hospitalization:  No - Comment as needed Significant Relationships:  Adult Children Lives with:  Self Do you feel safe going back to the place where you live?  Yes Need for family participation in patient care:  Yes (Comment)  Care giving concerns:  The patient is aggregable for short term rehab at discharge. Patient would like to rebuild  her strength to return home.    Social Worker assessment / plan: CSW met with patient at beside to complete assessment. Patient was resting comfortably in bed. CSW explained PT recommendation for SNF placement. CSW explained SNF search and placement process to the patient and answered her questions. Patient reported her support as her daughter. Per patient request CSW attempted to reach patient's daughter [name] , message left for return call. CSW will follow up with bed offers.   Employment status:  Retired Forensic scientist:  [employer] PT Recommendations:  [place] / Referral to community resources:  [place]  Patient/Family's Response to care:  The patient appears happy with the care she is receiving in hospital and is appreciative of CSW assistance.  Patient/Family's Understanding of and Emotional Response to Diagnosis, Current  Treatment, and Prognosis:  The patient has a good understanding of why she was admitted. She understands the care plan and what she will need post discharge.  Emotional Assessment Appearance:  Appears stated age Attitude/Demeanor/Rapport:   (Patient was welcoming of CSW and appropriate. ) Affect (typically observed):  Accepting, Calm, Appropriate Orientation:  Oriented to Self, Oriented to Place, Oriented to  Time, Oriented to Situation Alcohol / Substance use:  Not Applicable Psych involvement (Current and /or in the community):  No (Comment)  Discharge Needs  Concerns to be addressed:  Discharge Planning Concerns Readmission within the last 30 days:  No Current discharge risk:  Physical Impairment Barriers to Discharge:  Continued Medical Work up   [employer], LCSW 09/29/2015, 12:02 PM

## 2015-09-30 DIAGNOSIS — I729 Aneurysm of unspecified site: Secondary | ICD-10-CM | POA: Diagnosis not present

## 2015-09-30 DIAGNOSIS — R079 Chest pain, unspecified: Secondary | ICD-10-CM | POA: Diagnosis not present

## 2015-09-30 DIAGNOSIS — M6281 Muscle weakness (generalized): Secondary | ICD-10-CM | POA: Diagnosis not present

## 2015-09-30 DIAGNOSIS — K219 Gastro-esophageal reflux disease without esophagitis: Secondary | ICD-10-CM | POA: Diagnosis not present

## 2015-09-30 DIAGNOSIS — R2231 Localized swelling, mass and lump, right upper limb: Secondary | ICD-10-CM | POA: Diagnosis not present

## 2015-09-30 DIAGNOSIS — Z7982 Long term (current) use of aspirin: Secondary | ICD-10-CM | POA: Diagnosis not present

## 2015-09-30 DIAGNOSIS — G4733 Obstructive sleep apnea (adult) (pediatric): Secondary | ICD-10-CM | POA: Diagnosis not present

## 2015-09-30 DIAGNOSIS — M25511 Pain in right shoulder: Secondary | ICD-10-CM | POA: Diagnosis not present

## 2015-09-30 DIAGNOSIS — M797 Fibromyalgia: Secondary | ICD-10-CM | POA: Diagnosis not present

## 2015-09-30 DIAGNOSIS — I25810 Atherosclerosis of coronary artery bypass graft(s) without angina pectoris: Secondary | ICD-10-CM | POA: Diagnosis not present

## 2015-09-30 DIAGNOSIS — R0902 Hypoxemia: Secondary | ICD-10-CM | POA: Diagnosis not present

## 2015-09-30 DIAGNOSIS — I5032 Chronic diastolic (congestive) heart failure: Secondary | ICD-10-CM | POA: Diagnosis not present

## 2015-09-30 DIAGNOSIS — D649 Anemia, unspecified: Secondary | ICD-10-CM | POA: Diagnosis not present

## 2015-09-30 DIAGNOSIS — Z87891 Personal history of nicotine dependence: Secondary | ICD-10-CM | POA: Diagnosis not present

## 2015-09-30 DIAGNOSIS — R2689 Other abnormalities of gait and mobility: Secondary | ICD-10-CM | POA: Diagnosis not present

## 2015-09-30 DIAGNOSIS — H409 Unspecified glaucoma: Secondary | ICD-10-CM | POA: Diagnosis not present

## 2015-09-30 DIAGNOSIS — I2510 Atherosclerotic heart disease of native coronary artery without angina pectoris: Secondary | ICD-10-CM | POA: Diagnosis not present

## 2015-09-30 DIAGNOSIS — E785 Hyperlipidemia, unspecified: Secondary | ICD-10-CM | POA: Diagnosis not present

## 2015-09-30 DIAGNOSIS — I480 Paroxysmal atrial fibrillation: Secondary | ICD-10-CM | POA: Diagnosis not present

## 2015-09-30 DIAGNOSIS — I721 Aneurysm of artery of upper extremity: Secondary | ICD-10-CM | POA: Diagnosis not present

## 2015-09-30 DIAGNOSIS — Z48812 Encounter for surgical aftercare following surgery on the circulatory system: Secondary | ICD-10-CM | POA: Diagnosis not present

## 2015-09-30 DIAGNOSIS — I252 Old myocardial infarction: Secondary | ICD-10-CM | POA: Diagnosis not present

## 2015-09-30 DIAGNOSIS — F411 Generalized anxiety disorder: Secondary | ICD-10-CM | POA: Diagnosis not present

## 2015-09-30 DIAGNOSIS — I509 Heart failure, unspecified: Secondary | ICD-10-CM | POA: Diagnosis not present

## 2015-09-30 DIAGNOSIS — Z79899 Other long term (current) drug therapy: Secondary | ICD-10-CM | POA: Diagnosis not present

## 2015-09-30 DIAGNOSIS — M159 Polyosteoarthritis, unspecified: Secondary | ICD-10-CM | POA: Diagnosis not present

## 2015-09-30 DIAGNOSIS — Z7901 Long term (current) use of anticoagulants: Secondary | ICD-10-CM | POA: Diagnosis not present

## 2015-09-30 DIAGNOSIS — R05 Cough: Secondary | ICD-10-CM | POA: Diagnosis not present

## 2015-09-30 DIAGNOSIS — I482 Chronic atrial fibrillation: Secondary | ICD-10-CM | POA: Diagnosis not present

## 2015-09-30 DIAGNOSIS — I10 Essential (primary) hypertension: Secondary | ICD-10-CM | POA: Diagnosis not present

## 2015-09-30 DIAGNOSIS — M1990 Unspecified osteoarthritis, unspecified site: Secondary | ICD-10-CM | POA: Diagnosis not present

## 2015-09-30 DIAGNOSIS — I4891 Unspecified atrial fibrillation: Secondary | ICD-10-CM | POA: Diagnosis not present

## 2015-09-30 DIAGNOSIS — Z951 Presence of aortocoronary bypass graft: Secondary | ICD-10-CM | POA: Diagnosis not present

## 2015-09-30 DIAGNOSIS — Z9981 Dependence on supplemental oxygen: Secondary | ICD-10-CM | POA: Diagnosis not present

## 2015-09-30 LAB — BASIC METABOLIC PANEL
ANION GAP: 9 (ref 5–15)
BUN: 35 mg/dL — AB (ref 6–20)
CHLORIDE: 93 mmol/L — AB (ref 101–111)
CO2: 30 mmol/L (ref 22–32)
Calcium: 8.8 mg/dL — ABNORMAL LOW (ref 8.9–10.3)
Creatinine, Ser: 1.34 mg/dL — ABNORMAL HIGH (ref 0.44–1.00)
GFR calc Af Amer: 44 mL/min — ABNORMAL LOW (ref >60)
GFR, EST NON AFRICAN AMERICAN: 38 mL/min — AB (ref >60)
Glucose, Bld: 95 mg/dL (ref 65–99)
POTASSIUM: 5.5 mmol/L — AB (ref 3.5–5.1)
Sodium: 132 mmol/L — ABNORMAL LOW (ref 135–145)

## 2015-09-30 LAB — PROTIME-INR
INR: 2.85 — ABNORMAL HIGH (ref 0.00–1.49)
Prothrombin Time: 29.4 seconds — ABNORMAL HIGH (ref 11.6–15.2)

## 2015-09-30 MED ORDER — FUROSEMIDE 40 MG PO TABS: 40.0000 mg | ORAL_TABLET | Freq: Two times a day (BID) | ORAL | Status: DC

## 2015-09-30 MED ORDER — ASPIRIN 81 MG PO TBEC: 81.0000 mg | DELAYED_RELEASE_TABLET | Freq: Every day | ORAL | Status: DC

## 2015-09-30 MED ORDER — ROSUVASTATIN CALCIUM 20 MG PO TABS: 20.0000 mg | ORAL_TABLET | Freq: Every day | ORAL | Status: DC

## 2015-09-30 MED ORDER — AMIODARONE HCL 200 MG PO TABS: 200.0000 mg | ORAL_TABLET | Freq: Every day | ORAL | Status: DC

## 2015-09-30 MED ORDER — TRAMADOL HCL 50 MG PO TABS: 50.0000 mg | ORAL_TABLET | ORAL | Status: DC | PRN

## 2015-09-30 MED ORDER — METOPROLOL TARTRATE 25 MG PO TABS: 12.5000 mg | ORAL_TABLET | Freq: Two times a day (BID) | ORAL | Status: DC

## 2015-09-30 MED ORDER — POLYETHYLENE GLYCOL 3350 17 G PO PACK
17.0000 g | PACK | Freq: Every day | ORAL | Status: DC
  Administered 2015-09-30: 17 g via ORAL
  Filled 2015-09-30: qty 1

## 2015-09-30 MED ORDER — POLYETHYLENE GLYCOL 3350 17 G PO PACK: 17.0000 g | PACK | Freq: Every day | ORAL | Status: DC

## 2015-09-30 MED ORDER — LISINOPRIL 20 MG PO TABS: 20.0000 mg | ORAL_TABLET | Freq: Every day | ORAL | Status: DC

## 2015-09-30 NOTE — Clinical Social Work Placement (Signed)
   CLINICAL SOCIAL WORK PLACEMENT  NOTE  Date:  09/30/2015  Patient Details  Name: [name] MRN: [id number] Date of Birth: Apr 08, 1941  Clinical Social Work is seeking post-discharge placement for this patient at the [place] level of care (*CSW will initial, date and re-position this form in  chart as items are completed):  Yes   Patient/family provided with [place] Work Department's list of facilities offering this level of care within the geographic area requested by the patient (or if unable, by the patient's family).  Yes   Patient/family informed of their freedom to choose among providers that offer the needed level of care, that participate in Medicare, Medicaid or managed care program needed by the patient, have an available bed and are willing to accept the patient.  Yes   Patient/family informed of [facility]'s ownership interest in [facility] and [facility], as well as of the fact that they are under no obligation to receive care at these facilities.  PASRR submitted to EDS on 09/29/15     PASRR number received on 09/29/15     Existing PASRR number confirmed on       FL2 transmitted to all facilities in geographic area requested by pt/family on 09/29/15     FL2 transmitted to all facilities within larger geographic area on       Patient informed that his/her managed care company has contracts with or will negotiate with certain facilities, including the following:        Yes   Patient/family informed of bed offers received.  Patient chooses bed at [facility];     Physician recommends and patient chooses bed at      Patient to be transferred to [facility]; on 09/30/15.  Patient to be transferred to facility by Ambulance      Patient family notified on 09/30/15 of transfer.  Name of family member notified:  [name],     PHYSICIAN Please prepare priority discharge summary,  including medications, Please sign FL2, Please prepare prescriptions     Additional Comment:  Per MD patient is ready to discharge to [facility]; . RN, patient, patient's family, and facility notified of discharge. RN given phone number for report and transport packet is on patient's chart. Ambulance transport requested. CSW signing off.   _______________________________________________ Samule Dry, LCSW 09/30/2015, 2:56 PM

## 2015-09-30 NOTE — Care Management Note (Signed)
Case Management Note Previous CM note initiated by [name] RN ,CM  Patient Details  Name: [name] MRN: [id number] Date of Birth: 02/09/1941  Subjective/Objective: Pt admitted for A Fib and Chest Pain. Plan is for cardiac cath 09-20-15. Pt is from home and plans to return home.         Action/Plan: CM did provide pt with an agency list for [facility]. CM will check back with pt in regards to agency choice. CM will continue to monitor.   Expected Discharge Date:    09/30/15              Expected Discharge Plan:  Skilled Nursing Facility  In-House Referral:  Clinical Social Work  Discharge planning Services  CM Consult  Post Acute Care Choice:  Home Health Choice offered to:  Patient  DME Arranged:  N/A DME Agency:  NA  HH Arranged:  PT HH Agency:     Status of Service:  Completed, signed off  Medicare Important Message Given:  Yes Date Medicare IM Given:    Medicare IM give by:    Date Additional Medicare IM Given:    Additional Medicare Important Message give by:     If discussed at [place] of Stay Meetings, dates discussed:    Additional Comments:  09/30/15- 1330- Marvetta Gibbons RN, BSN- pt for d/c to SNF today- CSW following for placement needs  CM spoke with pt; discharge plan is for pt to stay with daughter for 24 hour supervision post discharge  09/23/15 Pt transferred to ICU s/p CABG - pt remains intubated.  CM spoke with daughter Ena Dawley; pt lives alone and family actively working discharge plan that covers 24/7 for first week.   Dawayne Patricia, RN 09/30/2015, 1:36 PM

## 2015-09-30 NOTE — Consult Note (Signed)
   [facility] CM Inpatient Consult   09/30/2015  PHILENA FEY [birth year] [id number]  Patient screened for potential [place] Management services. Patient is eligible for [place] Medicare/ACO Registry. . Electronic medical record reveals patient's discharge plan is to a skilled nursing facility for rehab and there were no identifiable [facility] care management needs at this time.  If patient's post hospital needs change please place a [facility] Care Management consult. For questions please contact:   Natividad Brood, RN BSN [facility] Liaison  [phone] business mobile phone Toll free office [phone]

## 2015-09-30 NOTE — Progress Notes (Signed)
ANTICOAGULATION CONSULT NOTE - Initial Consult  Pharmacy Consult for warfarin >> Eliquis Indication: atrial fibrillation  Assessment: 65 YOF on Eliquis PTA for AFib. S/p CABG and was started on warfarin 5/7 with CVTS dosing. INR made a large jump overnight- currently INR is 2.85.  Per package insert, Eliquis cannot be started until the INR is <2. Last Hgb from 5/9- 7.7, plts were 203 that day. No bleeding noted.   Plan:  -Hold warfarin -Start Eliquis 5mg  BID when INR <2. If plan is to discharge patient today, recommend checking INR on Sun 5/14 or Mon 5/15    Allergies  Allergen Reactions  . Cortisone Nausea Only and Other (See Comments)    Pt gets very hot and flushed  . Sulfamethoxazole-Trimethoprim Other (See Comments)    Reaction: unknown    Patient Measurements: Height: 5\' 6"  (167.6 cm) Weight: 240 lb 11.2 oz (109.181 kg) IBW/kg (Calculated) : 59.3  Vital Signs: Temp: 97.8 F (36.6 C) (05/12 0449) Temp Source: Oral (05/12 0449) BP: 106/59 mmHg (05/12 0449) Pulse Rate: 65 (05/12 0449)  Labs:  Recent Labs  09/28/15 0325 09/29/15 0432 09/30/15 0414  LABPROT 18.2* 23.6* 29.4*  INR 1.50* 2.12* 2.85*  CREATININE  --  1.31* 1.34*    Estimated Creatinine Clearance: 46.8 mL/min (by C-G formula based on Cr of 1.34).  Paisli Silfies D. Celester Morgan, PharmD, BCPS Clinical Pharmacist Pager: [phone] 09/30/2015 8:15 AM

## 2015-09-30 NOTE — Progress Notes (Signed)
Physical Therapy Treatment Patient Details Name: [name] MRN: [id number] DOB: 1940/11/01 Today's Date: 09/30/2015    History of Present Illness Patient is a 75 y.o. female presenting with chest pain and atrial fibrillation; s/p CABG x1 5/8.    PT Comments    Pt ambulated 150' with RW and min-guard A with multiple standing rest breaks to catch breath. O2 sats down to 88% on 2L O2 with ambulation and continues to require min/ mod A to stand while keeping sternal precautions. PT will continue to follow.   Follow Up Recommendations  SNF     Equipment Recommendations  Other (comment)    Recommendations for Other Services       Precautions / Restrictions Precautions Precautions: Fall;Sternal Precaution Comments: pt could verbalize no pulling but could not remember any other precautions Restrictions Weight Bearing Restrictions: Yes Other Position/Activity Restrictions: sternal precautions    Mobility  Bed Mobility               General bed mobility comments: pt received in chair  Transfers Overall transfer level: Needs assistance Equipment used: Rolling walker (2 wheeled) Transfers: Sit to/from Stand Sit to Stand: Mod assist;Min assist         General transfer comment: mod A for power and fwd wt shift from recliner, min A from [facility]  Ambulation/Gait Ambulation/Gait assistance: Min guard Ambulation Distance (Feet): 150 Feet Assistive device: Rolling walker (2 wheeled) Gait Pattern/deviations: Step-through pattern;Decreased stride length Gait velocity: decreased Gait velocity interpretation: <1.8 ft/sec, indicative of risk for recurrent falls General Gait Details: pt needed 5 standing rest beraks to catch breath during ambulation and could not tolerate more than 150' today. HR remained stable low 70's. Pt kept on 2L O2, sats as low as 88%.    Stairs            Wheelchair Mobility    Modified Rankin (Stroke Patients Only)       Balance Overall  balance assessment: Needs assistance Sitting-balance support: Feet supported;No upper extremity supported Sitting balance-Leahy Scale: Good     Standing balance support: Single extremity supported;During functional activity Standing balance-Leahy Scale: Poor Standing balance comment: requires at least unilateral UE support for safe standing                    Cognition Arousal/Alertness: Awake/alert Behavior During Therapy: WFL for tasks assessed/performed Overall Cognitive Status: Within Functional Limits for tasks assessed                      Exercises      General Comments        Pertinent Vitals/Pain Pain Assessment: 0-10 Pain Score: 3  Pain Location: right chest Pain Descriptors / Indicators: Constant Pain Intervention(s): Monitored during session;Premedicated before session    Home Living                      Prior Function            PT Goals (current goals can now be found in the care plan section) Acute Rehab PT Goals Patient Stated Goal: to go to rehab and return to independence PT Goal Formulation: With patient Time For Goal Achievement: 10/12/15 Potential to Achieve Goals: Good Progress towards PT goals: Progressing toward goals    Frequency  Min 3X/week    PT Plan Current plan remains appropriate    Co-evaluation             End  of Session Equipment Utilized During Treatment: Gait belt Activity Tolerance: Patient tolerated treatment well Patient left: in chair;with call bell/phone within reach     Time: 0921-0954 PT Time Calculation (min) (ACUTE ONLY): 33 min  Charges:  $Gait Training: 8-22 mins $Therapeutic Activity: 8-22 mins                    G Codes:     Leighton Roach, PT  Acute Rehab Services  [phone]  Leighton Roach 09/30/2015, 11:23 AM

## 2015-09-30 NOTE — Progress Notes (Signed)
      [address]       [place] [address]             [phone]      7 Days Post-Op Procedure(s) (LRB): CORONARY ARTERY BYPASS GRAFTING (CABG) TIMES 1 USING LEFT INTERNAL MAMMARY TO THE LAD (N/A)  CLIPPING OF ATRIAL APPENDAGE (N/A) TRANSESOPHAGEAL ECHOCARDIOGRAM (TEE) (N/A) MAZE (N/A)   Subjective:  [name] states she is doing okay.  Pain is better after receiving pain medicines early.  Small BM yesterday  Objective: Vital signs in last 24 hours: Temp:  [97.7 F (36.5 C)-97.8 F (36.6 C)] 97.8 F (36.6 C) (05/12 0449) Pulse Rate:  [62-65] 65 (05/12 0449) Cardiac Rhythm:  [-] Sinus bradycardia (05/11 1900) Resp:  [18] 18 (05/12 0449) BP: (106-133)/(43-59) 106/59 mmHg (05/12 0449) SpO2:  [97 %-98 %] 98 % (05/12 0449) Weight:  [240 lb 11.2 oz (109.181 kg)] 240 lb 11.2 oz (109.181 kg) (05/12 0449)  Intake/Output from previous day: 05/11 0701 - 05/12 0700 In: 840 [P.O.:840] Out: 1350 [Urine:1350]  General appearance: alert, cooperative and no distress Heart: regular rate and rhythm Lungs: clear to auscultation bilaterally Abdomen: soft, non-tender; bowel sounds hypoactive; no masses,  no organomegaly Extremities: edema trace Wound: clean and dry  Lab Results: No results for input(s): WBC, HGB, HCT, PLT in the last 72 hours. BMET:  Recent Labs  09/29/15 0432 09/30/15 0414  NA 132* 132*  K 5.1 5.5*  CL 95* 93*  CO2 28 30  GLUCOSE 96 95  BUN 34* 35*  CREATININE 1.31* 1.34*  CALCIUM 8.7* 8.8*    PT/INR:  Recent Labs  09/30/15 0414  LABPROT 29.4*  INR 2.85*   ABG    Component Value Date/Time   PHART 7.353 09/24/2015 0357   HCO3 22.6 09/24/2015 0357   TCO2 27 09/25/2015 1755   ACIDBASEDEF 3.0* 09/24/2015 0357   O2SAT 92.0 09/24/2015 0357   CBG (last 3)   Recent Labs  09/27/15 1612 09/27/15 2118 09/28/15 0629  GLUCAP 104* 115* 101*    Assessment/Plan: S/P Procedure(s) (LRB): CORONARY ARTERY BYPASS GRAFTING (CABG) TIMES 1  USING LEFT INTERNAL MAMMARY TO THE LAD (N/A)  CLIPPING OF ATRIAL APPENDAGE (N/A) TRANSESOPHAGEAL ECHOCARDIOGRAM (TEE) (N/A) MAZE (N/A)  1. CV- H/O Atrial Fibrillation, maintaining NSR rate in the 60s- continue Amiodarone, Lopressor and Lisinopril... EPW wires have been removed, will d/c Coumadin INR at 2.5 and restart Eliquis 2. Pulm- remains on oxygen, continue IS.. Will continue wean at SNF 3. Renal- creatinine remains stable, 1.34 today, remains hypervolemic, on Lasix 40 mg BID.... Good U/O 4. Expected post operative blood loss anemia- mild Hgb at 8.2, continue Iron 5. Deconditioning- SNF at discharge 6. Dispo- patient stable, remains in NSR, EPW out, INR at 2.5 will stop Coumadin restart Home Eliquis, creatinine stable... Will discuss possible D/C to SNF today with Dr. Servando Snare if bed is available   LOS: 10 days    Ellwood Handler 09/30/2015

## 2015-09-30 NOTE — Care Management Important Message (Signed)
Important Message  Patient Details  Name: [name] MRN: [id number] Date of Birth: 12-Jun-1940   Medicare Important Message Given:  Yes    Luna Audia Abena 09/30/2015, 11:14 AM

## 2015-09-30 NOTE — Progress Notes (Signed)
CARDIAC REHAB PHASE I   PRE:  Rate/Rhythm: 53 SR  BP:  Sitting: 119/48        SaO2: 97% 2L  MODE:  Ambulation: 150 ft   POST:  Rate/Rhythm: 69 SR  BP:  Sitting: 120/40         SaO2: 92-93 2L  Pt states she feels better and stronger today. Pt ambulated 150 ft on 2L O2, rolling walker, gait belt, assist x2, slow, fairly steady gait, tolerated fairly well, standing rest x2.  Pt states she feels better today. Cardiac surgery discharge education completed. Reviewed IS, sternal precautions, activity progression, exercise, heart healthy diet, and phase 2 cardiac rehab. Pt verbalized understanding. Pt agrees to phase 2 cardiac rehab referral, will send to [facility] per pt request. Pt to recliner after walk, call bell within reach. Will follow as x1 if pt does not d/c to SNF today.    [place]ami[place]:[id number] Lenna Sciara, RN, BSN 09/30/2015 11:56 AM

## 2015-10-03 ENCOUNTER — Non-Acute Institutional Stay (SKILLED_NURSING_FACILITY): Payer: Medicare Other | Admitting: Internal Medicine

## 2015-10-03 ENCOUNTER — Encounter: Payer: Self-pay | Admitting: Internal Medicine

## 2015-10-03 DIAGNOSIS — E785 Hyperlipidemia, unspecified: Secondary | ICD-10-CM

## 2015-10-03 DIAGNOSIS — M25511 Pain in right shoulder: Secondary | ICD-10-CM | POA: Diagnosis not present

## 2015-10-03 DIAGNOSIS — K219 Gastro-esophageal reflux disease without esophagitis: Secondary | ICD-10-CM

## 2015-10-03 DIAGNOSIS — R0902 Hypoxemia: Secondary | ICD-10-CM | POA: Diagnosis not present

## 2015-10-03 DIAGNOSIS — G4733 Obstructive sleep apnea (adult) (pediatric): Secondary | ICD-10-CM | POA: Diagnosis not present

## 2015-10-03 DIAGNOSIS — Z951 Presence of aortocoronary bypass graft: Secondary | ICD-10-CM

## 2015-10-03 DIAGNOSIS — R2231 Localized swelling, mass and lump, right upper limb: Secondary | ICD-10-CM

## 2015-10-03 DIAGNOSIS — M159 Polyosteoarthritis, unspecified: Secondary | ICD-10-CM | POA: Diagnosis not present

## 2015-10-03 DIAGNOSIS — I480 Paroxysmal atrial fibrillation: Secondary | ICD-10-CM

## 2015-10-03 DIAGNOSIS — I10 Essential (primary) hypertension: Secondary | ICD-10-CM

## 2015-10-03 DIAGNOSIS — F411 Generalized anxiety disorder: Secondary | ICD-10-CM | POA: Diagnosis not present

## 2015-10-03 DIAGNOSIS — M797 Fibromyalgia: Secondary | ICD-10-CM | POA: Diagnosis not present

## 2015-10-03 NOTE — Progress Notes (Signed)
Patient ID: [name], female   DOB: June 26, 1940, 75 y.o.   MRN: [id number]    HISTORY AND PHYSICAL   DATE: 10/03/15  Location:  [facility] Room Number: 216 Place of Service: SNF (31)   Extended Emergency Contact Information Primary Emergency Contact: McKelvey,Noel Address: [place], [place] Phone: [phone] Mobile Phone: [phone] Relation: Daughter  Advanced Directive information Does patient have an advance directive?: No, Would patient like information on creating an advanced directive?: No - patient declined information FULL CODE Chief Complaint  Patient presents with  . New Admit To SNF    HPI:  75 yo female seen today as a new admission into SNF following hospital stay for CAD, afib, HTN, hyperlipidemia, GERD, OSA, anxiety, tobacco abuse, OA, anemia. She had a left heart cath that revealed 2 vessel disease. She underwent CABG x 1 vessel to LAD with MAZE procedure and pulmonary vein isolation to left atrial clip by CT surgery on 5/5th. On post op day 2 she began to have afib with RVR -->lopressor started and coumadin. She had the pacing wire removed. Coumadin stopped and  eliquis initiated. She eventually convered to SR. H/H 7.7/24.4 but no transfusion req'd. A1c 5.8%. Her chest tube was removed prior to d/c. She was unable to be weaned from 3L [place] O2. ABG prior to d/c ph 7.35, pCO2 41, pO2 65. She was d/cd to SNF for short term rehab.  She is c/a right shoulder pain since cardiac cath and CABG. She is unable to move it without having pain. No known trauma. No nursing concerns. No falls. She is a poor historian due to anxiety. Hx obtained from chart.  CAD/afib/HTN/hx MI - rate controlled on lopressor and amiodarone. BP stable on lisinopril. Takes lasix with Kdur. She takes ASA and eliquis for anticoagulation  FMS/OA/RLS - pain stable on tramadol and tylenol. mirapex helps restless  legs  Anxiety/GAD - uncontrolled on effexor  Hyperlipidemia - takes crestor. LDL 155. No new myalgias  OSA - not treated. She has hx intolerance to CPAP per old records  Hx multiple thyroid nodules - stable. Hx hyperthyroidism. TSH 4.769; free T4 0.74  Hematuria/recurrent UTI/stress urinary incontinence - stable on chronic nitrofurantoin  Anemia - Hgb 7.7. Takes iron and B12 supplements  GERD - stable on protonix. Constipation stable on miralax  Glaucoma - stable with eye gtts. Followed by ophthamology. Has req'd surgical tx in the past  Tobacco abuse - quit several yrs ago.  Past Medical History  Diagnosis Date  . Hyperlipidemia   . Fatigue   . Anxiety   . Coronary artery disease     a. s/p MI 2009 - PCI/DES distal  RCA w/ 2.75 x 18 Xience DES;  b. 05/2010 Myoview: Apical thinning, EF 73%;  c. 06/2010 Cath: moderate nonobs dzs, EF 65%;  d.  Lexiscan Myoview (07/2013):  No scar or ischemia, EF 67%; Normal Study  . Fibromyalgia   . Essential iris atrophy     Right side  . Menopause   . Glaucoma   . Environmental allergies   . GERD (gastroesophageal reflux disease)   . Osteoarthritis   . OSA (obstructive sleep apnea)   . Obesity (BMI 30-39.9)   . Hypertension     Past Surgical History  Procedure Laterality Date  . Cardiac catheterization  06/29/2010    Mild to moderate coronary artery irregularities.  Her  proximal left anterior descending artery is moderately narrowed.  She  also has an eccentric stenosis in the proximal LAD.  These do not appear  to obstruct flow at present, but they are fairly small vessels.  They  appeared to be between 1.5 and 2 mm in diamete  . Cardiac catheterization  08/02/2009    Patent stent  . Cardiac catheterization  05/12/2008    Stent to the distal RCA  . Cardiac catheterization N/A 09/20/2015    Procedure: Left Heart Cath and Coronary Angiography;  Surgeon: Jettie Booze, MD;  Location: [facility];  Service: Cardiovascular;   Laterality: N/A;  . Cardiac catheterization  09/20/2015    Procedure: Intravascular Ultrasound/IVUS;  Surgeon: Jettie Booze, MD;  Location: [facility];  Service: Cardiovascular;;  . Cardiac catheterization  09/20/2015    Procedure: Intravascular Pressure Wire/FFR Study;  Surgeon: Jettie Booze, MD;  Location: [facility];  Service: Cardiovascular;;  . Coronary artery bypass graft N/A 09/23/2015    Procedure: CORONARY ARTERY BYPASS GRAFTING (CABG) TIMES 1 USING LEFT INTERNAL MAMMARY TO THE LAD;  Surgeon: Grace Isaac, MD;  Location: [facility];  Service: Open Heart Surgery;  Laterality: N/A;  . Clipping of atrial appendage N/A 09/23/2015    Procedure:  CLIPPING OF ATRIAL APPENDAGE;  Surgeon: Grace Isaac, MD;  Location: [facility];  Service: Open Heart Surgery;  Laterality: N/A;  . Tee without cardioversion N/A 09/23/2015    Procedure: TRANSESOPHAGEAL ECHOCARDIOGRAM (TEE);  Surgeon: Grace Isaac, MD;  Location: [facility];  Service: Open Heart Surgery;  Laterality: N/A;  . Maze N/A 09/23/2015    Procedure: MAZE;  Surgeon: Grace Isaac, MD;  Location: [facility];  Service: Open Heart Surgery;  Laterality: N/A;    Patient Care Team: L.Donnie Coffin, MD as PCP - General (Family Medicine) Drema Dallas, MD as Referring Physician (Physical Medicine and Rehabilitation) Belva Crome, MD as Consulting Physician (Cardiology)  Social History   Social History  . Marital Status: Divorced    Spouse Name: N/A  . Number of Children: N/A  . Years of Education: N/A   Occupational History  . Not on file.   Social History Main Topics  . Smoking status: Former Smoker    Types: Cigarettes    Quit date: 09/13/1960  . Smokeless tobacco: Never Used  . Alcohol Use: No  . Drug Use: No  . Sexual Activity: Not on file   Other Topics Concern  . Not on file   Social History Narrative     reports that she quit smoking about 55 years ago. Her smoking use included Cigarettes. She  has never used smokeless tobacco. She reports that she does not drink alcohol or use illicit drugs.  Family History  Problem Relation Age of Onset  . Heart attack Mother   . Coronary artery disease Brother     had CABG  . Coronary artery disease Brother   . Coronary artery disease Brother   . Coronary artery disease Brother   . Coronary artery disease Brother   . Heart attack Brother   . Cancer Father    Family Status  Relation Status Death Age  . Mother Deceased 74    MI/CAD  . Brother Deceased 47    CAD / MI  . Brother Alive     CAD @ 60 had MI  . Brother Alive     CAD  . Brother Alive  CAD  . Brother Alive     CAD  . Daughter Alive     CAD  . Daughter Alive   . Father Deceased     Cancer     There is no immunization history on file for this patient.  Allergies  Allergen Reactions  . Cortisone Nausea Only and Other (See Comments)    Pt gets very hot and flushed  . Sulfamethoxazole-Trimethoprim Other (See Comments)    Reaction: unknown    Medications: Patient's Medications  New Prescriptions   No medications on file  Previous Medications   ACETAMINOPHEN (TYLENOL) 500 MG TABLET    Take 1,000 mg by mouth every 8 (eight) hours as needed for moderate pain.   AMIODARONE (PACERONE) 200 MG TABLET    Take 1 tablet (200 mg total) by mouth daily.   APIXABAN (ELIQUIS) 5 MG TABS TABLET    Take 1 tablet (5 mg total) by mouth 2 (two) times daily.   ASPIRIN EC 81 MG EC TABLET    Take 1 tablet (81 mg total) by mouth daily.   CYANOCOBALAMIN (VITAMIN B-12 PO)    Take 1 tablet by mouth daily.   FERROUS SULFATE 325 (65 FE) MG TABLET    Take 325 mg by mouth every morning.    FUROSEMIDE (LASIX) 40 MG TABLET    Take 1 tablet (40 mg total) by mouth 2 (two) times daily. For 7 days, then decrease to once daily   LISINOPRIL (PRINIVIL,ZESTRIL) 20 MG TABLET    Take 1 tablet (20 mg total) by mouth daily.   METOPROLOL TARTRATE (LOPRESSOR) 25 MG TABLET    Take 0.5 tablets (12.5 mg  total) by mouth 2 (two) times daily.   NITROFURANTOIN (MACRODANTIN) 100 MG CAPSULE    Take 100 mg by mouth daily.    PANTOPRAZOLE (PROTONIX) 40 MG TABLET    Take 40 mg by mouth daily.    POLYETHYLENE GLYCOL (MIRALAX / GLYCOLAX) PACKET    Take 17 g by mouth daily.   POTASSIUM CHLORIDE (K-DUR) 10 MEQ TABLET    TAKE 1 TABLET BY MOUTH DAILY   PRAMIPEXOLE (MIRAPEX) 1 MG TABLET    Take 1 mg by mouth at bedtime.    PREDNISOLONE ACETATE (PRED FORTE) 1 % OPHTHALMIC SUSPENSION    INSTILL 1 DROP TO OD QID. START AFTER SURGERY   ROSUVASTATIN (CRESTOR) 20 MG TABLET    Take 1 tablet (20 mg total) by mouth daily at 6 PM.   TIMOLOL (TIMOPTIC) 0.5 % OPHTHALMIC SOLUTION    INSTILL 1 DROP OD QAM   TRAMADOL (ULTRAM) 50 MG TABLET    Take 1-2 tablets (50-100 mg total) by mouth every 4 (four) hours as needed for moderate pain.   VENLAFAXINE (EFFEXOR) 75 MG TABLET    Take 37.5 mg by mouth 2 (two) times daily.   Modified Medications   No medications on file  Discontinued Medications   No medications on file    Review of Systems  Unable to perform ROS: Other   Very anxious Filed Vitals:   10/03/15 1210  BP: 121/59  Pulse: 54  Temp: 97.4 F (36.3 C)  TempSrc: Oral  Resp: 16  Height: 5\' 6"  (1.676 m)  Weight: 240 lb 11.2 oz (109.181 kg)   Body mass index is 38.87 kg/(m^2).  Physical Exam  Constitutional: She is oriented to person, place, and time. She appears well-developed and well-nourished.  sitting up in bed, Looks uncomfortable in NAD. [place] O2 intact  HENT:  Mouth/Throat:  Oropharynx is clear and moist. No oropharyngeal exudate.  Eyes: Pupils are equal, round, and reactive to light. No scleral icterus.  Neck: Neck supple. Carotid bruit is not present. No tracheal deviation present. No thyromegaly present.  Cardiovascular: Normal rate, regular rhythm and intact distal pulses.  Exam reveals no gallop and no friction rub.   Murmur (1/6 SEM) heard. No LE edema b/l. no calf TTP.   Pulmonary/Chest: Effort  normal and breath sounds normal. No stridor. No respiratory distress. She has no wheezes. She has no rales. She exhibits tenderness (midline incision healing well with no secondary signs of infection; no wound dehiscience ).  Abdominal: Soft. Bowel sounds are normal. She exhibits no distension and no mass. There is no hepatomegaly. There is no tenderness. There is no rebound and no guarding.  Musculoskeletal: She exhibits edema and tenderness.  Right coracoid process TTP with min swelling but no palpable cords; pain in shoulder with external rotation and abduction;  right lateral wrist with large TTP cyst vs hematoma (thrill palpable); grip strength reduced  Lymphadenopathy:    She has no cervical adenopathy.  Neurological: She is alert and oriented to person, place, and time.  Skin: Skin is warm and dry. No rash noted.  Psychiatric: She has a normal mood and affect. Her behavior is normal. Thought content normal.     Labs reviewed: Admission on 09/19/2015, Discharged on 09/30/2015  No results displayed because visit has over 200 results.    Office Visit on 07/29/2015  Component Date Value Ref Range Status  . Sodium 07/29/2015 138  135 - 146 mmol/L Final  . Potassium 07/29/2015 5.0  3.5 - 5.3 mmol/L Final  . Chloride 07/29/2015 101  98 - 110 mmol/L Final  . CO2 07/29/2015 22  20 - 31 mmol/L Final  . Glucose, Bld 07/29/2015 75  65 - 99 mg/dL Final  . BUN 07/29/2015 30* 7 - 25 mg/dL Final  . Creat 07/29/2015 1.13* 0.60 - 0.93 mg/dL Final  . Total Bilirubin 07/29/2015 0.4  0.2 - 1.2 mg/dL Final  . Alkaline Phosphatase 07/29/2015 77  33 - 130 U/L Final  . AST 07/29/2015 49* 10 - 35 U/L Final  . ALT 07/29/2015 52* 6 - 29 U/L Final  . Total Protein 07/29/2015 8.2* 6.1 - 8.1 g/dL Final  . Albumin 07/29/2015 3.6  3.6 - 5.1 g/dL Final  . Calcium 07/29/2015 9.8  8.6 - 10.4 mg/dL Final  . Cholesterol 07/29/2015 220* 125 - 200 mg/dL Final  . Triglycerides 07/29/2015 105  <150 mg/dL Final  . HDL  07/29/2015 44* >=46 mg/dL Final  . Total CHOL/HDL Ratio 07/29/2015 5.0  <=5.0 Ratio Final  . VLDL 07/29/2015 21  <30 mg/dL Final  . LDL Cholesterol 07/29/2015 155* <130 mg/dL Final   Comment:   Total Cholesterol/HDL Ratio:CHD Risk                        Coronary Heart Disease Risk Table                                        Men       Women          1/2 Average Risk              3.4        3.3  Average Risk              5.0        4.4           2X Average Risk              9.6        7.1           3X Average Risk             23.4       11.0 Use the calculated Patient Ratio above and the CHD Risk table  to determine the patient's CHD Risk.     Dg Chest 2 View  09/27/2015  CLINICAL DATA:  Chest soreness, shortness of breath, recent CABG EXAM: CHEST  2 VIEW COMPARISON:  Portable chest x-ray of 09/26/2015 FINDINGS: There are bilateral pleural effusions present left greater than right. Moderate cardiomegaly is stable and there appears to be minimal pulmonary vascular congestion. Left atrial appendage exclusion device is noted. Median sternotomy sutures are present. IMPRESSION: Stable cardiomegaly with small pleural effusions left-greater-than-right. Electronically Signed   By: Ivar Drape M.D.   On: 09/27/2015 08:07   Dg Chest 2 View  09/19/2015  CLINICAL DATA:  Atrial fibrillation tonight with chest burning and discomfort. EXAM: CHEST  2 VIEW COMPARISON:  12/19/2014 FINDINGS: Shallow inspiration with atelectasis in the lung bases. Cardiac enlargement with mild pulmonary vascular congestion. Developing interstitial changes in the lungs likely representing edema. No blunting of costophrenic angles. No pneumothorax. Calcification of the aorta. IMPRESSION: Cardiac enlargement with mild pulmonary vascular congestion and developing interstitial edema. Electronically Signed   By: Lucienne Capers M.D.   On: 09/19/2015 05:02   Dg Wrist Complete Left  09/11/2015  CLINICAL DATA:  Status post  fall, with left lateral and medial wrist pain and swelling. Initial encounter. EXAM: LEFT WRIST - COMPLETE 3+ VIEW COMPARISON:  Left wrist radiographs from 01/10/2015 FINDINGS: There is no evidence of fracture or dislocation. There is mild chronic deformity of the distal radius. The carpal rows are intact, and demonstrate normal alignment. The joint spaces are preserved. A chronic ulnar styloid fragment is noted. No significant soft tissue abnormalities are seen. IMPRESSION: No evidence of acute fracture or dislocation. Electronically Signed   By: Garald Balding M.D.   On: 09/11/2015 21:37   Dg Chest Port 1 View  09/26/2015  CLINICAL DATA:  Status post CABG 09/23/2015. EXAM: PORTABLE CHEST - 1 VIEW COMPARISON:  One-view chest x-ray 09/25/2015 FINDINGS: The heart is enlarged. The patient is status post median sternotomy for CABG. Intra clipping is noted. Atherosclerotic calcifications are present at the aortic arch. The right IJ sheath remains in place. Bilateral pleural effusions persist, left greater than right. Associated atelectasis is noted. Interstitial edema and overall lung volumes are improved. IMPRESSION: 1. Improving aeration of both lungs and decreasing interstitial edema. 2. Persistent bilateral pleural effusions and basilar airspace disease, left greater than right. 3. Stable right IJ sheath. Electronically Signed   By: San Morelle M.D.   On: 09/26/2015 07:49   Dg Chest Port 1 View  09/25/2015  CLINICAL DATA:  Patient status post CABG.  Shortness of breath. EXAM: PORTABLE CHEST 1 VIEW COMPARISON:  Chest radiograph 09/24/2015. FINDINGS: Interval retraction PA catheter with right IJ sheath projecting over the superior vena cava. Stable cardiac and mediastinal contours status post median sternotomy. Persistent focal consolidation and small effusion left lower lung. Unchanged heterogeneous right lung opacities and probable small right pleural effusion.  No pneumothorax. Interval removal left  chest tube. IMPRESSION: Interval removal left chest tube. No definite left-sided pneumothorax. Persistent left-greater-than-right basilar airspace opacities most compatible with atelectasis and small bilateral pleural effusions. Electronically Signed   By: Lovey Newcomer M.D.   On: 09/25/2015 07:58   Dg Chest Port 1 View  09/24/2015  CLINICAL DATA:  Pneumothorax and shortness of breath EXAM: PORTABLE CHEST 1 VIEW COMPARISON:  Yesterday FINDINGS: Interval tracheal and esophageal extubation. Increase in basilar opacity. Swan-Ganz catheter from right IJ approach tip near the main pulmonary artery outflow. Left-sided chest tube in stable position. No visible pneumothorax. Probable left pleural effusion. Pulmonary venous congestion. Stable cardiomegaly. The patient is status post left atrial appendage clipping. IMPRESSION: 1. Left chest tube in stable position with no visible pneumothorax. 2. Increased atelectasis after extubation. 3. Pulmonary venous congestion. Electronically Signed   By: Monte Fantasia M.D.   On: 09/24/2015 07:16   Dg Chest Port 1 View  09/23/2015  CLINICAL DATA:  CABG EXAM: PORTABLE CHEST 1 VIEW COMPARISON:  09/19/2015 FINDINGS: Changes of CABG. Swan-Ganz catheter tip is in the main pulmonary artery. Left chest tube is in place. Endotracheal tube is 4.4 cm above the carina. NG tube enters the stomach. There is cardiomegaly with vascular congestion and mild pulmonary edema. No pneumothorax. Left base atelectasis. IMPRESSION: Changes of CABG. No pneumothorax. Mild edema and left base atelectasis. Electronically Signed   By: Rolm Baptise M.D.   On: 09/23/2015 13:39     Assessment/Plan   ICD-9-CM ICD-10-CM   1. Right shoulder pain 719.41 M25.511   2. Wrist lump, right 782.2 R22.31    possible hematoma  3. S/P CABG x 1 V45.81 Z95.1   4. Paroxysmal atrial fibrillation (HCC) 427.31 I48.0   5. Essential hypertension 401.9 I10   6. Fibromyalgia 729.1 M79.7   7. Anxiety state  300.00 F41.1   8.  Hyperlipidemia 272.4 E78.5   9. Gastroesophageal reflux disease without esophagitis 530.81 K21.9   10. Generalized OA 715.00 M15.9   11. OSA (obstructive sleep apnea) - intolerant to CPAP 327.23 G47.33   12.     Hypoxia - O2 dependent  PT eval and tx for right shoulder pain in addition to post CABG tx  Check RUE venous doppler US to r/o DVT  Cont current meds as ordered  Check CBC and BMP  OT and ST as ordered  F/u with CT sx as scheduled  [place] O2 @ 2L/min and titrate to keep O2 > 89%. Pulse ox qshift  Wound care as indicated  May need further w/u of hypoxia if she does not improve on her own.  GOAL: short term rehab and d/c home when medically appropriate. Communicated with pt and nursing.  Will follow  Amulya Quintin S. Perlie Gold  [facility] [address] [place], [place] [address] [phone] Cell (Monday-Friday 8 AM - 5 PM) [phone] After 5 PM and follow prompts

## 2015-10-03 NOTE — Progress Notes (Signed)
HISTORY AND PHYSICAL   DATE:   Location:  [facility]  Nursing Home Room Number: 216 Place of Service: SNF (31)   Extended Emergency Contact Information Primary Emergency Contact: McKelvey,Noel Address: [place], [place] Phone: [phone] Mobile Phone: [phone] Relation: Daughter  Advanced Directive information Does patient have an advance directive?: No, Would patient like information on creating an advanced directive?: No - patient declined information  Chief Complaint  Patient presents with  . New Admit To SNF    HPI:    Past Medical History  Diagnosis Date  . Hyperlipidemia   . Fatigue   . Anxiety   . Coronary artery disease     a. s/p MI 2009 - PCI/DES distal  RCA w/ 2.75 x 18 Xience DES;  b. 05/2010 Myoview: Apical thinning, EF 73%;  c. 06/2010 Cath: moderate nonobs dzs, EF 65%;  d.  Lexiscan Myoview (07/2013):  No scar or ischemia, EF 67%; Normal Study  . Fibromyalgia   . Essential iris atrophy     Right side  . Menopause   . Glaucoma   . Environmental allergies   . GERD (gastroesophageal reflux disease)   . Osteoarthritis   . OSA (obstructive sleep apnea)   . Obesity (BMI 30-39.9)   . Hypertension     Past Surgical History  Procedure Laterality Date  . Cardiac catheterization  06/29/2010    Mild to moderate coronary artery irregularities.  Her  proximal left anterior descending artery is moderately narrowed.  She  also has an eccentric stenosis in the proximal LAD.  These do not appear  to obstruct flow at present, but they are fairly small vessels.  They  appeared to be between 1.5 and 2 mm in diamete  . Cardiac catheterization  08/02/2009    Patent stent  . Cardiac catheterization  05/12/2008    Stent to the distal RCA  . Cardiac catheterization N/A 09/20/2015    Procedure: Left Heart Cath and Coronary Angiography;  Surgeon: Jettie Booze, MD;  Location: [facility];   Service: Cardiovascular;  Laterality: N/A;  . Cardiac catheterization  09/20/2015    Procedure: Intravascular Ultrasound/IVUS;  Surgeon: Jettie Booze, MD;  Location: [place] [facility];  Service: Cardiovascular;;  . Cardiac catheterization  09/20/2015    Procedure: Intravascular Pressure Wire/FFR Study;  Surgeon: Jettie Booze, MD;  Location: [place] [facility];  Service: Cardiovascular;;  . Coronary artery bypass graft N/A 09/23/2015    Procedure: CORONARY ARTERY BYPASS GRAFTING (CABG) TIMES 1 USING LEFT INTERNAL MAMMARY TO THE LAD;  Surgeon: Grace Isaac, MD;  Location: [place];  Service: Open Heart Surgery;  Laterality: N/A;  . Clipping of atrial appendage N/A 09/23/2015    Procedure:  CLIPPING OF ATRIAL APPENDAGE;  Surgeon: Grace Isaac, MD;  Location: [place];  Service: Open Heart Surgery;  Laterality: N/A;  . Tee without cardioversion N/A 09/23/2015    Procedure: TRANSESOPHAGEAL ECHOCARDIOGRAM (TEE);  Surgeon: Grace Isaac, MD;  Location: [place];  Service: Open Heart Surgery;  Laterality: N/A;  . Maze N/A 09/23/2015    Procedure: MAZE;  Surgeon: Grace Isaac, MD;  Location: [place];  Service: Open Heart Surgery;  Laterality: N/A;    Patient Care Team: L.Donnie Coffin, MD as PCP - General (Family Medicine) Drema Dallas, MD as Referring Physician (Physical Medicine and Rehabilitation) Lynnell Dike  Tamala Julian, MD as Consulting Physician (Cardiology)  Social History   Social History  . Marital Status: Divorced    Spouse Name: N/A  . Number of Children: N/A  . Years of Education: N/A   Occupational History  . Not on file.   Social History Main Topics  . Smoking status: Former Smoker    Types: Cigarettes    Quit date: 09/13/1960  . Smokeless tobacco: Never Used  . Alcohol Use: No  . Drug Use: No  . Sexual Activity: Not on file   Other Topics Concern  . Not on file   Social History Narrative     reports that she quit smoking about 55 years ago. Her smoking  use included Cigarettes. She has never used smokeless tobacco. She reports that she does not drink alcohol or use illicit drugs.  Family History  Problem Relation Age of Onset  . Heart attack Mother   . Coronary artery disease Brother     had CABG  . Coronary artery disease Brother   . Coronary artery disease Brother   . Coronary artery disease Brother   . Coronary artery disease Brother   . Heart attack Brother   . Cancer Father    Family Status  Relation Status Death Age  . Mother Deceased 73    MI/CAD  . Brother Deceased 97    CAD / MI  . Brother Alive     CAD @ 11 had MI  . Brother Alive     CAD  . Brother Alive     CAD  . Brother Alive     CAD  . Daughter Alive     CAD  . Daughter Alive   . Father Deceased     Cancer     There is no immunization history on file for this patient.  Allergies  Allergen Reactions  . Cortisone Nausea Only and Other (See Comments)    Pt gets very hot and flushed  . Sulfamethoxazole-Trimethoprim Other (See Comments)    Reaction: unknown    Medications: Patient's Medications  New Prescriptions   No medications on file  Previous Medications   ACETAMINOPHEN (TYLENOL) 500 MG TABLET    Take 1,000 mg by mouth every 8 (eight) hours as needed for moderate pain.   AMIODARONE (PACERONE) 200 MG TABLET    Take 1 tablet (200 mg total) by mouth daily.   APIXABAN (ELIQUIS) 5 MG TABS TABLET    Take 1 tablet (5 mg total) by mouth 2 (two) times daily.   ASPIRIN EC 81 MG EC TABLET    Take 1 tablet (81 mg total) by mouth daily.   CYANOCOBALAMIN (VITAMIN B-12 PO)    Take 1 tablet by mouth daily.   FERROUS SULFATE 325 (65 FE) MG TABLET    Take 325 mg by mouth every morning.    FUROSEMIDE (LASIX) 40 MG TABLET    Take 1 tablet (40 mg total) by mouth 2 (two) times daily. For 7 days, then decrease to once daily   LISINOPRIL (PRINIVIL,ZESTRIL) 20 MG TABLET    Take 1 tablet (20 mg total) by mouth daily.   METOPROLOL TARTRATE (LOPRESSOR) 25 MG TABLET     Take 0.5 tablets (12.5 mg total) by mouth 2 (two) times daily.   NITROFURANTOIN (MACRODANTIN) 100 MG CAPSULE    Take 100 mg by mouth daily.    PANTOPRAZOLE (PROTONIX) 40 MG TABLET    Take 40 mg by mouth daily.    POLYETHYLENE GLYCOL (MIRALAX /  GLYCOLAX) PACKET    Take 17 g by mouth daily.   POTASSIUM CHLORIDE (K-DUR) 10 MEQ TABLET    TAKE 1 TABLET BY MOUTH DAILY   PRAMIPEXOLE (MIRAPEX) 1 MG TABLET    Take 1 mg by mouth at bedtime.    PREDNISOLONE ACETATE (PRED FORTE) 1 % OPHTHALMIC SUSPENSION    INSTILL 1 DROP TO OD QID. START AFTER SURGERY   ROSUVASTATIN (CRESTOR) 20 MG TABLET    Take 1 tablet (20 mg total) by mouth daily at 6 PM.   TIMOLOL (TIMOPTIC) 0.5 % OPHTHALMIC SOLUTION    INSTILL 1 DROP OD QAM   TRAMADOL (ULTRAM) 50 MG TABLET    Take 1-2 tablets (50-100 mg total) by mouth every 4 (four) hours as needed for moderate pain.   VENLAFAXINE (EFFEXOR) 75 MG TABLET    Take 37.5 mg by mouth 2 (two) times daily.   Modified Medications   No medications on file  Discontinued Medications   No medications on file    Review of Systems  Unable to perform ROS: Other   Very anxious Filed Vitals:   10/03/15 1210  BP: 121/59  Pulse: 54  Temp: 97.4 F (36.3 C)  TempSrc: Oral  Resp: 16  Height: 5\' 6"  (1.676 m)  Weight: 240 lb 11.2 oz (109.181 kg)   Body mass index is 38.87 kg/(m^2).  Physical Exam  Constitutional: She is oriented to person, place, and time. She appears well-developed and well-nourished.  sitting up in bed, Looks uncomfortable in NAD. [place] O2 intact  HENT:  Mouth/Throat: Oropharynx is clear and moist. No oropharyngeal exudate.  Eyes: Pupils are equal, round, and reactive to light. No scleral icterus.  Neck: Neck supple. Carotid bruit is not present. No tracheal deviation present. No thyromegaly present.  Cardiovascular: Normal rate, regular rhythm and intact distal pulses.  Exam reveals no gallop and no friction rub.   Murmur (1/6 SEM) heard. No LE edema b/l. no calf TTP.    Pulmonary/Chest: Effort normal and breath sounds normal. No stridor. No respiratory distress. She has no wheezes. She has no rales. She exhibits tenderness (midline incision healing well with no secondary signs of infection; no wound dehiscience ).  Abdominal: Soft. Bowel sounds are normal. She exhibits no distension and no mass. There is no hepatomegaly. There is no tenderness. There is no rebound and no guarding.  Musculoskeletal: She exhibits edema and tenderness.  Right coracoid process TTP with min swelling but no palpable cords; pain in shoulder with external rotation and abduction;  right lateral wrist with large TTP cyst vs hematoma; grip strength reduced  Lymphadenopathy:    She has no cervical adenopathy.  Neurological: She is alert and oriented to person, place, and time.  Skin: Skin is warm and dry. No rash noted.  Psychiatric: She has a normal mood and affect. Her behavior is normal. Thought content normal.     Labs reviewed: Admission on 09/19/2015, Discharged on 09/30/2015  No results displayed because visit has over 200 results.    Office Visit on 07/29/2015  Component Date Value Ref Range Status  . Sodium 07/29/2015 138  135 - 146 mmol/L Final  . Potassium 07/29/2015 5.0  3.5 - 5.3 mmol/L Final  . Chloride 07/29/2015 101  98 - 110 mmol/L Final  . CO2 07/29/2015 22  20 - 31 mmol/L Final  . Glucose, Bld 07/29/2015 75  65 - 99 mg/dL Final  . BUN 07/29/2015 30* 7 - 25 mg/dL Final  . Creat 07/29/2015 1.13*  0.60 - 0.93 mg/dL Final  . Total Bilirubin 07/29/2015 0.4  0.2 - 1.2 mg/dL Final  . Alkaline Phosphatase 07/29/2015 77  33 - 130 U/L Final  . AST 07/29/2015 49* 10 - 35 U/L Final  . ALT 07/29/2015 52* 6 - 29 U/L Final  . Total Protein 07/29/2015 8.2* 6.1 - 8.1 g/dL Final  . Albumin 07/29/2015 3.6  3.6 - 5.1 g/dL Final  . Calcium 07/29/2015 9.8  8.6 - 10.4 mg/dL Final  . Cholesterol 07/29/2015 220* 125 - 200 mg/dL Final  . Triglycerides 07/29/2015 105  <150 mg/dL Final   . HDL 07/29/2015 44* >=46 mg/dL Final  . Total CHOL/HDL Ratio 07/29/2015 5.0  <=5.0 Ratio Final  . VLDL 07/29/2015 21  <30 mg/dL Final  . LDL Cholesterol 07/29/2015 155* <130 mg/dL Final   Comment:   Total Cholesterol/HDL Ratio:CHD Risk                        Coronary Heart Disease Risk Table                                        Men       Women          1/2 Average Risk              3.4        3.3              Average Risk              5.0        4.4           2X Average Risk              9.6        7.1           3X Average Risk             23.4       11.0 Use the calculated Patient Ratio above and the CHD Risk table  to determine the patient's CHD Risk.     Dg Chest 2 View  09/27/2015  CLINICAL DATA:  Chest soreness, shortness of breath, recent CABG EXAM: CHEST  2 VIEW COMPARISON:  Portable chest x-ray of 09/26/2015 FINDINGS: There are bilateral pleural effusions present left greater than right. Moderate cardiomegaly is stable and there appears to be minimal pulmonary vascular congestion. Left atrial appendage exclusion device is noted. Median sternotomy sutures are present. IMPRESSION: Stable cardiomegaly with small pleural effusions left-greater-than-right. Electronically Signed   By: Ivar Drape M.D.   On: 09/27/2015 08:07   Dg Chest 2 View  09/19/2015  CLINICAL DATA:  Atrial fibrillation tonight with chest burning and discomfort. EXAM: CHEST  2 VIEW COMPARISON:  12/19/2014 FINDINGS: Shallow inspiration with atelectasis in the lung bases. Cardiac enlargement with mild pulmonary vascular congestion. Developing interstitial changes in the lungs likely representing edema. No blunting of costophrenic angles. No pneumothorax. Calcification of the aorta. IMPRESSION: Cardiac enlargement with mild pulmonary vascular congestion and developing interstitial edema. Electronically Signed   By: Lucienne Capers M.D.   On: 09/19/2015 05:02   Dg Wrist Complete Left  09/11/2015  CLINICAL DATA:  Status  post fall, with left lateral and medial wrist pain and swelling. Initial encounter. EXAM: LEFT WRIST - COMPLETE 3+ VIEW COMPARISON:  Left wrist radiographs  from 01/10/2015 FINDINGS: There is no evidence of fracture or dislocation. There is mild chronic deformity of the distal radius. The carpal rows are intact, and demonstrate normal alignment. The joint spaces are preserved. A chronic ulnar styloid fragment is noted. No significant soft tissue abnormalities are seen. IMPRESSION: No evidence of acute fracture or dislocation. Electronically Signed   By: Garald Balding M.D.   On: 09/11/2015 21:37   Dg Chest Port 1 View  09/26/2015  CLINICAL DATA:  Status post CABG 09/23/2015. EXAM: PORTABLE CHEST - 1 VIEW COMPARISON:  One-view chest x-ray 09/25/2015 FINDINGS: The heart is enlarged. The patient is status post median sternotomy for CABG. Intra clipping is noted. Atherosclerotic calcifications are present at the aortic arch. The right IJ sheath remains in place. Bilateral pleural effusions persist, left greater than right. Associated atelectasis is noted. Interstitial edema and overall lung volumes are improved. IMPRESSION: 1. Improving aeration of both lungs and decreasing interstitial edema. 2. Persistent bilateral pleural effusions and basilar airspace disease, left greater than right. 3. Stable right IJ sheath. Electronically Signed   By: San Morelle M.D.   On: 09/26/2015 07:49   Dg Chest Port 1 View  09/25/2015  CLINICAL DATA:  Patient status post CABG.  Shortness of breath. EXAM: PORTABLE CHEST 1 VIEW COMPARISON:  Chest radiograph 09/24/2015. FINDINGS: Interval retraction PA catheter with right IJ sheath projecting over the superior vena cava. Stable cardiac and mediastinal contours status post median sternotomy. Persistent focal consolidation and small effusion left lower lung. Unchanged heterogeneous right lung opacities and probable small right pleural effusion. No pneumothorax. Interval removal  left chest tube. IMPRESSION: Interval removal left chest tube. No definite left-sided pneumothorax. Persistent left-greater-than-right basilar airspace opacities most compatible with atelectasis and small bilateral pleural effusions. Electronically Signed   By: Lovey Newcomer M.D.   On: 09/25/2015 07:58   Dg Chest Port 1 View  09/24/2015  CLINICAL DATA:  Pneumothorax and shortness of breath EXAM: PORTABLE CHEST 1 VIEW COMPARISON:  Yesterday FINDINGS: Interval tracheal and esophageal extubation. Increase in basilar opacity. Swan-Ganz catheter from right IJ approach tip near the main pulmonary artery outflow. Left-sided chest tube in stable position. No visible pneumothorax. Probable left pleural effusion. Pulmonary venous congestion. Stable cardiomegaly. The patient is status post left atrial appendage clipping. IMPRESSION: 1. Left chest tube in stable position with no visible pneumothorax. 2. Increased atelectasis after extubation. 3. Pulmonary venous congestion. Electronically Signed   By: Monte Fantasia M.D.   On: 09/24/2015 07:16   Dg Chest Port 1 View  09/23/2015  CLINICAL DATA:  CABG EXAM: PORTABLE CHEST 1 VIEW COMPARISON:  09/19/2015 FINDINGS: Changes of CABG. Swan-Ganz catheter tip is in the main pulmonary artery. Left chest tube is in place. Endotracheal tube is 4.4 cm above the carina. NG tube enters the stomach. There is cardiomegaly with vascular congestion and mild pulmonary edema. No pneumothorax. Left base atelectasis. IMPRESSION: Changes of CABG. No pneumothorax. Mild edema and left base atelectasis. Electronically Signed   By: Rolm Baptise M.D.   On: 09/23/2015 13:39     Assessment/Plan   ICD-9-CM ICD-10-CM   1. Right shoulder pain 719.41 M25.511   2. Wrist lump, right 782.2 R22.31    possible hematoma  3. S/P CABG x 1 V45.81 Z95.1   4. Paroxysmal atrial fibrillation (HCC) 427.31 I48.0   5. Essential hypertension 401.9 I10   6. Fibromyalgia 729.1 M79.7   7. Anxiety state 300.00 F41.1     8. Hyperlipidemia 272.4 E78.5   9.  Gastroesophageal reflux disease without esophagitis 530.81 K21.9   10. Generalized OA 715.00 M15.9   11. OSA (obstructive sleep apnea) 327.23 G47.33   12.     Hypoxia - O2 dependent  PT eval and tx for right shoulder pain in addition to post CABG tx  Check RUE venous doppler US to r/o DVT  Cont current meds as ordered  Check CBC and BMP  OT and ST as ordered  F/u with CT sx as scheduled  [place] O2 @ 2L/min and titrate to keep O2 > 89%. Pulse ox qshift  Wound care as indicated  GOAL: short term rehab and d/c home when medically appropriate. Communicated with pt and nursing.  Will follow  Gadge Hermiz S. Perlie Gold  [facility] [address] [place], [place] [address] [phone] Cell (Monday-Friday 8 AM - 5 PM) [phone] After 5 PM and follow prompts

## 2015-10-05 LAB — BASIC METABOLIC PANEL
BUN: 20 mg/dL (ref 4–21)
Creatinine: 1.1 mg/dL (ref 0.5–1.1)
Glucose: 58 mg/dL
Potassium: 5.1 mmol/L (ref 3.4–5.3)
Sodium: 127 mmol/L — AB (ref 137–147)

## 2015-10-05 LAB — CBC AND DIFFERENTIAL
HCT: 24 % — AB (ref 36–46)
HEMOGLOBIN: 7.4 g/dL — AB (ref 12.0–16.0)
PLATELETS: 474 10*3/uL — AB (ref 150–399)
WBC: 10.9 10*3/mL

## 2015-10-06 ENCOUNTER — Other Ambulatory Visit: Payer: Self-pay | Admitting: *Deleted

## 2015-10-06 MED ORDER — TRAMADOL HCL 50 MG PO TABS: ORAL_TABLET | ORAL | Status: AC

## 2015-10-06 NOTE — Telephone Encounter (Signed)
[facility] 

## 2015-10-10 NOTE — Progress Notes (Signed)
Cardiology Office Note    Date:  10/13/2015   ID:  Dietrich, Skeeters Oct 31, 1940, MRN [id number]  PCP:  Donnie Coffin, MD  Cardiologist:  Dr. Acie Fredrickson   Post hospital f/u  History of Present Illness:  [name] is a 75 y.o. female with a history of CAD s/p DES to RCA (2009), HTN, HLD, OSA and PAF on Eliquis who presents to clinic after recent admission for CAD s/p CABG x1V to LAD w/ MAZE and LA clip.  Recently admitted to [facility] from 5/1-5/12/17. She initially presented with sx with afib with RVR. She converted into NSR. Due to ST depression on ECG during RVR, mildly elevated troponin and cardiac hx, it was decided to proceed with coronary angiography. This revealed 50-60% ostial LAD lesion with positive FFR and 50% mRCA lesion with + FFR. LAD disease was not ideal for PCI given the ostial nature of this lesion. There would have to be some stent into the short left main, which could compromise the circumflex. The RCA lesion was amenable to PCI. It was deicded to proceed with surgical consultation. 2D ECHO with normal LV function, mild AS and mild LAE. She ultimately underwent CABG  x1 with the LIMA--> LAD and maze procedure with pulmonary vein isolation and placement of 40-mm left atrial clip by Dr. Servando Snare on 09/23/2015. Post op course was c/b afib with RVR and she was loaded with amiodarone and placed back on home Eliquis.   Today she presents to clinic for follow up. She has been living at [facility] and will be there for the next two weeks or so. She has been on 02 since discharge. One night she felt so SOB at the nursing facility that she almost went to the ER. This was relieved with high flow 02. She has been having pain around the incision site for which she takes pain medication. She has felt fluttering in her chest a couple time. No dizziness or syncope, but does sometimes feel a little woozy. She does have some LE edema, orthopnea but no PND. Does not wear CPAP due to feeling like she is  smothering. No blood in stool or urine.    Past Medical History  Diagnosis Date  . Hyperlipidemia   . Fatigue   . Anxiety   . Coronary artery disease     a. s/p MI 2009 - PCI/DES distal  RCA w/ 2.75 x 18 Xience DES;  b. 05/2010 Myoview: Apical thinning, EF 73%;  c. 06/2010 Cath: moderate nonobs dzs, EF 65%;  d.  Lexiscan Myoview (07/2013):  No scar or ischemia, EF 67%; Normal Study  . Fibromyalgia   . Essential iris atrophy     Right side  . Menopause   . Glaucoma   . Environmental allergies   . GERD (gastroesophageal reflux disease)   . Osteoarthritis   . OSA (obstructive sleep apnea)   . Obesity (BMI 30-39.9)   . Hypertension     Past Surgical History  Procedure Laterality Date  . Cardiac catheterization  06/29/2010    Mild to moderate coronary artery irregularities.  Her  proximal left anterior descending artery is moderately narrowed.  She  also has an eccentric stenosis in the proximal LAD.  These do not appear  to obstruct flow at present, but they are fairly small vessels.  They  appeared to be between 1.5 and 2 mm in diamete  . Cardiac catheterization  08/02/2009    Patent stent  . Cardiac catheterization  05/12/2008    Stent to the distal RCA  . Cardiac catheterization N/A 09/20/2015    Procedure: Left Heart Cath and Coronary Angiography;  Surgeon: Jettie Booze, MD;  Location: [place] [facility];  Service: Cardiovascular;  Laterality: N/A;  . Cardiac catheterization  09/20/2015    Procedure: Intravascular Ultrasound/IVUS;  Surgeon: Jettie Booze, MD;  Location: [place] [facility];  Service: Cardiovascular;;  . Cardiac catheterization  09/20/2015    Procedure: Intravascular Pressure Wire/FFR Study;  Surgeon: Jettie Booze, MD;  Location: [place] [facility];  Service: Cardiovascular;;  . Coronary artery bypass graft N/A 09/23/2015    Procedure: CORONARY ARTERY BYPASS GRAFTING (CABG) TIMES 1 USING LEFT INTERNAL MAMMARY TO THE LAD;  Surgeon: Grace Isaac, MD;   Location: [place];  Service: Open Heart Surgery;  Laterality: N/A;  . Clipping of atrial appendage N/A 09/23/2015    Procedure:  CLIPPING OF ATRIAL APPENDAGE;  Surgeon: Grace Isaac, MD;  Location: [place];  Service: Open Heart Surgery;  Laterality: N/A;  . Tee without cardioversion N/A 09/23/2015    Procedure: TRANSESOPHAGEAL ECHOCARDIOGRAM (TEE);  Surgeon: Grace Isaac, MD;  Location: [place];  Service: Open Heart Surgery;  Laterality: N/A;  . Maze N/A 09/23/2015    Procedure: MAZE;  Surgeon: Grace Isaac, MD;  Location: [place];  Service: Open Heart Surgery;  Laterality: N/A;    Current Medications: Outpatient Prescriptions Prior to Visit  Medication Sig Dispense Refill  . acetaminophen (TYLENOL) 500 MG tablet Take 1,000 mg by mouth every 8 (eight) hours as needed for moderate pain.    Marland Kitchen amiodarone (PACERONE) 200 MG tablet Take 1 tablet (200 mg total) by mouth daily.    Marland Kitchen apixaban (ELIQUIS) 5 MG TABS tablet Take 1 tablet (5 mg total) by mouth 2 (two) times daily. 60 tablet 11  . aspirin EC 81 MG EC tablet Take 1 tablet (81 mg total) by mouth daily.    . Cyanocobalamin (VITAMIN B-12 PO) Take 1 tablet by mouth daily.    . ferrous sulfate 325 (65 FE) MG tablet Take 325 mg by mouth every morning.     Marland Kitchen lisinopril (PRINIVIL,ZESTRIL) 20 MG tablet Take 1 tablet (20 mg total) by mouth daily.    . metoprolol tartrate (LOPRESSOR) 25 MG tablet Take 0.5 tablets (12.5 mg total) by mouth 2 (two) times daily.    . nitrofurantoin (MACRODANTIN) 100 MG capsule Take 100 mg by mouth daily.     . pantoprazole (PROTONIX) 40 MG tablet Take 40 mg by mouth daily.     . polyethylene glycol (MIRALAX / GLYCOLAX) packet Take 17 g by mouth daily. 14 each 0  . potassium chloride (K-DUR) 10 MEQ tablet TAKE 1 TABLET BY MOUTH DAILY (Patient taking differently: TAKE 10 MEQ BY MOUTH DAILY) 90 tablet 1  . pramipexole (MIRAPEX) 1 MG tablet Take 1 mg by mouth at bedtime.     . prednisoLONE acetate (PRED FORTE) 1 % ophthalmic  suspension INSTILL 1 DROP TO OD QID. START AFTER SURGERY  0  . rosuvastatin (CRESTOR) 20 MG tablet Take 1 tablet (20 mg total) by mouth daily at 6 PM.    . timolol (TIMOPTIC) 0.5 % ophthalmic solution INSTILL 1 DROP OD QAM  0  . traMADol (ULTRAM) 50 MG tablet Take one tablet by mouth every 4 hours as needed for moderate pain; Take two tablets by mouth every 4 hours as needed for severe pain 360 tablet 0  . venlafaxine (EFFEXOR)  75 MG tablet Take 37.5 mg by mouth 2 (two) times daily.     . furosemide (LASIX) 40 MG tablet Take 1 tablet (40 mg total) by mouth 2 (two) times daily. For 7 days, then decrease to once daily 30 tablet    No facility-administered medications prior to visit.     Allergies:   Cortisone and Sulfamethoxazole-trimethoprim   Social History   Social History  . Marital Status: Divorced    Spouse Name: N/A  . Number of Children: N/A  . Years of Education: N/A   Social History Main Topics  . Smoking status: Former Smoker    Types: Cigarettes    Quit date: 09/13/1960  . Smokeless tobacco: Never Used  . Alcohol Use: No  . Drug Use: No  . Sexual Activity: Not Asked   Other Topics Concern  . None   Social History Narrative     Family History:  The patient's family history includes Cancer in her father; Coronary artery disease in her brother, brother, brother, brother, and brother; Heart attack in her brother and mother.   ROS:   Please see the history of present illness.    ROS All other systems reviewed and are negative.   PHYSICAL EXAM:   VS:  BP 128/72 mmHg  Pulse 81  Ht 5\' 6"  (1.676 m)  Wt 226 lb (102.513 kg)  BMI 36.49 kg/m2   GEN: Well nourished, well developed, in no acute distressobese  HEENT: normal Neck: no JVD, carotid bruits, or masses Cardiac: RRR; + SEM, rubs, or gallops,no edema  Respiratory:  clear to auscultation bilaterally, normal work of breathing GI: soft, nontender, nondistended, + BS MS: no deformity or atrophy Skin: warm and  dry, no rash Neuro:  Alert and Oriented x 3, Strength and sensation are intact Psych: euthymic mood, full affect  Wt Readings from Last 3 Encounters:  10/13/15 226 lb (102.513 kg)  10/03/15 240 lb 11.2 oz (109.181 kg)  09/30/15 240 lb 11.2 oz (109.181 kg)      Studies/Labs Reviewed:   EKG:  EKG is ordered today.  The ekg ordered today demonstrates NSR HR 81, inferior Q waves  Recent Labs: 09/19/2015: B Natriuretic Peptide 46.8; TSH 4.769* 09/23/2015: ALT 81* 09/24/2015: Magnesium 1.8 09/27/2015: Hemoglobin 7.7*; Platelets 203 09/30/2015: BUN 35*; Creatinine, Ser 1.34*; Potassium 5.5*; Sodium 132*   Lipid Panel    Component Value Date/Time   CHOL 220* 07/29/2015 1537   TRIG 105 07/29/2015 1537   HDL 44* 07/29/2015 1537   CHOLHDL 5.0 07/29/2015 1537   VLDL 21 07/29/2015 1537   LDLCALC 155* 07/29/2015 1537   LDLDIRECT 144.0 11/21/2010 1032    Additional studies/ records that were reviewed today include:  Procedure (s):  Intravascular Pressure Wire/FFR Study By Dr. Irish Lack on 09/20/2015:   Intravascular Ultrasound/IVUS   Left Heart Cath and Coronary Angiography    Conclusion     Ost LAD to Prox LAD lesion, 50% stenosed. Mid LAD lesion, 60% stenosed. the FFR across this disease was 0.77.  Mid RCA lesion, 50% stenosed. The FFR across this lesion was 0.79.  Mild aortic valve gradient.  Mildly elevated LVEDP  LAD disease is not ideal for PCI given the ostial nature of this lesion. There would have to be some stent into the short left main, which could compromise the circumflex. The RCA lesion is amenable to PCI. Will obtain surgical consultation per Dr. Tamala Julian to evaluate this option versus medical therapy, as her symptoms are controlled when her rhythm  is controlled.    Coronary artery bypass grafting x1 with the left internal mammary to the left anterior descending coronary artery and maze procedure with pulmonary vein isolation and placement of 40-mm left atrial  clip by Dr. Servando Snare on 09/23/2015.     2D ECHO: 09/21/2015 LV EF: 55% - 60% Study Conclusions - Left ventricle: The cavity size was normal. There was mild  concentric hypertrophy. Systolic function was normal. The  estimated ejection fraction was in the range of 55% to 60%. Wall  motion was normal; there were no regional wall motion  abnormalities. Doppler parameters are consistent with elevated  ventricular end-diastolic filling pressure. - Aortic valve: There was very mild stenosis. Valve area (VTI):  1.69 cm^2. Valve area (Vmax): 1.51 cm^2. Valve area (Vmean): 1.57  cm^2. - Left atrium: The atrium was mildly dilated. - Atrial septum: No defect or patent foramen ovale was identified.    ASSESSMENT:    1. Coronary artery disease involving autologous artery coronary bypass graft without angina pectoris   2. PAF (paroxysmal atrial fibrillation) ([place])   3. Essential hypertension   4. HLD (hyperlipidemia)   5. Chronic diastolic CHF (congestive heart failure) ([place])   6. OSA (obstructive sleep apnea)   7. Hyperkalemia      PLAN:  In order of problems listed above:  CAD: s/p DES to RCA (2009) and CABGx1 w/ LIMA--> LAD on 09/23/15. Residual RCA disease which can be treated with PCI at later date if needed. For now continue BB and statin. No ASA due to eliquis use.    PAF: s/p MAZE and LA clip (09/23/15). Maintaining NSR today. CHADSVASC of at least (CHF, HTN, age, CAD, F sex). Continue Eliquis 5mg  BID. Continue amiodarone 200mg  daily. If she require this long term will need routine TSH, LFTs, CXR. Of note both TSH and LFTs were mildly elevated during most recent admission. Recent CXR and PFTS also done.   HTN: BP well controlled on current regimen  HLD: continue statin  Chronic diastolic CHF: discharged on lasix 40mg  daily. BMET today. Will also check a BNP. She has had continued SOB and some mild LE edema and orthopnea. If elevated will increase lasix to 40mg  BID x3-4 days.    OSA:  Does not wear CPAP due to feeling like she is smothering.   Hyperkalemia: K was mildly elevated on 09/30/15. Will check a BMET today.   Medication Adjustments/Labs and Tests Ordered: Current medicines are reviewed at length with the patient today.  Concerns regarding medicines are outlined above.  Medication changes, Labs and Tests ordered today are listed in the Patient Instructions below. Patient Instructions  Lab work today ( bmet,bnp )  Continue same medications   Appointment with Dr.Nahser Thursday 02/02/16 at 1:45 pm     Signed, Angelena Form, PA-C  10/13/2015 9:56 AM    [facility] [address], [place], [place]  [address] Phone: [phone]; Fax: [phone]

## 2015-10-13 ENCOUNTER — Encounter: Payer: Self-pay | Admitting: Physician Assistant

## 2015-10-13 ENCOUNTER — Ambulatory Visit (INDEPENDENT_AMBULATORY_CARE_PROVIDER_SITE_OTHER): Payer: Medicare Other | Admitting: Physician Assistant

## 2015-10-13 VITALS — BP 128/72 | HR 81 | Ht 66.0 in | Wt 226.0 lb

## 2015-10-13 DIAGNOSIS — I25810 Atherosclerosis of coronary artery bypass graft(s) without angina pectoris: Secondary | ICD-10-CM

## 2015-10-13 DIAGNOSIS — I2510 Atherosclerotic heart disease of native coronary artery without angina pectoris: Secondary | ICD-10-CM

## 2015-10-13 DIAGNOSIS — I5032 Chronic diastolic (congestive) heart failure: Secondary | ICD-10-CM

## 2015-10-13 DIAGNOSIS — I10 Essential (primary) hypertension: Secondary | ICD-10-CM

## 2015-10-13 DIAGNOSIS — I480 Paroxysmal atrial fibrillation: Secondary | ICD-10-CM

## 2015-10-13 DIAGNOSIS — E875 Hyperkalemia: Secondary | ICD-10-CM

## 2015-10-13 DIAGNOSIS — E785 Hyperlipidemia, unspecified: Secondary | ICD-10-CM

## 2015-10-13 DIAGNOSIS — G4733 Obstructive sleep apnea (adult) (pediatric): Secondary | ICD-10-CM

## 2015-10-13 LAB — BASIC METABOLIC PANEL
BUN: 12 mg/dL (ref 7–25)
CALCIUM: 9 mg/dL (ref 8.6–10.4)
CO2: 28 mmol/L (ref 20–31)
Chloride: 99 mmol/L (ref 98–110)
Creat: 1.12 mg/dL — ABNORMAL HIGH (ref 0.60–0.93)
GLUCOSE: 85 mg/dL (ref 65–99)
Potassium: 4.3 mmol/L (ref 3.5–5.3)
SODIUM: 135 mmol/L (ref 135–146)

## 2015-10-13 LAB — BRAIN NATRIURETIC PEPTIDE: BRAIN NATRIURETIC PEPTIDE: 178.3 pg/mL — AB (ref <100)

## 2015-10-13 NOTE — Patient Instructions (Signed)
Lab work today ( bmet,bnp )  Continue same medications   Appointment with Dr.Nahser Thursday 02/02/16 at 1:45 pm

## 2015-10-13 NOTE — Addendum Note (Signed)
Addended by: Eulis Foster on: 10/13/2015 10:02 AM   Modules accepted: Orders

## 2015-10-17 ENCOUNTER — Telehealth: Payer: Self-pay | Admitting: Physician Assistant

## 2015-10-17 MED ORDER — METOPROLOL TARTRATE 25 MG PO TABS: 25.0000 mg | ORAL_TABLET | Freq: Two times a day (BID) | ORAL | Status: DC

## 2015-10-17 NOTE — Telephone Encounter (Signed)
Patient called reporting her heart rate was beating rapidly.  She felt very weak. Was a beta blocker was administered she started feeling better. Will increase the metoprolol to 25 mg twice a day. Prescription was faxed to [facility].  Tarri Fuller, [facility]

## 2015-10-17 NOTE — Addendum Note (Signed)
Addended by: Brett Canales on: 10/17/2015 05:08 PM   Modules accepted: Orders

## 2015-10-18 ENCOUNTER — Telehealth: Payer: Self-pay | Admitting: Cardiovascular Disease

## 2015-10-18 ENCOUNTER — Ambulatory Visit (INDEPENDENT_AMBULATORY_CARE_PROVIDER_SITE_OTHER): Payer: Medicare Other | Admitting: Vascular Surgery

## 2015-10-18 VITALS — BP 123/62 | HR 97 | Temp 98.0°F | Resp 18 | Ht 66.0 in | Wt 220.0 lb

## 2015-10-18 DIAGNOSIS — I2510 Atherosclerotic heart disease of native coronary artery without angina pectoris: Secondary | ICD-10-CM | POA: Diagnosis not present

## 2015-10-18 DIAGNOSIS — I729 Aneurysm of unspecified site: Secondary | ICD-10-CM | POA: Diagnosis not present

## 2015-10-18 NOTE — Telephone Encounter (Signed)
I have discussed this with Dr. Kellie Simmering. The patient thinks the aneurism is enlarging - over the past several days  I recommend stopping Eliquis not The plan is for surgical repair on Friday She is just out of CABG and is at minimal risk for CV complication with her surgery

## 2015-10-18 NOTE — Telephone Encounter (Signed)
New message    The receptiobnist at Dr. Evelena Leyden was only asked that Dr. Acie Fredrickson call Dr. Evelena Leyden cell number [phone] there was no more information provided by the receptionist or from the MD .

## 2015-10-18 NOTE — Telephone Encounter (Signed)
New message     Request for surgical clearance:  1. What type of surgery is being performed? Repair of artery susto-aneurysm  2. When is this surgery scheduled? June 2 nd  3. Are there any medications that need to be held prior to surgery and how long? Eliquis is being held for 2-3 days prior to procedure  4. Name of physician performing surgery? Dr. Kellie Simmering  5. What is your office phone and fax number? [id number] number [phone]

## 2015-10-18 NOTE — Progress Notes (Signed)
Subjective:     Patient ID: [name], female   DOB: 16-Jan-1941, 75 y.o.   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 The patient had cardiac catheterization on 09/20/2015 by Dr. Leanora Cover subsequently coronary artery bypass grafting and maze procedure by Dr. Pia Mau on May 4. Patient has been on chronic Eliquis. She is currently at the [facility] and is due to be discharged neck week. She has had increasing-sized mass over the puncture site and her right radial artery which has become increasingly painful and has enlarged significantly over the last 3-4 days. Patient seems to be doing well following her cardiac surgery. She is on nasal oxygen. She has increasing her ambulation.  Past Medical History  Diagnosis Date  . Hyperlipidemia   . Fatigue   . Anxiety   . Coronary artery disease     a. s/p MI 2009 - PCI/DES distal  RCA w/ 2.75 x 18 Xience DES;  b. 05/2010 Myoview: Apical thinning, EF 73%;  c. 06/2010 Cath: moderate nonobs dzs, EF 65%;  d.  Lexiscan Myoview (07/2013):  No scar or ischemia, EF 67%; Normal Study  . Fibromyalgia   . Essential iris atrophy     Right side  . Menopause   . Glaucoma   . Environmental allergies   . GERD (gastroesophageal reflux disease)   . Osteoarthritis   . OSA (obstructive sleep apnea)   . Obesity (BMI 30-39.9)   . Hypertension     Social History  Substance Use Topics  . Smoking status: Former Smoker    Types: Cigarettes    Quit date: 09/13/1960  . Smokeless tobacco: Never Used  . Alcohol Use: No    Family History  Problem Relation Age of Onset  . Heart attack Mother   . Coronary artery disease Brother     had CABG  . Coronary artery disease Brother   . Coronary artery disease Brother   . Coronary artery disease Brother   . Coronary artery disease Brother   . Heart attack Brother   . Cancer Father     Allergies  Allergen Reactions  .  Cortisone Nausea Only and Other (See Comments)    Pt gets very hot and flushed  . Sulfamethoxazole-Trimethoprim Other (See Comments)    Reaction: unknown     Current outpatient prescriptions:  .  acetaminophen (TYLENOL) 500 MG tablet, Take 1,000 mg by mouth every 8 (eight) hours as needed for moderate pain., Disp: , Rfl:  .  amiodarone (PACERONE) 200 MG tablet, Take 1 tablet (200 mg total) by mouth daily., Disp: , Rfl:  .  apixaban (ELIQUIS) 5 MG TABS tablet, Take 1 tablet (5 mg total) by mouth 2 (two) times daily., Disp: 60 tablet, Rfl: 11 .  aspirin EC 81 MG EC tablet, Take 1 tablet (81 mg total) by mouth daily., Disp: , Rfl:  .  Cyanocobalamin (VITAMIN B-12 PO), Take 1 tablet by mouth daily., Disp: , Rfl:  .  ferrous sulfate 325 (65 FE) MG tablet, Take 325 mg by mouth every morning. , Disp: , Rfl:  .  furosemide (LASIX) 40 MG tablet, Take 40 mg by mouth daily., Disp: , Rfl:  .  lisinopril (PRINIVIL,ZESTRIL) 20 MG tablet, Take 1 tablet (20 mg total) by mouth daily., Disp: , Rfl:  .  metoprolol tartrate (LOPRESSOR) 25 MG tablet, Take 1 tablet (25 mg total) by mouth 2 (two) times daily., Disp: , Rfl:  .  nitrofurantoin (MACRODANTIN) 100 MG capsule, Take 100 mg by mouth daily. , Disp: , Rfl:  .  pantoprazole (PROTONIX) 40 MG tablet, Take 40 mg by mouth daily. , Disp: , Rfl:  .  polyethylene glycol (MIRALAX / GLYCOLAX) packet, Take 17 g by mouth daily., Disp: 14 each, Rfl: 0 .  potassium chloride (K-DUR) 10 MEQ tablet, TAKE 1 TABLET BY MOUTH DAILY (Patient taking differently: TAKE 10 MEQ BY MOUTH DAILY), Disp: 90 tablet, Rfl: 1 .  pramipexole (MIRAPEX) 1 MG tablet, Take 1 mg by mouth at bedtime. , Disp: , Rfl:  .  prednisoLONE acetate (PRED FORTE) 1 % ophthalmic suspension, INSTILL 1 DROP TO OD QID. START AFTER SURGERY, Disp: , Rfl: 0 .  rosuvastatin (CRESTOR) 20 MG tablet, Take 1 tablet (20 mg total) by mouth daily at 6 PM., Disp: , Rfl:  .  timolol (TIMOPTIC) 0.5 % ophthalmic solution,  INSTILL 1 DROP OD QAM, Disp: , Rfl: 0 .  traMADol (ULTRAM) 50 MG tablet, Take one tablet by mouth every 4 hours as needed for moderate pain; Take two tablets by mouth every 4 hours as needed for severe pain, Disp: 360 tablet, Rfl: 0 .  venlafaxine (EFFEXOR) 75 MG tablet, Take 37.5 mg by mouth 2 (two) times daily. , Disp: , Rfl:   Filed Vitals:   10/18/15 1415  BP: 123/62  Pulse: 97  Temp: 98 F (36.7 C)  Resp: 18  Height: 5\' 6"  (1.676 m)  Weight: 220 lb (99.791 kg)  SpO2: 98%    Body mass index is 35.53 kg/(m^2).            Review of Systems denies chest pain but does have significant dyspnea on exertion which is improving since she has been in rehabilitation. Biggest complaint is increasing pain and right wrist. No history of CVA. Denies lateralizing weakness, aphasia, amaurosis fugax, diplopia, blurred vision, or syncope. Other systems negative and complete review of systems    Objective:   Physical Exam BP 123/62 mmHg  Pulse 97  Temp(Src) 98 F (36.7 C)  Resp 18  Ht 5\' 6"  (1.676 m)  Wt 220 lb (99.791 kg)  BMI 35.53 kg/m2  SpO2 98%    Gen.-alert and oriented x3 in no apparent distress-on nasal oxygen-breathing is not labored HEENT normal for age Lungs no rhonchi or wheezing Cardiovascular regular rhythm no murmurs carotid pulses 3+ palpable no bruits audible-nicely healing median sternotomy incision Abdomen soft nontender no palpable masses Musculoskeletal free of  major deformities Skin clear -no rashes Neurologic normal Lower extremities 3+ femoral and dorsalis pedis pulses palpable bilaterally with no edema Right wrist has 3 x 3 cm firm tender mass over distal radial artery which is not pulsatile. 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 Liam Rogers

## 2015-10-19 ENCOUNTER — Other Ambulatory Visit: Payer: Self-pay

## 2015-10-20 ENCOUNTER — Encounter (HOSPITAL_COMMUNITY): Payer: Self-pay | Admitting: *Deleted

## 2015-10-20 MED ORDER — DEXTROSE 5 % IV SOLN
1.5000 g | INTRAVENOUS | Status: AC
  Administered 2015-10-21: 1.5 g via INTRAVENOUS
  Filled 2015-10-20: qty 1.5

## 2015-10-20 NOTE — Progress Notes (Signed)
Anesthesia Chart Review:  Pt is a 75 year old female scheduled for repair of R radial artery pseudoaneurysm on 10/21/2015 with Dr. Kellie Simmering.   Pt is a same day work up.   Cardiologist is Dr. Mertie Moores who is aware of upcoming surgery and gave ok to stop eliquis for surgery.   PMH includes:  CAD (DES to RCA 2009; s/p CABG (LIMA-LAD), MAZE, clipping of LA appendage 09/23/15), HTN, hyperlipidemia, glaucoma, OSA, on home oxygen at 3L, GERD. Former smoker. BMI 35.5  Medications include: amiodarone, eliquis, ASA, iron, lasix, lisinopril, metoprolol, protonix, potassium, crestor. Last dose of eliquis 10/18/15.   BMET 10/13/15 reviewed and acceptable for surgery. I-stat 4 will be obtained DOS.   Chest x-ray 09/27/15 reviewed. Stable cardiomegaly with small pleural effusions left-greater-than-right.  EKG 10/13/15: NSR. Cannot rule out inferior infarct, age undetermined.   TEE 09/23/15:  - Left ventricle: Systolic function was normal. The estimated ejection fraction was in the range of 50% to 55%. - Aortic valve: There was trivial regurgitation. Valve area (VTI): 1.35 cm^2. Valve area (Vmax): 1.35 cm^2. Valve area (Vmean): 1.34 cm^2. - Mitral valve: No evidence of vegetation. - Left atrium: No evidence of thrombus in the atrial cavity or appendage. No evidence of thrombus in the appendage. - Atrial septum: No defect or patent foramen ovale was identified. - Tricuspid valve: No evidence of vegetation.  Cardiac cath 09/20/15:   Ost LAD to Prox LAD lesion, 50% stenosed. Mid LAD lesion, 60% stenosed. the FFR across this disease was 0.77.  Mid RCA lesion, 50% stenosed. The FFR across this lesion was 0.79.  Mild aortic valve gradient.  Mildly elevated LVEDP  If no changes, I anticipate pt can proceed with surgery as scheduled.   Willeen Cass, FNP-BC [facility]/Anesthesiology Phone: [phone] 10/20/2015 11:59 AM

## 2015-10-20 NOTE — Progress Notes (Signed)
Pt SDW- Pre-op call was completed by pt nurse, Delphine, RN. Pt unavailable to complete anesthesia assessment, please complete on DOS. Rn to fax copy of MAR. RN made aware to stop vitamins, fish oil and herbal medications. RN verbalized understanding of all pre-op instructions.

## 2015-10-20 NOTE — Telephone Encounter (Signed)
The below note has a typographical error. It should read:    Expand All Collapse All   I have discussed this with Dr. Kellie Simmering. The patient thinks the aneurism is enlarging - over the past several days  I recommend stopping Eliquis now The plan is for surgical repair on Friday She is just out of CABG and is at minimal risk for CV complication with her surgery

## 2015-10-20 NOTE — Progress Notes (Signed)
Pt last dose of Eliquis was 5/30 /17 per Delphine, RN, at [place].

## 2015-10-21 ENCOUNTER — Ambulatory Visit (HOSPITAL_COMMUNITY)
Admission: RE | Admit: 2015-10-21 | Discharge: 2015-10-21 | Disposition: A | Discharge status: Skilled Nursing Facility | Payer: Medicare Other | Source: Ambulatory Visit | Attending: Vascular Surgery | Admitting: Vascular Surgery

## 2015-10-21 ENCOUNTER — Ambulatory Visit (HOSPITAL_COMMUNITY): Payer: Medicare Other | Admitting: Emergency Medicine

## 2015-10-21 ENCOUNTER — Encounter (HOSPITAL_COMMUNITY): Payer: Self-pay | Admitting: *Deleted

## 2015-10-21 ENCOUNTER — Encounter (HOSPITAL_COMMUNITY)
Admission: RE | Disposition: A | Discharge status: Skilled Nursing Facility | Payer: Self-pay | Source: Ambulatory Visit | Attending: Vascular Surgery

## 2015-10-21 DIAGNOSIS — Z951 Presence of aortocoronary bypass graft: Secondary | ICD-10-CM | POA: Insufficient documentation

## 2015-10-21 DIAGNOSIS — I482 Chronic atrial fibrillation: Secondary | ICD-10-CM | POA: Insufficient documentation

## 2015-10-21 DIAGNOSIS — I729 Aneurysm of unspecified site: Secondary | ICD-10-CM

## 2015-10-21 DIAGNOSIS — M797 Fibromyalgia: Secondary | ICD-10-CM | POA: Diagnosis not present

## 2015-10-21 DIAGNOSIS — Z87891 Personal history of nicotine dependence: Secondary | ICD-10-CM | POA: Insufficient documentation

## 2015-10-21 DIAGNOSIS — I252 Old myocardial infarction: Secondary | ICD-10-CM | POA: Insufficient documentation

## 2015-10-21 DIAGNOSIS — M1990 Unspecified osteoarthritis, unspecified site: Secondary | ICD-10-CM | POA: Insufficient documentation

## 2015-10-21 DIAGNOSIS — Z7982 Long term (current) use of aspirin: Secondary | ICD-10-CM | POA: Insufficient documentation

## 2015-10-21 DIAGNOSIS — F411 Generalized anxiety disorder: Secondary | ICD-10-CM | POA: Insufficient documentation

## 2015-10-21 DIAGNOSIS — Z9981 Dependence on supplemental oxygen: Secondary | ICD-10-CM | POA: Insufficient documentation

## 2015-10-21 DIAGNOSIS — E785 Hyperlipidemia, unspecified: Secondary | ICD-10-CM | POA: Insufficient documentation

## 2015-10-21 DIAGNOSIS — I2510 Atherosclerotic heart disease of native coronary artery without angina pectoris: Secondary | ICD-10-CM | POA: Insufficient documentation

## 2015-10-21 DIAGNOSIS — I721 Aneurysm of artery of upper extremity: Secondary | ICD-10-CM | POA: Diagnosis not present

## 2015-10-21 DIAGNOSIS — Z79899 Other long term (current) drug therapy: Secondary | ICD-10-CM | POA: Insufficient documentation

## 2015-10-21 DIAGNOSIS — K219 Gastro-esophageal reflux disease without esophagitis: Secondary | ICD-10-CM | POA: Insufficient documentation

## 2015-10-21 DIAGNOSIS — G4733 Obstructive sleep apnea (adult) (pediatric): Secondary | ICD-10-CM | POA: Insufficient documentation

## 2015-10-21 DIAGNOSIS — Z7901 Long term (current) use of anticoagulants: Secondary | ICD-10-CM | POA: Insufficient documentation

## 2015-10-21 DIAGNOSIS — H409 Unspecified glaucoma: Secondary | ICD-10-CM | POA: Insufficient documentation

## 2015-10-21 HISTORY — PX: RESECTION OF ARTERIOVENOUS FISTULA ANEURYSM: SHX6070

## 2015-10-21 HISTORY — DX: Dependence on supplemental oxygen: Z99.81

## 2015-10-21 LAB — POCT I-STAT 4, (NA,K, GLUC, HGB,HCT)
Glucose, Bld: 97 mg/dL (ref 65–99)
HCT: 34 % — ABNORMAL LOW (ref 36.0–46.0)
Hemoglobin: 11.6 g/dL — ABNORMAL LOW (ref 12.0–15.0)
Potassium: 4.2 mmol/L (ref 3.5–5.1)
Sodium: 137 mmol/L (ref 135–145)

## 2015-10-21 LAB — PROTIME-INR
INR: 1.13 (ref 0.00–1.49)
PROTHROMBIN TIME: 14.7 s (ref 11.6–15.2)

## 2015-10-21 SURGERY — RESECTION OF ARTERIOVENOUS FISTULA ANEURYSM
Anesthesia: Choice | Laterality: Right

## 2015-10-21 MED ORDER — PHENYLEPHRINE HCL 10 MG/ML IJ SOLN
INTRAMUSCULAR | Status: DC | PRN
  Administered 2015-10-21: 80 ug via INTRAVENOUS

## 2015-10-21 MED ORDER — PROMETHAZINE HCL 25 MG/ML IJ SOLN: 6.2500 mg | INTRAMUSCULAR | Status: DC | PRN

## 2015-10-21 MED ORDER — HEPARIN SODIUM (PORCINE) 1000 UNIT/ML IJ SOLN
INTRAMUSCULAR | Status: DC | PRN
  Administered 2015-10-21: 5000 [IU] via INTRAVENOUS

## 2015-10-21 MED ORDER — APIXABAN 5 MG PO TABS: 5.0000 mg | ORAL_TABLET | Freq: Two times a day (BID) | ORAL | Status: DC

## 2015-10-21 MED ORDER — MIDAZOLAM HCL 2 MG/2ML IJ SOLN: 0.5000 mg | Freq: Once | INTRAMUSCULAR | Status: DC | PRN

## 2015-10-21 MED ORDER — FENTANYL CITRATE (PF) 100 MCG/2ML IJ SOLN
INTRAMUSCULAR | Status: AC
  Filled 2015-10-21: qty 2

## 2015-10-21 MED ORDER — FENTANYL CITRATE (PF) 250 MCG/5ML IJ SOLN
INTRAMUSCULAR | Status: AC
  Filled 2015-10-21: qty 5

## 2015-10-21 MED ORDER — MEPERIDINE HCL 25 MG/ML IJ SOLN: 6.2500 mg | INTRAMUSCULAR | Status: DC | PRN

## 2015-10-21 MED ORDER — LIDOCAINE HCL (CARDIAC) 20 MG/ML IV SOLN
INTRAVENOUS | Status: DC | PRN
  Administered 2015-10-21: 50 mg via INTRAVENOUS

## 2015-10-21 MED ORDER — CHLORHEXIDINE GLUCONATE CLOTH 2 % EX PADS: 6.0000 | MEDICATED_PAD | Freq: Once | CUTANEOUS | Status: DC

## 2015-10-21 MED ORDER — SODIUM CHLORIDE 0.9 % IV SOLN
INTRAVENOUS | Status: DC | PRN
  Administered 2015-10-21: 10:00:00

## 2015-10-21 MED ORDER — LIDOCAINE-EPINEPHRINE (PF) 1 %-1:200000 IJ SOLN
INTRAMUSCULAR | Status: AC
  Filled 2015-10-21: qty 30

## 2015-10-21 MED ORDER — 0.9 % SODIUM CHLORIDE (POUR BTL) OPTIME
TOPICAL | Status: DC | PRN
  Administered 2015-10-21: 1000 mL

## 2015-10-21 MED ORDER — EPHEDRINE SULFATE 50 MG/ML IJ SOLN
INTRAMUSCULAR | Status: DC | PRN
  Administered 2015-10-21: 10 mg via INTRAVENOUS

## 2015-10-21 MED ORDER — PROPOFOL 10 MG/ML IV BOLUS
INTRAVENOUS | Status: DC | PRN
  Administered 2015-10-21: 150 mg via INTRAVENOUS

## 2015-10-21 MED ORDER — LACTATED RINGERS IV SOLN
INTRAVENOUS | Status: DC | PRN
  Administered 2015-10-21: 10:00:00 via INTRAVENOUS

## 2015-10-21 MED ORDER — FENTANYL CITRATE (PF) 100 MCG/2ML IJ SOLN
25.0000 ug | INTRAMUSCULAR | Status: DC | PRN
  Administered 2015-10-21 (×2): 25 ug via INTRAVENOUS
  Administered 2015-10-21: 50 ug via INTRAVENOUS
  Administered 2015-10-21 (×2): 25 ug via INTRAVENOUS

## 2015-10-21 MED ORDER — SODIUM CHLORIDE 0.9 % IV SOLN
INTRAVENOUS | Status: DC
  Administered 2015-10-21: 07:00:00 via INTRAVENOUS

## 2015-10-21 MED ORDER — FENTANYL CITRATE (PF) 100 MCG/2ML IJ SOLN
INTRAMUSCULAR | Status: DC | PRN
  Administered 2015-10-21: 50 ug via INTRAVENOUS

## 2015-10-21 SURGICAL SUPPLY — 33 items
BANDAGE ACE 4X5 VEL STRL LF (GAUZE/BANDAGES/DRESSINGS) ×2 IMPLANT
BNDG CMPR 9X4 STRL LF SNTH (GAUZE/BANDAGES/DRESSINGS) ×1
BNDG ESMARK 4X9 LF (GAUZE/BANDAGES/DRESSINGS) ×2 IMPLANT
BNDG GAUZE ELAST 4 BULKY (GAUZE/BANDAGES/DRESSINGS) ×2 IMPLANT
CANISTER SUCTION 2500CC (MISCELLANEOUS) ×3 IMPLANT
CLIP TI MEDIUM 6 (CLIP) ×3 IMPLANT
CLIP TI WIDE RED SMALL 6 (CLIP) ×3 IMPLANT
COVER PROBE W GEL 5X96 (DRAPES) IMPLANT
CUFF TOURNIQUET SINGLE 18IN (TOURNIQUET CUFF) ×2 IMPLANT
ELECT REM PT RETURN 9FT ADLT (ELECTROSURGICAL) ×3
ELECTRODE REM PT RTRN 9FT ADLT (ELECTROSURGICAL) ×1 IMPLANT
GAUZE SPONGE 4X4 12PLY STRL (GAUZE/BANDAGES/DRESSINGS) ×3 IMPLANT
GEL ULTRASOUND 20GR AQUASONIC (MISCELLANEOUS) IMPLANT
GLOVE SS BIOGEL STRL SZ 7 (GLOVE) ×1 IMPLANT
GLOVE SUPERSENSE BIOGEL SZ 7 (GLOVE) ×2
GOWN STRL REUS W/ TWL LRG LVL3 (GOWN DISPOSABLE) ×3 IMPLANT
GOWN STRL REUS W/TWL LRG LVL3 (GOWN DISPOSABLE) ×9
KIT BASIN OR (CUSTOM PROCEDURE TRAY) ×3 IMPLANT
KIT ROOM TURNOVER OR (KITS) ×3 IMPLANT
LIQUID BAND (GAUZE/BANDAGES/DRESSINGS) ×3 IMPLANT
NS IRRIG 1000ML POUR BTL (IV SOLUTION) ×3 IMPLANT
PACK CV ACCESS (CUSTOM PROCEDURE TRAY) ×3 IMPLANT
PAD ARMBOARD 7.5X6 YLW CONV (MISCELLANEOUS) ×6 IMPLANT
PAD CAST 4YDX4 CTTN HI CHSV (CAST SUPPLIES) IMPLANT
PADDING CAST COTTON 4X4 STRL (CAST SUPPLIES) ×3
SPONGE GAUZE 4X4 12PLY STER LF (GAUZE/BANDAGES/DRESSINGS) ×2 IMPLANT
SUT PROLENE 6 0 BV (SUTURE) ×3 IMPLANT
SUT PROLENE 6 0 CC (SUTURE) ×2 IMPLANT
SUT VIC AB 3-0 SH 27 (SUTURE) ×6
SUT VIC AB 3-0 SH 27X BRD (SUTURE) ×1 IMPLANT
SUT VIC AB 3-0 SH 8-18 (SUTURE) ×2 IMPLANT
UNDERPAD 30X30 INCONTINENT (UNDERPADS AND DIAPERS) ×3 IMPLANT
WATER STERILE IRR 1000ML POUR (IV SOLUTION) ×3 IMPLANT

## 2015-10-21 NOTE — Discharge Summary (Signed)
Discharge Summary    [name] Apr 16, 1941 75 y.o. 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 female was referred for evaluation of possible radial artery pseudoaneurysm. The patient had cardiac catheterization on 09/20/2015 by Dr. Leanora Cover subsequently coronary artery bypass grafting and maze procedure by Dr. Pia Mau on May 4. Patient has been on chronic Eliquis. She is currently at the rehabilitation facility[facility] and is due to be discharged neck week. She has had increasing-sized mass over the puncture site and her right radial artery which has become increasingly painful and has enlarged significantly over the last 3-4 days. Patient seems to be doing well following her cardiac surgery. She is on nasal oxygen. She has increasing her ambulation.  Hospital Course:  The patient was admitted to the hospital and taken to the operating room on 10/21/2015 and underwent: Suture repair of right radial artery and evacuation of hematoma.      The pt tolerated the procedure well and was transported to the PACU in good condition.   An ace wrap was put in place to decrease the space where the hematoma was.  This will be left 4-5 days.  The remainder of the hospital course consisted of increasing mobilization and increasing intake of solids without difficulty.  CBC    Component Value Date/Time   WBC 11.1* 09/27/2015 0310   RBC 2.65* 09/27/2015 0310   RBC 4.01 08/01/2009 2210   HGB 11.6* 10/21/2015 0756   HCT 34.0* 10/21/2015 0756   PLT 203 09/27/2015 0310   MCV 92.1 09/27/2015 0310   MCH 29.1 09/27/2015 0310   MCHC 31.6 09/27/2015 0310   RDW 15.9* 09/27/2015 0310   LYMPHSABS 1.7 09/19/2015 0432   MONOABS 0.5 09/19/2015 0432   EOSABS 0.2 09/19/2015 0432   BASOSABS 0.0 09/19/2015 0432    BMET    Component Value  Date/Time   NA 137 10/21/2015 0756   K 4.2 10/21/2015 0756   CL 99 10/13/2015 1002   CO2 28 10/13/2015 1002   GLUCOSE 97 10/21/2015 0756   BUN 12 10/13/2015 1002   CREATININE 1.12* 10/13/2015 1002   CREATININE 1.34* 09/30/2015 0414   CALCIUM 9.0 10/13/2015 1002   GFRNONAA 38* 09/30/2015 0414   GFRAA 44* 09/30/2015 0414      Discharge Instructions    Call MD for:  redness, tenderness, or signs of infection (pain, swelling, bleeding, redness, odor or green/yellow discharge around incision site)    Complete by:  As directed      Call MD for:  severe or increased pain, loss or decreased feeling  in affected limb(s)    Complete by:  As directed      Call MD for:  temperature >100.5    Complete by:  As directed      Discharge instructions    Complete by:  As directed   Resume Eliquis on Tuesday, October 25, 2015     Discharge wound care:    Complete by:  As directed   Leave ace wrap in place for 4-5 days.     Lifting restrictions    Complete by:  As directed   No lifting for 2 weeks     Resume previous diet    Complete by:  As directed            Discharge Diagnosis:  Right radial  artery pseudoaneurysm I72.1  Secondary Diagnosis: Patient Active Problem List   Diagnosis Date Noted  . Pseudoaneurysm ([place]) 10/18/2015  . Anxiety state 10/03/2015  . CAD (coronary artery disease) 09/23/2015  . Chest pain, rule out acute myocardial infarction   . New onset atrial fibrillation ([place])   . Atrial fibrillation with RVR ([place]) 12/19/2014  . Atrial fibrillation ([place]) 12/19/2014  . Multiple thyroid nodules 01/12/2014  . OSA (obstructive sleep apnea)   . Obesity (BMI 30-39.9)   . Sleep apnea 06/22/2013  . Fibromyalgia   . Dizziness 09/15/2010  . Orthostatic hypotension 09/15/2010  . Hematuria 09/15/2010  . Coronary artery disease   . Hypertension   . Hyperlipidemia   . Fatigue   . History of myocardial infarction    Past Medical History  Diagnosis Date  . Hyperlipidemia   .  Fatigue   . Anxiety   . Coronary artery disease     a. s/p MI 2009 - PCI/DES distal  RCA w/ 2.75 x 18 Xience DES;  b. 05/2010 Myoview: Apical thinning, EF 73%;  c. 06/2010 Cath: moderate nonobs dzs, EF 65%;  d.  Lexiscan Myoview (07/2013):  No scar or ischemia, EF 67%; Normal Study  . Fibromyalgia   . Essential iris atrophy     Right side  . Menopause   . Glaucoma   . Environmental allergies   . GERD (gastroesophageal reflux disease)   . Osteoarthritis   . Obesity (BMI 30-39.9)   . Hypertension   . Oxygen dependent     3L  . OSA (obstructive sleep apnea)     does not wear CPAP       Medication List    TAKE these medications        acetaminophen 500 MG tablet  Commonly known as:  TYLENOL  Take 1,000 mg by mouth every 8 (eight) hours as needed for moderate pain.     amiodarone 200 MG tablet  Commonly known as:  PACERONE  Take 1 tablet (200 mg total) by mouth daily.     apixaban 5 MG Tabs tablet  Commonly known as:  ELIQUIS  Take 1 tablet (5 mg total) by mouth 2 (two) times daily.  Start taking on:  10/25/2015     aspirin 81 MG EC tablet  Take 1 tablet (81 mg total) by mouth daily.     ferrous sulfate 325 (65 FE) MG tablet  Take 325 mg by mouth every morning.     furosemide 40 MG tablet  Commonly known as:  LASIX  Take 40 mg by mouth daily.     lisinopril 20 MG tablet  Commonly known as:  PRINIVIL,ZESTRIL  Take 1 tablet (20 mg total) by mouth daily.     metoprolol tartrate 25 MG tablet  Commonly known as:  LOPRESSOR  Take 1 tablet (25 mg total) by mouth 2 (two) times daily.     nitrofurantoin 100 MG capsule  Commonly known as:  MACRODANTIN  Take 100 mg by mouth daily.     pantoprazole 40 MG tablet  Commonly known as:  PROTONIX  Take 40 mg by mouth daily.     polyethylene glycol packet  Commonly known as:  MIRALAX / GLYCOLAX  Take 17 g by mouth daily.     potassium chloride 10 MEQ tablet  Commonly known as:  K-DUR  TAKE 1 TABLET BY MOUTH DAILY      pramipexole 1 MG tablet  Commonly known as:  MIRAPEX  Take 1 mg by  mouth at bedtime.     prednisoLONE acetate 1 % ophthalmic suspension  Commonly known as:  PRED FORTE  INSTILL 1 DROP TO OD QID. START AFTER SURGERY     rosuvastatin 20 MG tablet  Commonly known as:  CRESTOR  Take 1 tablet (20 mg total) by mouth daily at 6 PM.     timolol 0.5 % ophthalmic solution  Commonly known as:  TIMOPTIC  INSTILL 1 DROP OD QAM     traMADol 50 MG tablet  Commonly known as:  ULTRAM  Take one tablet by mouth every 4 hours as needed for moderate pain; Take two tablets by mouth every 4 hours as needed for severe pain     venlafaxine 37.5 MG tablet  Commonly known as:  EFFEXOR  Take 37.5 mg by mouth 2 (two) times daily.     VITAMIN B-12 PO  Take 1 tablet by mouth daily.        Prescriptions given: None give  Instructions: 1.  Resume Eliquis on Tuesday, 10/25/15 2.  Leave ace wrap in place for 4-5 days to help close space where hematoma was.   Disposition: SNF  Patient's condition: is Good  Follow up: 1. Dr. Kellie Simmering in 2 weeks   Leontine Locket, PA-C [facility] [phone] 10/21/2015  12:32 PM

## 2015-10-21 NOTE — Op Note (Signed)
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 Following this the patient was in 5000 units of heparin  And after adequate circulation time theright upper extremity was exsanguinated using has marked bandage with a tourniquet inflated to 250 mmHg. The 3 x 4 cm mass over the right radial artery just proximal the wrist was then examined longitudinal incision was made directly overlying the mass the hematoma was immediately entered and was easily evacuated. It was then noted in the depth of the wound the puncture site in the radial artery which was a very clean puncture site  With no evidence of injury to the radial artery. This was repaired with 2 6-0 Prolene sutures.. It was then removed and there was no arterial bleeding from the puncture site or otherwise. There was good radial and ulnar arterial flow following this maneuver and a palpable radial pulse distally. No protamine was given wound was closed with interrupted 3-0 Vicryl and 3-0 Vicryl in a subcuticular fashionfor the skin sterile compression dressing applied with 4 x 4's Kerlix and an Ace wrap patient taken to recovery room in satisfactory condition  Procedure Details:   Tinnie Gens, MD 10/21/2015 11:43 AM

## 2015-10-21 NOTE — Anesthesia Postprocedure Evaluation (Signed)
Anesthesia Post Note  Patient: [name]  Procedure(s) Performed: Procedure(s) (LRB): SUTURE REPAIR OF RADIAL ARTERY AND EVACUATION OF HEMATOMA (Right)  Patient location during evaluation: PACU Anesthesia Type: General Level of consciousness: awake and alert, oriented and patient cooperative Pain management: pain level controlled Vital Signs Assessment: post-procedure vital signs reviewed and stable Respiratory status: spontaneous breathing, nonlabored ventilation and respiratory function stable Cardiovascular status: blood pressure returned to baseline and stable Postop Assessment: no signs of nausea or vomiting Anesthetic complications: no    Last Vitals:  Filed Vitals:   10/21/15 1258 10/21/15 1300  BP: 125/63   Pulse: 77 75  Temp:    Resp: 15 14    Last Pain:  Filed Vitals:   10/21/15 1306  PainSc: Asleep                 Chenee Munns,E. Tinia Oravec

## 2015-10-21 NOTE — Anesthesia Preprocedure Evaluation (Signed)
Anesthesia Evaluation  Patient identified by MRN, date of birth, ID band Patient awake    Reviewed: Allergy & Precautions, NPO status , Patient's Chart, lab work & pertinent test results  History of Anesthesia Complications Negative for: history of anesthetic complications  Airway Mallampati: II  TM Distance: >3 FB Neck ROM: Full    Dental  (+) Dental Advisory Given   Pulmonary sleep apnea (no CPAP) , former smoker (quit 1962),    breath sounds clear to auscultation       Cardiovascular hypertension, Pt. on home beta blockers and Pt. on medications + CAD and + CABG (recent CABG and maze)  + dysrhythmias (recent maze) Atrial Fibrillation  Rhythm:Regular Rate:Normal  09/23/15 EF 50-55% on periop TEE for CABG   Neuro/Psych Anxiety    GI/Hepatic Neg liver ROS, GERD  Medicated and Controlled,  Endo/Other  Morbid obesity  Renal/GU negative Renal ROS     Musculoskeletal  (+) Arthritis , Osteoarthritis,  Fibromyalgia -  Abdominal (+) + obese,   Peds  Hematology eliquis   Anesthesia Other Findings   Reproductive/Obstetrics                             Anesthesia Physical Anesthesia Plan  ASA: III  Anesthesia Plan: General   Post-op Pain Management:    Induction: Intravenous  Airway Management Planned: LMA  Additional Equipment:   Intra-op Plan:   Post-operative Plan:   Informed Consent: I have reviewed the patients History and Physical, chart, labs and discussed the procedure including the risks, benefits and alternatives for the proposed anesthesia with the patient or authorized representative who has indicated his/her understanding and acceptance.   Dental advisory given  Plan Discussed with: CRNA and Surgeon  Anesthesia Plan Comments: (Plan routine monitors, GA- LMA OK)        Anesthesia Quick Evaluation

## 2015-10-21 NOTE — Interval H&P Note (Signed)
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 After consideration of risks, benefits and other options for treatment, the patient has consented to  Procedure(s): REPAIR OF RADIAL ARTERY PSEUDOANEURYSM (Right) as a surgical intervention .  The patient's history has been reviewed, patient examined, no change in status, stable for surgery.  I have reviewed the patient's chart and labs.  Questions were answered to the patient's satisfaction.     Tinnie Gens

## 2015-10-21 NOTE — Anesthesia Procedure Notes (Signed)
Procedure Name: LMA Insertion Date/Time: 10/21/2015 10:51 AM Performed by: Eligha Bridegroom Pre-anesthesia Checklist: Patient identified, Timeout performed, Emergency Drugs available and Suction available Patient Re-evaluated:Patient Re-evaluated prior to inductionOxygen Delivery Method: Circle system utilized Preoxygenation: Pre-oxygenation with 100% oxygen Intubation Type: IV induction LMA: LMA inserted LMA Size: 4.0 Number of attempts: 1 Placement Confirmation: positive ETCO2 and breath sounds checked- equal and bilateral Tube secured with: Tape Dental Injury: Teeth and Oropharynx as per pre-operative assessment

## 2015-10-21 NOTE — H&P (View-Only) (Signed)
Subjective:     Patient ID: [name], female   DOB: 03-17-1941, 75 y.o.   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 The patient had cardiac catheterization on 09/20/2015 by Dr. Leanora Cover subsequently coronary artery bypass grafting and maze procedure by Dr. Pia Mau on May 4. Patient has been on chronic Eliquis. She is currently at the rehabilitation facility[facility] and is due to be discharged neck week. She has had increasing-sized mass over the puncture site and her right radial artery which has become increasingly painful and has enlarged significantly over the last 3-4 days. Patient seems to be doing well following her cardiac surgery. She is on nasal oxygen. She has increasing her ambulation.  Past Medical History  Diagnosis Date  . Hyperlipidemia   . Fatigue   . Anxiety   . Coronary artery disease     a. s/p MI 2009 - PCI/DES distal  RCA w/ 2.75 x 18 Xience DES;  b. 05/2010 Myoview: Apical thinning, EF 73%;  c. 06/2010 Cath: moderate nonobs dzs, EF 65%;  d.  Lexiscan Myoview (07/2013):  No scar or ischemia, EF 67%; Normal Study  . Fibromyalgia   . Essential iris atrophy     Right side  . Menopause   . Glaucoma   . Environmental allergies   . GERD (gastroesophageal reflux disease)   . Osteoarthritis   . OSA (obstructive sleep apnea)   . Obesity (BMI 30-39.9)   . Hypertension     Social History  Substance Use Topics  . Smoking status: Former Smoker    Types: Cigarettes    Quit date: 09/13/1960  . Smokeless tobacco: Never Used  . Alcohol Use: No    Family History  Problem Relation Age of Onset  . Heart attack Mother   . Coronary artery disease Brother     had CABG  . Coronary artery disease Brother   . Coronary artery disease Brother   . Coronary artery disease Brother   . Coronary artery disease Brother   . Heart attack Brother   . Cancer Father     Allergies  Allergen Reactions  .  Cortisone Nausea Only and Other (See Comments)    Pt gets very hot and flushed  . Sulfamethoxazole-Trimethoprim Other (See Comments)    Reaction: unknown     Current outpatient prescriptions:  .  acetaminophen (TYLENOL) 500 MG tablet, Take 1,000 mg by mouth every 8 (eight) hours as needed for moderate pain., Disp: , Rfl:  .  amiodarone (PACERONE) 200 MG tablet, Take 1 tablet (200 mg total) by mouth daily., Disp: , Rfl:  .  apixaban (ELIQUIS) 5 MG TABS tablet, Take 1 tablet (5 mg total) by mouth 2 (two) times daily., Disp: 60 tablet, Rfl: 11 .  aspirin EC 81 MG EC tablet, Take 1 tablet (81 mg total) by mouth daily., Disp: , Rfl:  .  Cyanocobalamin (VITAMIN B-12 PO), Take 1 tablet by mouth daily., Disp: , Rfl:  .  ferrous sulfate 325 (65 FE) MG tablet, Take 325 mg by mouth every morning. , Disp: , Rfl:  .  furosemide (LASIX) 40 MG tablet, Take 40 mg by mouth daily., Disp: , Rfl:  .  lisinopril (PRINIVIL,ZESTRIL) 20 MG tablet, Take 1 tablet (20 mg total) by mouth daily., Disp: , Rfl:  .  metoprolol tartrate (LOPRESSOR) 25 MG tablet, Take 1 tablet (25 mg total) by mouth 2 (two) times daily., Disp: , Rfl:  .  nitrofurantoin (MACRODANTIN) 100 MG capsule, Take 100 mg by mouth daily. , Disp: , Rfl:  .  pantoprazole (PROTONIX) 40 MG tablet, Take 40 mg by mouth daily. , Disp: , Rfl:  .  polyethylene glycol (MIRALAX / GLYCOLAX) packet, Take 17 g by mouth daily., Disp: 14 each, Rfl: 0 .  potassium chloride (K-DUR) 10 MEQ tablet, TAKE 1 TABLET BY MOUTH DAILY (Patient taking differently: TAKE 10 MEQ BY MOUTH DAILY), Disp: 90 tablet, Rfl: 1 .  pramipexole (MIRAPEX) 1 MG tablet, Take 1 mg by mouth at bedtime. , Disp: , Rfl:  .  prednisoLONE acetate (PRED FORTE) 1 % ophthalmic suspension, INSTILL 1 DROP TO OD QID. START AFTER SURGERY, Disp: , Rfl: 0 .  rosuvastatin (CRESTOR) 20 MG tablet, Take 1 tablet (20 mg total) by mouth daily at 6 PM., Disp: , Rfl:  .  timolol (TIMOPTIC) 0.5 % ophthalmic solution,  INSTILL 1 DROP OD QAM, Disp: , Rfl: 0 .  traMADol (ULTRAM) 50 MG tablet, Take one tablet by mouth every 4 hours as needed for moderate pain; Take two tablets by mouth every 4 hours as needed for severe pain, Disp: 360 tablet, Rfl: 0 .  venlafaxine (EFFEXOR) 75 MG tablet, Take 37.5 mg by mouth 2 (two) times daily. , Disp: , Rfl:   Filed Vitals:   10/18/15 1415  BP: 123/62  Pulse: 97  Temp: 98 F (36.7 C)  Resp: 18  Height: 5\' 6"  (1.676 m)  Weight: 220 lb (99.791 kg)  SpO2: 98%    Body mass index is 35.53 kg/(m^2).            Review of Systems denies chest pain but does have significant dyspnea on exertion which is improving since she has been in rehabilitation. Biggest complaint is increasing pain and right wrist. No history of CVA. Denies lateralizing weakness, aphasia, amaurosis fugax, diplopia, blurred vision, or syncope. Other systems negative and complete review of systems    Objective:   Physical Exam BP 123/62 mmHg  Pulse 97  Temp(Src) 98 F (36.7 C)  Resp 18  Ht 5\' 6"  (1.676 m)  Wt 220 lb (99.791 kg)  BMI 35.53 kg/m2  SpO2 98%    Gen.-alert and oriented x3 in no apparent distress-on nasal oxygen-breathing is not labored HEENT normal for age Lungs no rhonchi or wheezing Cardiovascular regular rhythm no murmurs carotid pulses 3+ palpable no bruits audible-nicely healing median sternotomy incision Abdomen soft nontender no palpable masses Musculoskeletal free of  major deformities Skin clear -no rashes Neurologic normal Lower extremities 3+ femoral and dorsalis pedis pulses palpable bilaterally with no edema Right wrist has 3 x 3 cm firm tender mass over distal radial artery which is not pulsatile. 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 Liam Rogers

## 2015-10-21 NOTE — Progress Notes (Signed)
Report called to RN at [facility]. Discharge instructions reviewed with patient and daughter, both verbalized understanding with no questions at this time. Patient was transported back to facility via shuttle, VSS, on O2 2L and packet sent with patient.  Sharene Skeans, RN

## 2015-10-21 NOTE — Transfer of Care (Signed)
Immediate Anesthesia Transfer of Care Note  Patient: [name]  Procedure(s) Performed: Procedure(s): SUTURE REPAIR OF RADIAL ARTERY AND EVACUATION OF HEMATOMA (Right)  Patient Location: PACU  Anesthesia Type:General  Level of Consciousness: awake, alert  and oriented  Airway & Oxygen Therapy: Patient Spontanous Breathing and Patient connected to nasal cannula oxygen  Post-op Assessment: Report given to RN and Post -op Vital signs reviewed and stable  Post vital signs: Reviewed and stable  Last Vitals:  Filed Vitals:   10/21/15 0751 10/21/15 0754  BP: 137/50   Temp:  36.7 C  Resp: 18     Last Pain: There were no vitals filed for this visit.    Patients Stated Pain Goal: 3 ([id number])  Complications: No apparent anesthesia complications

## 2015-10-24 ENCOUNTER — Encounter (HOSPITAL_COMMUNITY): Payer: Self-pay | Admitting: Vascular Surgery

## 2015-10-27 ENCOUNTER — Telehealth: Payer: Self-pay | Admitting: Vascular Surgery

## 2015-10-27 ENCOUNTER — Encounter: Payer: Self-pay | Admitting: Vascular Surgery

## 2015-10-27 ENCOUNTER — Telehealth: Payer: Self-pay | Admitting: *Deleted

## 2015-10-27 MED ORDER — METOPROLOL TARTRATE 25 MG PO TABS: 12.5000 mg | ORAL_TABLET | Freq: Two times a day (BID) | ORAL | Status: DC

## 2015-10-27 NOTE — Telephone Encounter (Signed)
Ok to try lower dose of Metoprolol 12. 5 BID

## 2015-10-27 NOTE — Telephone Encounter (Signed)
-----   Message from Mena Goes, RN sent at 10/21/2015 12:47 PM EDT ----- Regarding: schedule   ----- Message -----    From: Gabriel Earing, PA-C    Sent: 10/21/2015  12:31 PM      To: [facility] Charge Pool  S/p SUTURE REPAIR OF RADIAL ARTERY AND EVACUATION OF HEMATOMA.  F/u with Dr. Kellie Simmering in 2 weeks.  Thanks, Aldona Bar

## 2015-10-27 NOTE — Telephone Encounter (Signed)
Sched appt 6/13 at 11:30. Ph# not accepting calls, mailed appt letter through regular mail.

## 2015-10-27 NOTE — Telephone Encounter (Signed)
I called [facility] and reviewed Dr. Elmarie Shiley advice to decrease metoprolol to 12.5 mg BID with Carmell Austria, patient's caretaker.  She thanked me for the call.

## 2015-10-27 NOTE — Telephone Encounter (Signed)
Received telephone call from [place].  Pt's metoprolol was increased recently to 25 mg BID d/t c/o rapid heart rate (10/17/15 see Tenny Craw telephone note).  Pt now c/o dizziness and nausea since the increase and would like to go back to the 12.5 mg BID dosing.  These symptoms occur mostly during her rehab therapy.   BP has been 116/75, 122/64, 132/68 and 129/69  HR has been 62, 76, 70 and 73. Advised will review with her MD and call back with instructions/orders.

## 2015-11-01 ENCOUNTER — Encounter: Payer: Self-pay | Admitting: Vascular Surgery

## 2015-11-01 ENCOUNTER — Ambulatory Visit (INDEPENDENT_AMBULATORY_CARE_PROVIDER_SITE_OTHER): Payer: Self-pay | Admitting: Vascular Surgery

## 2015-11-01 VITALS — BP 109/62 | HR 80 | Temp 96.9°F | Resp 16 | Ht 66.0 in | Wt 217.0 lb

## 2015-11-01 DIAGNOSIS — I721 Aneurysm of artery of upper extremity: Secondary | ICD-10-CM | POA: Insufficient documentation

## 2015-11-01 NOTE — Progress Notes (Signed)
Subjective:     Patient ID: [name], female   DOB: 17-Apr-1941, 75 y.o.   MRN: [id number]  HPI this 75 year old female returns for continued follow-up regarding her repair of the right radial pseudoaneurysm performed on 10/21/2015. Patient had had previous cardiac cath in that area prior to coronary artery bypass grafting. Patient is on chronic anticoagulation-Eliquis. She has no complaints regarding her surgical repair and incision in the right wrist. She denies pain or numbness in the right hand.   Review of Systems     Objective:   Physical Exam BP 109/62 mmHg  Pulse 80  Temp(Src) 96.9 F (36.1 C) (Oral)  Resp 16  Ht 5\' 6"  (1.676 m)  Wt 217 lb (98.431 kg)  BMI 35.04 kg/m2  SpO2 98%  Gen. well-developed well-nourished female no apparent distress alert and oriented 3 Right wrist incision has healed nicely. 3+ radial pulse palpable distally. No evidence of infection. Very minimal swelling noted.     Assessment:     Doing well post-suture repair of right radial artery following cardiac catheterization with hematoma    Plan:     Patient return to see a stone when necessary basis

## 2015-11-02 ENCOUNTER — Encounter: Payer: Self-pay | Admitting: Nurse Practitioner

## 2015-11-02 ENCOUNTER — Other Ambulatory Visit: Payer: Self-pay | Admitting: Cardiothoracic Surgery

## 2015-11-02 ENCOUNTER — Non-Acute Institutional Stay (SKILLED_NURSING_FACILITY): Payer: Medicare Other | Admitting: Nurse Practitioner

## 2015-11-02 DIAGNOSIS — I729 Aneurysm of unspecified site: Secondary | ICD-10-CM | POA: Diagnosis not present

## 2015-11-02 DIAGNOSIS — Z951 Presence of aortocoronary bypass graft: Secondary | ICD-10-CM | POA: Diagnosis not present

## 2015-11-02 DIAGNOSIS — K219 Gastro-esophageal reflux disease without esophagitis: Secondary | ICD-10-CM

## 2015-11-02 DIAGNOSIS — I480 Paroxysmal atrial fibrillation: Secondary | ICD-10-CM

## 2015-11-02 DIAGNOSIS — F411 Generalized anxiety disorder: Secondary | ICD-10-CM | POA: Diagnosis not present

## 2015-11-02 DIAGNOSIS — M25511 Pain in right shoulder: Secondary | ICD-10-CM | POA: Diagnosis not present

## 2015-11-02 DIAGNOSIS — D649 Anemia, unspecified: Secondary | ICD-10-CM

## 2015-11-02 NOTE — Progress Notes (Signed)
Nursing Home Location:  [facility]   Place of Service: SNF (31)  PCP: Donnie Coffin, MD  Allergies  Allergen Reactions  . Sulfamethoxazole-Trimethoprim Other (See Comments)    Reaction: unknown  . Cortisone Nausea Only and Other (See Comments)    Pt gets very hot and flushed    Chief Complaint  Patient presents with  . Discharge Note    Discharge from facility    HPI:  Patient is a 75 y.o. female seen today at [facility] for discharged home with home health. Pt is at [facility] after hospitalization. The patient had cardiac catheterization on 09/20/2015 by Dr. Leanora Cover subsequently coronary artery bypass grafting and maze procedure by Dr. Pia Mau on May 4. Pt d increasing-sized mass over the puncture site and her right radial artery which has become increasingly painful and has enlarged. 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 Pt has followed with vascular and doing well from this. Patient currently doing well with therapy, now stable to discharge home with home health.   Review of Systems:  Review of Systems  Constitutional: Negative for activity change, appetite change, fatigue and unexpected weight change.  HENT: Negative for congestion.   Eyes: Negative.   Respiratory: Negative for cough and shortness of breath.   Cardiovascular: Positive for leg swelling (minimal at this time). Negative for chest pain and palpitations.  Gastrointestinal: Negative for abdominal pain, diarrhea and constipation.  Genitourinary: Negative for dysuria and difficulty urinating.  Musculoskeletal: Positive for arthralgias (right shoulder pain which has improved). Negative for myalgias.  Skin: Negative for color change and wound.  Neurological: Negative for dizziness and weakness.  Psychiatric/Behavioral: Negative for behavioral problems, confusion and agitation.    Past Medical History  Diagnosis Date  .  Hyperlipidemia   . Fatigue   . Anxiety   . Coronary artery disease     a. s/p MI 2009 - PCI/DES distal  RCA w/ 2.75 x 18 Xience DES;  b. 05/2010 Myoview: Apical thinning, EF 73%;  c. 06/2010 Cath: moderate nonobs dzs, EF 65%;  d.  Lexiscan Myoview (07/2013):  No scar or ischemia, EF 67%; Normal Study  . Fibromyalgia   . Essential iris atrophy     Right side  . Menopause   . Glaucoma   . Environmental allergies   . GERD (gastroesophageal reflux disease)   . Osteoarthritis   . Obesity (BMI 30-39.9)   . Hypertension   . Oxygen dependent     3L  . OSA (obstructive sleep apnea)     does not wear CPAP   Past Surgical History  Procedure Laterality Date  . Cardiac catheterization  06/29/2010    Mild to moderate coronary artery irregularities.  Her  proximal left anterior descending artery is moderately narrowed.  She  also has an eccentric stenosis in the proximal LAD.  These do not appear  to obstruct flow at present, but they are fairly small vessels.  They  appeared to be between 1.5 and 2 mm in diamete  . Cardiac catheterization  08/02/2009    Patent stent  . Cardiac catheterization  05/12/2008    Stent to the distal RCA  . Cardiac catheterization N/A 09/20/2015    Procedure: Left Heart Cath and Coronary Angiography;  Surgeon: Jettie Booze, MD;  Location: [address] CV [facility];  Service: Cardiovascular;  Laterality: N/A;  . Cardiac catheterization  09/20/2015    Procedure:  Intravascular Ultrasound/IVUS;  Surgeon: Jettie Booze, MD;  Location: [place] CV [facility];  Service: Cardiovascular;;  . Cardiac catheterization  09/20/2015    Procedure: Intravascular Pressure Wire/FFR Study;  Surgeon: Jettie Booze, MD;  Location: [place] [facility];  Service: Cardiovascular;;  . Coronary artery bypass graft N/A 09/23/2015    Procedure: CORONARY ARTERY BYPASS GRAFTING (CABG) TIMES 1 USING LEFT INTERNAL MAMMARY TO THE LAD;  Surgeon: Grace Isaac, MD;  Location: [place];  Service: Open Heart  Surgery;  Laterality: N/A;  . Clipping of atrial appendage N/A 09/23/2015    Procedure:  CLIPPING OF ATRIAL APPENDAGE;  Surgeon: Grace Isaac, MD;  Location: [place];  Service: Open Heart Surgery;  Laterality: N/A;  . Tee without cardioversion N/A 09/23/2015    Procedure: TRANSESOPHAGEAL ECHOCARDIOGRAM (TEE);  Surgeon: Grace Isaac, MD;  Location: [place];  Service: Open Heart Surgery;  Laterality: N/A;  . Maze N/A 09/23/2015    Procedure: MAZE;  Surgeon: Grace Isaac, MD;  Location: [place];  Service: Open Heart Surgery;  Laterality: N/A;  . Resection of arteriovenous fistula aneurysm Right 10/21/2015    Procedure: SUTURE REPAIR OF RADIAL ARTERY AND EVACUATION OF HEMATOMA;  Surgeon: Mal Misty, MD;  Location: [place];  Service: Vascular;  Laterality: Right;   Social History:   reports that she quit smoking about 55 years ago. Her smoking use included Cigarettes. She has never used smokeless tobacco. She reports that she does not drink alcohol or use illicit drugs.  Family History  Problem Relation Age of Onset  . Heart attack Mother   . Coronary artery disease Brother     had CABG  . Coronary artery disease Brother   . Coronary artery disease Brother   . Coronary artery disease Brother   . Coronary artery disease Brother   . Heart attack Brother   . Cancer Father     Medications: Patient's Medications  New Prescriptions   No medications on file  Previous Medications   ACETAMINOPHEN (TYLENOL) 500 MG TABLET    Take 1,000 mg by mouth every 8 (eight) hours as needed for moderate pain.   AMIODARONE (PACERONE) 200 MG TABLET    Take 1 tablet (200 mg total) by mouth daily.   APIXABAN (ELIQUIS) 5 MG TABS TABLET    Take 1 tablet (5 mg total) by mouth 2 (two) times daily.   ASPIRIN EC 81 MG EC TABLET    Take 1 tablet (81 mg total) by mouth daily.   CYANOCOBALAMIN (VITAMIN B-12 PO)    Take 1 tablet by mouth daily.   FERROUS SULFATE 325 (65 FE) MG TABLET    Take 325 mg by mouth every  morning.    FUROSEMIDE (LASIX) 40 MG TABLET    Take 40 mg by mouth daily.   LISINOPRIL (PRINIVIL,ZESTRIL) 20 MG TABLET    Take 1 tablet (20 mg total) by mouth daily.   METOPROLOL TARTRATE (LOPRESSOR) 25 MG TABLET    Take 0.5 tablets (12.5 mg total) by mouth 2 (two) times daily.   NITROFURANTOIN (MACRODANTIN) 100 MG CAPSULE    Take 100 mg by mouth daily.    PANTOPRAZOLE (PROTONIX) 40 MG TABLET    Take 40 mg by mouth daily.    POLYETHYLENE GLYCOL (MIRALAX / GLYCOLAX) PACKET    Take 17 g by mouth daily.   POTASSIUM CHLORIDE (K-DUR) 10 MEQ TABLET    TAKE 1 TABLET BY MOUTH DAILY   PRAMIPEXOLE (MIRAPEX) 1 MG  TABLET    Take 1 mg by mouth at bedtime.    PREDNISOLONE ACETATE (PRED FORTE) 1 % OPHTHALMIC SUSPENSION    INSTILL 1 DROP TO OD QID. START AFTER SURGERY   ROSUVASTATIN (CRESTOR) 20 MG TABLET    Take 1 tablet (20 mg total) by mouth daily at 6 PM.   TIMOLOL (TIMOPTIC) 0.5 % OPHTHALMIC SOLUTION    INSTILL 1 DROP OD QAM   TRAMADOL (ULTRAM) 50 MG TABLET    Take one tablet by mouth every 4 hours as needed for moderate pain; Take two tablets by mouth every 4 hours as needed for severe pain   VENLAFAXINE (EFFEXOR) 37.5 MG TABLET    Take 37.5 mg by mouth 2 (two) times daily.  Modified Medications   No medications on file  Discontinued Medications   No medications on file     Physical Exam: Filed Vitals:   11/02/15 0929  BP: 132/70  Pulse: 76  Temp: 98.7 F (37.1 C)  TempSrc: Oral  Resp: 20  Height: 5\' 6"  (1.676 m)  Weight: 217 lb (98.431 kg)    Physical Exam  Constitutional: She is oriented to person, place, and time. She appears well-developed and well-nourished.  HENT:  Mouth/Throat: Oropharynx is clear and moist. No oropharyngeal exudate.  Eyes: Pupils are equal, round, and reactive to light. No scleral icterus.  Neck: Neck supple. Carotid bruit is not present. No tracheal deviation present. No thyromegaly present.  Cardiovascular: Normal rate, regular rhythm and intact distal pulses.   Exam reveals no gallop and no friction rub.   Murmur (1/6 SEM) heard. No LE edema b/l. no calf TTP.   Pulmonary/Chest: Effort normal and breath sounds normal. No stridor. No respiratory distress. She has no wheezes. She has no rales. She exhibits tenderness (midline incision healing well with no secondary signs of infection; healing well).  Abdominal: Soft. Bowel sounds are normal. She exhibits no distension and no mass. There is no hepatomegaly. There is no tenderness. There is no rebound and no guarding.  Musculoskeletal: She exhibits no edema or tenderness.  Lymphadenopathy:    She has no cervical adenopathy.  Neurological: She is alert and oriented to person, place, and time.  Skin: Skin is warm and dry. No rash noted.  Psychiatric: She has a normal mood and affect. Her behavior is normal. Thought content normal.    Labs reviewed: Basic Metabolic Panel:  Recent Labs  09/23/15 1855  09/24/15 0429  09/24/15 1645  09/29/15 0432 09/30/15 0414 10/05/15 10/13/15 1002 10/21/15 0756  NA  --   < > 134*  < >  --   < > 132* 132* 127* 135 137  K  --   < > 4.1  < >  --   < > 5.1 5.5* 5.1 4.3 4.2  CL  --   < > 102  < >  --   < > 95* 93*  --  99  --   CO2  --   --  23  --   --   < > 28 30  --  28  --   GLUCOSE  --   < > 110*  < >  --   < > 96 95  --  85 97  BUN  --   < > 11  < >  --   < > 34* 35* 20 12  --   CREATININE 0.85  < > 0.78  < > 1.04*  < > 1.31* 1.34* 1.1  1.12*  --   CALCIUM  --   --  8.4*  --   --   < > 8.7* 8.8*  --  9.0  --   MG 2.5*  --  2.1  --  1.8  --   --   --   --   --   --   < > = values in this interval not displayed. Liver Function Tests:  Recent Labs  07/29/15 1537 09/20/15 0005 09/23/15 0345  AST 49* 65* 89*  ALT 52* 66* 81*  ALKPHOS 77 74 77  BILITOT 0.4 0.5 0.6  PROT 8.2* 7.9 7.6  ALBUMIN 3.6 3.0* 2.8*   No results for input(s): LIPASE, AMYLASE in the last 8760 hours. No results for input(s): AMMONIA in the last 8760 hours. CBC:  Recent Labs   09/19/15 0432  09/25/15 0447  09/26/15 0438 09/27/15 0310 10/05/15 10/21/15 0756  WBC 5.8  < > 10.7*  --  10.0 11.1* 10.9  --   NEUTROABS 3.3  --   --   --   --   --   --   --   HGB 11.2*  < > 7.7*  < > 7.3* 7.7* 7.4* 11.6*  HCT 34.2*  < > 24.6*  < > 23.0* 24.4* 24* 34.0*  MCV 90.5  < > 91.4  --  90.9 92.1  --   --   PLT 195  < > 136*  --  144* 203 474*  --   < > = values in this interval not displayed. TSH:  Recent Labs  12/19/14 2049 09/19/15 1015  TSH 1.882 4.769*   A1C: Lab Results  Component Value Date   HGBA1C 5.8* 09/23/2015   Lipid Panel:  Recent Labs  07/29/15 1537  CHOL 220*  HDL 44*  LDLCALC 155*  TRIG 105  CHOLHDL 5.0     Assessment/Plan 1. S/P CABG x 1 -doing well with therapy. Minimal pain noted. conts on eliquis, metoprolol, lisinopril and lasix   2. Paroxysmal atrial fibrillation (HCC) SR at this time. conts on amiodarone and eliquis   3. Anxiety state Stable on effexor  4. Right shoulder pain Worse after CABG, now has improved significantly   5. Gastroesophageal reflux disease without esophagitis Stable on Protonix  6. Pseudoaneurysm [facility]) S/p repair, following with vascular surgery, site is healing well.   7. Anemia, unspecified anemia type -improved on recent labs, conts on iron daily  pt is stable for discharge-will need PT/OT/ per home health. DME needed includes rollator, 3n1, tub bench. Rx written.  will need to follow up with PCP within 2 weeks.     Carlos American. Harle Battiest  [facility] [phone] 8 am - 5 pm) [phone] (after hours)

## 2015-11-03 ENCOUNTER — Ambulatory Visit: Payer: Self-pay | Admitting: Cardiothoracic Surgery

## 2015-11-08 ENCOUNTER — Other Ambulatory Visit: Payer: Self-pay | Admitting: Nurse Practitioner

## 2015-11-10 DIAGNOSIS — M545 Low back pain: Secondary | ICD-10-CM | POA: Diagnosis not present

## 2015-11-10 DIAGNOSIS — R110 Nausea: Secondary | ICD-10-CM | POA: Diagnosis not present

## 2015-11-10 DIAGNOSIS — I10 Essential (primary) hypertension: Secondary | ICD-10-CM | POA: Diagnosis not present

## 2015-11-10 DIAGNOSIS — F411 Generalized anxiety disorder: Secondary | ICD-10-CM | POA: Diagnosis not present

## 2015-11-10 DIAGNOSIS — R42 Dizziness and giddiness: Secondary | ICD-10-CM | POA: Diagnosis not present

## 2015-11-10 DIAGNOSIS — I2510 Atherosclerotic heart disease of native coronary artery without angina pectoris: Secondary | ICD-10-CM | POA: Diagnosis not present

## 2015-11-10 DIAGNOSIS — R05 Cough: Secondary | ICD-10-CM | POA: Diagnosis not present

## 2015-11-12 ENCOUNTER — Encounter: Payer: Self-pay | Admitting: Cardiothoracic Surgery

## 2015-11-15 DIAGNOSIS — I10 Essential (primary) hypertension: Secondary | ICD-10-CM | POA: Diagnosis not present

## 2015-11-15 DIAGNOSIS — Z48812 Encounter for surgical aftercare following surgery on the circulatory system: Secondary | ICD-10-CM | POA: Diagnosis not present

## 2015-11-15 DIAGNOSIS — I4891 Unspecified atrial fibrillation: Secondary | ICD-10-CM | POA: Diagnosis not present

## 2015-11-15 DIAGNOSIS — M1990 Unspecified osteoarthritis, unspecified site: Secondary | ICD-10-CM | POA: Diagnosis not present

## 2015-11-15 DIAGNOSIS — M797 Fibromyalgia: Secondary | ICD-10-CM | POA: Diagnosis not present

## 2015-11-15 DIAGNOSIS — I2510 Atherosclerotic heart disease of native coronary artery without angina pectoris: Secondary | ICD-10-CM | POA: Diagnosis not present

## 2015-11-16 DIAGNOSIS — M1990 Unspecified osteoarthritis, unspecified site: Secondary | ICD-10-CM | POA: Diagnosis not present

## 2015-11-16 DIAGNOSIS — I2510 Atherosclerotic heart disease of native coronary artery without angina pectoris: Secondary | ICD-10-CM | POA: Diagnosis not present

## 2015-11-16 DIAGNOSIS — I10 Essential (primary) hypertension: Secondary | ICD-10-CM | POA: Diagnosis not present

## 2015-11-16 DIAGNOSIS — Z48812 Encounter for surgical aftercare following surgery on the circulatory system: Secondary | ICD-10-CM | POA: Diagnosis not present

## 2015-11-16 DIAGNOSIS — I4891 Unspecified atrial fibrillation: Secondary | ICD-10-CM | POA: Diagnosis not present

## 2015-11-16 DIAGNOSIS — M797 Fibromyalgia: Secondary | ICD-10-CM | POA: Diagnosis not present

## 2015-11-17 DIAGNOSIS — R42 Dizziness and giddiness: Secondary | ICD-10-CM | POA: Diagnosis not present

## 2015-11-17 DIAGNOSIS — K219 Gastro-esophageal reflux disease without esophagitis: Secondary | ICD-10-CM | POA: Diagnosis not present

## 2015-11-17 DIAGNOSIS — R5383 Other fatigue: Secondary | ICD-10-CM | POA: Diagnosis not present

## 2015-11-17 DIAGNOSIS — R748 Abnormal levels of other serum enzymes: Secondary | ICD-10-CM | POA: Diagnosis not present

## 2015-11-17 DIAGNOSIS — R945 Abnormal results of liver function studies: Secondary | ICD-10-CM | POA: Diagnosis not present

## 2015-11-17 DIAGNOSIS — R110 Nausea: Secondary | ICD-10-CM | POA: Diagnosis not present

## 2015-11-17 DIAGNOSIS — I10 Essential (primary) hypertension: Secondary | ICD-10-CM | POA: Diagnosis not present

## 2015-11-23 DIAGNOSIS — Z48812 Encounter for surgical aftercare following surgery on the circulatory system: Secondary | ICD-10-CM | POA: Diagnosis not present

## 2015-11-23 DIAGNOSIS — I2510 Atherosclerotic heart disease of native coronary artery without angina pectoris: Secondary | ICD-10-CM | POA: Diagnosis not present

## 2015-11-23 DIAGNOSIS — I10 Essential (primary) hypertension: Secondary | ICD-10-CM | POA: Diagnosis not present

## 2015-11-23 DIAGNOSIS — M797 Fibromyalgia: Secondary | ICD-10-CM | POA: Diagnosis not present

## 2015-11-23 DIAGNOSIS — I4891 Unspecified atrial fibrillation: Secondary | ICD-10-CM | POA: Diagnosis not present

## 2015-11-23 DIAGNOSIS — M1990 Unspecified osteoarthritis, unspecified site: Secondary | ICD-10-CM | POA: Diagnosis not present

## 2015-11-25 DIAGNOSIS — M797 Fibromyalgia: Secondary | ICD-10-CM | POA: Diagnosis not present

## 2015-11-25 DIAGNOSIS — I2510 Atherosclerotic heart disease of native coronary artery without angina pectoris: Secondary | ICD-10-CM | POA: Diagnosis not present

## 2015-11-25 DIAGNOSIS — I10 Essential (primary) hypertension: Secondary | ICD-10-CM | POA: Diagnosis not present

## 2015-11-25 DIAGNOSIS — I4891 Unspecified atrial fibrillation: Secondary | ICD-10-CM | POA: Diagnosis not present

## 2015-11-25 DIAGNOSIS — M1990 Unspecified osteoarthritis, unspecified site: Secondary | ICD-10-CM | POA: Diagnosis not present

## 2015-11-25 DIAGNOSIS — Z48812 Encounter for surgical aftercare following surgery on the circulatory system: Secondary | ICD-10-CM | POA: Diagnosis not present

## 2015-11-28 ENCOUNTER — Other Ambulatory Visit: Payer: Self-pay | Admitting: Nurse Practitioner

## 2015-11-30 DIAGNOSIS — I4891 Unspecified atrial fibrillation: Secondary | ICD-10-CM | POA: Diagnosis not present

## 2015-11-30 DIAGNOSIS — M797 Fibromyalgia: Secondary | ICD-10-CM | POA: Diagnosis not present

## 2015-11-30 DIAGNOSIS — I10 Essential (primary) hypertension: Secondary | ICD-10-CM | POA: Diagnosis not present

## 2015-11-30 DIAGNOSIS — I2510 Atherosclerotic heart disease of native coronary artery without angina pectoris: Secondary | ICD-10-CM | POA: Diagnosis not present

## 2015-11-30 DIAGNOSIS — Z48812 Encounter for surgical aftercare following surgery on the circulatory system: Secondary | ICD-10-CM | POA: Diagnosis not present

## 2015-11-30 DIAGNOSIS — M1990 Unspecified osteoarthritis, unspecified site: Secondary | ICD-10-CM | POA: Diagnosis not present

## 2015-12-02 DIAGNOSIS — R5383 Other fatigue: Secondary | ICD-10-CM | POA: Diagnosis not present

## 2015-12-02 DIAGNOSIS — L0390 Cellulitis, unspecified: Secondary | ICD-10-CM | POA: Diagnosis not present

## 2015-12-02 DIAGNOSIS — K219 Gastro-esophageal reflux disease without esophagitis: Secondary | ICD-10-CM | POA: Diagnosis not present

## 2015-12-02 DIAGNOSIS — I10 Essential (primary) hypertension: Secondary | ICD-10-CM | POA: Diagnosis not present

## 2015-12-02 DIAGNOSIS — F411 Generalized anxiety disorder: Secondary | ICD-10-CM | POA: Diagnosis not present

## 2015-12-02 DIAGNOSIS — M797 Fibromyalgia: Secondary | ICD-10-CM | POA: Diagnosis not present

## 2015-12-02 DIAGNOSIS — I4891 Unspecified atrial fibrillation: Secondary | ICD-10-CM | POA: Diagnosis not present

## 2015-12-02 DIAGNOSIS — I2510 Atherosclerotic heart disease of native coronary artery without angina pectoris: Secondary | ICD-10-CM | POA: Diagnosis not present

## 2015-12-02 DIAGNOSIS — M1990 Unspecified osteoarthritis, unspecified site: Secondary | ICD-10-CM | POA: Diagnosis not present

## 2015-12-02 DIAGNOSIS — Z48812 Encounter for surgical aftercare following surgery on the circulatory system: Secondary | ICD-10-CM | POA: Diagnosis not present

## 2015-12-05 DIAGNOSIS — I10 Essential (primary) hypertension: Secondary | ICD-10-CM | POA: Diagnosis not present

## 2015-12-05 DIAGNOSIS — I2510 Atherosclerotic heart disease of native coronary artery without angina pectoris: Secondary | ICD-10-CM | POA: Diagnosis not present

## 2015-12-05 DIAGNOSIS — I4891 Unspecified atrial fibrillation: Secondary | ICD-10-CM | POA: Diagnosis not present

## 2015-12-05 DIAGNOSIS — M797 Fibromyalgia: Secondary | ICD-10-CM | POA: Diagnosis not present

## 2015-12-05 DIAGNOSIS — M1990 Unspecified osteoarthritis, unspecified site: Secondary | ICD-10-CM | POA: Diagnosis not present

## 2015-12-05 DIAGNOSIS — Z48812 Encounter for surgical aftercare following surgery on the circulatory system: Secondary | ICD-10-CM | POA: Diagnosis not present

## 2015-12-07 DIAGNOSIS — Z48812 Encounter for surgical aftercare following surgery on the circulatory system: Secondary | ICD-10-CM | POA: Diagnosis not present

## 2015-12-07 DIAGNOSIS — M1990 Unspecified osteoarthritis, unspecified site: Secondary | ICD-10-CM | POA: Diagnosis not present

## 2015-12-07 DIAGNOSIS — M797 Fibromyalgia: Secondary | ICD-10-CM | POA: Diagnosis not present

## 2015-12-07 DIAGNOSIS — I2510 Atherosclerotic heart disease of native coronary artery without angina pectoris: Secondary | ICD-10-CM | POA: Diagnosis not present

## 2015-12-07 DIAGNOSIS — I10 Essential (primary) hypertension: Secondary | ICD-10-CM | POA: Diagnosis not present

## 2015-12-07 DIAGNOSIS — I4891 Unspecified atrial fibrillation: Secondary | ICD-10-CM | POA: Diagnosis not present

## 2015-12-11 ENCOUNTER — Other Ambulatory Visit: Payer: Self-pay | Admitting: Cardiovascular Disease

## 2015-12-22 ENCOUNTER — Ambulatory Visit: Payer: Medicare Other | Admitting: Cardiothoracic Surgery

## 2015-12-26 ENCOUNTER — Ambulatory Visit (INDEPENDENT_AMBULATORY_CARE_PROVIDER_SITE_OTHER): Payer: Self-pay | Admitting: Physician Assistant

## 2015-12-26 ENCOUNTER — Ambulatory Visit
Admission: RE | Admit: 2015-12-26 | Discharge: 2015-12-26 | Disposition: A | Discharge status: Home / Self Care | Payer: Medicare Other | Source: Ambulatory Visit | Attending: Cardiothoracic Surgery | Admitting: Cardiothoracic Surgery

## 2015-12-26 VITALS — BP 138/69 | HR 92 | Resp 20 | Ht 66.0 in | Wt 215.0 lb

## 2015-12-26 DIAGNOSIS — J9811 Atelectasis: Secondary | ICD-10-CM | POA: Diagnosis not present

## 2015-12-26 DIAGNOSIS — Z951 Presence of aortocoronary bypass graft: Secondary | ICD-10-CM

## 2015-12-26 NOTE — Progress Notes (Signed)
HPI:  Patient returns for routine postoperative follow-up having undergone CABG x 1 in May of this year. The patient's early postoperative recovery while in the hospital was notable for deconditioning requiring SNF placement.  Since hospital discharge the patient reports she is doing okay.  She states she has constant fatigue which was present prior to surgery.  She states she is able to care for herself, but cant go much further than her mailbox without getting really tired.  She also complains of being depressed.  She states she cant stand herself at times and just feels really down.  She is currently on Effexor but states she does not think its helping.  She denies suicidal thoughts and does not want to harm herself.  She otherwise has no issues.  She states her incisions are well healed.     Current Outpatient Prescriptions  Medication Sig Dispense Refill  . acetaminophen (TYLENOL) 500 MG tablet Take 1,000 mg by mouth every 8 (eight) hours as needed for moderate pain.    Marland Kitchen amiodarone (PACERONE) 200 MG tablet TAKE 1 TABLET BY MOUTH DAILY 30 tablet 0  . apixaban (ELIQUIS) 5 MG TABS tablet Take 1 tablet (5 mg total) by mouth 2 (two) times daily.    Marland Kitchen aspirin EC 81 MG EC tablet Take 1 tablet (81 mg total) by mouth daily.    . Cyanocobalamin (VITAMIN B-12 PO) Take 1 tablet by mouth daily.    . ferrous sulfate 325 (65 FE) MG tablet Take 325 mg by mouth every morning.     . furosemide (LASIX) 40 MG tablet Take 40 mg by mouth daily.    Marland Kitchen lisinopril (PRINIVIL,ZESTRIL) 40 MG tablet TAKE 1 TABLET(40 MG) BY MOUTH EVERY MORNING 90 tablet 0  . metoprolol tartrate (LOPRESSOR) 25 MG tablet Take 0.5 tablets (12.5 mg total) by mouth 2 (two) times daily.    . nitrofurantoin (MACRODANTIN) 100 MG capsule Take 100 mg by mouth daily.     . pantoprazole (PROTONIX) 40 MG tablet Take 40 mg by mouth daily.     . polyethylene glycol (MIRALAX / GLYCOLAX) packet Take 17 g by mouth daily. 14 each 0  . potassium chloride  (K-DUR) 10 MEQ tablet TAKE 1 TABLET BY MOUTH DAILY 90 tablet 1  . pramipexole (MIRAPEX) 1 MG tablet Take 1 mg by mouth at bedtime.     . prednisoLONE acetate (PRED FORTE) 1 % ophthalmic suspension INSTILL 1 DROP TO OD QID. START AFTER SURGERY  0  . traMADol (ULTRAM) 50 MG tablet Take one tablet by mouth every 4 hours as needed for moderate pain; Take two tablets by mouth every 4 hours as needed for severe pain 360 tablet 0  . venlafaxine (EFFEXOR) 37.5 MG tablet Take 37.5 mg by mouth 2 (two) times daily.     No current facility-administered medications for this visit.     Physical Exam:  BP 138/69 (BP Location: Left Arm, Patient Position: Sitting, Cuff Size: Large)   Pulse 92   Resp 20   Ht 5\' 6"  (1.676 m)   Wt 215 lb (97.5 kg)   SpO2 98% Comment: RA  BMI 34.70 kg/m   Gen: no apparent distress Heart: RRR Lungs: CTA bilaterally Skin: sternotomy well healed  Diagnostic Tests:  CXR: sternal wires intact, no pleural effusion, pneumothorax present  A/P:  1. S/P CABG doing very well... Do activity as tolerated and is back to baseline from prior to surgery 2. Depression- told patient not uncommon post surgery.  Currently  on Effexor which she feels is no longer working.  Instructed patient to follow up with Primary physician who should adjusted dosage vs. Start of different anti-depression medication 3. Dispo- continue follow up care with Cardiology, RTC prn  Ellwood Handler, PA-C [facility] [phone]

## 2016-01-05 ENCOUNTER — Encounter: Payer: Self-pay | Admitting: Cardiovascular Disease

## 2016-01-11 ENCOUNTER — Encounter: Payer: Self-pay | Admitting: Cardiovascular Disease

## 2016-01-16 ENCOUNTER — Other Ambulatory Visit: Payer: Self-pay | Admitting: Nurse Practitioner

## 2016-01-16 ENCOUNTER — Other Ambulatory Visit: Payer: Self-pay | Admitting: *Deleted

## 2016-01-16 MED ORDER — POTASSIUM CHLORIDE ER 10 MEQ PO TBCR: 10.0000 meq | EXTENDED_RELEASE_TABLET | Freq: Every day | ORAL | 1 refills | Status: DC

## 2016-01-24 ENCOUNTER — Other Ambulatory Visit: Payer: Self-pay | Admitting: Cardiovascular Disease

## 2016-02-02 ENCOUNTER — Ambulatory Visit: Payer: Medicare Other | Admitting: Cardiovascular Disease

## 2016-02-08 ENCOUNTER — Encounter: Payer: Self-pay | Admitting: Cardiovascular Disease
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
## 2016-02-23 ENCOUNTER — Encounter: Payer: Self-pay | Admitting: Cardiovascular Disease

## 2016-02-23 ENCOUNTER — Ambulatory Visit (INDEPENDENT_AMBULATORY_CARE_PROVIDER_SITE_OTHER): Payer: Medicare Other | Admitting: Cardiovascular Disease

## 2016-02-23 VITALS — BP 96/54 | HR 84 | Ht 66.0 in | Wt 222.2 lb

## 2016-02-23 DIAGNOSIS — I2510 Atherosclerotic heart disease of native coronary artery without angina pectoris: Secondary | ICD-10-CM

## 2016-02-23 DIAGNOSIS — I10 Essential (primary) hypertension: Secondary | ICD-10-CM | POA: Diagnosis not present

## 2016-02-23 DIAGNOSIS — I480 Paroxysmal atrial fibrillation: Secondary | ICD-10-CM

## 2016-02-23 MED ORDER — LISINOPRIL 20 MG PO TABS: 20.0000 mg | ORAL_TABLET | Freq: Every day | ORAL | 11 refills | Status: DC

## 2016-02-23 MED ORDER — METOPROLOL TARTRATE 25 MG PO TABS: 25.0000 mg | ORAL_TABLET | Freq: Two times a day (BID) | ORAL | 11 refills | Status: DC

## 2016-02-23 MED ORDER — LISINOPRIL 20 MG PO TABS: 20.0000 mg | ORAL_TABLET | Freq: Every day | ORAL | 3 refills | Status: DC

## 2016-02-23 MED ORDER — NITROGLYCERIN 0.4 MG SL SUBL: 0.4000 mg | SUBLINGUAL_TABLET | SUBLINGUAL | 6 refills | Status: DC | PRN

## 2016-02-23 MED ORDER — METOPROLOL TARTRATE 25 MG PO TABS: 25.0000 mg | ORAL_TABLET | Freq: Two times a day (BID) | ORAL | 3 refills | Status: DC

## 2016-02-23 NOTE — Patient Instructions (Signed)
Medication Instructions:  TAKE Metoprolol (Lopressor) 25 mg twice daily  STOP Amiodarone DECREASE Lisinopril to 20 mg once daily   Labwork: None Ordered   Testing/Procedures: None Ordered   Follow-Up: Your physician recommends that you schedule a follow-up appointment in: 6 weeks with Dr. Acie Fredrickson   If you need a refill on your cardiac medications before your next appointment, please call your pharmacy.   Thank you for choosing [facility]! Christen Bame, RN [phone]

## 2016-02-23 NOTE — Addendum Note (Signed)
Addended by: Emmaline Life on: 02/23/2016 05:08 PM   Modules accepted: Orders

## 2016-02-23 NOTE — Progress Notes (Signed)
Patient Name: [name] Date of Encounter: 02/23/2016  Primary Care Provider:  Donnie Coffin, MD Primary Cardiologist:  Joaquim Nam, MD  Problem List  1. CAD - s/p CABG Sep 22, 2015.  2. Hyperlipidemia 3. Obstructive sleep apnea 4. Hypertension 5. Atrial fib    April 65, 5022:  75 year old female with the above problem list.  She was in usual state of health about 3 weeks ago when she began to note mild lower extremity edema with bilateral ankle and calf pain with ambulation.  She takes her left leg has been more swollen than the right.  She has been relatively active recently partaking and gardening without any chest pain or dyspnea.  Her weight is actually down from baseline.  She's seen no change in her salt intake or amt of time that her legs might be in a dependent position.  She's quite flustered and anxious today.  She misplaced her phone and her dtr initially dropped her off @ the old [place] cardiology office and she had to walk down the street to here.  She doesn't know how to reach her dtr to have her come down and pick her up.  She's tearful and emotionally upset.  In that setting, after the visit, pt said that she thinks she's having chest pain.  An ecg was done, and before it was even completed, c/p resolved, as did her emotional upset.  Oct 08, 2012:  [name] is doing well. She's not had any episodes of chest pain or shortness of breath. She was seen by our PA last year. She has a history of coronary artery disease but has not had any recurrent angina. She's been working out in her garden without difficulties.  She has occasional episodes of orthostatic hypotension and also notes some occasional leg edema. I suspect this is from the amlodipine.  October 20, 2013:  [name] has seen [name] and was referred to [name] for management of her sleep apnea.   Her BP has been well controlled.  She needs to have a special dental appliance made for her OSA- did not tolerate the  CPAP. She has chronic dyspnea. No CP   She has found to have some thyroid issues.  Sees Dr. Alroy Dust for this.     December 29, 2014:  [name] is seen back today for evaulation of her CAD and HTN  And a recent episode of atrial fib. She was admitted and converted back to normal  ( ? No CP , BP has been well controlled    July 29, 2015:  Has chronic fatigue  Has had thyroid issues Doing well from a cardiac standpoint   Oct. 5, 2017:  [name] presented to the hospital with CP Sep 19, 2015. Cath showed prox LAD disease that was not amenable to PCI [name] had CABG  And MAZE procedure Sep 22, 2015. Complicated by a right radial hematoma - was eventually fixed on October 21, 2015     Past Medical History:  Diagnosis Date  . Anxiety   . Coronary artery disease    a. s/p MI 2009 - PCI/DES distal  RCA w/ 2.75 x 18 Xience DES;  b. 05/2010 Myoview: Apical thinning, EF 73%;  c. 06/2010 Cath: moderate nonobs dzs, EF 65%;  d.  Lexiscan Myoview (07/2013):  No scar or ischemia, EF 67%; Normal Study  . Environmental allergies   . Essential iris atrophy    Right side  . Fatigue   . Fibromyalgia   .  GERD (gastroesophageal reflux disease)   . Glaucoma   . Hyperlipidemia   . Hypertension   . Menopause   . Obesity (BMI 30-39.9)   . OSA (obstructive sleep apnea)    does not wear CPAP  . Osteoarthritis   . Oxygen dependent    3L   Past Surgical History:  Procedure Laterality Date  . CARDIAC CATHETERIZATION  06/29/2010   Mild to moderate coronary artery irregularities.  Her  proximal left anterior descending artery is moderately narrowed.  She  also has an eccentric stenosis in the proximal LAD.  These do not appear  to obstruct flow at present, but they are fairly small vessels.  They  appeared to be between 1.5 and 2 mm in diamete  . CARDIAC CATHETERIZATION  08/02/2009   Patent stent  . CARDIAC CATHETERIZATION  05/12/2008   Stent to the distal RCA  . CARDIAC CATHETERIZATION N/A 09/20/2015   Procedure:  Left Heart Cath and Coronary Angiography;  Surgeon: Jettie Booze, MD;  Location: [facility];  Service: Cardiovascular;  Laterality: N/A;  . CARDIAC CATHETERIZATION  09/20/2015   Procedure: Intravascular Ultrasound/IVUS;  Surgeon: Jettie Booze, MD;  Location: [facility];  Service: Cardiovascular;;  . CARDIAC CATHETERIZATION  09/20/2015   Procedure: Intravascular Pressure Wire/FFR Study;  Surgeon: Jettie Booze, MD;  Location: [facility];  Service: Cardiovascular;;  . CLIPPING OF ATRIAL APPENDAGE N/A 09/23/2015   Procedure:  CLIPPING OF ATRIAL APPENDAGE;  Surgeon: Grace Isaac, MD;  Location: [facility];  Service: Open Heart Surgery;  Laterality: N/A;  . CORONARY ARTERY BYPASS GRAFT N/A 09/23/2015   Procedure: CORONARY ARTERY BYPASS GRAFTING (CABG) TIMES 1 USING LEFT INTERNAL MAMMARY TO THE LAD;  Surgeon: Grace Isaac, MD;  Location: [facility];  Service: Open Heart Surgery;  Laterality: N/A;  . MAZE N/A 09/23/2015   Procedure: MAZE;  Surgeon: Grace Isaac, MD;  Location: [facility];  Service: Open Heart Surgery;  Laterality: N/A;  . RESECTION OF ARTERIOVENOUS FISTULA ANEURYSM Right 10/21/2015   Procedure: SUTURE REPAIR OF RADIAL ARTERY AND EVACUATION OF HEMATOMA;  Surgeon: Mal Misty, MD;  Location: [facility];  Service: Vascular;  Laterality: Right;  . TEE WITHOUT CARDIOVERSION N/A 09/23/2015   Procedure: TRANSESOPHAGEAL ECHOCARDIOGRAM (TEE);  Surgeon: Grace Isaac, MD;  Location: [facility];  Service: Open Heart Surgery;  Laterality: N/A;    Allergies  Allergies  Allergen Reactions  . Sulfamethoxazole-Trimethoprim Other (See Comments)    Reaction: unknown  . Cortisone Nausea Only and Other (See Comments)    Pt gets very hot and flushed      Home Medications  Prior to Admission medications   Medication Sig Start Date End Date Taking? Authorizing Provider  acetaminophen (TYLENOL) 500 MG tablet Take 500 mg by mouth as needed.    Yes Historical Provider, MD    amitriptyline (ELAVIL) 10 MG tablet Take 10 mg by mouth at bedtime.   Yes Historical Provider, MD  amLODipine (NORVASC) 5 MG tablet Take 1 tablet (5 mg total) by mouth daily. 09/03/11 09/02/12 Yes Thayer Headings, MD  aspirin 325 MG tablet Take 325 mg by mouth daily.     Yes Historical Provider, MD  carvedilol (COREG) 12.5 MG tablet Take 1 tablet (12.5 mg total) by mouth 2 (two) times daily. 09/03/11 09/02/12 Yes Thayer Headings, MD  clopidogrel (PLAVIX) 75 MG tablet TAKE ONE TABLET BY MOUTH DAILY 07/06/11  Yes Burtis Junes, NP  diclofenac  sodium (VOLTAREN) 1 % GEL Apply topically. **CREAM** use as directed    Yes Historical Provider, MD  fluticasone (FLONASE) 50 MCG/ACT nasal spray Place 1 spray into the nose daily.   Yes Historical Provider, MD  hydrochlorothiazide 25 MG tablet Take 25 mg by mouth daily.    Yes Historical Provider, MD  HYDROcodone-homatropine (HYCODAN) 5-1.5 MG/5ML syrup Take 5 mLs by mouth every 8 (eight) hours as needed for cough. 08/29/11 09/08/11 Yes Robyn Haber, MD  IRON PO Take 1 tablet by mouth daily.     Yes Historical Provider, MD  levofloxacin (LEVAQUIN) 500 MG tablet Take 1 tablet (500 mg total) by mouth daily. 08/29/11 09/08/11 Yes Robyn Haber, MD  methimazole (TAPAZOLE) 5 MG tablet Take 5 mg by mouth daily.   Yes Historical Provider, MD  nitroGLYCERIN (NITROSTAT) 0.4 MG SL tablet Place 0.4 mg under the tongue every 5 (five) minutes as needed.     Yes Historical Provider, MD  pantoprazole (PROTONIX) 40 MG tablet Take 40 mg by mouth 2 (two) times daily.    Yes Historical Provider, MD  potassium chloride (KLOR-CON) 10 MEQ CR tablet Take 1 tablet (10 mEq total) by mouth daily. 02/23/11  Yes Thayer Headings, MD  pramipexole (MIRAPEX) 0.25 MG tablet Take 1 mg by mouth at bedtime.    Yes Historical Provider, MD  rosuvastatin (CRESTOR) 10 MG tablet Take 1 tablet (10 mg total) by mouth daily. 06/06/11  Yes Thayer Headings, MD  traMADol (ULTRAM) 50 MG tablet Take 50 mg by  mouth 3 (three) times daily as needed.    Yes Historical Provider, MD  venlafaxine (EFFEXOR) 75 MG tablet Take 75 mg by mouth daily. 1/2 tablet 2 (two) times daily   Yes Historical Provider, MD  lisinopril (PRINIVIL,ZESTRIL) 40 MG tablet Take 1 tablet (40 mg total) by mouth daily. 09/07/11 09/06/12  Thayer Headings, MD     Physical Exam  Blood pressure (!) 96/54, pulse 84, height 5\' 6"  (1.676 m), weight 222 lb 3.2 oz (100.8 kg).  General: Pleasant, NAD Psych: Normal affect. Neuro: Alert and oriented X 3. Moves all extremities spontaneously. HEENT: Normal  Neck: Supple without bruits or JVD. Lungs:  Resp regular and unlabored, CTA. Heart: RRR no s3, s4. 1/6 sem lusb. Abdomen: Soft, non-tender, non-distended, BS + x 4.  Extremities: No clubbing, cyanosis or edema.  Marland Kitchen DP/PT/Radials 2+ and equal bilaterally.  ECG   Assessment and Plan    1. CAD - she's not having any episodes of chest discomfort. Her Plavix has been stopped because she's now on Eliquis. Continue current medications.  2. Hyperlipidemia - continue current medications. We'll plan on checking lipids again today   3. Obstructive sleep apnea-  Needs to get back to see her doctor who has been managing this   4. Hypertension -BP is a bit low and we need to start metoprolol. Will decrease the Lisinopril to 20 mg a day    5. Atrial fib - Continue Eliquis.   She had a MAZE procedure with her surgery . She has stopped the amiodarone  Already  - will take if off the list   Will see her in 6 weeks     Mertie Moores, MD  02/23/2016 3:[address],  [place], [place]  [address] Pager [phone] Phone: [phone]; Fax: [phone]

## 2016-03-07 ENCOUNTER — Other Ambulatory Visit: Payer: Self-pay | Admitting: Cardiovascular Disease

## 2016-03-07 MED ORDER — METOPROLOL TARTRATE 25 MG PO TABS: 25.0000 mg | ORAL_TABLET | Freq: Two times a day (BID) | ORAL | 3 refills | Status: DC

## 2016-03-07 MED ORDER — FUROSEMIDE 40 MG PO TABS: 40.0000 mg | ORAL_TABLET | Freq: Every day | ORAL | 3 refills | Status: DC

## 2016-03-07 MED ORDER — LISINOPRIL 20 MG PO TABS: 20.0000 mg | ORAL_TABLET | Freq: Every day | ORAL | 3 refills | Status: DC

## 2016-03-07 MED ORDER — POTASSIUM CHLORIDE ER 10 MEQ PO TBCR: 10.0000 meq | EXTENDED_RELEASE_TABLET | Freq: Every day | ORAL | 3 refills | Status: DC

## 2016-03-07 MED ORDER — APIXABAN 5 MG PO TABS: ORAL_TABLET | ORAL | 3 refills | Status: DC

## 2016-04-04 ENCOUNTER — Encounter: Payer: Self-pay | Admitting: Cardiovascular Disease

## 2016-04-04 ENCOUNTER — Ambulatory Visit (INDEPENDENT_AMBULATORY_CARE_PROVIDER_SITE_OTHER): Payer: Medicare Other | Admitting: Cardiovascular Disease

## 2016-04-04 VITALS — BP 150/60 | HR 80

## 2016-04-04 DIAGNOSIS — I2510 Atherosclerotic heart disease of native coronary artery without angina pectoris: Secondary | ICD-10-CM | POA: Diagnosis not present

## 2016-04-04 DIAGNOSIS — I25810 Atherosclerosis of coronary artery bypass graft(s) without angina pectoris: Secondary | ICD-10-CM

## 2016-04-04 DIAGNOSIS — I10 Essential (primary) hypertension: Secondary | ICD-10-CM | POA: Diagnosis not present

## 2016-04-04 NOTE — Progress Notes (Signed)
Patient Name: [name] Date of Encounter: 04/04/2016  Primary Care Provider:  Donnie Coffin, MD Primary Cardiologist:  Claudia Nam, MD  Problem List  1. CAD - s/p CABG Sep 22, 2015.  2. Hyperlipidemia 3. Obstructive sleep apnea 4. Hypertension 5. Atrial fib    April 52, 3666:  75 year old female with the above problem list.  She was in usual state of health about 3 weeks ago when she began to note mild lower extremity edema with bilateral ankle and calf pain with ambulation.  She takes her left leg has been more swollen than the right.  She has been relatively active recently partaking and gardening without any chest pain or dyspnea.  Her weight is actually down from baseline.  She's seen no change in her salt intake or amt of time that her legs might be in a dependent position.  She's quite flustered and anxious today.  She misplaced her phone and her dtr initially dropped her off @ the old [facility] cardiology office and she had to walk down the street to here.  She doesn't know how to reach her dtr to have her come down and pick her up.  She's tearful and emotionally upset.  In that setting, after the visit, pt said that she thinks she's having chest pain.  An ecg was done, and before it was even completed, c/p resolved, as did her emotional upset.  Oct 08, 2012:  [name] is doing well. She's not had any episodes of chest pain or shortness of breath. She was seen by our PA last year. She has a history of coronary artery disease but has not had any recurrent angina. She's been working out in her garden without difficulties.  She has occasional episodes of orthostatic hypotension and also notes some occasional leg edema. I suspect this is from the amlodipine.  October 20, 2013:  [name] has seen [name] and was referred to [name] for management of her sleep apnea.   Her BP has been well controlled.  She needs to have a special dental appliance made for her OSA- did not tolerate the  CPAP. She has chronic dyspnea. No CP   She has found to have some thyroid issues.  Sees [name] for this.     December 29, 2014:  [name] is seen back today for evaulation of her CAD and HTN  And a recent episode of atrial fib. She was admitted and converted back to normal  ( ? No CP , BP has been well controlled    July 29, 2015:  Has chronic fatigue  Has had thyroid issues Doing well from a cardiac standpoint   Oct. 5, 2017:  [name] presented to the hospital with CP Sep 19, 2015. Cath showed prox LAD disease that was not amenable to PCI [name] had CABG  And MAZE procedure Sep 22, 2015. Complicated by a right radial hematoma - was eventually fixed on October 21, 2015   Nov. 15, 2017  [name] is doing well. She had coronary artery bypass grafting in May 2017. She has a history of atrial fibrillation and also had a MAZE  procedure with the coronary artery bypass grafting. We stopped her amiodarone at her last visit.   Past Medical History:  Diagnosis Date  . Anxiety   . Coronary artery disease    a. s/p MI 2009 - PCI/DES distal  RCA w/ 2.75 x 18 Xience DES;  b. 05/2010 Myoview: Apical thinning, EF 73%;  c. 06/2010 Cath: moderate nonobs dzs, EF 65%;  d.  Lexiscan Myoview (07/2013):  No scar or ischemia, EF 67%; Normal Study  . Environmental allergies   . Essential iris atrophy    Right side  . Fatigue   . Fibromyalgia   . GERD (gastroesophageal reflux disease)   . Glaucoma   . Hyperlipidemia   . Hypertension   . Menopause   . Obesity (BMI 30-39.9)   . OSA (obstructive sleep apnea)    does not wear CPAP  . Osteoarthritis   . Oxygen dependent    3L   Past Surgical History:  Procedure Laterality Date  . CARDIAC CATHETERIZATION  06/29/2010   Mild to moderate coronary artery irregularities.  Her  proximal left anterior descending artery is moderately narrowed.  She  also has an eccentric stenosis in the proximal LAD.  These do not appear  to obstruct flow at present, but they are  fairly small vessels.  They  appeared to be between 1.5 and 2 mm in diamete  . CARDIAC CATHETERIZATION  08/02/2009   Patent stent  . CARDIAC CATHETERIZATION  05/12/2008   Stent to the distal RCA  . CARDIAC CATHETERIZATION N/A 09/20/2015   Procedure: Left Heart Cath and Coronary Angiography;  Surgeon: Claudia Booze, MD;  Location: [facility];  Service: Cardiovascular;  Laterality: N/A;  . CARDIAC CATHETERIZATION  09/20/2015   Procedure: Intravascular Ultrasound/IVUS;  Surgeon: Claudia Booze, MD;  Location: [facility];  Service: Cardiovascular;;  . CARDIAC CATHETERIZATION  09/20/2015   Procedure: Intravascular Pressure Wire/FFR Study;  Surgeon: Claudia Booze, MD;  Location: [facility];  Service: Cardiovascular;;  . CLIPPING OF ATRIAL APPENDAGE N/A 09/23/2015   Procedure:  CLIPPING OF ATRIAL APPENDAGE;  Surgeon: Claudia Isaac, MD;  Location: [facility];  Service: Open Heart Surgery;  Laterality: N/A;  . CORONARY ARTERY BYPASS GRAFT N/A 09/23/2015   Procedure: CORONARY ARTERY BYPASS GRAFTING (CABG) TIMES 1 USING LEFT INTERNAL MAMMARY TO THE LAD;  Surgeon: Claudia Isaac, MD;  Location: [facility];  Service: Open Heart Surgery;  Laterality: N/A;  . MAZE N/A 09/23/2015   Procedure: MAZE;  Surgeon: Claudia Isaac, MD;  Location: [facility];  Service: Open Heart Surgery;  Laterality: N/A;  . RESECTION OF ARTERIOVENOUS FISTULA ANEURYSM Right 10/21/2015   Procedure: SUTURE REPAIR OF RADIAL ARTERY AND EVACUATION OF HEMATOMA;  Surgeon: Claudia Misty, MD;  Location: [facility];  Service: Vascular;  Laterality: Right;  . TEE WITHOUT CARDIOVERSION N/A 09/23/2015   Procedure: TRANSESOPHAGEAL ECHOCARDIOGRAM (TEE);  Surgeon: Claudia Isaac, MD;  Location: [facility];  Service: Open Heart Surgery;  Laterality: N/A;    Allergies  Allergies  Allergen Reactions  . Sulfamethoxazole-Trimethoprim Other (See Comments)    Reaction: unknown  . Cortisone Nausea Only and Other (See Comments)    Pt gets  very hot and flushed      Home Medications  Prior to Admission medications   Medication Sig Start Date End Date Taking? Authorizing Provider  acetaminophen (TYLENOL) 500 MG tablet Take 500 mg by mouth as needed.    Yes Historical Provider, MD  amitriptyline (ELAVIL) 10 MG tablet Take 10 mg by mouth at bedtime.   Yes Historical Provider, MD  amLODipine (NORVASC) 5 MG tablet Take 1 tablet (5 mg total) by mouth daily. 09/03/11 09/02/12 Yes Thayer Headings, MD  aspirin 325 MG tablet Take 325 mg by mouth daily.     Yes Historical Provider,  MD  carvedilol (COREG) 12.5 MG tablet Take 1 tablet (12.5 mg total) by mouth 2 (two) times daily. 09/03/11 09/02/12 Yes Thayer Headings, MD  clopidogrel (PLAVIX) 75 MG tablet TAKE ONE TABLET BY MOUTH DAILY 07/06/11  Yes Burtis Junes, NP  diclofenac sodium (VOLTAREN) 1 % GEL Apply topically. **CREAM** use as directed    Yes Historical Provider, MD  fluticasone (FLONASE) 50 MCG/ACT nasal spray Place 1 spray into the nose daily.   Yes Historical Provider, MD  hydrochlorothiazide 25 MG tablet Take 25 mg by mouth daily.    Yes Historical Provider, MD  HYDROcodone-homatropine (HYCODAN) 5-1.5 MG/5ML syrup Take 5 mLs by mouth every 8 (eight) hours as needed for cough. 08/29/11 09/08/11 Yes Robyn Haber, MD  IRON PO Take 1 tablet by mouth daily.     Yes Historical Provider, MD  levofloxacin (LEVAQUIN) 500 MG tablet Take 1 tablet (500 mg total) by mouth daily. 08/29/11 09/08/11 Yes Robyn Haber, MD  methimazole (TAPAZOLE) 5 MG tablet Take 5 mg by mouth daily.   Yes Historical Provider, MD  nitroGLYCERIN (NITROSTAT) 0.4 MG SL tablet Place 0.4 mg under the tongue every 5 (five) minutes as needed.     Yes Historical Provider, MD  pantoprazole (PROTONIX) 40 MG tablet Take 40 mg by mouth 2 (two) times daily.    Yes Historical Provider, MD  potassium chloride (KLOR-CON) 10 MEQ CR tablet Take 1 tablet (10 mEq total) by mouth daily. 02/23/11  Yes Thayer Headings, MD  pramipexole  (MIRAPEX) 0.25 MG tablet Take 1 mg by mouth at bedtime.    Yes Historical Provider, MD  rosuvastatin (CRESTOR) 10 MG tablet Take 1 tablet (10 mg total) by mouth daily. 06/06/11  Yes Thayer Headings, MD  traMADol (ULTRAM) 50 MG tablet Take 50 mg by mouth 3 (three) times daily as needed.    Yes Historical Provider, MD  venlafaxine (EFFEXOR) 75 MG tablet Take 75 mg by mouth daily. 1/2 tablet 2 (two) times daily   Yes Historical Provider, MD  lisinopril (PRINIVIL,ZESTRIL) 40 MG tablet Take 1 tablet (40 mg total) by mouth daily. 09/07/11 09/06/12  Thayer Headings, MD     Physical Exam  Blood pressure (!) 150/60, pulse 80.  General: Pleasant, NAD Psych: Normal affect. Neuro: Alert and oriented X 3. Moves all extremities spontaneously. HEENT: Normal  Neck: Supple without bruits or JVD. Lungs:  Resp regular and unlabored, CTA. Heart: RRR no s3, s4. 1/6 sem left sternal border  Abdomen: Soft, non-tender, non-distended, BS + x 4.  Extremities: No clubbing, cyanosis or edema.  Marland Kitchen DP/PT/Radials 2+ and equal bilaterally.  ECG   Assessment and Plan    1. CAD - she's not having any episodes of chest discomfort. Her Plavix has been stopped because she's now on Eliquis. Continue current medications.  2. Hyperlipidemia - continue current medications. We'll plan on checking lipids again today   3. Obstructive sleep apnea-  Needs to get back to see her doctor who has been managing this   4. Hypertension -BP is a bit low and we need to start metoprolol. She will monitor her BP and keep a record. We can increase her meds at her next visit if needed    5. Atrial fib - Continue Eliquis.   She had a MAZE procedure with her surgery . She has maintained NSR   Will see her in 3 months      Mertie Moores, MD  04/04/2016 4:46 PM    [facility] [place],  [address] [place], [place]  [address] Pager [phone] Phone: [phone]; Fax: [phone]

## 2016-04-04 NOTE — Patient Instructions (Signed)
Medication Instructions:  Your physician recommends that you continue on your current medications as directed. Please refer to the Current Medication list given to you today.   Labwork: None Ordered   Testing/Procedures: None Ordered   Follow-Up: Your physician recommends that you schedule a follow-up appointment in: 3 months with Dr. Nahser   If you need a refill on your cardiac medications before your next appointment, please call your pharmacy.   Thank you for choosing [facility]! Michelle Swinyer, RN [phone]    

## 2016-04-10 ENCOUNTER — Ambulatory Visit: Payer: Medicare Other | Admitting: Cardiovascular Disease

## 2016-04-25 ENCOUNTER — Emergency Department (HOSPITAL_COMMUNITY): Payer: Medicare Other

## 2016-04-25 ENCOUNTER — Encounter (HOSPITAL_COMMUNITY): Payer: Self-pay | Admitting: Emergency Medicine

## 2016-04-25 ENCOUNTER — Inpatient Hospital Stay (HOSPITAL_COMMUNITY)
Admission: EM | Admit: 2016-04-25 | Discharge: 2016-04-29 | DRG: 202 | Disposition: A | Discharge status: Home Health | Payer: Medicare Other | Attending: Internal Medicine | Admitting: Internal Medicine

## 2016-04-25 DIAGNOSIS — I10 Essential (primary) hypertension: Secondary | ICD-10-CM | POA: Diagnosis not present

## 2016-04-25 DIAGNOSIS — F329 Major depressive disorder, single episode, unspecified: Secondary | ICD-10-CM | POA: Diagnosis present

## 2016-04-25 DIAGNOSIS — I5032 Chronic diastolic (congestive) heart failure: Secondary | ICD-10-CM | POA: Diagnosis present

## 2016-04-25 DIAGNOSIS — K219 Gastro-esophageal reflux disease without esophagitis: Secondary | ICD-10-CM | POA: Diagnosis present

## 2016-04-25 DIAGNOSIS — G9332 Myalgic encephalomyelitis/chronic fatigue syndrome: Secondary | ICD-10-CM | POA: Diagnosis present

## 2016-04-25 DIAGNOSIS — Z7901 Long term (current) use of anticoagulants: Secondary | ICD-10-CM

## 2016-04-25 DIAGNOSIS — J209 Acute bronchitis, unspecified: Secondary | ICD-10-CM | POA: Diagnosis not present

## 2016-04-25 DIAGNOSIS — Z9981 Dependence on supplemental oxygen: Secondary | ICD-10-CM | POA: Diagnosis not present

## 2016-04-25 DIAGNOSIS — R0609 Other forms of dyspnea: Secondary | ICD-10-CM | POA: Diagnosis present

## 2016-04-25 DIAGNOSIS — R0602 Shortness of breath: Secondary | ICD-10-CM

## 2016-04-25 DIAGNOSIS — I2510 Atherosclerotic heart disease of native coronary artery without angina pectoris: Secondary | ICD-10-CM | POA: Diagnosis present

## 2016-04-25 DIAGNOSIS — I951 Orthostatic hypotension: Secondary | ICD-10-CM | POA: Diagnosis present

## 2016-04-25 DIAGNOSIS — M797 Fibromyalgia: Secondary | ICD-10-CM | POA: Diagnosis present

## 2016-04-25 DIAGNOSIS — Z7982 Long term (current) use of aspirin: Secondary | ICD-10-CM

## 2016-04-25 DIAGNOSIS — G4733 Obstructive sleep apnea (adult) (pediatric): Secondary | ICD-10-CM | POA: Diagnosis present

## 2016-04-25 DIAGNOSIS — Z8249 Family history of ischemic heart disease and other diseases of the circulatory system: Secondary | ICD-10-CM

## 2016-04-25 DIAGNOSIS — Z9119 Patient's noncompliance with other medical treatment and regimen: Secondary | ICD-10-CM

## 2016-04-25 DIAGNOSIS — R0600 Dyspnea, unspecified: Secondary | ICD-10-CM | POA: Diagnosis not present

## 2016-04-25 DIAGNOSIS — E785 Hyperlipidemia, unspecified: Secondary | ICD-10-CM | POA: Diagnosis present

## 2016-04-25 DIAGNOSIS — Z87891 Personal history of nicotine dependence: Secondary | ICD-10-CM

## 2016-04-25 DIAGNOSIS — I130 Hypertensive heart and chronic kidney disease with heart failure and stage 1 through stage 4 chronic kidney disease, or unspecified chronic kidney disease: Secondary | ICD-10-CM | POA: Diagnosis present

## 2016-04-25 DIAGNOSIS — Z951 Presence of aortocoronary bypass graft: Secondary | ICD-10-CM

## 2016-04-25 DIAGNOSIS — J189 Pneumonia, unspecified organism: Secondary | ICD-10-CM | POA: Diagnosis not present

## 2016-04-25 DIAGNOSIS — H409 Unspecified glaucoma: Secondary | ICD-10-CM | POA: Diagnosis present

## 2016-04-25 DIAGNOSIS — R079 Chest pain, unspecified: Secondary | ICD-10-CM

## 2016-04-25 DIAGNOSIS — I4891 Unspecified atrial fibrillation: Secondary | ICD-10-CM | POA: Diagnosis not present

## 2016-04-25 DIAGNOSIS — R5382 Chronic fatigue, unspecified: Secondary | ICD-10-CM | POA: Diagnosis present

## 2016-04-25 DIAGNOSIS — Z79899 Other long term (current) drug therapy: Secondary | ICD-10-CM

## 2016-04-25 DIAGNOSIS — N183 Chronic kidney disease, stage 3 (moderate): Secondary | ICD-10-CM | POA: Diagnosis present

## 2016-04-25 DIAGNOSIS — F32A Depression, unspecified: Secondary | ICD-10-CM | POA: Diagnosis present

## 2016-04-25 DIAGNOSIS — Z23 Encounter for immunization: Secondary | ICD-10-CM

## 2016-04-25 DIAGNOSIS — R0789 Other chest pain: Secondary | ICD-10-CM | POA: Diagnosis not present

## 2016-04-25 LAB — CBC WITH DIFFERENTIAL/PLATELET
BASOS PCT: 1 %
Basophils Absolute: 0.1 10*3/uL (ref 0.0–0.1)
Eosinophils Absolute: 0.2 10*3/uL (ref 0.0–0.7)
Eosinophils Relative: 2 %
HEMATOCRIT: 38.4 % (ref 36.0–46.0)
Hemoglobin: 12.3 g/dL (ref 12.0–15.0)
LYMPHS ABS: 2.5 10*3/uL (ref 0.7–4.0)
Lymphocytes Relative: 33 %
MCH: 28.3 pg (ref 26.0–34.0)
MCHC: 32 g/dL (ref 30.0–36.0)
MCV: 88.5 fL (ref 78.0–100.0)
MONO ABS: 0.5 10*3/uL (ref 0.1–1.0)
MONOS PCT: 7 %
NEUTROS ABS: 4.2 10*3/uL (ref 1.7–7.7)
Neutrophils Relative %: 57 %
Platelets: 253 10*3/uL (ref 150–400)
RBC: 4.34 MIL/uL (ref 3.87–5.11)
RDW: 15.6 % — AB (ref 11.5–15.5)
WBC: 7.4 10*3/uL (ref 4.0–10.5)

## 2016-04-25 LAB — BRAIN NATRIURETIC PEPTIDE: B Natriuretic Peptide: 70.1 pg/mL (ref 0.0–100.0)

## 2016-04-25 LAB — BASIC METABOLIC PANEL
Anion gap: 8 (ref 5–15)
BUN: 20 mg/dL (ref 6–20)
CALCIUM: 9.7 mg/dL (ref 8.9–10.3)
CHLORIDE: 105 mmol/L (ref 101–111)
CO2: 23 mmol/L (ref 22–32)
CREATININE: 1.21 mg/dL — AB (ref 0.44–1.00)
GFR calc non Af Amer: 43 mL/min — ABNORMAL LOW (ref >60)
GFR, EST AFRICAN AMERICAN: 50 mL/min — AB (ref >60)
GLUCOSE: 124 mg/dL — AB (ref 65–99)
Potassium: 4.2 mmol/L (ref 3.5–5.1)
Sodium: 136 mmol/L (ref 135–145)

## 2016-04-25 LAB — I-STAT TROPONIN, ED: Troponin i, poc: 0.03 ng/mL (ref 0.00–0.08)

## 2016-04-25 MED ORDER — POLYETHYLENE GLYCOL 3350 17 G PO PACK
17.0000 g | PACK | Freq: Every day | ORAL | Status: DC
  Administered 2016-04-26 – 2016-04-28 (×2): 17 g via ORAL
  Filled 2016-04-25 (×3): qty 1

## 2016-04-25 MED ORDER — ASPIRIN EC 81 MG PO TBEC
81.0000 mg | DELAYED_RELEASE_TABLET | Freq: Every day | ORAL | Status: DC
  Administered 2016-04-26 – 2016-04-29 (×4): 81 mg via ORAL
  Filled 2016-04-25 (×4): qty 1

## 2016-04-25 MED ORDER — LISINOPRIL 20 MG PO TABS
20.0000 mg | ORAL_TABLET | Freq: Every day | ORAL | Status: DC
  Administered 2016-04-26 – 2016-04-29 (×4): 20 mg via ORAL
  Filled 2016-04-25 (×4): qty 1

## 2016-04-25 MED ORDER — NITROGLYCERIN 0.4 MG SL SUBL: 0.4000 mg | SUBLINGUAL_TABLET | SUBLINGUAL | Status: DC | PRN

## 2016-04-25 MED ORDER — APIXABAN 5 MG PO TABS
5.0000 mg | ORAL_TABLET | Freq: Two times a day (BID) | ORAL | Status: DC
  Administered 2016-04-26 – 2016-04-29 (×8): 5 mg via ORAL
  Filled 2016-04-25 (×9): qty 1

## 2016-04-25 MED ORDER — FUROSEMIDE 40 MG PO TABS
40.0000 mg | ORAL_TABLET | Freq: Every day | ORAL | Status: DC
  Administered 2016-04-26: 40 mg via ORAL
  Filled 2016-04-25: qty 1

## 2016-04-25 MED ORDER — ONDANSETRON HCL 4 MG PO TABS
4.0000 mg | ORAL_TABLET | Freq: Four times a day (QID) | ORAL | Status: DC | PRN
  Administered 2016-04-26: 4 mg via ORAL
  Filled 2016-04-25: qty 1

## 2016-04-25 MED ORDER — NITROFURANTOIN MACROCRYSTAL 100 MG PO CAPS
100.0000 mg | ORAL_CAPSULE | Freq: Every day | ORAL | Status: DC
  Administered 2016-04-26 – 2016-04-28 (×4): 100 mg via ORAL
  Filled 2016-04-25 (×5): qty 1

## 2016-04-25 MED ORDER — VENLAFAXINE HCL 37.5 MG PO TABS
37.5000 mg | ORAL_TABLET | Freq: Two times a day (BID) | ORAL | Status: DC
  Administered 2016-04-26 – 2016-04-29 (×8): 37.5 mg via ORAL
  Filled 2016-04-25 (×11): qty 1

## 2016-04-25 MED ORDER — ACETAMINOPHEN 650 MG RE SUPP: 650.0000 mg | Freq: Four times a day (QID) | RECTAL | Status: DC | PRN

## 2016-04-25 MED ORDER — ONDANSETRON HCL 4 MG/2ML IJ SOLN: 4.0000 mg | Freq: Four times a day (QID) | INTRAMUSCULAR | Status: DC | PRN

## 2016-04-25 MED ORDER — METOPROLOL TARTRATE 25 MG PO TABS
25.0000 mg | ORAL_TABLET | Freq: Two times a day (BID) | ORAL | Status: DC
  Administered 2016-04-26 – 2016-04-29 (×8): 25 mg via ORAL
  Filled 2016-04-25 (×8): qty 1

## 2016-04-25 MED ORDER — FERROUS SULFATE 325 (65 FE) MG PO TABS
325.0000 mg | ORAL_TABLET | Freq: Every morning | ORAL | Status: DC
  Administered 2016-04-26 – 2016-04-29 (×4): 325 mg via ORAL
  Filled 2016-04-25 (×4): qty 1

## 2016-04-25 MED ORDER — TRAMADOL HCL 50 MG PO TABS
50.0000 mg | ORAL_TABLET | Freq: Four times a day (QID) | ORAL | Status: DC | PRN
  Administered 2016-04-27: 50 mg via ORAL
  Filled 2016-04-25: qty 1

## 2016-04-25 MED ORDER — POTASSIUM CHLORIDE CRYS ER 10 MEQ PO TBCR
10.0000 meq | EXTENDED_RELEASE_TABLET | Freq: Every day | ORAL | Status: DC
  Administered 2016-04-26 – 2016-04-29 (×4): 10 meq via ORAL
  Filled 2016-04-25 (×4): qty 1

## 2016-04-25 MED ORDER — ALBUTEROL SULFATE (2.5 MG/3ML) 0.083% IN NEBU: 5.0000 mg | INHALATION_SOLUTION | Freq: Once | RESPIRATORY_TRACT | Status: DC

## 2016-04-25 MED ORDER — PANTOPRAZOLE SODIUM 40 MG PO TBEC
40.0000 mg | DELAYED_RELEASE_TABLET | Freq: Every day | ORAL | Status: DC
  Administered 2016-04-26 – 2016-04-29 (×4): 40 mg via ORAL
  Filled 2016-04-25 (×4): qty 1

## 2016-04-25 MED ORDER — ACETAMINOPHEN 325 MG PO TABS
650.0000 mg | ORAL_TABLET | Freq: Four times a day (QID) | ORAL | Status: DC | PRN
  Administered 2016-04-26: 650 mg via ORAL
  Filled 2016-04-25: qty 2

## 2016-04-25 MED ORDER — PRAMIPEXOLE DIHYDROCHLORIDE 1 MG PO TABS
1.0000 mg | ORAL_TABLET | Freq: Every day | ORAL | Status: DC
  Administered 2016-04-26 (×2): 1 mg via ORAL
  Administered 2016-04-27: 2 mg via ORAL
  Administered 2016-04-27: 1 mg via ORAL
  Administered 2016-04-28: 2 mg via ORAL
  Administered 2016-04-28: 1 mg via ORAL
  Filled 2016-04-25: qty 2
  Filled 2016-04-25 (×2): qty 3
  Filled 2016-04-25: qty 2
  Filled 2016-04-25 (×2): qty 1

## 2016-04-25 NOTE — ED Provider Notes (Signed)
Emergency Department Provider Note   I have reviewed the triage vital signs and the nursing notes.   HISTORY  Chief Complaint Shortness of Breath and Chest Pain   HPI [name] is a 75 y.o. female with PMH of anxiety, CAD s/p CABG in May 2017 with STEMI, fibromyalgia, HLD, HTN, and OSA presents to the emergency department for evaluation of severe dyspnea over the past several weeks. Patient has had some associated intermittent chest pain and near-syncope symptoms. She's not had a full syncopal event. She had a fever several weeks ago but none recently. No productive cough. She had fatigue prior to her CABG but states it has been much worse over the past several weeks and especially over the last several days. She has dyspnea both at rest and with exertion. She reports being compliant with her blood thinner and fluid medication.    Past Medical History:  Diagnosis Date  . Anxiety   . Coronary artery disease    a. s/p MI 2009 - PCI/DES distal  RCA w/ 2.75 x 18 Xience DES;  b. 05/2010 Myoview: Apical thinning, EF 73%;  c. 06/2010 Cath: moderate nonobs dzs, EF 65%;  d.  Lexiscan Myoview (07/2013):  No scar or ischemia, EF 67%; Normal Study  . Environmental allergies   . Essential iris atrophy    Right side  . Fatigue   . Fibromyalgia   . GERD (gastroesophageal reflux disease)   . Glaucoma   . Hyperlipidemia   . Hypertension   . Menopause   . Obesity (BMI 30-39.9)   . OSA (obstructive sleep apnea)    does not wear CPAP  . Osteoarthritis   . Oxygen dependent    3L    Patient Active Problem List   Diagnosis Date Noted  . Dyspnea 04/25/2016  . 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 Pseudoaneurysm ([place]) 10/18/2015  . Anxiety state 10/03/2015  . CAD (coronary artery disease) 09/23/2015  . Chest pain, rule out acute myocardial infarction   . New onset atrial fibrillation ([place])   . Atrial fibrillation with RVR ([place]) 12/19/2014  . Atrial fibrillation ([place])  12/19/2014  . Multiple thyroid nodules 01/12/2014  . OSA (obstructive sleep apnea)   . Obesity (BMI 30-39.9)   . Sleep apnea 06/22/2013  . Fibromyalgia   . Dizziness 09/15/2010  . Orthostatic hypotension 09/15/2010  . Hematuria 09/15/2010  . Coronary artery disease   . Hypertension   . Hyperlipidemia   . Fatigue   . History of myocardial infarction     Past Surgical History:  Procedure Laterality Date  . CARDIAC CATHETERIZATION  06/29/2010   Mild to moderate coronary artery irregularities.  Her  proximal left anterior descending artery is moderately narrowed.  She  also has an eccentric stenosis in the proximal LAD.  These do not appear  to obstruct flow at present, but they are fairly small vessels.  They  appeared to be between 1.5 and 2 mm in diamete  . CARDIAC CATHETERIZATION  08/02/2009   Patent stent  . CARDIAC CATHETERIZATION  05/12/2008   Stent to the distal RCA  . CARDIAC CATHETERIZATION N/A 09/20/2015   Procedure: Left Heart Cath and Coronary Angiography;  Surgeon: Jettie Booze, MD;  Location: [facility];  Service: Cardiovascular;  Laterality: N/A;  . CARDIAC CATHETERIZATION  09/20/2015   Procedure: Intravascular Ultrasound/IVUS;  Surgeon: Jettie Booze, MD;  Location: [facility];  Service: Cardiovascular;;  . CARDIAC CATHETERIZATION  09/20/2015   Procedure: Intravascular Pressure Wire/FFR Study;  Surgeon: Jettie Booze, MD;  Location: [facility];  Service: Cardiovascular;;  . CLIPPING OF ATRIAL APPENDAGE N/A 09/23/2015   Procedure:  CLIPPING OF ATRIAL APPENDAGE;  Surgeon: Grace Isaac, MD;  Location: [facility];  Service: Open Heart Surgery;  Laterality: N/A;  . CORONARY ARTERY BYPASS GRAFT N/A 09/23/2015   Procedure: CORONARY ARTERY BYPASS GRAFTING (CABG) TIMES 1 USING LEFT INTERNAL MAMMARY TO THE LAD;  Surgeon: Grace Isaac, MD;  Location: [facility];  Service: Open Heart Surgery;  Laterality: N/A;  . MAZE N/A 09/23/2015   Procedure: MAZE;   Surgeon: Grace Isaac, MD;  Location: [facility];  Service: Open Heart Surgery;  Laterality: N/A;  . RESECTION OF ARTERIOVENOUS FISTULA ANEURYSM Right 10/21/2015   Procedure: SUTURE REPAIR OF RADIAL ARTERY AND EVACUATION OF HEMATOMA;  Surgeon: Mal Misty, MD;  Location: [facility];  Service: Vascular;  Laterality: Right;  . TEE WITHOUT CARDIOVERSION N/A 09/23/2015   Procedure: TRANSESOPHAGEAL ECHOCARDIOGRAM (TEE);  Surgeon: Grace Isaac, MD;  Location: [facility];  Service: Open Heart Surgery;  Laterality: N/A;    Current Outpatient Rx  . Order #: [id number] Class: Normal  . Order #: [id number] Class: No Print  . Order #: [id number] Class: Historical Med  . Order #: [id number] Class: Historical Med  . Order #: [id number] Class: Normal  . Order #: [id number] Class: Normal  . Order #: [id number] Class: Normal  . Order #: [id number] Class: Historical Med  . Order #: [id number] Class: Normal  . Order #: [id number] Class: Historical Med  . Order #: [id number] Class: Normal  . Order #: [id number] Class: Historical Med  . Order #: [id number] Class: Print  . Order #: [id number] Class: Historical Med  . Order #: [id number] Class: Historical Med  . Order #: [id number] Class: No Print    Allergies Other; Sulfamethoxazole-trimethoprim; Cortisone; Latex; and Tape  Family History  Problem Relation Age of Onset  . Heart attack Mother   . Cancer Father   . Coronary artery disease Brother     had CABG  . Coronary artery disease Brother   . Coronary artery disease Brother   . Coronary artery disease Brother   . Coronary artery disease Brother   . Heart attack Brother     Social History Social History  Substance Use Topics  . Smoking status: Former Smoker    Types: Cigarettes    Quit date: 09/13/1960  . Smokeless tobacco: Never Used  . Alcohol use No    Review of Systems  Constitutional: No fever/chills Eyes: No visual changes. ENT: No sore throat. Cardiovascular: Positive intermittent chest pain. Respiratory:  Positive shortness of breath. Gastrointestinal: No abdominal pain.  No nausea, no vomiting.  No diarrhea.  No constipation. Genitourinary: Negative for dysuria. Musculoskeletal: Negative for back pain. Skin: Negative for rash. Neurological: Negative for headaches, focal weakness or numbness.  10-point ROS otherwise negative.  ____________________________________________   PHYSICAL EXAM:  VITAL SIGNS: ED Triage Vitals  Enc Vitals Group     BP 04/25/16 1742 160/67     Pulse Rate 04/25/16 1742 65     Resp 04/25/16 1742 17     Temp 04/25/16 1742 97 F (36.1 C)     Temp Source 04/25/16 1742 Oral     SpO2 04/25/16 1742 100 %     Weight 04/25/16 1742 217 lb (98.4 kg)     Height 04/25/16 1742 5\' 6"  (1.676 m)     Pain Score 04/25/16 1743 0   Constitutional: Alert and oriented. Well  appearing and in no acute distress. Eyes: Conjunctivae are normal.  Head: Atraumatic. Nose: No congestion/rhinnorhea. Mouth/Throat: Mucous membranes are moist.  Oropharynx non-erythematous. Neck: No stridor.   Cardiovascular: Normal rate, regular rhythm. Good peripheral circulation. Grossly normal heart sounds.   Respiratory: Normal respiratory effort.  No retractions. Lungs CTAB. Gastrointestinal: Soft and nontender. No distention.  Musculoskeletal: No lower extremity tenderness nor edema. No gross deformities of extremities. Neurologic:  Normal speech and language. No gross focal neurologic deficits are appreciated.  Skin:  Skin is warm, dry and intact. No rash noted.  ____________________________________________   LABS (all labs ordered are listed, but only abnormal results are displayed)  Labs Reviewed  CBC WITH DIFFERENTIAL/PLATELET - Abnormal; Notable for the following:       Result Value   RDW 15.6 (*)    All other components within normal limits  BASIC METABOLIC PANEL - Abnormal; Notable for the following:    Glucose, Bld 124 (*)    Creatinine, Ser 1.21 (*)    GFR calc non Af Amer 43  (*)    GFR calc Af Amer 50 (*)    All other components within normal limits  BRAIN NATRIURETIC PEPTIDE  I-STAT TROPOININ, ED   ____________________________________________  EKG   EKG Interpretation  Date/Time:  Wednesday April 25 2016 17:41:08 EST Ventricular Rate:  68 PR Interval:  164 QRS Duration: 82 QT Interval:  402 QTC Calculation: 427 R Axis:   87 Text Interpretation:  Normal sinus rhythm Nonspecific ST abnormality Abnormal ECG No STEMI.  Confirmed by Sayid Moll MD, Traylen Eckels [phone]) on 04/25/2016 8:35:04 PM       ____________________________________________  RADIOLOGY  Dg Chest 2 View  Result Date: 04/25/2016 CLINICAL DATA:  Dyspnea x1 week.  Fluid 4 weeks ago. EXAM: CHEST  2 VIEW COMPARISON:  12/26/2015 FINDINGS: The heart is normal in size. There is aortic atherosclerosis. The patient is status post CABG as well as atrial appendage clipping. Mild diffuse interstitial prominence is noted suggesting bronchitic change. Minimal superimposed airspace opacities in the periphery of the right upper lobe. No pleural or pneumothorax. No acute osseous abnormality. IMPRESSION: Minimal right upper lobe pneumonic consolidation superimposed on bronchitic change. Electronically Signed   By: Ashley Royalty M.D.   On: 04/25/2016 18:09    ____________________________________________   PROCEDURES  Procedure(s) performed:   Procedures  None ____________________________________________   INITIAL IMPRESSION / ASSESSMENT AND PLAN / ED COURSE  Pertinent labs & imaging results that were available during my care of the patient were reviewed by me and considered in my medical decision making (see chart for details).  Patient resents emergency department for evaluation of intermittent chest pain and debilitating dyspnea is worse over the last week but has been present for several weeks. She's also had some near syncope events since her CABG in May 2017. I have low suspicion for pulmonary  embolism as the patient is been compliant with her Eliquis. Symptoms may be related to anxiety as a patient has a history of this but given her recent CABG and intermittent chest pain I'm concerned that the dyspnea may represent an anginal equivalent. Plan for observational admission and enzyme trending and consideration of cardiac ECHO.   Discussed patient's case with hospitalist, Dr. Hal Hope.  Recommend admission to tele, obs bed.  I will place holding orders per their request. Patient and family (if present) updated with plan. Care transferred to hospitalist service.  I reviewed all nursing notes, vitals, pertinent old records, EKGs, labs, imaging (as available).  ____________________________________________  FINAL CLINICAL IMPRESSION(S) / ED DIAGNOSES  Final diagnoses:  Shortness of breath  Chest pain, unspecified type     MEDICATIONS GIVEN DURING THIS VISIT:  Medications  albuterol (PROVENTIL) (2.5 MG/3ML) 0.083% nebulizer solution 5 mg (not administered)     NEW OUTPATIENT MEDICATIONS STARTED DURING THIS VISIT:  None   Note:  This document was prepared using Dragon voice recognition software and may include unintentional dictation errors.  Nanda Quinton, MD Emergency Medicine   Margette Fast, MD 04/25/16 2212

## 2016-04-25 NOTE — H&P (Signed)
History and Physical    [name] [id number] DOB: 1941-02-23 DOA: 04/25/2016  PCP: Donnie Coffin, MD  Patient coming from: Home.  Chief Complaint: Shortness of breath.  HPI: [name] is a 75 y.o. female with CAD status post CABG this year in May 2017, atrial fibrillation, hypertension presents to the ER because of increasing shortness of breath. Patient states over the last 1 month patient has been having increasing exertional shortness of breath. Denies any productive cough chest pain fever chills. Shortness of breath at this time is also present at rest. Chest x-ray shows consolidation but patient has not been febrile or having any productive cough to suggest pneumonia. Patient is being admitted for further observation.   ED Course: EKG shows normal sinus rhythm with nonspecific ST changes. Troponin negative. Chest x-ray shows possible consolidation.  Review of Systems: As per HPI, rest all negative.   Past Medical History:  Diagnosis Date  . Anxiety   . Coronary artery disease    a. s/p MI 2009 - PCI/DES distal  RCA w/ 2.75 x 18 Xience DES;  b. 05/2010 Myoview: Apical thinning, EF 73%;  c. 06/2010 Cath: moderate nonobs dzs, EF 65%;  d.  Lexiscan Myoview (07/2013):  No scar or ischemia, EF 67%; Normal Study  . Environmental allergies   . Essential iris atrophy    Right side  . Fatigue   . Fibromyalgia   . GERD (gastroesophageal reflux disease)   . Glaucoma   . Hyperlipidemia   . Hypertension   . Menopause   . Obesity (BMI 30-39.9)   . OSA (obstructive sleep apnea)    does not wear CPAP  . Osteoarthritis   . Oxygen dependent    3L    Past Surgical History:  Procedure Laterality Date  . CARDIAC CATHETERIZATION  06/29/2010   Mild to moderate coronary artery irregularities.  Her  proximal left anterior descending artery is moderately narrowed.  She  also has an eccentric stenosis in the proximal LAD.  These do not appear  to obstruct flow at present, but they are  fairly small vessels.  They  appeared to be between 1.5 and 2 mm in diamete  . CARDIAC CATHETERIZATION  08/02/2009   Patent stent  . CARDIAC CATHETERIZATION  05/12/2008   Stent to the distal RCA  . CARDIAC CATHETERIZATION N/A 09/20/2015   Procedure: Left Heart Cath and Coronary Angiography;  Surgeon: Jettie Booze, MD;  Location: [facility];  Service: Cardiovascular;  Laterality: N/A;  . CARDIAC CATHETERIZATION  09/20/2015   Procedure: Intravascular Ultrasound/IVUS;  Surgeon: Jettie Booze, MD;  Location: [facility];  Service: Cardiovascular;;  . CARDIAC CATHETERIZATION  09/20/2015   Procedure: Intravascular Pressure Wire/FFR Study;  Surgeon: Jettie Booze, MD;  Location: [facility];  Service: Cardiovascular;;  . CLIPPING OF ATRIAL APPENDAGE N/A 09/23/2015   Procedure:  CLIPPING OF ATRIAL APPENDAGE;  Surgeon: Grace Isaac, MD;  Location: [facility];  Service: Open Heart Surgery;  Laterality: N/A;  . CORONARY ARTERY BYPASS GRAFT N/A 09/23/2015   Procedure: CORONARY ARTERY BYPASS GRAFTING (CABG) TIMES 1 USING LEFT INTERNAL MAMMARY TO THE LAD;  Surgeon: Grace Isaac, MD;  Location: [facility];  Service: Open Heart Surgery;  Laterality: N/A;  . MAZE N/A 09/23/2015   Procedure: MAZE;  Surgeon: Grace Isaac, MD;  Location: [facility];  Service: Open Heart Surgery;  Laterality: N/A;  . RESECTION OF ARTERIOVENOUS FISTULA ANEURYSM Right 10/21/2015  Procedure: SUTURE REPAIR OF RADIAL ARTERY AND EVACUATION OF HEMATOMA;  Surgeon: Mal Misty, MD;  Location: [facility];  Service: Vascular;  Laterality: Right;  . TEE WITHOUT CARDIOVERSION N/A 09/23/2015   Procedure: TRANSESOPHAGEAL ECHOCARDIOGRAM (TEE);  Surgeon: Grace Isaac, MD;  Location: [facility];  Service: Open Heart Surgery;  Laterality: N/A;     reports that she quit smoking about 55 years ago. Her smoking use included Cigarettes. She has never used smokeless tobacco. She reports that she does not drink alcohol or use  drugs.  Allergies  Allergen Reactions  . Other Nausea Only    NO MEAT!!  . Sulfamethoxazole-Trimethoprim Nausea Only  . Cortisone Nausea Only and Other (See Comments)    Pt gets very hot and flushed  . Latex Rash    Redness and irritation  . Tape Rash    Redness and irritation    Family History  Problem Relation Age of Onset  . Heart attack Mother   . Cancer Father   . Coronary artery disease Brother     had CABG  . Coronary artery disease Brother   . Coronary artery disease Brother   . Coronary artery disease Brother   . Coronary artery disease Brother   . Heart attack Brother     Prior to Admission medications   Medication Sig Start Date End Date Taking? Authorizing Provider  apixaban (ELIQUIS) 5 MG TABS tablet TAKE 1 TABLET(5 MG) BY MOUTH TWICE DAILY 03/07/16  Yes Thayer Headings, MD  aspirin EC 81 MG EC tablet Take 1 tablet (81 mg total) by mouth daily. 09/30/15  Yes Erin R Barrett, PA-C  Cyanocobalamin (VITAMIN B-12 PO) Take 1 tablet by mouth daily.   Yes Historical Provider, MD  ferrous sulfate 325 (65 FE) MG tablet Take 325 mg by mouth every morning.    Yes Historical Provider, MD  furosemide (LASIX) 40 MG tablet Take 1 tablet (40 mg total) by mouth daily. 03/07/16  Yes Thayer Headings, MD  lisinopril (PRINIVIL,ZESTRIL) 20 MG tablet Take 1 tablet (20 mg total) by mouth daily. 03/07/16  Yes Thayer Headings, MD  metoprolol tartrate (LOPRESSOR) 25 MG tablet Take 1 tablet (25 mg total) by mouth 2 (two) times daily. 03/07/16  Yes Thayer Headings, MD  nitrofurantoin (MACRODANTIN) 100 MG capsule Take 100 mg by mouth daily.  12/22/13  Yes Historical Provider, MD  nitroGLYCERIN (NITROSTAT) 0.4 MG SL tablet Place 1 tablet (0.4 mg total) under the tongue every 5 (five) minutes as needed for chest pain. 02/23/16 05/23/16 Yes Thayer Headings, MD  pantoprazole (PROTONIX) 40 MG tablet Take 40 mg by mouth daily.  01/07/14  Yes Historical Provider, MD  potassium chloride (K-DUR) 10 MEQ tablet  Take 1 tablet (10 mEq total) by mouth daily. 03/07/16  Yes Thayer Headings, MD  pramipexole (MIRAPEX) 1 MG tablet Take 1-3 mg by mouth at bedtime.    Yes Historical Provider, MD  traMADol (ULTRAM) 50 MG tablet Take one tablet by mouth every 4 hours as needed for moderate pain; Take two tablets by mouth every 4 hours as needed for severe pain 10/06/15  Yes Tiffany L Reed, DO  venlafaxine (EFFEXOR) 37.5 MG tablet Take 37.5 mg by mouth 2 (two) times daily.   Yes Historical Provider, MD  acetaminophen (TYLENOL) 500 MG tablet Take 1,000 mg by mouth every 8 (eight) hours as needed for moderate pain.    Historical Provider, MD  polyethylene glycol (MIRALAX / GLYCOLAX) packet Take 17  g by mouth daily. Patient not taking: Reported on 04/25/2016 09/30/15   Freddrick March, PA-C    Physical Exam: Vitals:   04/25/16 2215 04/25/16 2230 04/25/16 2245 04/25/16 2302  BP: 149/57 163/57 144/70 (!) 164/52  Pulse: 84 71 67 78  Resp: 22 18 (!) 27 14  Temp:      TempSrc:      SpO2: 95% 96% 98% 95%  Weight:      Height:          Constitutional: Moderately built and nourished. Vitals:   04/25/16 2215 04/25/16 2230 04/25/16 2245 04/25/16 2302  BP: 149/57 163/57 144/70 (!) 164/52  Pulse: 84 71 67 78  Resp: 22 18 (!) 27 14  Temp:      TempSrc:      SpO2: 95% 96% 98% 95%  Weight:      Height:       Eyes: Anicteric no pallor. ENMT: No discharge from the ears eyes nose or mouth. Neck: No mass felt. No neck rigidity. No JVD appreciated. Respiratory: No rhonchi or crepitations. Cardiovascular: S1 and S2 heard. No murmurs appreciated. Abdomen: Soft nontender bowel sounds present. No guarding or rigidity. Musculoskeletal: No edema. No joint effusion. Skin: No rash. Skin appears warm. Neurologic: Alert awake oriented to time place and person. Moves all extremities. Psychiatric: Normal exam. Normal affect.   Labs on Admission: I have personally reviewed following labs and imaging studies  CBC:  Recent  Labs Lab 04/25/16 1750  WBC 7.4  NEUTROABS 4.2  HGB 12.3  HCT 38.4  MCV 88.5  PLT 123456   Basic Metabolic Panel:  Recent Labs Lab 04/25/16 1750  NA 136  K 4.2  CL 105  CO2 23  GLUCOSE 124*  BUN 20  CREATININE 1.21*  CALCIUM 9.7   GFR: Estimated Creatinine Clearance: 49 mL/min (by C-G formula based on SCr of 1.21 mg/dL (H)). Liver Function Tests: No results for input(s): AST, ALT, ALKPHOS, BILITOT, PROT, ALBUMIN in the last 168 hours. No results for input(s): LIPASE, AMYLASE in the last 168 hours. No results for input(s): AMMONIA in the last 168 hours. Coagulation Profile: No results for input(s): INR, PROTIME in the last 168 hours. Cardiac Enzymes: No results for input(s): CKTOTAL, CKMB, CKMBINDEX, TROPONINI in the last 168 hours. BNP (last 3 results) No results for input(s): PROBNP in the last 8760 hours. HbA1C: No results for input(s): HGBA1C in the last 72 hours. CBG: No results for input(s): GLUCAP in the last 168 hours. Lipid Profile: No results for input(s): CHOL, HDL, LDLCALC, TRIG, CHOLHDL, LDLDIRECT in the last 72 hours. Thyroid Function Tests: No results for input(s): TSH, T4TOTAL, FREET4, T3FREE, THYROIDAB in the last 72 hours. Anemia Panel: No results for input(s): VITAMINB12, FOLATE, FERRITIN, TIBC, IRON, RETICCTPCT in the last 72 hours. Urine analysis:    Component Value Date/Time   COLORURINE YELLOW 09/22/2015 1822   APPEARANCEUR CLEAR 09/22/2015 1822   LABSPEC 1.010 09/22/2015 1822   PHURINE 6.5 09/22/2015 1822   GLUCOSEU NEGATIVE 09/22/2015 1822   GLUCOSEU NEGATIVE 09/22/2010 1456   HGBUR NEGATIVE 09/22/2015 1822   BILIRUBINUR NEGATIVE 09/22/2015 1822   BILIRUBINUR neg 08/13/2012 2027   KETONESUR NEGATIVE 09/22/2015 1822   PROTEINUR NEGATIVE 09/22/2015 1822   UROBILINOGEN 0.2 01/10/2015 0915   NITRITE NEGATIVE 09/22/2015 1822   LEUKOCYTESUR MODERATE (A) 09/22/2015 1822   Sepsis Labs: @LABRCNTIP (procalcitonin:4,lacticidven:4) )No results  found for this or any previous visit (from the past 240 hour(s)).   Radiological Exams on Admission: Dg  Chest 2 View  Result Date: 04/25/2016 CLINICAL DATA:  Dyspnea x1 week.  Fluid 4 weeks ago. EXAM: CHEST  2 VIEW COMPARISON:  12/26/2015 FINDINGS: The heart is normal in size. There is aortic atherosclerosis. The patient is status post CABG as well as atrial appendage clipping. Mild diffuse interstitial prominence is noted suggesting bronchitic change. Minimal superimposed airspace opacities in the periphery of the right upper lobe. No pleural or pneumothorax. No acute osseous abnormality. IMPRESSION: Minimal right upper lobe pneumonic consolidation superimposed on bronchitic change. Electronically Signed   By: Ashley Royalty M.D.   On: 04/25/2016 18:09    EKG: Independently reviewed. Normal sinus rhythm with nonspecific ECG changes.  Assessment/Plan Principal Problem:   Exertional dyspnea Active Problems:   Hypertension   Hyperlipidemia   Orthostatic hypotension   Fibromyalgia   OSA (obstructive sleep apnea)   Atrial fibrillation ([place])    1. Exertional shortness of breath - cause not clear. Chest x-ray shows consolidation but patient is not febrile. Does not have any productive cough. Will check 2-D echo and CT chest to rule out PE or pneumonia and also check other causes. For now I am placing patient on doxycycline and if CT chest does not show any definite evidence of pneumonia may discontinue antibiotics. Cycle cardiac markers. Consult cardiologist in a.m. 2. Hypertension on lisinopril and metoprolol. 3. Atrial fibrillation status post maze procedure - on Apixaban presently in sinus rhythm. Chads 2 vasc score is more than 2. Continue metoprolol. 4. CAD status post CABG - will cycle cardiac markers due to exertional symptoms and also check 2-D echo. 5. History OSA. 6. Hyperlipidemia on statins.   DVT prophylaxis: Apixaban. Code Status: Full code.  Family Communication: Discussed with  patient.  Disposition Plan: Home.  Consults called: None.  Admission status: Observation.    Rise Patience MD [facility] Pager [phone].  If 7PM-7AM, please contact night-coverage [email or web address] Password [email or web address]  04/25/2016, 11:17 PM

## 2016-04-25 NOTE — ED Triage Notes (Signed)
Pt. Stated, I've had SOB with off and on chest pain for over 3 weeks and I just can't stand it anymore.

## 2016-04-26 ENCOUNTER — Other Ambulatory Visit (HOSPITAL_COMMUNITY): Payer: Medicare Other

## 2016-04-26 DIAGNOSIS — I480 Paroxysmal atrial fibrillation: Secondary | ICD-10-CM

## 2016-04-26 DIAGNOSIS — J209 Acute bronchitis, unspecified: Secondary | ICD-10-CM | POA: Diagnosis not present

## 2016-04-26 DIAGNOSIS — R0609 Other forms of dyspnea: Secondary | ICD-10-CM | POA: Diagnosis not present

## 2016-04-26 LAB — BASIC METABOLIC PANEL
Anion gap: 7 (ref 5–15)
BUN: 20 mg/dL (ref 6–20)
CHLORIDE: 101 mmol/L (ref 101–111)
CO2: 27 mmol/L (ref 22–32)
CREATININE: 1.18 mg/dL — AB (ref 0.44–1.00)
Calcium: 9.5 mg/dL (ref 8.9–10.3)
GFR calc Af Amer: 52 mL/min — ABNORMAL LOW (ref >60)
GFR calc non Af Amer: 45 mL/min — ABNORMAL LOW (ref >60)
Glucose, Bld: 95 mg/dL (ref 65–99)
Potassium: 4.3 mmol/L (ref 3.5–5.1)
SODIUM: 135 mmol/L (ref 135–145)

## 2016-04-26 LAB — CBC
HCT: 35.9 % — ABNORMAL LOW (ref 36.0–46.0)
Hemoglobin: 11.7 g/dL — ABNORMAL LOW (ref 12.0–15.0)
MCH: 29 pg (ref 26.0–34.0)
MCHC: 32.6 g/dL (ref 30.0–36.0)
MCV: 89.1 fL (ref 78.0–100.0)
PLATELETS: 217 10*3/uL (ref 150–400)
RBC: 4.03 MIL/uL (ref 3.87–5.11)
RDW: 15.9 % — AB (ref 11.5–15.5)
WBC: 6.7 10*3/uL (ref 4.0–10.5)

## 2016-04-26 LAB — TROPONIN I
TROPONIN I: 0.03 ng/mL — AB (ref <0.03)
TROPONIN I: 0.03 ng/mL — AB (ref <0.03)
Troponin I: <0.03 ng/mL (ref <0.03)

## 2016-04-26 LAB — D-DIMER, QUANTITATIVE (NOT AT ARMC): D DIMER QUANT: 0.27 ug{FEU}/mL (ref 0.00–0.50)

## 2016-04-26 LAB — TSH: TSH: 4.732 u[IU]/mL — ABNORMAL HIGH (ref 0.350–4.500)

## 2016-04-26 MED ORDER — HYDRALAZINE HCL 20 MG/ML IJ SOLN: 10.0000 mg | Freq: Four times a day (QID) | INTRAMUSCULAR | Status: DC | PRN

## 2016-04-26 MED ORDER — GUAIFENESIN ER 600 MG PO TB12
600.0000 mg | ORAL_TABLET | Freq: Two times a day (BID) | ORAL | Status: DC
  Administered 2016-04-26 – 2016-04-29 (×7): 600 mg via ORAL
  Filled 2016-04-26 (×7): qty 1

## 2016-04-26 MED ORDER — ALBUTEROL SULFATE (2.5 MG/3ML) 0.083% IN NEBU: 2.5000 mg | INHALATION_SOLUTION | RESPIRATORY_TRACT | Status: DC | PRN

## 2016-04-26 MED ORDER — PRAMIPEXOLE DIHYDROCHLORIDE 1 MG PO TABS
1.0000 mg | ORAL_TABLET | Freq: Once | ORAL | Status: AC
Start: 2016-04-26 — End: 2016-04-26
  Administered 2016-04-26: 1 mg via ORAL
  Filled 2016-04-26: qty 1

## 2016-04-26 MED ORDER — FLUTICASONE PROPIONATE 50 MCG/ACT NA SUSP
1.0000 | Freq: Every day | NASAL | Status: AC
  Administered 2016-04-26 – 2016-04-28 (×3): 1 via NASAL
  Filled 2016-04-26: qty 16

## 2016-04-26 MED ORDER — DOXYCYCLINE HYCLATE 100 MG IV SOLR
100.0000 mg | Freq: Two times a day (BID) | INTRAVENOUS | Status: DC
  Administered 2016-04-26 – 2016-04-28 (×5): 100 mg via INTRAVENOUS
  Filled 2016-04-26 (×7): qty 100

## 2016-04-26 MED ORDER — PNEUMOCOCCAL VAC POLYVALENT 25 MCG/0.5ML IJ INJ
0.5000 mL | INJECTION | INTRAMUSCULAR | Status: AC
  Administered 2016-04-27: 0.5 mL via INTRAMUSCULAR
  Filled 2016-04-26: qty 0.5

## 2016-04-26 MED ORDER — INFLUENZA VAC SPLIT QUAD 0.5 ML IM SUSY
0.5000 mL | PREFILLED_SYRINGE | INTRAMUSCULAR | Status: AC
  Administered 2016-04-27: 0.5 mL via INTRAMUSCULAR
  Filled 2016-04-26: qty 0.5

## 2016-04-26 NOTE — Progress Notes (Signed)
Patient refused CPAP for the night. Patient stated she felt smothered with the mask. Will continue to monitor patient.

## 2016-04-26 NOTE — Care Management Note (Signed)
Case Management Note  Patient Details  Name: [name] MRN: [id number] Date of Birth: 1940/10/26  Subjective/Objective:   Pt presented for increasing exertional shortness of breath.  Pt is from home alone, However granddaughter has been staying with her recently.                Action/Plan: No needs identified at this time. Will continue to monitor.   Expected Discharge Date:                  Expected Discharge Plan:  Home/Self Care  In-House Referral:  NA  Discharge planning Services  CM Consult  Post Acute Care Choice:    Choice offered to:     DME Arranged:    DME Agency:     HH Arranged:    HH Agency:     Status of Service:  In process, will continue to follow  If discussed at Long Length of Stay Meetings, dates discussed:    Additional Comments:  Bethena Roys, RN 04/26/2016, 12:13 PM

## 2016-04-26 NOTE — Progress Notes (Signed)
PROGRESS NOTE    [name]  [id number] DOB: 1940/10/29 DOA: 04/25/2016 PCP: Donnie Coffin, MD   Outpatient Specialists:     Brief Narrative:  [name] is a 75 y.o. female with CAD status post CABG this year in May 2017, atrial fibrillation, hypertension presents to the ER because of increasing shortness of breath. Patient states over the last 1 month patient has been having increasing exertional shortness of breath. Denies any productive cough chest pain fever chills. Shortness of breath at this time is also present at rest. Chest x-ray shows consolidation.  Patient has cough and congestion.  Patient is being admitted for further observation.    Assessment & Plan:   Principal Problem:   Exertional dyspnea Active Problems:   Hypertension   Hyperlipidemia   Orthostatic hypotension   Fibromyalgia   OSA (obstructive sleep apnea)   Atrial fibrillation (HCC)   SOB -suspect related to URI/bronchitis -nebs -mucinex -abx- doxy -flonase -patient improved this AM  Atrial fib -s/p MAZE procedure -apixaban -metoprolol  Sleep apnea -not compliant with CPAP  CAD s/p CABG -echo pending -if changes on echo will get cardiology consult -CE flat -chest pain reproducible on exam  DVT prophylaxis:  Fully anticoagulated  Code Status: Full Code   Family Communication:   Disposition Plan:     Consultants:        Subjective: Breathing better Chest sore from coughing   Objective: Vitals:   04/26/16 0000 04/26/16 0214 04/26/16 0300 04/26/16 0414  BP: 149/64 146/63 111/72 (!) 178/62  Pulse: 74 70 67 66  Resp: 20 19 19    Temp:    98 F (36.7 C)  TempSrc:    Oral  SpO2: 93% 95% 91% 96%  Weight:    99.4 kg (219 lb 1.6 oz)  Height:    5\' 6"  (1.676 m)    Intake/Output Summary (Last 24 hours) at 04/26/16 1036 Last data filed at 04/26/16 0635  Gross per 24 hour  Intake              250 ml  Output                0 ml  Net              250 ml    Filed Weights   04/25/16 1742 04/26/16 0414  Weight: 98.4 kg (217 lb) 99.4 kg (219 lb 1.6 oz)    Examination:  General exam: Appears calm and comfortable  Respiratory system: diminished, no wheezing Cardiovascular system: S1 & S2 heard, RRR. No JVD, murmurs, rubs, gallops or clicks. No pedal edema. Gastrointestinal system: Abdomen is nondistended, soft and nontender. No organomegaly or masses felt. Normal bowel sounds heard. Central nervous system: Alert and oriented. No focal neurological deficits.    Data Reviewed: I have personally reviewed following labs and imaging studies  CBC:  Recent Labs Lab 04/25/16 1750 04/26/16 0513  WBC 7.4 6.7  NEUTROABS 4.2  --   HGB 12.3 11.7*  HCT 38.4 35.9*  MCV 88.5 89.1  PLT 253 [id number]   Basic Metabolic Panel:  Recent Labs Lab 04/25/16 1750 04/26/16 0513  NA 136 135  K 4.2 4.3  CL 105 101  CO2 23 27  GLUCOSE 124* 95  BUN 20 20  CREATININE 1.21* 1.18*  CALCIUM 9.7 9.5   GFR: Estimated Creatinine Clearance: 50.5 mL/min (by C-G formula based on SCr of 1.18 mg/dL (H)). Liver Function Tests: No results for input(s): AST, ALT, ALKPHOS, BILITOT,  PROT, ALBUMIN in the last 168 hours. No results for input(s): LIPASE, AMYLASE in the last 168 hours. No results for input(s): AMMONIA in the last 168 hours. Coagulation Profile: No results for input(s): INR, PROTIME in the last 168 hours. Cardiac Enzymes:  Recent Labs Lab 04/25/16 2346 04/26/16 0513  TROPONINI 0.03* 0.03*   BNP (last 3 results) No results for input(s): PROBNP in the last 8760 hours. HbA1C: No results for input(s): HGBA1C in the last 72 hours. CBG: No results for input(s): GLUCAP in the last 168 hours. Lipid Profile: No results for input(s): CHOL, HDL, LDLCALC, TRIG, CHOLHDL, LDLDIRECT in the last 72 hours. Thyroid Function Tests:  Recent Labs  04/25/16 2346  TSH 4.732*   Anemia Panel: No results for input(s): VITAMINB12, FOLATE, FERRITIN, TIBC, IRON,  RETICCTPCT in the last 72 hours. Urine analysis:    Component Value Date/Time   COLORURINE YELLOW 09/22/2015 1822   APPEARANCEUR CLEAR 09/22/2015 1822   LABSPEC 1.010 09/22/2015 1822   PHURINE 6.5 09/22/2015 1822   GLUCOSEU NEGATIVE 09/22/2015 1822   GLUCOSEU NEGATIVE 09/22/2010 1456   HGBUR NEGATIVE 09/22/2015 1822   BILIRUBINUR NEGATIVE 09/22/2015 1822   BILIRUBINUR neg 08/13/2012 2027   KETONESUR NEGATIVE 09/22/2015 1822   PROTEINUR NEGATIVE 09/22/2015 1822   UROBILINOGEN 0.2 01/10/2015 0915   NITRITE NEGATIVE 09/22/2015 1822   LEUKOCYTESUR MODERATE (A) 09/22/2015 1822     )No results found for this or any previous visit (from the past 240 hour(s)).    Anti-infectives    Start     Dose/Rate Route Frequency Ordered Stop   04/26/16 0600  doxycycline (VIBRAMYCIN) 100 mg in dextrose 5 % 250 mL IVPB     100 mg 125 mL/hr over 120 Minutes Intravenous Every 12 hours 04/26/16 0556         Radiology Studies: Dg Chest 2 View  Result Date: 04/25/2016 CLINICAL DATA:  Dyspnea x1 week.  Fluid 4 weeks ago. EXAM: CHEST  2 VIEW COMPARISON:  12/26/2015 FINDINGS: The heart is normal in size. There is aortic atherosclerosis. The patient is status post CABG as well as atrial appendage clipping. Mild diffuse interstitial prominence is noted suggesting bronchitic change. Minimal superimposed airspace opacities in the periphery of the right upper lobe. No pleural or pneumothorax. No acute osseous abnormality. IMPRESSION: Minimal right upper lobe pneumonic consolidation superimposed on bronchitic change. Electronically Signed   By: Ashley Royalty M.D.   On: 04/25/2016 18:09        Scheduled Meds: . apixaban  5 mg Oral BID  . aspirin EC  81 mg Oral Daily  . doxycycline (VIBRAMYCIN) IV  100 mg Intravenous Q12H  . ferrous sulfate  325 mg Oral q morning - 10a  . fluticasone  1 spray Each Nare Daily  . furosemide  40 mg Oral Daily  . guaiFENesin  600 mg Oral BID  . [START ON 04/27/2016] Influenza  vac split quadrivalent PF  0.5 mL Intramuscular Tomorrow-1000  . lisinopril  20 mg Oral Daily  . metoprolol tartrate  25 mg Oral BID  . nitrofurantoin  100 mg Oral Q2000  . pantoprazole  40 mg Oral Daily  . [START ON 04/27/2016] pneumococcal 23 valent vaccine  0.5 mL Intramuscular Tomorrow-1000  . polyethylene glycol  17 g Oral Daily  . potassium chloride  10 mEq Oral Daily  . pramipexole  1-3 mg Oral QHS  . venlafaxine  37.5 mg Oral BID   Continuous Infusions:   LOS: 0 days    Time spent:  25 min    [place], DO [facility] Pager [phone]  If 7PM-7AM, please contact night-coverage [email or web address] Password [email or web address] 04/26/2016, 10:36 AM

## 2016-04-26 NOTE — Care Management Obs Status (Signed)
[facility] NOTIFICATION   Patient Details  Name: [name] MRN: [id number] Date of Birth: 04-20-1941   Medicare Observation Status Notification Given:  Yes    Bethena Roys, RN 04/26/2016, 12:13 PM

## 2016-04-26 NOTE — Discharge Instructions (Signed)

## 2016-04-27 ENCOUNTER — Observation Stay (HOSPITAL_BASED_OUTPATIENT_CLINIC_OR_DEPARTMENT_OTHER): Payer: Medicare Other

## 2016-04-27 DIAGNOSIS — I2510 Atherosclerotic heart disease of native coronary artery without angina pectoris: Secondary | ICD-10-CM | POA: Diagnosis present

## 2016-04-27 DIAGNOSIS — Z87891 Personal history of nicotine dependence: Secondary | ICD-10-CM | POA: Diagnosis not present

## 2016-04-27 DIAGNOSIS — M797 Fibromyalgia: Secondary | ICD-10-CM | POA: Diagnosis present

## 2016-04-27 DIAGNOSIS — R0602 Shortness of breath: Secondary | ICD-10-CM | POA: Diagnosis not present

## 2016-04-27 DIAGNOSIS — R0609 Other forms of dyspnea: Secondary | ICD-10-CM | POA: Diagnosis not present

## 2016-04-27 DIAGNOSIS — J189 Pneumonia, unspecified organism: Secondary | ICD-10-CM | POA: Diagnosis present

## 2016-04-27 DIAGNOSIS — F329 Major depressive disorder, single episode, unspecified: Secondary | ICD-10-CM | POA: Diagnosis present

## 2016-04-27 DIAGNOSIS — R0600 Dyspnea, unspecified: Secondary | ICD-10-CM

## 2016-04-27 DIAGNOSIS — Z8249 Family history of ischemic heart disease and other diseases of the circulatory system: Secondary | ICD-10-CM | POA: Diagnosis not present

## 2016-04-27 DIAGNOSIS — H409 Unspecified glaucoma: Secondary | ICD-10-CM | POA: Diagnosis present

## 2016-04-27 DIAGNOSIS — Z23 Encounter for immunization: Secondary | ICD-10-CM | POA: Diagnosis not present

## 2016-04-27 DIAGNOSIS — N183 Chronic kidney disease, stage 3 (moderate): Secondary | ICD-10-CM | POA: Diagnosis present

## 2016-04-27 DIAGNOSIS — E785 Hyperlipidemia, unspecified: Secondary | ICD-10-CM | POA: Diagnosis present

## 2016-04-27 DIAGNOSIS — Z9981 Dependence on supplemental oxygen: Secondary | ICD-10-CM | POA: Diagnosis not present

## 2016-04-27 DIAGNOSIS — G4733 Obstructive sleep apnea (adult) (pediatric): Secondary | ICD-10-CM | POA: Diagnosis present

## 2016-04-27 DIAGNOSIS — I130 Hypertensive heart and chronic kidney disease with heart failure and stage 1 through stage 4 chronic kidney disease, or unspecified chronic kidney disease: Secondary | ICD-10-CM | POA: Diagnosis present

## 2016-04-27 DIAGNOSIS — I4891 Unspecified atrial fibrillation: Secondary | ICD-10-CM | POA: Diagnosis present

## 2016-04-27 DIAGNOSIS — R5382 Chronic fatigue, unspecified: Secondary | ICD-10-CM | POA: Diagnosis present

## 2016-04-27 DIAGNOSIS — K219 Gastro-esophageal reflux disease without esophagitis: Secondary | ICD-10-CM | POA: Diagnosis present

## 2016-04-27 DIAGNOSIS — I951 Orthostatic hypotension: Secondary | ICD-10-CM | POA: Diagnosis present

## 2016-04-27 DIAGNOSIS — I10 Essential (primary) hypertension: Secondary | ICD-10-CM | POA: Diagnosis not present

## 2016-04-27 DIAGNOSIS — Z7901 Long term (current) use of anticoagulants: Secondary | ICD-10-CM | POA: Diagnosis not present

## 2016-04-27 DIAGNOSIS — Z7982 Long term (current) use of aspirin: Secondary | ICD-10-CM | POA: Diagnosis not present

## 2016-04-27 DIAGNOSIS — I5032 Chronic diastolic (congestive) heart failure: Secondary | ICD-10-CM | POA: Diagnosis present

## 2016-04-27 DIAGNOSIS — Z951 Presence of aortocoronary bypass graft: Secondary | ICD-10-CM | POA: Diagnosis not present

## 2016-04-27 DIAGNOSIS — J209 Acute bronchitis, unspecified: Secondary | ICD-10-CM | POA: Diagnosis not present

## 2016-04-27 DIAGNOSIS — I480 Paroxysmal atrial fibrillation: Secondary | ICD-10-CM | POA: Diagnosis not present

## 2016-04-27 DIAGNOSIS — Z9119 Patient's noncompliance with other medical treatment and regimen: Secondary | ICD-10-CM | POA: Diagnosis not present

## 2016-04-27 DIAGNOSIS — Z79899 Other long term (current) drug therapy: Secondary | ICD-10-CM | POA: Diagnosis not present

## 2016-04-27 LAB — ECHOCARDIOGRAM COMPLETE
Height: 66 in
WEIGHTICAEL: 3510.4 [oz_av]

## 2016-04-27 LAB — GLUCOSE, CAPILLARY: Glucose-Capillary: 124 mg/dL — ABNORMAL HIGH (ref 65–99)

## 2016-04-27 MED ORDER — IPRATROPIUM-ALBUTEROL 0.5-2.5 (3) MG/3ML IN SOLN
3.0000 mL | Freq: Four times a day (QID) | RESPIRATORY_TRACT | Status: DC
  Administered 2016-04-27 – 2016-04-28 (×5): 3 mL via RESPIRATORY_TRACT
  Filled 2016-04-27 (×5): qty 3

## 2016-04-27 MED ORDER — FUROSEMIDE 10 MG/ML IJ SOLN
20.0000 mg | Freq: Two times a day (BID) | INTRAMUSCULAR | Status: AC
  Administered 2016-04-27 (×2): 20 mg via INTRAVENOUS
  Filled 2016-04-27 (×2): qty 2

## 2016-04-27 NOTE — Progress Notes (Signed)
  Echocardiogram 2D Echocardiogram has been performed.  [name] 04/27/2016, 11:24 AM

## 2016-04-27 NOTE — Progress Notes (Signed)
[facility]  PROGRESS NOTE  [name] [id number] DOB: 11/21/1940 DOA: 04/25/2016 PCP: Donnie Coffin, MD   Brief HPI:   75 y.o.femalewith CAD status post CABG this year in May 2017, atrial fibrillation, hypertension presents to the ER because of increasing shortness of breath. Patient states over the last 1 month patient has been having increasing exertional shortness of breath. Denies any productive cough chest pain fever chills. Shortness of breath at this time is also present at rest. Chest x-ray shows consolidation.  Patient has cough and congestion.  Patient is being admitted for further observation.    Subjective   Patient still continues to have dyspnea on exertion.   Assessment/Plan:     1. Acute bronchitis- improving, continue Mucinex, doxycycline, will start DuoNeb's every 6 hours. 2. Acute on chronic diastolic CHF-patient does have underlying diastolic dysfunction, echocardiogram is currently pending. Will change from by mouth Lasix to IV 20 mg every 12 hours 2. Follow BMP in a.m. 3. History of atrial fibrillation- status post Maze procedure, continue apixaban, metoprolol. Heart rate is controlled. 4. CAD status post CABG- stable, echocardiogram has been ordered during this admission. Cardiac enzymes 3 negative.    DVT prophylaxis: *On eliquis for atrial fibrillation  Code Status: Full code  Family Communication: No family at bedside   Disposition Plan: Likely home in next 24 hours   Consultants:  None  Procedures:  None  Continuous infusions  KVO    Antibiotics:   Anti-infectives    Start     Dose/Rate Route Frequency Ordered Stop   04/26/16 0600  doxycycline (VIBRAMYCIN) 100 mg in dextrose 5 % 250 mL IVPB     100 mg 125 mL/hr over 120 Minutes Intravenous Every 12 hours 04/26/16 0556         Objective   Vitals:   04/26/16 1300 04/26/16 2000 04/26/16 2105 04/27/16 0500  BP: (!) 141/69 (!) 150/75 (!) 160/67 (!) 133/46  Pulse:  69 76 74 67  Resp: 15 20  (!) 21  Temp: 97.9 F (36.6 C) 97.7 F (36.5 C)  98.1 F (36.7 C)  TempSrc: Oral     SpO2: 97% 96%  95%  Weight:    99.5 kg (219 lb 6.4 oz)  Height:        Intake/Output Summary (Last 24 hours) at 04/27/16 1051 Last data filed at 04/27/16 0943  Gross per 24 hour  Intake             1062 ml  Output             1400 ml  Net             -338 ml   Filed Weights   04/25/16 1742 04/26/16 0414 04/27/16 0500  Weight: 98.4 kg (217 lb) 99.4 kg (219 lb 1.6 oz) 99.5 kg (219 lb 6.4 oz)     Physical Examination:  General exam: Appears calm and comfortable. Respiratory system: Mild crackles auscultated bilaterally in lung bases  Cardiovascular system:  RRR. No  murmurs, rubs, gallops. No pedal edema. GI system: Abdomen is nondistended, soft and nontender. No organomegaly.  Central nervous system. No focal neurological deficits. 5 x 5 power in all extremities. Skin: No rashes, lesions or ulcers. Psychiatry: Alert, oriented x 3.Judgement and insight appear normal. Affect normal.    Data Reviewed: I have personally reviewed following labs and imaging studies  CBG: No results for input(s): GLUCAP in the last 168 hours.  CBC:  Recent Labs Lab 04/25/16  1750 04/26/16 0513  WBC 7.4 6.7  NEUTROABS 4.2  --   HGB 12.3 11.7*  HCT 38.4 35.9*  MCV 88.5 89.1  PLT 253 [id number]    Basic Metabolic Panel:  Recent Labs Lab 04/25/16 1750 04/26/16 0513  NA 136 135  K 4.2 4.3  CL 105 101  CO2 23 27  GLUCOSE 124* 95  BUN 20 20  CREATININE 1.21* 1.18*  CALCIUM 9.7 9.5    No results found for this or any previous visit (from the past 240 hour(s)).   Liver Function Tests: No results for input(s): AST, ALT, ALKPHOS, BILITOT, PROT, ALBUMIN in the last 168 hours. No results for input(s): LIPASE, AMYLASE in the last 168 hours. No results for input(s): AMMONIA in the last 168 hours.  Cardiac Enzymes:  Recent Labs Lab 04/25/16 2346 04/26/16 0513 04/26/16 1110   TROPONINI 0.03* 0.03* <0.03   BNP (last 3 results)  Recent Labs  09/19/15 0432 10/13/15 1002 04/25/16 1750  BNP 46.8 178.3* 70.1    ProBNP (last 3 results) No results for input(s): PROBNP in the last 8760 hours.    Studies: Dg Chest 2 View  Result Date: 04/25/2016 CLINICAL DATA:  Dyspnea x1 week.  Fluid 4 weeks ago. EXAM: CHEST  2 VIEW COMPARISON:  12/26/2015 FINDINGS: The heart is normal in size. There is aortic atherosclerosis. The patient is status post CABG as well as atrial appendage clipping. Mild diffuse interstitial prominence is noted suggesting bronchitic change. Minimal superimposed airspace opacities in the periphery of the right upper lobe. No pleural or pneumothorax. No acute osseous abnormality. IMPRESSION: Minimal right upper lobe pneumonic consolidation superimposed on bronchitic change. Electronically Signed   By: Ashley Royalty M.D.   On: 04/25/2016 18:09    Scheduled Meds: . apixaban  5 mg Oral BID  . aspirin EC  81 mg Oral Daily  . doxycycline (VIBRAMYCIN) IV  100 mg Intravenous Q12H  . ferrous sulfate  325 mg Oral q morning - 10a  . fluticasone  1 spray Each Nare Daily  . furosemide  20 mg Intravenous Q12H  . guaiFENesin  600 mg Oral BID  . Influenza vac split quadrivalent PF  0.5 mL Intramuscular Tomorrow-1000  . lisinopril  20 mg Oral Daily  . metoprolol tartrate  25 mg Oral BID  . nitrofurantoin  100 mg Oral Q2000  . pantoprazole  40 mg Oral Daily  . pneumococcal 23 valent vaccine  0.5 mL Intramuscular Tomorrow-1000  . polyethylene glycol  17 g Oral Daily  . potassium chloride  10 mEq Oral Daily  . pramipexole  1-3 mg Oral QHS  . venlafaxine  37.5 mg Oral BID      Time spent: 25 min  [facility] Pager [phone]. If 7PM-7AM, please contact night-coverage at [email or web address], Office  [phone]  password [email or web address] 04/27/2016, 10:51 AM  LOS: 0 days

## 2016-04-28 DIAGNOSIS — R0609 Other forms of dyspnea: Secondary | ICD-10-CM

## 2016-04-28 LAB — BASIC METABOLIC PANEL
ANION GAP: 8 (ref 5–15)
BUN: 18 mg/dL (ref 6–20)
CALCIUM: 9.4 mg/dL (ref 8.9–10.3)
CO2: 25 mmol/L (ref 22–32)
Chloride: 99 mmol/L — ABNORMAL LOW (ref 101–111)
Creatinine, Ser: 1.23 mg/dL — ABNORMAL HIGH (ref 0.44–1.00)
GFR calc Af Amer: 49 mL/min — ABNORMAL LOW (ref >60)
GFR, EST NON AFRICAN AMERICAN: 42 mL/min — AB (ref >60)
GLUCOSE: 95 mg/dL (ref 65–99)
Potassium: 4.4 mmol/L (ref 3.5–5.1)
Sodium: 132 mmol/L — ABNORMAL LOW (ref 135–145)

## 2016-04-28 LAB — T4, FREE: FREE T4: 0.79 ng/dL (ref 0.61–1.12)

## 2016-04-28 MED ORDER — FUROSEMIDE 40 MG PO TABS
40.0000 mg | ORAL_TABLET | Freq: Every day | ORAL | Status: DC
  Administered 2016-04-28 – 2016-04-29 (×2): 40 mg via ORAL
  Filled 2016-04-28 (×2): qty 1

## 2016-04-28 MED ORDER — IPRATROPIUM-ALBUTEROL 0.5-2.5 (3) MG/3ML IN SOLN: 3.0000 mL | Freq: Four times a day (QID) | RESPIRATORY_TRACT | Status: DC | PRN

## 2016-04-28 MED ORDER — DOXYCYCLINE HYCLATE 100 MG PO TABS
100.0000 mg | ORAL_TABLET | Freq: Two times a day (BID) | ORAL | Status: DC
  Administered 2016-04-28 – 2016-04-29 (×2): 100 mg via ORAL
  Filled 2016-04-28 (×2): qty 1

## 2016-04-28 NOTE — Progress Notes (Signed)
Physical Therapy Evaluation Patient Details Name: [name] MRN: [id number] DOB: 01/02/1941 Today's Date: 04/28/2016   History of Present Illness  75 y.o. female with CAD s/p CABG in May 2017, atrial fibrillation, hypertention, HLD, fibromyalgia, and OSA, admitted to ER because of increasing shortness of breath x 1 month.   Clinical Impression  Patient complaint of needing frequent rest breaks, and difficulty accomplishing relatively simple tasks, finally getting to her.  Patient just completed breathing treatment prior to arrival.  Supervision assist to Independent for all mobility, but patient O2 saturation dropped from 98% to 93% with gait training, HR remained in appropriate range, required 3 standing rest breaks to ambulate 150 feet, with rest intervals of 30-60 seconds during activity, then 2-3 minutes following completion of activity. Patient will benefit from cardiopulmonary conditioning training, and energy conservation education to augment medical treatment while in hospital and then to follow up at home, to build overall endurance to function.     Follow Up Recommendations Home health PT (Focus on Activity tolerance/dosing, energy conservation.)    Equipment Recommendations  None recommended by PT    Recommendations for Other Services OT consult     Precautions / Restrictions Precautions Precautions: None Restrictions Weight Bearing Restrictions: No      Mobility  Bed Mobility Overal bed mobility: Independent                Transfers Overall transfer level: Independent Equipment used: None                Ambulation/Gait Ambulation/Gait assistance: Supervision Ambulation Distance (Feet): 150 Feet (with 3 standing rest breaks, O2 to 93%) Assistive device: None Gait Pattern/deviations: WFL(Within Functional Limits)   Gait velocity interpretation: at or above normal speed for age/gender General Gait Details: Mild lateral trunk sway with  ambulation  Stairs            Wheelchair Mobility    Modified Rankin (Stroke Patients Only)       Balance Overall balance assessment: No apparent balance deficits (not formally assessed)                                           Pertinent Vitals/Pain Pain Assessment: No/denies pain    Home Living Family/patient expects to be discharged to:: Private residence Living Arrangements: Other relatives (Granddaughter who works lives with her.) Available Help at Discharge: Family;Available PRN/intermittently Type of Home: House Home Access: Level entry     Home Layout: One level Home Equipment: None      Prior Function Level of Independence: Independent         Comments: Reports increasing fatigue with simple activities, frequent rest breaks.     Hand Dominance        Extremity/Trunk Assessment   Upper Extremity Assessment: Overall WFL for tasks assessed           Lower Extremity Assessment: Overall WFL for tasks assessed      Cervical / Trunk Assessment: Normal  Communication   Communication: No difficulties  Cognition Arousal/Alertness: Awake/alert Behavior During Therapy: WFL for tasks assessed/performed Overall Cognitive Status: Within Functional Limits for tasks assessed                 General Comments: Friendly, talkative    General Comments      Exercises     Assessment/Plan    PT Assessment All further PT  needs can be met in the next venue of care;Patient needs continued PT services  PT Problem List Decreased activity tolerance;Decreased balance;Decreased mobility;Decreased knowledge of precautions;Cardiopulmonary status limiting activity          PT Treatment Interventions Gait training;Functional mobility training;Therapeutic activities;Therapeutic exercise;Balance training;Patient/family education    PT Goals (Current goals can be found in the Care Plan section)  Acute Rehab PT Goals Patient Stated  Goal: Wants to be able to complete tasks without being so tired. PT Goal Formulation: With patient Time For Goal Achievement: 05/12/16 Potential to Achieve Goals: Good    Frequency Min 3X/week   Barriers to discharge        Co-evaluation               End of Session Equipment Utilized During Treatment: Gait belt Activity Tolerance: Patient limited by fatigue (Frequent rest breaks) Patient left: in chair;with call bell/phone within reach Nurse Communication: Mobility status         Time: 1415-1450 PT Time Calculation (min) (ACUTE ONLY): 35 min   Charges:   PT Evaluation $PT Eval Moderate Complexity: 1 Procedure PT Treatments $Gait Training: 8-22 mins   PT G CodesZenia Resides, [name] L 05-28-2016, 3:35 PM

## 2016-04-28 NOTE — Progress Notes (Signed)
Patient complained of minimum SOB when laying down. No notable distress was noted.Eventually it resolved and she was able to lay in the bed. Continued to monitor for patient comfort and safety.

## 2016-04-28 NOTE — Progress Notes (Signed)
PROGRESS NOTE    [name]  [id number] DOB: 1940-06-16 DOA: 04/25/2016 PCP: Donnie Coffin, MD     Brief Narrative:  [name] a 75 y.o.femalewith CAD status post CABG this year in May 2017, atrial fibrillation, hypertension presents to the ER because of increasing shortness of breath. Patient states over the last 1 month patient has been having increasing exertional shortness of breath. Denies any productive cough chest pain fever chills. Shortness of breath at this time is also present at rest. Chest x-ray shows consolidation.   Assessment & Plan:   Principal Problem:   Exertional dyspnea Active Problems:   Coronary artery disease   Hypertension   Hyperlipidemia   Orthostatic hypotension   Fibromyalgia   OSA (obstructive sleep apnea)   Atrial fibrillation (HCC)   CAD (coronary artery disease)   Dyspnea   Acute bronchitis and RUL CAP -Mucinex -Doxycycline -Duonebs  Chronic diastolic heart failure -EF [id number], grade 2 diastolic heart failure -BNP 70  -Continue lopressor, lisinopril, lasix  CKD stage 3 -Baseline Cr 1.1-1.3 -Stable   A Fib -S/p Maze procedure -Continue eliquis, metoprolol   CAD -S/p CABG -Troponin x 3 negative  -Continue aspirin   Elevated TSH  -T4 0.79   Depression/fibromyalgia/chronic fatigue syndrome  -Continue effexor -PT OT   OSA -Continue CPAP qhs   DVT prophylaxis: eliquis Code Status: Full Family Communication: No family at bedside Disposition Plan: Pending further improvement, likely discharge back home when stable   Consultants:   none  Procedures:   none  Antimicrobials:  Anti-infectives    Start     Dose/Rate Route Frequency Ordered Stop   04/26/16 0600  doxycycline (VIBRAMYCIN) 100 mg in dextrose 5 % 250 mL IVPB     100 mg 125 mL/hr over 120 Minutes Intravenous Every 12 hours 04/26/16 0556         Subjective: Patient states that she has had worsening shortness of breath in the past month. So  had d  hashortness of breath with exertion as well as doing activities of daily living and productive cough. No chest pain.  Objective: Vitals:   04/27/16 1300 04/27/16 1443 04/27/16 2100 04/28/16 0500  BP: (!) 123/53  (!) 175/58 (!) 155/51  Pulse: 65  75 76  Resp:   (!) 21 18  Temp: 97.5 F (36.4 C)  97.6 F (36.4 C) 97.7 F (36.5 C)  TempSrc: Oral     SpO2: 96% 96% 98% 95%  Weight:    99.1 kg (218 lb 6.4 oz)  Height:        Intake/Output Summary (Last 24 hours) at 04/28/16 0845 Last data filed at 04/28/16 0500  Gross per 24 hour  Intake             1402 ml  Output             3700 ml  Net            -2298 ml   Filed Weights   04/26/16 0414 04/27/16 0500 04/28/16 0500  Weight: 99.4 kg (219 lb 1.6 oz) 99.5 kg (219 lb 6.4 oz) 99.1 kg (218 lb 6.4 oz)    Examination:  General exam: Appears calm and comfortable  Respiratory system: Clear to auscultation. Respiratory effort normal. Cardiovascular system: S1 & S2 heard, RRR. No JVD, murmurs, rubs, gallops or clicks. No pedal edema. Gastrointestinal system: Abdomen is nondistended, soft and nontender. No organomegaly or masses felt. Normal bowel sounds heard. Central nervous system: Alert and oriented.  No focal neurological deficits. Extremities: Symmetric 5 x 5 power. Skin: No rashes, lesions or ulcers Psychiatry: Judgement and insight appear normal. Mood & affect appropriate. Quite tearful during exam   Data Reviewed: I have personally reviewed following labs and imaging studies  CBC:  Recent Labs Lab 04/25/16 1750 04/26/16 0513  WBC 7.4 6.7  NEUTROABS 4.2  --   HGB 12.3 11.7*  HCT 38.4 35.9*  MCV 88.5 89.1  PLT 253 [id number]   Basic Metabolic Panel:  Recent Labs Lab 04/25/16 1750 04/26/16 0513 04/28/16 0452  NA 136 135 132*  K 4.2 4.3 4.4  CL 105 101 99*  CO2 23 27 25   GLUCOSE 124* 95 95  BUN 20 20 18   CREATININE 1.21* 1.18* 1.23*  CALCIUM 9.7 9.5 9.4   GFR: Estimated Creatinine Clearance: 47.6 mL/min (by  C-G formula based on SCr of 1.23 mg/dL (H)). Liver Function Tests: No results for input(s): AST, ALT, ALKPHOS, BILITOT, PROT, ALBUMIN in the last 168 hours. No results for input(s): LIPASE, AMYLASE in the last 168 hours. No results for input(s): AMMONIA in the last 168 hours. Coagulation Profile: No results for input(s): INR, PROTIME in the last 168 hours. Cardiac Enzymes:  Recent Labs Lab 04/25/16 2346 04/26/16 0513 04/26/16 1110  TROPONINI 0.03* 0.03* <0.03   BNP (last 3 results) No results for input(s): PROBNP in the last 8760 hours. HbA1C: No results for input(s): HGBA1C in the last 72 hours. CBG:  Recent Labs Lab 04/27/16 2111  GLUCAP 124*   Lipid Profile: No results for input(s): CHOL, HDL, LDLCALC, TRIG, CHOLHDL, LDLDIRECT in the last 72 hours. Thyroid Function Tests:  Recent Labs  04/25/16 2346  TSH 4.732*   Anemia Panel: No results for input(s): VITAMINB12, FOLATE, FERRITIN, TIBC, IRON, RETICCTPCT in the last 72 hours. Sepsis Labs: No results for input(s): PROCALCITON, LATICACIDVEN in the last 168 hours.  No results found for this or any previous visit (from the past 240 hour(s)).     Radiology Studies: No results found.    Scheduled Meds: . apixaban  5 mg Oral BID  . aspirin EC  81 mg Oral Daily  . doxycycline (VIBRAMYCIN) IV  100 mg Intravenous Q12H  . ferrous sulfate  325 mg Oral q morning - 10a  . fluticasone  1 spray Each Nare Daily  . guaiFENesin  600 mg Oral BID  . ipratropium-albuterol  3 mL Nebulization Q6H  . lisinopril  20 mg Oral Daily  . metoprolol tartrate  25 mg Oral BID  . nitrofurantoin  100 mg Oral Q2000  . pantoprazole  40 mg Oral Daily  . polyethylene glycol  17 g Oral Daily  . potassium chloride  10 mEq Oral Daily  . pramipexole  1-3 mg Oral QHS  . venlafaxine  37.5 mg Oral BID   Continuous Infusions:   LOS: 1 day    Time spent: 40 minutes   Claudia Phi, DO [facility] [email or web address] Password  [email or web address] 04/28/2016, 8:45 AM

## 2016-04-29 DIAGNOSIS — F32A Depression, unspecified: Secondary | ICD-10-CM | POA: Diagnosis present

## 2016-04-29 DIAGNOSIS — F329 Major depressive disorder, single episode, unspecified: Secondary | ICD-10-CM | POA: Diagnosis present

## 2016-04-29 DIAGNOSIS — J209 Acute bronchitis, unspecified: Principal | ICD-10-CM

## 2016-04-29 DIAGNOSIS — G9332 Myalgic encephalomyelitis/chronic fatigue syndrome: Secondary | ICD-10-CM | POA: Diagnosis present

## 2016-04-29 DIAGNOSIS — R5382 Chronic fatigue, unspecified: Secondary | ICD-10-CM | POA: Diagnosis present

## 2016-04-29 LAB — BASIC METABOLIC PANEL
ANION GAP: 7 (ref 5–15)
BUN: 20 mg/dL (ref 6–20)
CALCIUM: 9.1 mg/dL (ref 8.9–10.3)
CO2: 24 mmol/L (ref 22–32)
Chloride: 102 mmol/L (ref 101–111)
Creatinine, Ser: 1.29 mg/dL — ABNORMAL HIGH (ref 0.44–1.00)
GFR, EST AFRICAN AMERICAN: 46 mL/min — AB (ref >60)
GFR, EST NON AFRICAN AMERICAN: 40 mL/min — AB (ref >60)
Glucose, Bld: 104 mg/dL — ABNORMAL HIGH (ref 65–99)
POTASSIUM: 4.2 mmol/L (ref 3.5–5.1)
Sodium: 133 mmol/L — ABNORMAL LOW (ref 135–145)

## 2016-04-29 MED ORDER — DOXYCYCLINE HYCLATE 100 MG PO TABS
100.0000 mg | ORAL_TABLET | Freq: Two times a day (BID) | ORAL | 0 refills | Status: AC
Start: 2016-04-29 — End: 2016-05-02

## 2016-04-29 NOTE — Discharge Summary (Signed)
Physician Discharge Summary  [name] [id number] DOB: [birth year] DOA: 04/25/2016  PCP: Donnie Coffin, MD  Admit date: 04/25/2016 Discharge date: 04/29/2016  Admitted From: Home Disposition:  Home  Recommendations for Outpatient Follow-up:  1. Follow up with PCP in 1-2 weeks 2. Please obtain BMP in one week   Home Health: HHPT   Equipment/Devices: None    Discharge Condition: Stable  CODE STATUS: Full  Diet recommendation: Heart healthy   Brief/Interim Summary: [name] is a 75 y.o. female with CAD status post CABG this year in May 2017, atrial fibrillation, hypertension presents to the ER because of increasing shortness of breath. Patient states over the last 1 month patient has been having increasing exertional shortness of breath. Denies any productive cough chest pain fever chills. Shortness of breath at this time is also present at rest. Patient was admitted for further observation and treatment of bronchitis vs CAP. She was treated with doxycycyline as well as IV lasix for improvement in dyspnea. She worked with PT and was recommended for HHPT.   Subjective on day of discharge: States breathing has improved since admission. Has dry cough, no chest pain, nausea. No complaints today.   Discharge Diagnoses:  Principal Problem:   Acute bronchitis Active Problems:   Hypertension   Hyperlipidemia   Fibromyalgia   OSA (obstructive sleep apnea)   Atrial fibrillation (HCC)   CAD (coronary artery disease)   Exertional dyspnea   Depression   Chronic fatigue syndrome  Acute bronchitis and RUL CAP -Mucinex -Duonebs -Doxycycline  Chronic diastolic heart failure -EF [id number], grade 2 diastolic heart failure -BNP 70  -Continue lopressor, lisinopril, lasix  CKD stage 3 -Baseline Cr 1.1-1.3 -Stable   A Fib -S/p Maze procedure -Continue eliquis, metoprolol   CAD -S/p CABG -Troponin x 3 negative  -Continue aspirin   Elevated TSH  -T4 0.79    Depression/fibromyalgia/chronic fatigue syndrome  -Continue effexor -PT OT   OSA -Continue CPAP qhs    Discharge Instructions  Discharge Instructions    Diet - low sodium heart healthy    Complete by:  As directed    Face-to-face encounter (required for Medicare/Medicaid patients)    Complete by:  As directed    I Dessa Phi certify that this patient is under my care and that I, or a nurse practitioner or physician's assistant working with me, had a face-to-face encounter that meets the physician face-to-face encounter requirements with this patient on 04/29/2016. The encounter with the patient was in whole, or in part for the following medical condition(s) which is the primary reason for home health care (List medical condition):  Principal Problem:   Acute bronchitis Active Problems:   Hypertension   Hyperlipidemia   Fibromyalgia   OSA (obstructive sleep apnea)   Atrial fibrillation (HCC)   CAD (coronary artery disease)   Exertional dyspnea   Depression   Chronic fatigue syndrome   The encounter with the patient was in whole, or in part, for the following medical condition, which is the primary reason for home health care:  chronic fatigue syndrome, fibromyalgia, exertional dyspnea   I certify that, based on my findings, the following services are medically necessary home health services:  Physical therapy   Reason for Medically Necessary Home Health Services:  Therapy- Therapeutic Exercises to Increase Strength and Endurance   My clinical findings support the need for the above services:  Shortness of breath with activity   Further, I certify that my clinical findings support that this  patient is homebound due to:  Shortness of Breath with activity   Home Health    Complete by:  As directed    To provide the following care/treatments:  PT   Increase activity slowly    Complete by:  As directed        Medication List    TAKE these medications   acetaminophen 500  MG tablet Commonly known as:  TYLENOL Take 1,000 mg by mouth every 8 (eight) hours as needed for moderate pain.   apixaban 5 MG Tabs tablet Commonly known as:  ELIQUIS TAKE 1 TABLET(5 MG) BY MOUTH TWICE DAILY   aspirin 81 MG EC tablet Take 1 tablet (81 mg total) by mouth daily.   doxycycline 100 MG tablet Commonly known as:  VIBRA-TABS Take 1 tablet (100 mg total) by mouth every 12 (twelve) hours.   ferrous sulfate 325 (65 FE) MG tablet Take 325 mg by mouth every morning.   furosemide 40 MG tablet Commonly known as:  LASIX Take 1 tablet (40 mg total) by mouth daily.   lisinopril 20 MG tablet Commonly known as:  PRINIVIL,ZESTRIL Take 1 tablet (20 mg total) by mouth daily.   metoprolol tartrate 25 MG tablet Commonly known as:  LOPRESSOR Take 1 tablet (25 mg total) by mouth 2 (two) times daily.   nitrofurantoin 100 MG capsule Commonly known as:  MACRODANTIN Take 100 mg by mouth daily.   nitroGLYCERIN 0.4 MG SL tablet Commonly known as:  NITROSTAT Place 1 tablet (0.4 mg total) under the tongue every 5 (five) minutes as needed for chest pain.   pantoprazole 40 MG tablet Commonly known as:  PROTONIX Take 40 mg by mouth daily.   polyethylene glycol packet Commonly known as:  MIRALAX / GLYCOLAX Take 17 g by mouth daily.   potassium chloride 10 MEQ tablet Commonly known as:  K-DUR Take 1 tablet (10 mEq total) by mouth daily.   pramipexole 1 MG tablet Commonly known as:  MIRAPEX Take 1-3 mg by mouth at bedtime.   traMADol 50 MG tablet Commonly known as:  ULTRAM Take one tablet by mouth every 4 hours as needed for moderate pain; Take two tablets by mouth every 4 hours as needed for severe pain   venlafaxine 37.5 MG tablet Commonly known as:  EFFEXOR Take 37.5 mg by mouth 2 (two) times daily.   VITAMIN B-12 PO Take 1 tablet by mouth daily.      Follow-up Information    Donnie Coffin, MD. Schedule an appointment as soon as possible for a visit in 1 week(s).    Specialty:  Family Medicine Contact information: [address] [place] [address] [phone]          Allergies  Allergen Reactions  . Other Nausea Only    NO MEAT!!  . Sulfamethoxazole-Trimethoprim Nausea Only  . Cortisone Nausea Only and Other (See Comments)    Pt gets very hot and flushed  . Latex Rash    Redness and irritation  . Tape Rash    Redness and irritation    Consultations:  None   Procedures/Studies: Dg Chest 2 View  Result Date: 04/25/2016 CLINICAL DATA:  Dyspnea x1 week.  Fluid 4 weeks ago. EXAM: CHEST  2 VIEW COMPARISON:  12/26/2015 FINDINGS: The heart is normal in size. There is aortic atherosclerosis. The patient is status post CABG as well as atrial appendage clipping. Mild diffuse interstitial prominence is noted suggesting bronchitic change. Minimal superimposed airspace opacities in the  periphery of the right upper lobe. No pleural or pneumothorax. No acute osseous abnormality. IMPRESSION: Minimal right upper lobe pneumonic consolidation superimposed on bronchitic change. Electronically Signed   By: Ashley Royalty M.D.   On: 04/25/2016 18:09       Discharge Exam: Vitals:   04/28/16 2100 04/29/16 0500  BP: (!) 156/56 (!) 169/76  Pulse: 76 84  Resp: 19 20  Temp: 97.7 F (36.5 C) 97.8 F (36.6 C)   Vitals:   04/28/16 0918 04/28/16 1515 04/28/16 2100 04/29/16 0500  BP:  112/67 (!) 156/56 (!) 169/76  Pulse:  63 76 84  Resp:  16 19 20   Temp:  98.8 F (37.1 C) 97.7 F (36.5 C) 97.8 F (36.6 C)  TempSrc:  Axillary    SpO2: 96% 99% 98% 96%  Weight:    99.3 kg (219 lb)  Height:        General: Pt is alert, awake, not in acute distress Cardiovascular: RRR, S1/S2 +, no rubs, no gallops Respiratory: CTA bilaterally, no wheezing, no rhonchi Abdominal: Soft, NT, ND, bowel sounds + Extremities: no edema, no cyanosis    The results of significant diagnostics from this hospitalization (including imaging, microbiology,  ancillary and laboratory) are listed below for reference.     Microbiology: No results found for this or any previous visit (from the past 240 hour(s)).   Labs: BNP (last 3 results)  Recent Labs  09/19/15 0432 10/13/15 1002 04/25/16 1750  BNP 46.8 178.3* [id number]   Basic Metabolic Panel:  Recent Labs Lab 04/25/16 1750 04/26/16 0513 04/28/16 0452 04/29/16 0307  NA 136 135 132* 133*  K 4.2 4.3 4.4 4.2  CL 105 101 99* 102  CO2 23 27 25 24   GLUCOSE 124* 95 95 104*  BUN 20 20 18 20   CREATININE 1.21* 1.18* 1.23* 1.29*  CALCIUM 9.7 9.5 9.4 9.1   Liver Function Tests: No results for input(s): AST, ALT, ALKPHOS, BILITOT, PROT, ALBUMIN in the last 168 hours. No results for input(s): LIPASE, AMYLASE in the last 168 hours. No results for input(s): AMMONIA in the last 168 hours. CBC:  Recent Labs Lab 04/25/16 1750 04/26/16 0513  WBC 7.4 6.7  NEUTROABS 4.2  --   HGB 12.3 11.7*  HCT 38.4 35.9*  MCV 88.5 89.1  PLT 253 217   Cardiac Enzymes:  Recent Labs Lab 04/25/16 2346 04/26/16 0513 04/26/16 1110  TROPONINI 0.03* 0.03* <0.03   BNP: Invalid input(s): POCBNP CBG:  Recent Labs Lab 04/27/16 2111  GLUCAP 124*   D-Dimer No results for input(s): DDIMER in the last 72 hours. Hgb A1c No results for input(s): HGBA1C in the last 72 hours. Lipid Profile No results for input(s): CHOL, HDL, LDLCALC, TRIG, CHOLHDL, LDLDIRECT in the last 72 hours. Thyroid function studies No results for input(s): TSH, T4TOTAL, T3FREE, THYROIDAB in the last 72 hours.  Invalid input(s): FREET3 Anemia work up No results for input(s): VITAMINB12, FOLATE, FERRITIN, TIBC, IRON, RETICCTPCT in the last 72 hours. Urinalysis    Component Value Date/Time   COLORURINE YELLOW 09/22/2015 1822   APPEARANCEUR CLEAR 09/22/2015 1822   LABSPEC 1.010 09/22/2015 1822   PHURINE 6.5 09/22/2015 1822   GLUCOSEU NEGATIVE 09/22/2015 1822   GLUCOSEU NEGATIVE 09/22/2010 1456   HGBUR NEGATIVE 09/22/2015 1822    BILIRUBINUR NEGATIVE 09/22/2015 1822   BILIRUBINUR neg 08/13/2012 2027   KETONESUR NEGATIVE 09/22/2015 1822   PROTEINUR NEGATIVE 09/22/2015 1822   UROBILINOGEN 0.2 01/10/2015 0915   NITRITE NEGATIVE 09/22/2015 1822  LEUKOCYTESUR MODERATE (A) 09/22/2015 1822   Sepsis Labs Invalid input(s): PROCALCITONIN,  WBC,  LACTICIDVEN Microbiology No results found for this or any previous visit (from the past 240 hour(s)).   Time coordinating discharge: Over 30 minutes  SIGNED:  Dessa Phi, DO [facility] Pager [phone]  If 7PM-7AM, please contact night-coverage [email or web address] Password [email or web address] 04/29/2016, 8:14 AM

## 2016-04-29 NOTE — Progress Notes (Signed)
Occupational Therapy Treatment Patient Details Name: [name] MRN: [id number] DOB: 07/13/1940 Today's Date: 04/29/2016    History of present illness 75 y.o. female with CAD s/p CABG in May 2017, atrial fibrillation, hypertention, HLD, fibromyalgia, and OSA, admitted to ER because of increasing shortness of breath x 1 month.    OT comments  Pt is performing at a modified independent level. All education completed. No further OT needs.  Follow Up Recommendations  No OT follow up    Equipment Recommendations  None recommended by OT    Recommendations for Other Services      Precautions / Restrictions Precautions Precautions: None       Mobility Bed Mobility                  Transfers Overall transfer level: Independent Equipment used: None                  Balance                                   ADL Overall ADL's : Modified independent                                       General ADL Comments: Educated in energy conservation and gave pt handout to reinforce. Recommended pt only get down in tub to bathe when granddaughter is home. Pt does not want to shower.      Vision                     Perception     Praxis      Cognition   Behavior During Therapy: WFL for tasks assessed/performed Overall Cognitive Status: Within Functional Limits for tasks assessed                       Extremity/Trunk Assessment  Upper Extremity Assessment Upper Extremity Assessment: Overall WFL for tasks assessed   Lower Extremity Assessment Lower Extremity Assessment: Overall WFL for tasks assessed   Cervical / Trunk Assessment Cervical / Trunk Assessment: Normal    Exercises     Shoulder Instructions       General Comments      Pertinent Vitals/ Pain       Pain Assessment: No/denies pain  Home Living Family/patient expects to be discharged to:: Private residence Living Arrangements: Other  relatives (granddaughter) Available Help at Discharge: Family;Available PRN/intermittently Type of Home: Apartment Home Access: Level entry     Home Layout: One level     Bathroom Shower/Tub: Teacher, early years/pre: Standard     Home Equipment: None   Additional Comments: likes to paint      Prior Functioning/Environment Level of Independence: Independent        Comments: Reports increasing fatigue with simple activities, frequent rest breaks.   Frequency           Progress Toward Goals  OT Goals(current goals can now be found in the care plan section)     Acute Rehab OT Goals Patient Stated Goal: Wants to be able to complete tasks without being so tired.  Plan      Co-evaluation                 End of Session  Activity Tolerance Patient tolerated treatment well   Patient Left in chair   Nurse Communication          Time: [id number] OT Time Calculation (min): 20 min  Charges: OT General Charges $OT Visit: 1 Procedure OT Evaluation $OT Eval Low Complexity: 1 Procedure  Malka So 04/29/2016, 9:35 AM  [phone]

## 2016-04-29 NOTE — Progress Notes (Signed)
The client has been given discharge instructions with prescriptions to pick up at [facility]. Education on antibiotics and Eliquis printed and given. Telemetry monitor removed and CCMD notified.   Saddie Benders RN

## 2016-04-29 NOTE — Care Management Note (Signed)
Case Management Note  Patient Details  Name: [name] MRN: [id number] Date of Birth: 1940/08/04  Subjective/Objective:                  shortness of breath Action/Plan: Discharge planning Expected Discharge Date:  04/29/16               Expected Discharge Plan:  [place]  In-House Referral:  NA  Discharge planning Services  CM Consult  Post Acute Care Choice:  Home Health Choice offered to:  Patient  DME Arranged:  N/A DME Agency:  NA  HH Arranged:  PT [place] Agency:  [facility]  Status of Service:  Completed, signed off  If discussed at [place] of Stay Meetings, dates discussed:    Additional Comments: Spoke with pt to offer choice of home health agency.  Pt chooses [facility] to render HHPT.  Referral called to [facility] rep, Jermaine.  Pt states she has a rolling walker and denies need for a 3n1.  No other CM needs were communicated. Dellie Catholic, RN 04/29/2016, 8:47 AM

## 2016-05-01 DIAGNOSIS — I4891 Unspecified atrial fibrillation: Secondary | ICD-10-CM | POA: Diagnosis not present

## 2016-05-01 DIAGNOSIS — I130 Hypertensive heart and chronic kidney disease with heart failure and stage 1 through stage 4 chronic kidney disease, or unspecified chronic kidney disease: Secondary | ICD-10-CM | POA: Diagnosis not present

## 2016-05-01 DIAGNOSIS — J209 Acute bronchitis, unspecified: Secondary | ICD-10-CM | POA: Diagnosis not present

## 2016-05-01 DIAGNOSIS — K219 Gastro-esophageal reflux disease without esophagitis: Secondary | ICD-10-CM | POA: Diagnosis not present

## 2016-05-01 DIAGNOSIS — J189 Pneumonia, unspecified organism: Secondary | ICD-10-CM | POA: Diagnosis not present

## 2016-05-01 DIAGNOSIS — Z87891 Personal history of nicotine dependence: Secondary | ICD-10-CM | POA: Diagnosis not present

## 2016-05-01 DIAGNOSIS — M150 Primary generalized (osteo)arthritis: Secondary | ICD-10-CM | POA: Diagnosis not present

## 2016-05-01 DIAGNOSIS — I2510 Atherosclerotic heart disease of native coronary artery without angina pectoris: Secondary | ICD-10-CM | POA: Diagnosis not present

## 2016-05-01 DIAGNOSIS — E669 Obesity, unspecified: Secondary | ICD-10-CM | POA: Diagnosis not present

## 2016-05-01 DIAGNOSIS — Z951 Presence of aortocoronary bypass graft: Secondary | ICD-10-CM | POA: Diagnosis not present

## 2016-05-01 DIAGNOSIS — Z9981 Dependence on supplemental oxygen: Secondary | ICD-10-CM | POA: Diagnosis not present

## 2016-05-01 DIAGNOSIS — I5032 Chronic diastolic (congestive) heart failure: Secondary | ICD-10-CM | POA: Diagnosis not present

## 2016-05-01 DIAGNOSIS — G4733 Obstructive sleep apnea (adult) (pediatric): Secondary | ICD-10-CM | POA: Diagnosis not present

## 2016-05-01 DIAGNOSIS — N183 Chronic kidney disease, stage 3 (moderate): Secondary | ICD-10-CM | POA: Diagnosis not present

## 2016-05-01 DIAGNOSIS — M797 Fibromyalgia: Secondary | ICD-10-CM | POA: Diagnosis not present

## 2016-05-01 DIAGNOSIS — F419 Anxiety disorder, unspecified: Secondary | ICD-10-CM | POA: Diagnosis not present

## 2016-05-01 DIAGNOSIS — E785 Hyperlipidemia, unspecified: Secondary | ICD-10-CM | POA: Diagnosis not present

## 2016-05-01 DIAGNOSIS — Z7982 Long term (current) use of aspirin: Secondary | ICD-10-CM | POA: Diagnosis not present

## 2016-05-04 DIAGNOSIS — I130 Hypertensive heart and chronic kidney disease with heart failure and stage 1 through stage 4 chronic kidney disease, or unspecified chronic kidney disease: Secondary | ICD-10-CM | POA: Diagnosis not present

## 2016-05-04 DIAGNOSIS — N183 Chronic kidney disease, stage 3 (moderate): Secondary | ICD-10-CM | POA: Diagnosis not present

## 2016-05-04 DIAGNOSIS — I2510 Atherosclerotic heart disease of native coronary artery without angina pectoris: Secondary | ICD-10-CM | POA: Diagnosis not present

## 2016-05-04 DIAGNOSIS — J209 Acute bronchitis, unspecified: Secondary | ICD-10-CM | POA: Diagnosis not present

## 2016-05-04 DIAGNOSIS — I5032 Chronic diastolic (congestive) heart failure: Secondary | ICD-10-CM | POA: Diagnosis not present

## 2016-05-04 DIAGNOSIS — J189 Pneumonia, unspecified organism: Secondary | ICD-10-CM | POA: Diagnosis not present

## 2016-05-07 DIAGNOSIS — K121 Other forms of stomatitis: Secondary | ICD-10-CM | POA: Diagnosis not present

## 2016-05-07 DIAGNOSIS — J189 Pneumonia, unspecified organism: Secondary | ICD-10-CM | POA: Diagnosis not present

## 2016-05-07 DIAGNOSIS — R5383 Other fatigue: Secondary | ICD-10-CM | POA: Diagnosis not present

## 2016-05-10 DIAGNOSIS — I5032 Chronic diastolic (congestive) heart failure: Secondary | ICD-10-CM | POA: Diagnosis not present

## 2016-05-10 DIAGNOSIS — I130 Hypertensive heart and chronic kidney disease with heart failure and stage 1 through stage 4 chronic kidney disease, or unspecified chronic kidney disease: Secondary | ICD-10-CM | POA: Diagnosis not present

## 2016-05-10 DIAGNOSIS — J189 Pneumonia, unspecified organism: Secondary | ICD-10-CM | POA: Diagnosis not present

## 2016-05-10 DIAGNOSIS — J209 Acute bronchitis, unspecified: Secondary | ICD-10-CM | POA: Diagnosis not present

## 2016-05-10 DIAGNOSIS — I2510 Atherosclerotic heart disease of native coronary artery without angina pectoris: Secondary | ICD-10-CM | POA: Diagnosis not present

## 2016-05-10 DIAGNOSIS — N183 Chronic kidney disease, stage 3 (moderate): Secondary | ICD-10-CM | POA: Diagnosis not present

## 2016-05-11 DIAGNOSIS — N183 Chronic kidney disease, stage 3 (moderate): Secondary | ICD-10-CM | POA: Diagnosis not present

## 2016-05-11 DIAGNOSIS — I5032 Chronic diastolic (congestive) heart failure: Secondary | ICD-10-CM | POA: Diagnosis not present

## 2016-05-11 DIAGNOSIS — I2510 Atherosclerotic heart disease of native coronary artery without angina pectoris: Secondary | ICD-10-CM | POA: Diagnosis not present

## 2016-05-11 DIAGNOSIS — I130 Hypertensive heart and chronic kidney disease with heart failure and stage 1 through stage 4 chronic kidney disease, or unspecified chronic kidney disease: Secondary | ICD-10-CM | POA: Diagnosis not present

## 2016-05-11 DIAGNOSIS — J189 Pneumonia, unspecified organism: Secondary | ICD-10-CM | POA: Diagnosis not present

## 2016-05-11 DIAGNOSIS — J209 Acute bronchitis, unspecified: Secondary | ICD-10-CM | POA: Diagnosis not present

## 2016-05-17 DIAGNOSIS — N183 Chronic kidney disease, stage 3 (moderate): Secondary | ICD-10-CM | POA: Diagnosis not present

## 2016-05-17 DIAGNOSIS — J209 Acute bronchitis, unspecified: Secondary | ICD-10-CM | POA: Diagnosis not present

## 2016-05-17 DIAGNOSIS — I130 Hypertensive heart and chronic kidney disease with heart failure and stage 1 through stage 4 chronic kidney disease, or unspecified chronic kidney disease: Secondary | ICD-10-CM | POA: Diagnosis not present

## 2016-05-17 DIAGNOSIS — J189 Pneumonia, unspecified organism: Secondary | ICD-10-CM | POA: Diagnosis not present

## 2016-05-17 DIAGNOSIS — I5032 Chronic diastolic (congestive) heart failure: Secondary | ICD-10-CM | POA: Diagnosis not present

## 2016-05-17 DIAGNOSIS — I2510 Atherosclerotic heart disease of native coronary artery without angina pectoris: Secondary | ICD-10-CM | POA: Diagnosis not present

## 2016-06-14 DIAGNOSIS — J069 Acute upper respiratory infection, unspecified: Secondary | ICD-10-CM | POA: Diagnosis not present

## 2016-07-16 ENCOUNTER — Encounter: Payer: Self-pay | Admitting: Cardiovascular Disease

## 2016-07-16 ENCOUNTER — Ambulatory Visit (INDEPENDENT_AMBULATORY_CARE_PROVIDER_SITE_OTHER): Payer: Medicare Other | Admitting: Cardiovascular Disease

## 2016-07-16 VITALS — BP 150/60 | HR 74 | Ht 66.0 in | Wt 232.4 lb

## 2016-07-16 DIAGNOSIS — I480 Paroxysmal atrial fibrillation: Secondary | ICD-10-CM | POA: Diagnosis not present

## 2016-07-16 DIAGNOSIS — I2510 Atherosclerotic heart disease of native coronary artery without angina pectoris: Secondary | ICD-10-CM | POA: Diagnosis not present

## 2016-07-16 NOTE — Progress Notes (Signed)
Patient Name: [name] Date of Encounter: 07/16/2016  Primary Care Provider:  Donnie Coffin, MD Primary Cardiologist:  Joaquim Nam, MD  Problem List  1. CAD - s/p CABG Sep 22, 2015.  2. Hyperlipidemia 3. Obstructive sleep apnea 4. Hypertension 5. Atrial fib    August 25, 5059:  76 year old female with the above problem list.  She was in usual state of health about 3 weeks ago when she began to note mild lower extremity edema with bilateral ankle and calf pain with ambulation.  She takes her left leg has been more swollen than the right.  She has been relatively active recently partaking and gardening without any chest pain or dyspnea.  Her weight is actually down from baseline.  She's seen no change in her salt intake or amt of time that her legs might be in a dependent position.  She's quite flustered and anxious today.  She misplaced her phone and her dtr initially dropped her off @ the old [place] cardiology office and she had to walk down the street to here.  She doesn't know how to reach her dtr to have her come down and pick her up.  She's tearful and emotionally upset.  In that setting, after the visit, pt said that she thinks she's having chest pain.  An ecg was done, and before it was even completed, c/p resolved, as did her emotional upset.  Oct 08, 2012:  [name] is doing well. She's not had any episodes of chest pain or shortness of breath. She was seen by our PA last year. She has a history of coronary artery disease but has not had any recurrent angina. She's been working out in her garden without difficulties.  She has occasional episodes of orthostatic hypotension and also notes some occasional leg edema. I suspect this is from the amlodipine.  October 20, 2013:  [name] has seen [name] and was referred to [name] for management of her sleep apnea.   Her BP has been well controlled.  She needs to have a special dental appliance made for her OSA- did not tolerate the  CPAP. She has chronic dyspnea. No CP   She has found to have some thyroid issues.  Sees Dr. Alroy Dust for this.     December 29, 2014:  [name] is seen back today for evaulation of her CAD and HTN  And a recent episode of atrial fib. She was admitted and converted back to normal  ( ? No CP , BP has been well controlled    July 29, 2015:  Has chronic fatigue  Has had thyroid issues Doing well from a cardiac standpoint   Oct. 5, 2017:  [name] presented to the hospital with CP Sep 19, 2015. Cath showed prox LAD disease that was not amenable to PCI [name] had CABG  And MAZE procedure Sep 22, 2015. Complicated by a right radial hematoma - was eventually fixed on October 21, 2015   Nov. 15, 2017  [name] is doing well. She had coronary artery bypass grafting in May 2017. She has a history of atrial fibrillation and also had a MAZE  procedure with the coronary artery bypass grafting. We stopped her amiodarone at her last visit.  Feb. 26, 2018:  Doing ok Has had some leg swelling and BP is elevated.  Uses some salt .  No CP . Has had some DOE . Echo in Dec. 2017 shows normal LV systolic function with grade 2 Diastolic dysfunction  Past Medical History:  Diagnosis Date  . Anxiety   . Coronary artery disease    a. s/p MI 2009 - PCI/DES distal  RCA w/ 2.75 x 18 Xience DES;  b. 05/2010 Myoview: Apical thinning, EF 73%;  c. 06/2010 Cath: moderate nonobs dzs, EF 65%;  d.  Lexiscan Myoview (07/2013):  No scar or ischemia, EF 67%; Normal Study  . Environmental allergies   . Essential iris atrophy    Right side  . Fatigue   . Fibromyalgia   . GERD (gastroesophageal reflux disease)   . Glaucoma   . Hyperlipidemia   . Hypertension   . Menopause   . Obesity (BMI 30-39.9)   . OSA (obstructive sleep apnea)    does not wear CPAP  . Osteoarthritis   . Oxygen dependent    3L   Past Surgical History:  Procedure Laterality Date  . CARDIAC CATHETERIZATION  06/29/2010   Mild to moderate coronary artery  irregularities.  Her  proximal left anterior descending artery is moderately narrowed.  She  also has an eccentric stenosis in the proximal LAD.  These do not appear  to obstruct flow at present, but they are fairly small vessels.  They  appeared to be between 1.5 and 2 mm in diamete  . CARDIAC CATHETERIZATION  08/02/2009   Patent stent  . CARDIAC CATHETERIZATION  05/12/2008   Stent to the distal RCA  . CARDIAC CATHETERIZATION N/A 09/20/2015   Procedure: Left Heart Cath and Coronary Angiography;  Surgeon: Jettie Booze, MD;  Location: [facility];  Service: Cardiovascular;  Laterality: N/A;  . CARDIAC CATHETERIZATION  09/20/2015   Procedure: Intravascular Ultrasound/IVUS;  Surgeon: Jettie Booze, MD;  Location: [facility];  Service: Cardiovascular;;  . CARDIAC CATHETERIZATION  09/20/2015   Procedure: Intravascular Pressure Wire/FFR Study;  Surgeon: Jettie Booze, MD;  Location: [facility];  Service: Cardiovascular;;  . CLIPPING OF ATRIAL APPENDAGE N/A 09/23/2015   Procedure:  CLIPPING OF ATRIAL APPENDAGE;  Surgeon: Grace Isaac, MD;  Location: [facility];  Service: Open Heart Surgery;  Laterality: N/A;  . CORONARY ARTERY BYPASS GRAFT N/A 09/23/2015   Procedure: CORONARY ARTERY BYPASS GRAFTING (CABG) TIMES 1 USING LEFT INTERNAL MAMMARY TO THE LAD;  Surgeon: Grace Isaac, MD;  Location: [facility];  Service: Open Heart Surgery;  Laterality: N/A;  . MAZE N/A 09/23/2015   Procedure: MAZE;  Surgeon: Grace Isaac, MD;  Location: [facility];  Service: Open Heart Surgery;  Laterality: N/A;  . RESECTION OF ARTERIOVENOUS FISTULA ANEURYSM Right 10/21/2015   Procedure: SUTURE REPAIR OF RADIAL ARTERY AND EVACUATION OF HEMATOMA;  Surgeon: Mal Misty, MD;  Location: [facility];  Service: Vascular;  Laterality: Right;  . TEE WITHOUT CARDIOVERSION N/A 09/23/2015   Procedure: TRANSESOPHAGEAL ECHOCARDIOGRAM (TEE);  Surgeon: Grace Isaac, MD;  Location: [facility];  Service: Open Heart  Surgery;  Laterality: N/A;    Allergies  Allergies  Allergen Reactions  . Other Nausea Only    NO MEAT!!  . Sulfamethoxazole-Trimethoprim Nausea Only  . Cortisone Nausea Only and Other (See Comments)    Pt gets very hot and flushed  . Latex Rash    Redness and irritation  . Tape Rash    Redness and irritation      Home Medications  Prior to Admission medications   Medication Sig Start Date End Date Taking? Authorizing Provider  acetaminophen (TYLENOL) 500 MG tablet Take 500 mg by mouth as needed.  Yes Historical Provider, MD  amitriptyline (ELAVIL) 10 MG tablet Take 10 mg by mouth at bedtime.   Yes Historical Provider, MD  amLODipine (NORVASC) 5 MG tablet Take 1 tablet (5 mg total) by mouth daily. 09/03/11 09/02/12 Yes Thayer Headings, MD  aspirin 325 MG tablet Take 325 mg by mouth daily.     Yes Historical Provider, MD  carvedilol (COREG) 12.5 MG tablet Take 1 tablet (12.5 mg total) by mouth 2 (two) times daily. 09/03/11 09/02/12 Yes Thayer Headings, MD  clopidogrel (PLAVIX) 75 MG tablet TAKE ONE TABLET BY MOUTH DAILY 07/06/11  Yes Burtis Junes, NP  diclofenac sodium (VOLTAREN) 1 % GEL Apply topically. **CREAM** use as directed    Yes Historical Provider, MD  fluticasone (FLONASE) 50 MCG/ACT nasal spray Place 1 spray into the nose daily.   Yes Historical Provider, MD  hydrochlorothiazide 25 MG tablet Take 25 mg by mouth daily.    Yes Historical Provider, MD  HYDROcodone-homatropine (HYCODAN) 5-1.5 MG/5ML syrup Take 5 mLs by mouth every 8 (eight) hours as needed for cough. 08/29/11 09/08/11 Yes Robyn Haber, MD  IRON PO Take 1 tablet by mouth daily.     Yes Historical Provider, MD  levofloxacin (LEVAQUIN) 500 MG tablet Take 1 tablet (500 mg total) by mouth daily. 08/29/11 09/08/11 Yes Robyn Haber, MD  methimazole (TAPAZOLE) 5 MG tablet Take 5 mg by mouth daily.   Yes Historical Provider, MD  nitroGLYCERIN (NITROSTAT) 0.4 MG SL tablet Place 0.4 mg under the tongue every 5  (five) minutes as needed.     Yes Historical Provider, MD  pantoprazole (PROTONIX) 40 MG tablet Take 40 mg by mouth 2 (two) times daily.    Yes Historical Provider, MD  potassium chloride (KLOR-CON) 10 MEQ CR tablet Take 1 tablet (10 mEq total) by mouth daily. 02/23/11  Yes Thayer Headings, MD  pramipexole (MIRAPEX) 0.25 MG tablet Take 1 mg by mouth at bedtime.    Yes Historical Provider, MD  rosuvastatin (CRESTOR) 10 MG tablet Take 1 tablet (10 mg total) by mouth daily. 06/06/11  Yes Thayer Headings, MD  traMADol (ULTRAM) 50 MG tablet Take 50 mg by mouth 3 (three) times daily as needed.    Yes Historical Provider, MD  venlafaxine (EFFEXOR) 75 MG tablet Take 75 mg by mouth daily. 1/2 tablet 2 (two) times daily   Yes Historical Provider, MD  lisinopril (PRINIVIL,ZESTRIL) 40 MG tablet Take 1 tablet (40 mg total) by mouth daily. 09/07/11 09/06/12  Thayer Headings, MD     Physical Exam  Blood pressure (!) 150/60, pulse 74, height 5\' 6"  (1.676 m), weight 232 lb 6.4 oz (105.4 kg), SpO2 98 %.  General: Pleasant, NAD Psych: Normal affect. Neuro: Alert and oriented X 3. Moves all extremities spontaneously. HEENT: Normal  Neck: Supple without bruits or JVD. Lungs:  Resp regular and unlabored, few faint rales in bases.  Heart: RRR no s3, s4. 1/6 sem left sternal border  Abdomen: Soft, non-tender, non-distended, BS + x 4.  Extremities: No clubbing, cyanosis , trace bilateral leg edema.  Marland Kitchen DP/PT/Radials 2+ and equal bilaterally.  ECG   Assessment and Plan   1. Chronic diastolic congestive heart failure. She presents today with some symptoms consistent with volume overload. She's gained some weight. She has some leg swelling and she has shortness breath with exertion. We will have her double her furosemide and potassium for the next 3 days and see if this helps. If this seems to help  we may change her regimen to include double dose furosemide /potassium  several days a week.   2. CAD - she's not having  any episodes of chest discomfort. Her Plavix has been stopped because she's now on Eliquis. Continue current medications.  3. Hyperlipidemia - continue current medications. We'll plan on checking lipids again today   4. Obstructive sleep apnea-  Needs to get back to see her doctor who has been managing this   5. Hypertension -BP is a bit low and we need to start metoprolol. She will monitor her BP and keep a record. We can increase her meds at her next visit if needed    5. Atrial fib - Continue Eliquis.   She had a MAZE procedure with her surgery . She has maintained NSR   Will see her in 2-3 months      Mertie Moores, MD  07/16/2016 2:26 PM    [address],  [address], [place]  [id number] Pager [phone] Phone: [phone]; Fax: [phone]

## 2016-07-16 NOTE — Patient Instructions (Signed)
Medication Instructions:  Dr. Acie Fredrickson advised that you DOUBLE your Lasix and Potassium for the next 3 days and call Sharyn Lull on Thursday to report how you are doing   Labwork: None Ordered   Testing/Procedures: None Ordered   Follow-Up: Your physician recommends that you schedule a follow-up appointment in: 2 months with Dr. Acie Fredrickson   If you need a refill on your cardiac medications before your next appointment, please call your pharmacy.   Thank you for choosing [facility]! Christen Bame, RN [phone]

## 2016-08-09 ENCOUNTER — Ambulatory Visit (INDEPENDENT_AMBULATORY_CARE_PROVIDER_SITE_OTHER): Payer: Medicare Other

## 2016-08-09 ENCOUNTER — Ambulatory Visit (INDEPENDENT_AMBULATORY_CARE_PROVIDER_SITE_OTHER): Payer: Medicare Other | Admitting: Physician Assistant

## 2016-08-09 VITALS — BP 132/68 | HR 65 | Temp 97.6°F | Resp 16 | Ht 66.0 in | Wt 230.0 lb

## 2016-08-09 DIAGNOSIS — R062 Wheezing: Secondary | ICD-10-CM

## 2016-08-09 DIAGNOSIS — R059 Cough, unspecified: Secondary | ICD-10-CM

## 2016-08-09 DIAGNOSIS — R05 Cough: Secondary | ICD-10-CM

## 2016-08-09 MED ORDER — IPRATROPIUM BROMIDE 0.02 % IN SOLN
0.5000 mg | Freq: Once | RESPIRATORY_TRACT | Status: AC
  Administered 2016-08-09: 0.5 mg via RESPIRATORY_TRACT

## 2016-08-09 MED ORDER — ALBUTEROL SULFATE (2.5 MG/3ML) 0.083% IN NEBU
2.5000 mg | INHALATION_SOLUTION | Freq: Once | RESPIRATORY_TRACT | Status: AC
  Administered 2016-08-09: 2.5 mg via RESPIRATORY_TRACT

## 2016-08-09 MED ORDER — PREDNISONE 50 MG PO TABS: ORAL_TABLET | ORAL | 0 refills | Status: DC

## 2016-08-09 MED ORDER — DOXYCYCLINE HYCLATE 100 MG PO CAPS
100.0000 mg | ORAL_CAPSULE | Freq: Two times a day (BID) | ORAL | 0 refills | Status: AC
Start: 2016-08-09 — End: 2016-08-19

## 2016-08-09 NOTE — Progress Notes (Signed)
08/10/2016 10:53 AM   DOB: 10/21/1940 / MRN: [id number]  SUBJECTIVE:  [name] is a 76 y.o. female presenting for cough that started 3 weeks ago.  She though this was a 24 hour bug at that time.  She also had aches at that time but feels this illness resolved within about three days.   This illness started with cough three days ago.  She associates muscle aching and chills, and chest congestion.  She did have pneumonia in December and was hospitalized for this.    She is allergic to other; sulfamethoxazole-trimethoprim; cortisone; latex; and tape.   She  has a past medical history of Anxiety; Coronary artery disease; Environmental allergies; Essential iris atrophy; Fatigue; Fibromyalgia; GERD (gastroesophageal reflux disease); Glaucoma; Hyperlipidemia; Hypertension; Menopause; Obesity (BMI 30-39.9); OSA (obstructive sleep apnea); Osteoarthritis; and Oxygen dependent.    She  reports that she quit smoking about 55 years ago. Her smoking use included Cigarettes. She has never used smokeless tobacco. She reports that she does not drink alcohol or use drugs. She  has no sexual activity history on file. 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  Her family history includes Cancer in her father; Coronary artery disease in her brother, brother, brother, brother, and brother; Heart attack in her brother and mother.  Review of Systems  Respiratory: Positive for cough. Negative for hemoptysis, sputum production, shortness of breath and wheezing.    Cardiovascular: Negative for chest pain, orthopnea and leg swelling.  Neurological: Negative for dizziness.    The problem list and medications were reviewed and updated by myself where necessary and exist elsewhere in the encounter.   OBJECTIVE:  BP 132/68   Pulse 65   Temp 97.6 F (36.4 C) (Oral)   Resp 16   Ht 5\' 6"  (1.676 m)   Wt 230 lb (104.3 kg)   SpO2 97%   BMI 37.12 kg/m    Physical Exam  Constitutional: She is oriented to person, place, and time. She appears well-nourished. No distress.  Eyes: EOM are normal. Pupils are equal, round, and reactive to light.  Cardiovascular: Normal rate and regular rhythm.   Pulmonary/Chest: Effort normal and breath sounds normal.  Abdominal: She exhibits no distension.  Neurological: She is alert and oriented to person, place, and time. No cranial nerve deficit. Gait normal.  Skin: Skin is dry. She is not diaphoretic.  Psychiatric: She has a normal mood and affect.  Vitals reviewed.   Lab Results  Component Value Date   HGBA1C 5.8 (H) 09/23/2015   Lab Results  Component Value Date   CREATININE 1.29 (H) 04/29/2016      Results for orders placed or performed in visit on 08/09/16 (from the past 72 hour(s))  CBC with Differential/Platelet     Status: Abnormal   Collection Time: 08/09/16  5:43 PM  Result Value Ref Range   WBC 9.2 3.4 - 10.8 x10E3/uL   RBC 3.93 3.77 - 5.28 x10E6/uL   Hemoglobin 11.4 11.1 - 15.9 g/dL   Hematocrit 35.6 34.0 - 46.6 %   MCV 91 79 - 97 fL   MCH 29.0 26.6 - 33.0 pg   MCHC 32.0 31.5 - 35.7 g/dL   RDW 15.0  12.3 - 15.4 %   Platelets 233 150 - 379 x10E3/uL   Neutrophils 50 Not Estab. %   Lymphs 36 Not Estab. %   Monocytes 9 Not Estab. %   Eos 4 Not Estab. %   Basos 1 Not Estab. %   Neutrophils Absolute 4.6 1.4 - 7.0 x10E3/uL   Lymphocytes Absolute 3.3 (H) 0.7 - 3.1 x10E3/uL   Monocytes Absolute 0.8 0.1 - 0.9 x10E3/uL   EOS (ABSOLUTE) 0.4 0.0 - 0.4 x10E3/uL   Basophils Absolute 0.1 0.0 - 0.2  x10E3/uL   Immature Granulocytes 0 Not Estab. %   Immature Grans (Abs) 0.0 0.0 - 0.1 x10E3/uL    Dg Chest 2 View  Result Date: 08/09/2016 CLINICAL DATA:  Cough and wheezing EXAM: CHEST  2 VIEW COMPARISON:  04/25/2016 FINDINGS: Postsurgical changes of the mediastinum. No acute infiltrate or effusion. Bronchial thickening is unchanged. Stable borderline heart size with atherosclerosis. No pneumothorax. IMPRESSION: No active cardiopulmonary disease. Electronically Signed   By: Donavan Foil M.D.   On: 08/09/2016 17:58    ASSESSMENT AND PLAN:  [name] was seen today for flu like symptoms.  Diagnoses and all orders for this visit:  Cough: See prob 2.  -     DG Chest 2 View; Future -     Cancel: POCT CBC -     CBC with Differential/Platelet -     doxycycline (VIBRAMYCIN) 100 MG capsule; Take 1 capsule (100 mg total) by mouth 2 (two) times daily.  Wheezing: Improved post nebs however not resolved.  Sats up to 97% on room air upon leaving the clinic.  CBC suggesting a viral etiology at play. Will cover for atypical bacterial with doxy.  Starting pred.  She is not a diabetic.  History of MI late last year however heart function preserved on echo, no orthopnea or leg swelling, and she is not a diabetic. She will come back to clinic tomorrow to see PA Jacqulynn Cadet and appreciate her recs.  If patient is worsening holding or adjusting beta blocker may be off benefit if cards felt reasonable.  -     albuterol (PROVENTIL) (2.5 MG/3ML) 0.083% nebulizer solution 2.5 mg; Take 3 mLs (2.5 mg total) by nebulization once. -     ipratropium (ATROVENT) nebulizer solution 0.5 mg; Take 2.5 mLs (0.5 mg total) by nebulization once. -     CBC with Differential/Platelet -     albuterol (PROVENTIL) (2.5 MG/3ML) 0.083% nebulizer solution 2.5 mg; Take 3 mLs (2.5 mg total) by nebulization once. -     predniSONE (DELTASONE) 50 MG tablet; Take one daily for the next three days.    The patient is advised to call or return to  clinic if she does not see an improvement in symptoms, or to seek the care of the closest emergency department if she worsens with the above plan.   Philis Fendt, MHS, PA-C [facility] 08/10/2016 10:53 AM

## 2016-08-09 NOTE — Patient Instructions (Signed)
     IF you received an x-ray today, you will receive an invoice from [facility]. Please contact [facility] at [phone] with questions or concerns regarding your invoice.   IF you received labwork today, you will receive an invoice from [facility]. Please contact [facility] at [phone] with questions or concerns regarding your invoice.   Our billing staff will not be able to assist you with questions regarding bills from these companies.  You will be contacted with the lab results as soon as they are available. The fastest way to get your results is to activate your My Chart account. Instructions are located on the last page of this paperwork. If you have not heard from us regarding the results in 2 weeks, please contact this office.     

## 2016-08-10 ENCOUNTER — Telehealth: Payer: Self-pay | Admitting: Physician Assistant

## 2016-08-10 LAB — CBC WITH DIFFERENTIAL/PLATELET
BASOS ABS: 0.1 10*3/uL (ref 0.0–0.2)
Basos: 1 %
EOS (ABSOLUTE): 0.4 10*3/uL (ref 0.0–0.4)
Eos: 4 %
HEMATOCRIT: 35.6 % (ref 34.0–46.6)
Hemoglobin: 11.4 g/dL (ref 11.1–15.9)
Immature Grans (Abs): 0 10*3/uL (ref 0.0–0.1)
Immature Granulocytes: 0 %
LYMPHS ABS: 3.3 10*3/uL — AB (ref 0.7–3.1)
LYMPHS: 36 %
MCH: 29 pg (ref 26.6–33.0)
MCHC: 32 g/dL (ref 31.5–35.7)
MCV: 91 fL (ref 79–97)
MONOS ABS: 0.8 10*3/uL (ref 0.1–0.9)
Monocytes: 9 %
NEUTROS PCT: 50 %
Neutrophils Absolute: 4.6 10*3/uL (ref 1.4–7.0)
PLATELETS: 233 10*3/uL (ref 150–379)
RBC: 3.93 x10E6/uL (ref 3.77–5.28)
RDW: 15 % (ref 12.3–15.4)
WBC: 9.2 10*3/uL (ref 3.4–10.8)

## 2016-08-10 NOTE — Telephone Encounter (Signed)
fyi

## 2016-08-10 NOTE — Telephone Encounter (Signed)
Pt calling to let michael know she is felling much better today

## 2016-08-13 NOTE — Telephone Encounter (Signed)
Glad to hear. 

## 2016-08-16 ENCOUNTER — Ambulatory Visit (INDEPENDENT_AMBULATORY_CARE_PROVIDER_SITE_OTHER): Payer: Medicare Other | Admitting: Physician Assistant

## 2016-08-16 VITALS — BP 139/69 | HR 80 | Temp 98.6°F | Resp 16 | Ht 66.0 in | Wt 229.4 lb

## 2016-08-16 DIAGNOSIS — R062 Wheezing: Secondary | ICD-10-CM

## 2016-08-16 DIAGNOSIS — R05 Cough: Secondary | ICD-10-CM

## 2016-08-16 DIAGNOSIS — R059 Cough, unspecified: Secondary | ICD-10-CM

## 2016-08-16 NOTE — Progress Notes (Signed)
08/16/2016 3:30 PM   DOB: [birth year] / MRN: [id number]  SUBJECTIVE:  [name] is a 76 y.o. female presenting for recheck.  Tells me she is feeling better however she is still coughing.  She tells me she is no longer wheezing.  Cough is now production of green sputum.  Tells me the wheezing is better.  She has been taking doxy bid and completed 3 days of pred 50 mg daily.   She is allergic to other; sulfamethoxazole-trimethoprim; cortisone; latex; and tape.   She  has a past medical history of Anxiety; Coronary artery disease; Environmental allergies; Essential iris atrophy; Fatigue; Fibromyalgia; GERD (gastroesophageal reflux disease); Glaucoma; Hyperlipidemia; Hypertension; Menopause; Obesity (BMI 30-39.9); OSA (obstructive sleep apnea); Osteoarthritis; and Oxygen dependent.    She  reports that she quit smoking about 55 years ago. Her smoking use included Cigarettes. She has never used smokeless tobacco. She reports that she does not drink alcohol or use drugs. She  has no sexual activity history on file. 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  Her family history includes Cancer in her father; Coronary artery disease in her brother, brother, brother, brother, and brother; Heart attack in her brother and mother.  Review of Systems  Constitutional: Negative for diaphoresis.  Respiratory: Positive for cough and sputum production. Negative for hemoptysis, shortness of breath and wheezing.   Cardiovascular: Negative for chest pain, orthopnea  and leg swelling.  Gastrointestinal: Negative for nausea.  Neurological: Negative for dizziness.    The problem list and medications were reviewed and updated by myself where necessary and exist elsewhere in the encounter.   OBJECTIVE:  BP 139/69 (BP Location: Right Arm, Patient Position: Sitting, Cuff Size: Small)   Pulse 80   Temp 98.6 F (37 C) (Oral)   Resp 16   Ht 5\' 6"  (1.676 m)   Wt 229 lb 6.4 oz (104.1 kg)   SpO2 97%   BMI 37.03 kg/m   Physical Exam  Constitutional: She is oriented to person, place, and time.  HENT:  Right Ear: Hearing, tympanic membrane, external ear and ear canal normal.  Left Ear: Hearing, tympanic membrane, external ear and ear canal normal.  Nose: Nose normal. Right sinus exhibits no maxillary sinus tenderness and no frontal sinus tenderness. Left sinus exhibits no maxillary sinus tenderness and no frontal sinus tenderness.  Mouth/Throat: Uvula is midline, oropharynx is clear and moist and mucous membranes are normal. Mucous membranes are not dry. No oropharyngeal exudate, posterior oropharyngeal edema or tonsillar abscesses.  Cardiovascular: Normal rate, regular rhythm, S1 normal and S2 normal.  Exam reveals no gallop, no friction rub and no decreased pulses.   No murmur heard. Pulmonary/Chest: Effort normal. No stridor. No respiratory distress. She has no wheezes. She has no rales.  Abdominal: She exhibits no distension.  Musculoskeletal: She exhibits no edema.  Lymphadenopathy:       Head (right side): No submandibular and no tonsillar adenopathy present.       Head (left side): No submandibular and no tonsillar adenopathy present.    She has no cervical adenopathy.  Neurological: She is alert and oriented to person, place, and time. She displays normal reflexes. No cranial nerve deficit. She exhibits  normal muscle tone. Coordination normal.    No results found for this or any previous visit (from the past 72 hour(s)).  No results  found.  ASSESSMENT AND PLAN:  [name] was seen today for follow-up.  Diagnoses and all orders for this visit:  Cough: Improving. Vitals look great.  Exam normal.  She will continue doxy to cover for atypical.  She has had three days of 50 mg pred.  Will hold off on more for now given her improvement.   Wheezing: Resolved.     The patient is advised to call or return to clinic if she does not see an improvement in symptoms, or to seek the care of the closest emergency department if she worsens with the above plan.   Philis Fendt, MHS, PA-C [facility] 08/16/2016 3:30 PM

## 2016-08-16 NOTE — Progress Notes (Signed)
  08/16/2016 2:55 PM   DOB: June 13, 1940 / MRN: [id number]  SUBJECTIVE:  [name] is a 76 y.o. female presenting for recheck of cough.  She continues to cough however she is starting to feel better.  Denies wheezing at this time.  Tells me the cough is now productive.  She completed 3 days of 50 mg pred and is maintained on doxy.   She is allergic to other; sulfamethoxazole-trimethoprim; cortisone; latex; and tape.   She  has a past medical history of Anxiety; Coronary artery disease; Environmental allergies; Essential iris atrophy; Fatigue; Fibromyalgia; GERD (gastroesophageal reflux disease); Glaucoma; Hyperlipidemia; Hypertension; Menopause; Obesity (BMI 30-39.9); OSA (obstructive sleep apnea); Osteoarthritis; and Oxygen dependent.    She  reports that she quit smoking about 55 years ago. Her smoking use included Cigarettes. She has never used smokeless tobacco. She reports that she does not drink alcohol or use drugs. She  has no sexual activity history on file. 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  Her family history includes Cancer in her father; Coronary artery disease in her brother, brother, brother, brother, and brother; Heart attack in her brother and mother.  Review of Systems  Constitutional: Negative for chills, diaphoresis and fever.  Respiratory: Negative for cough, hemoptysis, sputum production, shortness of breath and wheezing.   Cardiovascular: Negative for chest pain, orthopnea and leg swelling.  Gastrointestinal:  Negative for nausea.  Neurological: Negative for dizziness.    The problem list and medications were reviewed and updated by myself where necessary and exist elsewhere in the encounter.   OBJECTIVE:  BP 139/69 (BP Location: Right Arm, Patient Position: Sitting, Cuff Size: Small)   Pulse 80   Temp 98.6 F (37 C) (Oral)   Resp 16   Ht 5\' 6"  (1.676 m)   Wt 229 lb 6.4 oz (104.1 kg)   SpO2 97%   BMI 37.03 kg/m     Physical Exam  Constitutional:  Non-toxic appearance.  Cardiovascular: Normal rate, regular rhythm, S1 normal and S2 normal.  Exam reveals no gallop, no friction rub and no decreased pulses.   No murmur heard. Pulmonary/Chest: Effort normal. No stridor. No respiratory distress. She has no wheezes. She has no rales.  Abdominal: She exhibits no distension.  Musculoskeletal: She exhibits no edema.  Skin: Skin is warm and dry. She is not diaphoretic. No pallor.    No results found for this or any previous visit (from the past 72 hour(s)).  No results found.  ASSESSMENT AND PLAN:  [name] was seen today for follow-up.  Diagnoses and all orders for this visit:  Cough  Wheezing    The patient is advised to call or return to clinic if she does not see an improvement in symptoms, or to seek the care of the closest emergency department if she worsens with the above plan.   Philis Fendt, MHS, PA-C [facility] 08/16/2016 2:55 PM

## 2016-08-16 NOTE — Patient Instructions (Signed)
     IF you received an x-ray today, you will receive an invoice from [facility]. Please contact [facility] at [phone] with questions or concerns regarding your invoice.   IF you received labwork today, you will receive an invoice from [facility]. Please contact [facility] at [phone] with questions or concerns regarding your invoice.   Our billing staff will not be able to assist you with questions regarding bills from these companies.  You will be contacted with the lab results as soon as they are available. The fastest way to get your results is to activate your My Chart account. Instructions are located on the last page of this paperwork. If you have not heard from us regarding the results in 2 weeks, please contact this office.     

## 2016-08-24 ENCOUNTER — Telehealth: Payer: Self-pay

## 2016-08-24 NOTE — Telephone Encounter (Signed)
Discussed pt's symptoms with Dr. Donzetta Matters.  Advised to schedule appt. / no vascular lab needed with appt.  Will have Scheduler contact pt.

## 2016-08-24 NOTE — Telephone Encounter (Signed)
Scheduled 4/20 @ 9:15am- confirmed with patient.

## 2016-08-24 NOTE — Telephone Encounter (Signed)
Phone call from pt.  Reported she has intermittent shooting pains in the right wrist area.  Reported the pain has been persistent since the surgical repair.  Stated that yesterday the pain was excruciating.  Denied any recent injury or physical strain to the right wrist.  Stated "it might be a little puffy."  Denied fever/ chills.  Questioned about strength / ability to use the right hand/ wrist;  stated she has difficulty with wringing out washcloth or opening bottle cap.   Advised will call pt. back with appt. Information.  Agreed.

## 2016-08-27 ENCOUNTER — Encounter: Payer: Self-pay | Admitting: Vascular Surgery

## 2016-09-07 ENCOUNTER — Encounter: Payer: Self-pay | Admitting: Vascular Surgery

## 2016-09-07 ENCOUNTER — Ambulatory Visit (INDEPENDENT_AMBULATORY_CARE_PROVIDER_SITE_OTHER): Payer: Medicare Other | Admitting: Vascular Surgery

## 2016-09-07 VITALS — BP 136/60 | HR 58 | Temp 97.1°F | Resp 16 | Ht 66.0 in | Wt 236.0 lb

## 2016-09-07 DIAGNOSIS — I729 Aneurysm of unspecified site: Secondary | ICD-10-CM

## 2016-09-07 NOTE — Progress Notes (Signed)
Patient ID: [name], female   DOB: [birth year], 76 y.o.   MRN: [id number]  Reason for Consult: Re-evaluation (aneurysm of right radial artery)   Referred by Alroy Dust, L.Marlou Sa, MD  Subjective:     HPI:  [name] is a 76 y.o. female here for evaluation of right wrist and hand pain. She previously underwent suture repair of the radial artery pseudoaneurysm and evacuation of a hematoma following catheterization while in the hospital approximately one year ago. Since that time she has ongoing pain in her wrist that is constant at mild-to-moderate but she does have occasional severe shooting pains. She is on tramadol for fibromyalgia and has not seen any relief from this. Pain is exacerbated by using the hand for housework specifically with opening jars. She notes weakness into the thumb as well and thinks that maybe this is getting worse with time. She does not have any constitutional symptoms. Her incision is healed well. She is otherwise doing well.  Past Medical History:  Diagnosis Date  . Anxiety   . Coronary artery disease    a. s/p MI 2009 - PCI/DES distal  RCA w/ 2.75 x 18 Xience DES;  b. 05/2010 Myoview: Apical thinning, EF 73%;  c. 06/2010 Cath: moderate nonobs dzs, EF 65%;  d.  Lexiscan Myoview (07/2013):  No scar or ischemia, EF 67%; Normal Study  . Environmental allergies   . Essential iris atrophy    Right side  . Fatigue   . Fibromyalgia   . GERD (gastroesophageal reflux disease)   . Glaucoma   . Hyperlipidemia   . Hypertension   . Menopause   . Obesity (BMI 30-39.9)   . OSA (obstructive sleep apnea)    does not wear CPAP  . Osteoarthritis   . Oxygen dependent    3L   Family History  Problem Relation Age of Onset  . Heart attack Mother   . Cancer Father   . Coronary artery disease Brother     had CABG  . Coronary artery disease Brother   . Coronary artery disease Brother   . Coronary artery disease Brother   . Coronary artery disease Brother   . Heart  attack Brother    Past Surgical History:  Procedure Laterality Date  . CARDIAC CATHETERIZATION  06/29/2010   Mild to moderate coronary artery irregularities.  Her  proximal left anterior descending artery is moderately narrowed.  She  also has an eccentric stenosis in the proximal LAD.  These do not appear  to obstruct flow at present, but they are fairly small vessels.  They  appeared to be between 1.5 and 2 mm in diamete  . CARDIAC CATHETERIZATION  08/02/2009   Patent stent  . CARDIAC CATHETERIZATION  05/12/2008   Stent to the distal RCA  . CARDIAC CATHETERIZATION N/A 09/20/2015   Procedure: Left Heart Cath and Coronary Angiography;  Surgeon: Jettie Booze, MD;  Location: [facility];  Service: Cardiovascular;  Laterality: N/A;  . CARDIAC CATHETERIZATION  09/20/2015   Procedure: Intravascular Ultrasound/IVUS;  Surgeon: Jettie Booze, MD;  Location: [facility];  Service: Cardiovascular;;  . CARDIAC CATHETERIZATION  09/20/2015   Procedure: Intravascular Pressure Wire/FFR Study;  Surgeon: Jettie Booze, MD;  Location: [facility];  Service: Cardiovascular;;  . CLIPPING OF ATRIAL APPENDAGE N/A 09/23/2015   Procedure:  CLIPPING OF ATRIAL APPENDAGE;  Surgeon: Grace Isaac, MD;  Location: [facility];  Service: Open Heart Surgery;  Laterality: N/A;  .  CORONARY ARTERY BYPASS GRAFT N/A 09/23/2015   Procedure: CORONARY ARTERY BYPASS GRAFTING (CABG) TIMES 1 USING LEFT INTERNAL MAMMARY TO THE LAD;  Surgeon: Grace Isaac, MD;  Location: [facility];  Service: Open Heart Surgery;  Laterality: N/A;  . MAZE N/A 09/23/2015   Procedure: MAZE;  Surgeon: Grace Isaac, MD;  Location: [facility];  Service: Open Heart Surgery;  Laterality: N/A;  . RESECTION OF ARTERIOVENOUS FISTULA ANEURYSM Right 10/21/2015   Procedure: SUTURE REPAIR OF RADIAL ARTERY AND EVACUATION OF HEMATOMA;  Surgeon: Mal Misty, MD;  Location: [facility];  Service: Vascular;  Laterality: Right;  . TEE WITHOUT CARDIOVERSION  N/A 09/23/2015   Procedure: TRANSESOPHAGEAL ECHOCARDIOGRAM (TEE);  Surgeon: Grace Isaac, MD;  Location: [facility];  Service: Open Heart Surgery;  Laterality: N/A;    Short Social History:  Social History  Substance Use Topics  . Smoking status: Former Smoker    Types: Cigarettes    Quit date: 09/13/1960  . Smokeless tobacco: Never Used  . Alcohol use No    Allergies  Allergen Reactions  . Other Nausea Only    NO MEAT!!  . Sulfamethoxazole-Trimethoprim Nausea Only  . Cortisone Nausea Only and Other (See Comments)    Pt gets very hot and flushed  . Latex Rash    Redness and irritation  . Tape Rash    Redness and irritation    Current Outpatient Prescriptions  Medication Sig Dispense Refill  . apixaban (ELIQUIS) 5 MG TABS tablet TAKE 1 TABLET(5 MG) BY MOUTH TWICE DAILY 180 tablet 3  . aspirin EC 81 MG EC tablet Take 1 tablet (81 mg total) by mouth daily.    . Cyanocobalamin (VITAMIN B-12 PO) Take 1 tablet by mouth daily.    . ferrous sulfate (SM IRON) 325 (65 FE) MG tablet 325 mg.    . ferrous sulfate 325 (65 FE) MG tablet Take 325 mg by mouth every morning.     . furosemide (LASIX) 40 MG tablet Take 1 tablet (40 mg total) by mouth daily. 90 tablet 3  . lisinopril (PRINIVIL,ZESTRIL) 20 MG tablet Take 1 tablet (20 mg total) by mouth daily. 90 tablet 3  . metoprolol tartrate (LOPRESSOR) 25 MG tablet Take 1 tablet (25 mg total) by mouth 2 (two) times daily. 90 tablet 3  . nitrofurantoin (MACRODANTIN) 100 MG capsule Take 100 mg by mouth daily.     . pantoprazole (PROTONIX) 40 MG tablet Take 40 mg by mouth daily.     . polyethylene glycol (MIRALAX / GLYCOLAX) packet Take 17 g by mouth daily. 14 each 0  . potassium chloride (K-DUR) 10 MEQ tablet Take 1 tablet (10 mEq total) by mouth daily. 90 tablet 3  . pramipexole (MIRAPEX) 1 MG tablet Take 1-3 mg by mouth at bedtime.     . traMADol (ULTRAM) 50 MG tablet Take one tablet by mouth every 4 hours as needed for moderate pain; Take two  tablets by mouth every 4 hours as needed for severe pain 360 tablet 0  . venlafaxine (EFFEXOR) 37.5 MG tablet Take 37.5 mg by mouth 2 (two) times daily.    Marland Kitchen acetaminophen (TYLENOL) 500 MG tablet Take 1,000 mg by mouth every 8 (eight) hours as needed for moderate pain.    Marland Kitchen aspirin 325 MG tablet Take 325 mg by mouth.    . nitroGLYCERIN (NITROSTAT) 0.4 MG SL tablet Place 1 tablet (0.4 mg total) under the tongue every 5 (five) minutes as needed for chest pain. 25  tablet 6  . predniSONE (DELTASONE) 50 MG tablet Take one daily for the next three days. (Patient not taking: Reported on 09/07/2016) 3 tablet 0   No current facility-administered medications for this visit.     Review of Systems  Constitutional:  Constitutional negative. HENT: HENT negative.  Eyes: Eyes negative.  Respiratory: Respiratory negative.  Cardiovascular: Positive for leg swelling.  GI: Gastrointestinal negative.  Musculoskeletal:       Right wrist/hand pain Skin: Skin negative.  Neurological: Neurological negative. Hematologic: Hematologic/lymphatic negative.  Psychiatric: Psychiatric negative.        Objective:  Objective   Vitals:   09/07/16 0920  BP: 136/60  Pulse: (!) 58  Resp: 16  Temp: 97.1 F (36.2 C)  SpO2: 97%  Weight: 236 lb (107 kg)  Height: 5\' 6"  (1.676 m)   Body mass index is 38.09 kg/m.  Physical Exam  Constitutional: She is oriented to person, place, and time. She appears well-developed.  HENT:  Head: Normocephalic.  Eyes: Pupils are equal, round, and reactive to light.  Neck: Normal range of motion.  Cardiovascular: Normal rate.   Palpable radial and ulnar pulses on right Multiphasic signal at right palmar arch  Pulmonary/Chest: Effort normal.  Abdominal: Soft.  Musculoskeletal:  Right hand is warm and well perfused  Neurological: She is alert and oriented to person, place, and time.  Skin: Skin is warm and dry.  Psychiatric: She has a normal mood and affect. Her behavior is  normal. Judgment and thought content normal.    Data: No studies with today's visit     Assessment/Plan:     76 year old female underwent repair of a right radial pseudoaneurysm in June of last year now with persistent mild to moderate pain with occasional severe pain in her right wrist and thumb. Her vascular system appears to be intact in her hand. I discussed with her the etiology of this being surgery versus pseudoaneurysm itself versus scarring. Given that she is vascular intact I have nothing to offer her from a surgical standpoint but will refer her to neurology for evaluation of her nerve to see if they have any medication or procedures that could be offered. She is planning a move to [place] in the near future but would like to see a neurologist here prior to the move. We will get her referred and she can follow-up prn.     Waynetta Sandy MD [facility]

## 2016-09-14 ENCOUNTER — Telehealth: Payer: Self-pay | Admitting: Vascular Surgery

## 2016-09-14 NOTE — Telephone Encounter (Signed)
-----   Message from Reginia Naas sent at 09/13/2016  3:38 PM EDT ----- Regarding: Neuro appt This pt called wanting to know if her Neuro appt had been set up yet, I did not see anything in the computer other than the referral you entered. Please advise

## 2016-09-19 ENCOUNTER — Ambulatory Visit (INDEPENDENT_AMBULATORY_CARE_PROVIDER_SITE_OTHER): Payer: Medicare Other | Admitting: Cardiovascular Disease

## 2016-09-19 ENCOUNTER — Encounter: Payer: Self-pay | Admitting: Cardiovascular Disease

## 2016-09-19 VITALS — BP 128/60 | HR 69 | Ht 66.0 in | Wt 222.1 lb

## 2016-09-19 DIAGNOSIS — M7989 Other specified soft tissue disorders: Secondary | ICD-10-CM

## 2016-09-19 DIAGNOSIS — R5383 Other fatigue: Secondary | ICD-10-CM

## 2016-09-19 DIAGNOSIS — I2510 Atherosclerotic heart disease of native coronary artery without angina pectoris: Secondary | ICD-10-CM

## 2016-09-19 NOTE — Progress Notes (Signed)
Patient Name: [name] Date of Encounter: 09/19/2016  Primary Care Provider:  Donnie Coffin, MD Primary Cardiologist:  Joaquim Nam, MD  Problem List  1. CAD - s/p CABG Sep 22, 2015.  2. Hyperlipidemia 3. Obstructive sleep apnea 4. Hypertension 5. Atrial fib    August 29, 8119:  76 year old female with the above problem list.  She was in usual state of health about 3 weeks ago when she began to note mild lower extremity edema with bilateral ankle and calf pain with ambulation.  She takes her left leg has been more swollen than the right.  She has been relatively active recently partaking and gardening without any chest pain or dyspnea.  Her weight is actually down from baseline.  She's seen no change in her salt intake or amt of time that her legs might be in a dependent position.  She's quite flustered and anxious today.  She misplaced her phone and her dtr initially dropped her off @ the old [facility] office and she had to walk down the street to here.  She doesn't know how to reach her dtr to have her come down and pick her up.  She's tearful and emotionally upset.  In that setting, after the visit, pt said that she thinks she's having chest pain.  An ecg was done, and before it was even completed, c/p resolved, as did her emotional upset.  Oct 08, 2012:  [name] is doing well. She's not had any episodes of chest pain or shortness of breath. She was seen by our PA last year. She has a history of coronary artery disease but has not had any recurrent angina. She's been working out in her garden without difficulties.  She has occasional episodes of orthostatic hypotension and also notes some occasional leg edema. I suspect this is from the amlodipine.  October 20, 2013:  [name] has seen [name] and was referred to [name] for management of her sleep apnea.   Her BP has been well controlled.  She needs to have a special dental appliance made for her OSA- did not tolerate the  CPAP. She has chronic dyspnea. No CP   She has found to have some thyroid issues.  Sees Dr. Alroy Dust for this.     December 29, 2014:  [name] is seen back today for evaulation of her CAD and HTN  And a recent episode of atrial fib. She was admitted and converted back to normal  ( ? No CP , BP has been well controlled    July 29, 2015:  Has chronic fatigue  Has had thyroid issues Doing well from a cardiac standpoint   Oct. 5, 2017:  [name] presented to the hospital with CP Sep 19, 2015. Cath showed prox LAD disease that was not amenable to PCI [name] had CABG  And MAZE procedure Sep 22, 2015. Complicated by a right radial hematoma - was eventually fixed on October 21, 2015   Nov. 15, 2017  [name] is doing well. She had coronary artery bypass grafting in May 2017. She has a history of atrial fibrillation and also had a MAZE  procedure with the coronary artery bypass grafting. We stopped her amiodarone at her last visit.  Feb. 26, 2018:  Doing ok Has had some leg swelling and BP is elevated.  Uses some salt .  No CP . Has had some DOE . Echo in Dec. 2017 shows normal LV systolic function with grade 2 Diastolic dysfunction  Sep 19, 2016: [name] is here to follow up with her CAD - she status post coronary artery bypass grafting ( LIMA to LAD + MAZE procedure )  in 2017. She had a Maze procedure at that time.  Is having some left leg edema .  trjied 3 days of double dose lasix - did not seem to help  Echo in Dec. Showed normal LV function with grade 2 DD.     Past Medical History:  Diagnosis Date  . Anxiety   . Coronary artery disease    a. s/p MI 2009 - PCI/DES distal  RCA w/ 2.75 x 18 Xience DES;  b. 05/2010 Myoview: Apical thinning, EF 73%;  c. 06/2010 Cath: moderate nonobs dzs, EF 65%;  d.  Lexiscan Myoview (07/2013):  No scar or ischemia, EF 67%; Normal Study  . Environmental allergies   . Essential iris atrophy    Right side  . Fatigue   . Fibromyalgia   . GERD (gastroesophageal  reflux disease)   . Glaucoma   . Hyperlipidemia   . Hypertension   . Menopause   . Obesity (BMI 30-39.9)   . OSA (obstructive sleep apnea)    does not wear CPAP  . Osteoarthritis   . Oxygen dependent    3L   Past Surgical History:  Procedure Laterality Date  . CARDIAC CATHETERIZATION  06/29/2010   Mild to moderate coronary artery irregularities.  Her  proximal left anterior descending artery is moderately narrowed.  She  also has an eccentric stenosis in the proximal LAD.  These do not appear  to obstruct flow at present, but they are fairly small vessels.  They  appeared to be between 1.5 and 2 mm in diamete  . CARDIAC CATHETERIZATION  08/02/2009   Patent stent  . CARDIAC CATHETERIZATION  05/12/2008   Stent to the distal RCA  . CARDIAC CATHETERIZATION N/A 09/20/2015   Procedure: Left Heart Cath and Coronary Angiography;  Surgeon: Jettie Booze, MD;  Location: [facility];  Service: Cardiovascular;  Laterality: N/A;  . CARDIAC CATHETERIZATION  09/20/2015   Procedure: Intravascular Ultrasound/IVUS;  Surgeon: Jettie Booze, MD;  Location: [facility];  Service: Cardiovascular;;  . CARDIAC CATHETERIZATION  09/20/2015   Procedure: Intravascular Pressure Wire/FFR Study;  Surgeon: Jettie Booze, MD;  Location: [facility];  Service: Cardiovascular;;  . CLIPPING OF ATRIAL APPENDAGE N/A 09/23/2015   Procedure:  CLIPPING OF ATRIAL APPENDAGE;  Surgeon: Grace Isaac, MD;  Location: [facility];  Service: Open Heart Surgery;  Laterality: N/A;  . CORONARY ARTERY BYPASS GRAFT N/A 09/23/2015   Procedure: CORONARY ARTERY BYPASS GRAFTING (CABG) TIMES 1 USING LEFT INTERNAL MAMMARY TO THE LAD;  Surgeon: Grace Isaac, MD;  Location: [facility];  Service: Open Heart Surgery;  Laterality: N/A;  . MAZE N/A 09/23/2015   Procedure: MAZE;  Surgeon: Grace Isaac, MD;  Location: [facility];  Service: Open Heart Surgery;  Laterality: N/A;  . RESECTION OF ARTERIOVENOUS FISTULA ANEURYSM Right  10/21/2015   Procedure: SUTURE REPAIR OF RADIAL ARTERY AND EVACUATION OF HEMATOMA;  Surgeon: Mal Misty, MD;  Location: [facility];  Service: Vascular;  Laterality: Right;  . TEE WITHOUT CARDIOVERSION N/A 09/23/2015   Procedure: TRANSESOPHAGEAL ECHOCARDIOGRAM (TEE);  Surgeon: Grace Isaac, MD;  Location: [facility];  Service: Open Heart Surgery;  Laterality: N/A;    Allergies  Allergies  Allergen Reactions  . Other Nausea Only    NO MEAT!!  .  Sulfamethoxazole-Trimethoprim Nausea Only  . Cortisone Nausea Only and Other (See Comments)    Pt gets very hot and flushed  . Latex Rash    Redness and irritation  . Tape Rash    Redness and irritation      Home Medications  Prior to Admission medications   Medication Sig Start Date End Date Taking? Authorizing Provider  acetaminophen (TYLENOL) 500 MG tablet Take 500 mg by mouth as needed.    Yes Historical Provider, MD  amitriptyline (ELAVIL) 10 MG tablet Take 10 mg by mouth at bedtime.   Yes Historical Provider, MD  amLODipine (NORVASC) 5 MG tablet Take 1 tablet (5 mg total) by mouth daily. 09/03/11 09/02/12 Yes Thayer Headings, MD  aspirin 325 MG tablet Take 325 mg by mouth daily.     Yes Historical Provider, MD  carvedilol (COREG) 12.5 MG tablet Take 1 tablet (12.5 mg total) by mouth 2 (two) times daily. 09/03/11 09/02/12 Yes Thayer Headings, MD  clopidogrel (PLAVIX) 75 MG tablet TAKE ONE TABLET BY MOUTH DAILY 07/06/11  Yes Burtis Junes, NP  diclofenac sodium (VOLTAREN) 1 % GEL Apply topically. **CREAM** use as directed    Yes Historical Provider, MD  fluticasone (FLONASE) 50 MCG/ACT nasal spray Place 1 spray into the nose daily.   Yes Historical Provider, MD  hydrochlorothiazide 25 MG tablet Take 25 mg by mouth daily.    Yes Historical Provider, MD  HYDROcodone-homatropine (HYCODAN) 5-1.5 MG/5ML syrup Take 5 mLs by mouth every 8 (eight) hours as needed for cough. 08/29/11 09/08/11 Yes Robyn Haber, MD  IRON PO Take 1 tablet by mouth daily.      Yes Historical Provider, MD  levofloxacin (LEVAQUIN) 500 MG tablet Take 1 tablet (500 mg total) by mouth daily. 08/29/11 09/08/11 Yes Robyn Haber, MD  methimazole (TAPAZOLE) 5 MG tablet Take 5 mg by mouth daily.   Yes Historical Provider, MD  nitroGLYCERIN (NITROSTAT) 0.4 MG SL tablet Place 0.4 mg under the tongue every 5 (five) minutes as needed.     Yes Historical Provider, MD  pantoprazole (PROTONIX) 40 MG tablet Take 40 mg by mouth 2 (two) times daily.    Yes Historical Provider, MD  potassium chloride (KLOR-CON) 10 MEQ CR tablet Take 1 tablet (10 mEq total) by mouth daily. 02/23/11  Yes Thayer Headings, MD  pramipexole (MIRAPEX) 0.25 MG tablet Take 1 mg by mouth at bedtime.    Yes Historical Provider, MD  rosuvastatin (CRESTOR) 10 MG tablet Take 1 tablet (10 mg total) by mouth daily. 06/06/11  Yes Thayer Headings, MD  traMADol (ULTRAM) 50 MG tablet Take 50 mg by mouth 3 (three) times daily as needed.    Yes Historical Provider, MD  venlafaxine (EFFEXOR) 75 MG tablet Take 75 mg by mouth daily. 1/2 tablet 2 (two) times daily   Yes Historical Provider, MD  lisinopril (PRINIVIL,ZESTRIL) 40 MG tablet Take 1 tablet (40 mg total) by mouth daily. 09/07/11 09/06/12  Thayer Headings, MD     Physical Exam  Blood pressure 128/60, pulse 69, height 5\' 6"  (1.676 m), weight 222 lb 1.9 oz (100.8 kg), SpO2 98 %.  General: Pleasant, NAD Psych: Normal affect. Neuro: Alert and oriented X 3. Moves all extremities spontaneously. HEENT: Normal  Neck: Supple without bruits or JVD. Lungs:  Resp regular and unlabored, few faint rales in bases.  Heart: RRR no s3, s4. 1/6 sem left sternal border  Abdomen: Soft, non-tender, non-distended, BS + x 4.  Extremities: No  clubbing, cyanosis , left leg 1-2+  edema.  Marland Kitchen DP/PT/Radials 2+ and equal bilaterally.  ECG   Assessment and Plan   1. Chronic diastolic congestive heart failure. She seems to be doing ok  Still has general lack of energy - lots of fatigue  Will  check CBC, BMT, TSH for further eval of this fatigue     2. CAD - she's not having any episodes of chest discomfort.    3. Hyperlipidemia - continue current medications.    4. Obstructive sleep apnea-  Needs to get back to see her doctor who has been managing this   5. Hypertension -BP is a bit low and we need to start metoprolol. She will monitor her BP and keep a record. We can increase her meds at her next visit if needed    5. Atrial fib - Continue Eliquis.   She had a MAZE procedure with her surgery . She has maintained NSR  I think we should continue the Eliquis for now.     6. Left leg edema :   Will get a venous duplex scan .   She is moving to [place].  We will provide records for her       Mertie Moores, MD  09/19/2016 4:55 PM    [address],  [place], [place]  [address] Pager [phone] Phone: [phone]; Fax: [phone]

## 2016-09-19 NOTE — Patient Instructions (Signed)
Medication Instructions:  Your physician recommends that you continue on your current medications as directed. Please refer to the Current Medication list given to you today.   Labwork: Your physician recommends that you return for lab work on same day as your venous duplex at our [facility] office:  BMET, TSH, CBC   Testing/Procedures: Your physician has requested that you have a lower or upper extremity venous duplex. This test is an ultrasound of the veins in the legs or arms. It looks at venous blood flow that carries blood from the heart to the legs or arms. Allow one hour for a Lower Venous exam. Allow thirty minutes for an Upper Venous exam. There are no restrictions or special instructions.   Follow-Up: Your physician recommends that you schedule a follow-up appointment in: as needed with Dr. Acie Fredrickson   If you need a refill on your cardiac medications before your next appointment, please call your pharmacy.   Thank you for choosing [facility]! Christen Bame, RN [phone]

## 2016-09-24 ENCOUNTER — Ambulatory Visit: Payer: Medicare Other | Admitting: Emergency Medicine

## 2016-09-24 ENCOUNTER — Encounter (HOSPITAL_COMMUNITY): Payer: Self-pay

## 2016-09-24 ENCOUNTER — Emergency Department (HOSPITAL_COMMUNITY)
Admission: EM | Admit: 2016-09-24 | Discharge: 2016-09-24 | Disposition: A | Discharge status: Home / Self Care | Payer: Medicare Other | Attending: Dermatology | Admitting: Dermatology

## 2016-09-24 DIAGNOSIS — Z79899 Other long term (current) drug therapy: Secondary | ICD-10-CM | POA: Diagnosis not present

## 2016-09-24 DIAGNOSIS — M25561 Pain in right knee: Secondary | ICD-10-CM | POA: Diagnosis present

## 2016-09-24 DIAGNOSIS — Z5321 Procedure and treatment not carried out due to patient leaving prior to being seen by health care provider: Secondary | ICD-10-CM | POA: Diagnosis not present

## 2016-09-24 DIAGNOSIS — Z7982 Long term (current) use of aspirin: Secondary | ICD-10-CM | POA: Insufficient documentation

## 2016-09-24 DIAGNOSIS — I2510 Atherosclerotic heart disease of native coronary artery without angina pectoris: Secondary | ICD-10-CM | POA: Insufficient documentation

## 2016-09-24 NOTE — ED Triage Notes (Addendum)
Pt reports right knee pain secondary to getting in and out of a smaller new car, onset 2 days ago. Pt also verbalizing concerns about bilateral foot swelling and back pain.

## 2016-09-24 NOTE — ED Notes (Signed)
Pt came up to desk and stated "I'm just going to go home and see my pcp tomorrow" Pt encouraged to stay but elected to leave. Pt ambulatory.

## 2016-09-25 ENCOUNTER — Ambulatory Visit (INDEPENDENT_AMBULATORY_CARE_PROVIDER_SITE_OTHER): Payer: Medicare Other

## 2016-09-25 ENCOUNTER — Ambulatory Visit: Payer: Medicare Other | Admitting: Physician Assistant

## 2016-09-25 ENCOUNTER — Encounter: Payer: Self-pay | Admitting: Student

## 2016-09-25 ENCOUNTER — Ambulatory Visit (INDEPENDENT_AMBULATORY_CARE_PROVIDER_SITE_OTHER): Payer: Medicare Other | Admitting: Student

## 2016-09-25 ENCOUNTER — Ambulatory Visit: Payer: Medicare Other | Admitting: Student

## 2016-09-25 VITALS — BP 131/73 | HR 76 | Temp 98.1°F | Resp 16 | Ht 66.0 in | Wt 221.0 lb

## 2016-09-25 DIAGNOSIS — M25561 Pain in right knee: Secondary | ICD-10-CM

## 2016-09-25 MED ORDER — METHYLPREDNISOLONE ACETATE 80 MG/ML IJ SUSP
80.0000 mg | Freq: Once | INTRAMUSCULAR | Status: AC
  Administered 2016-09-25: 80 mg via INTRA_ARTICULAR

## 2016-09-25 MED ORDER — METHYLPREDNISOLONE ACETATE 40 MG/ML IJ SUSP: 40.0000 mg | Freq: Once | INTRAMUSCULAR | Status: DC

## 2016-09-25 NOTE — Progress Notes (Signed)
  [name] - 76 y.o. female MRN [id number]  Date of birth: May 01, 1941  SUBJECTIVE:  Including CC & ROS.  CC: right knee pain  Complains of right knee pain that has been ongoing for 4 days.  She twisted her knee Friday and notes pain and swelling.  Has had minor knee problems in the past.  She has patellofemoral arthritis diagnosed on MRI of her left knee in 2015.  She has no issues with this knee at this time.  She denies catching or locking of right knee.  Mild swelling.  Difficulty ambulating.  She has tolerated injections previously.  Cannot take NSAIDs due to blood thinners.  ROS: No unexpected weight loss, fever, chills,  instability, muscle pain, numbness/tingling, redness, otherwise see HPI   PMHx - Updated and reviewed.  Contributory factors include: Negative PSHx - Updated and reviewed.  Contributory factors include:  Negative FHx - Updated and reviewed.  Contributory factors include:  Negative Social Hx - Updated and reviewed. Contributory factors include: Negative Medications - reviewed   DATA REVIEWED: Left knee MRI in 2015 which showed PF OA  PHYSICAL EXAM:  VS: BP:131/73  HR:76bpm  TEMP:98.1 F (36.7 C)(Oral)  RESP:96 %  HT:5\' 6"  (167.6 cm)   WT:221 lb (100.2 kg)  BMI:35.7 PHYSICAL EXAM: Gen: NAD, alert, cooperative with exam, well-appearing HEENT: clear conjunctiva,  CV:  no edema, capillary refill brisk, normal rate Resp: non-labored Skin: no rashes, normal turgor  Neuro: no gross deficits.  Psych:  alert and oriented Knee: Inspection with no erythema or obvious bony abnormalities. +trace effusion Palpation normal with no warmth, patellar tenderness, or condyle tenderness. +medial joint line tenderness ROM full in flexion and extension and lower leg rotation. Ligaments with solid consistent endpoints including ACL, PCL, LCL, MCL. Negative Mcmurray's, Apley's, and Thessalonian tests. Non painful patellar compression. Patellar glide with crepitus. Patellar  and quadriceps tendons unremarkable. Hamstring and quadriceps strength is normal.     ASSESSMENT & PLAN:   Acute pain of right knee Reviewed 4 view knee x-rays which show moderate patellofemoral arthritis.  None seen medial or laterally, but not standing views.  Injection performed, see note.  Pt tolerated well.  Follow up if not improved in 3 weeks.  Follow up care instructions given.  Procedure:  Injection of right knee Consent obtained and verified. Time-out conducted. Noted no overlying erythema, induration, or other signs of local infection. Skin prepped in a sterile fashion. Topical analgesic spray: Ethyl chloride. Completed without difficulty. Meds: 80 mg depomedrol, 3 cc 1% lidocaine Pain immediately improved suggesting accurate placement of the medication. Advised to call if fevers/chills, erythema, induration, drainage, or persistent bleeding.  Signed,  Balinda Quails, DO [facility] 4:38 PM 09/25/16

## 2016-09-25 NOTE — Assessment & Plan Note (Signed)
Reviewed 4 view knee x-rays which show moderate patellofemoral arthritis.  None seen medial or laterally, but not standing views.  Injection performed, see note.  Pt tolerated well.  Follow up if not improved in 3 weeks.  Follow up care instructions given.

## 2016-09-25 NOTE — Patient Instructions (Addendum)
     IF you received an x-ray today, you will receive an invoice from [facility]. Please contact [facility] at [phone] with questions or concerns regarding your invoice.   IF you received labwork today, you will receive an invoice from [facility]. Please contact [facility] at [phone] with questions or concerns regarding your invoice.   Our billing staff will not be able to assist you with questions regarding bills from these companies.  You will be contacted with the lab results as soon as they are available. The fastest way to get your results is to activate your My Chart account. Instructions are located on the last page of this paperwork. If you have not heard from [place] regarding the results in 2 weeks, please contact this office.     Knee Injection, Care After Refer to this sheet in the next few weeks. These instructions provide you with information about caring for yourself after your procedure. Your health care provider may also give you more specific instructions. Your treatment has been planned according to current medical practices, but problems sometimes occur. Call your health care provider if you have any problems or questions after your procedure. What can I expect after the procedure? After the procedure, it is common to have:  Soreness.  Warmth.  Swelling. You may have more pain, swelling, and warmth than you did before the injection. This reaction may last for about one day. Follow these instructions at home: Bathing   If you were given a bandage (dressing), keep it dry until your health care provider says it can be removed. Ask your health care provider when you can start showering or taking a bath. Managing pain, stiffness, and swelling   If directed, apply ice to the injection area:  Put ice in a plastic bag.  Place a towel between your skin and the bag.  Leave the ice on for 20 minutes, 2-3 times per day.  Do not apply heat to your  knee.  Raise the injection area above the level of your heart while you are sitting or lying down. Activity   Avoid strenuous activities for as long as directed by your health care provider. Ask your health care provider when you can return to your normal activities. General instructions   Take medicines only as directed by your health care provider.  Do not take aspirin or other over-the-counter medicines unless your health care provider says you can.  Check your injection site every day for signs of infection. Watch for:  Redness, swelling, or pain.  Fluid, blood, or pus.  Follow your health care provider's instructions about dressing changes and removal. Contact a health care provider if:  You have symptoms at your injection site that last longer than two days after your procedure.  You have redness, swelling, or pain in your injection area.  You have fluid, blood, or pus coming from your injection site.  You have warmth in your injection area.  You have a fever.  Your pain is not controlled with medicine. Get help right away if:  Your knee turns very red.  Your knee becomes very swollen.  Your knee pain is severe. This information is not intended to replace advice given to you by your health care provider. Make sure you discuss any questions you have with your health care provider. Document Released: 05/28/2014 Document Revised: 01/11/2016 Document Reviewed: 03/17/2014 Elsevier Interactive Patient Education  2017 [employer].

## 2016-09-26 ENCOUNTER — Ambulatory Visit (INDEPENDENT_AMBULATORY_CARE_PROVIDER_SITE_OTHER): Payer: Medicare Other | Admitting: Neurology

## 2016-09-26 ENCOUNTER — Encounter: Payer: Self-pay | Admitting: Neurology

## 2016-09-26 VITALS — BP 149/74 | HR 84 | Ht 66.0 in | Wt 240.5 lb

## 2016-09-26 DIAGNOSIS — M79601 Pain in right arm: Secondary | ICD-10-CM

## 2016-09-26 DIAGNOSIS — M797 Fibromyalgia: Secondary | ICD-10-CM | POA: Diagnosis not present

## 2016-09-26 MED ORDER — GABAPENTIN 100 MG PO CAPS
100.0000 mg | ORAL_CAPSULE | Freq: Three times a day (TID) | ORAL | 2 refills | Status: DC
Start: 2016-09-26 — End: 2017-01-25

## 2016-09-26 NOTE — Progress Notes (Signed)
Reason for visit: Right arm pain  Referring physician: Dr. [name] is a 76 y.o. female  History of present illness:  [name] is a 76 year old right handed white female with at history of right arm pain for over one year.  She indicates that she had a pseudoaneurysm in the radial artery of the right forearm after a coronary artery catheterization. The patient required repair of this. The pain has persisted in this region of the surgery, with some radiation of pain into the forearm up to the elbow, and into the thumb area of the right hand. The patient denies any true numbness. The pain has a burning quality to it recently. She may have increased pain when she tries to open a jar or grip with the right hand. The patient does have some neck pain mainly on the left side, without pain radiating from the neck down the right arm. The patient denies any symptoms of the left arm. She does have some chronic low back pain, she has degenerative arthritis involving the right knee, and she has fibromyalgia pain. Currently she is taking Ultram for the right arm pain with some benefit. The patient denies that the pain can be induced in the right arm by turning the head in any particular position. She is sent to this office for an evaluation.  Past Medical History:  Diagnosis Date  . Anxiety   . Coronary artery disease    a. s/p MI 2009 - PCI/DES distal  RCA w/ 2.75 x 18 Xience DES;  b. 05/2010 Myoview: Apical thinning, EF 73%;  c. 06/2010 Cath: moderate nonobs dzs, EF 65%;  d.  Lexiscan Myoview (07/2013):  No scar or ischemia, EF 67%; Normal Study  . Environmental allergies   . Essential iris atrophy    Right side  . Fatigue   . Fibromyalgia   . GERD (gastroesophageal reflux disease)   . Glaucoma   . Hyperlipidemia   . Hypertension   . Menopause   . Obesity (BMI 30-39.9)   . OSA (obstructive sleep apnea)    does not wear CPAP  . Osteoarthritis   . Oxygen dependent    3L    Past  Surgical History:  Procedure Laterality Date  . CARDIAC CATHETERIZATION  06/29/2010   Mild to moderate coronary artery irregularities.  Her  proximal left anterior descending artery is moderately narrowed.  She  also has an eccentric stenosis in the proximal LAD.  These do not appear  to obstruct flow at present, but they are fairly small vessels.  They  appeared to be between 1.5 and 2 mm in diamete  . CARDIAC CATHETERIZATION  08/02/2009   Patent stent  . CARDIAC CATHETERIZATION  05/12/2008   Stent to the distal RCA  . CARDIAC CATHETERIZATION N/A 09/20/2015   Procedure: Left Heart Cath and Coronary Angiography;  Surgeon: Jettie Booze, MD;  Location: [facility];  Service: Cardiovascular;  Laterality: N/A;  . CARDIAC CATHETERIZATION  09/20/2015   Procedure: Intravascular Ultrasound/IVUS;  Surgeon: Jettie Booze, MD;  Location: [place] [place] [facility];  Service: Cardiovascular;;  . CARDIAC CATHETERIZATION  09/20/2015   Procedure: Intravascular Pressure Wire/FFR Study;  Surgeon: Jettie Booze, MD;  Location: [place] [place] [facility];  Service: Cardiovascular;;  . CLIPPING OF ATRIAL APPENDAGE N/A 09/23/2015   Procedure:  CLIPPING OF ATRIAL APPENDAGE;  Surgeon: Grace Isaac, MD;  Location: [facility];  Service: Open Heart Surgery;  Laterality:  N/A;  . CORONARY ARTERY BYPASS GRAFT N/A 09/23/2015   Procedure: CORONARY ARTERY BYPASS GRAFTING (CABG) TIMES 1 USING LEFT INTERNAL MAMMARY TO THE LAD;  Surgeon: Grace Isaac, MD;  Location: [facility];  Service: Open Heart Surgery;  Laterality: N/A;  . MAZE N/A 09/23/2015   Procedure: MAZE;  Surgeon: Grace Isaac, MD;  Location: [place];  Service: Open Heart Surgery;  Laterality: N/A;  . RESECTION OF ARTERIOVENOUS FISTULA ANEURYSM Right 10/21/2015   Procedure: SUTURE REPAIR OF RADIAL ARTERY AND EVACUATION OF HEMATOMA;  Surgeon: Mal Misty, MD;  Location: [place];  Service: Vascular;  Laterality: Right;  . TEE WITHOUT CARDIOVERSION N/A 09/23/2015    Procedure: TRANSESOPHAGEAL ECHOCARDIOGRAM (TEE);  Surgeon: Grace Isaac, MD;  Location: [facility];  Service: Open Heart Surgery;  Laterality: N/A;    Family History  Problem Relation Age of Onset  . Heart attack Mother   . Cancer Father   . Coronary artery disease Brother     had CABG  . Coronary artery disease Brother   . Coronary artery disease Brother   . Coronary artery disease Brother   . Coronary artery disease Brother   . Heart attack Brother     Social history:  reports that she quit smoking about 56 years ago. Her smoking use included Cigarettes. She has never used smokeless tobacco. She reports that she does not drink alcohol or use drugs.  Medications:  Prior to Admission medications   Medication Sig Start Date End Date Taking? Authorizing Provider  acetaminophen (TYLENOL) 500 MG tablet Take 1,000 mg by mouth every 8 (eight) hours as needed for moderate pain.   Yes [provider]  apixaban (ELIQUIS) 5 MG TABS tablet TAKE 1 TABLET(5 MG) BY MOUTH TWICE DAILY 03/07/16  Yes Nahser, Wonda Cheng, MD  aspirin 325 MG tablet Take 325 mg by mouth.   Yes [provider]  Cyanocobalamin (VITAMIN B-12 PO) Take 1 tablet by mouth daily.   Yes [provider]  ferrous sulfate 325 (65 FE) MG tablet Take 325 mg by mouth every morning.    Yes [provider]  furosemide (LASIX) 40 MG tablet Take 1 tablet (40 mg total) by mouth daily. 03/07/16  Yes Nahser, Wonda Cheng, MD  lisinopril (PRINIVIL,ZESTRIL) 20 MG tablet Take 1 tablet (20 mg total) by mouth daily. 03/07/16  Yes Nahser, Wonda Cheng, MD  metoprolol tartrate (LOPRESSOR) 25 MG tablet Take 1 tablet (25 mg total) by mouth 2 (two) times daily. 03/07/16  Yes Nahser, Wonda Cheng, MD  nitrofurantoin (MACRODANTIN) 100 MG capsule Take 100 mg by mouth daily.  12/22/13  Yes [provider]  pantoprazole (PROTONIX) 40 MG tablet Take 40 mg by mouth daily.  01/07/14  Yes [provider]  polyethylene glycol  (MIRALAX / GLYCOLAX) packet Take 17 g by mouth daily. 09/30/15  Yes Barrett, Erin R, PA-C  potassium chloride (K-DUR) 10 MEQ tablet Take 1 tablet (10 mEq total) by mouth daily. 03/07/16  Yes Nahser, Wonda Cheng, MD  pramipexole (MIRAPEX) 1 MG tablet Take 1-3 mg by mouth at bedtime.    Yes [provider]  traMADol (ULTRAM) 50 MG tablet Take one tablet by mouth every 4 hours as needed for moderate pain; Take two tablets by mouth every 4 hours as needed for severe pain 10/06/15  Yes Reed, Tiffany L, DO  venlafaxine (EFFEXOR) 37.5 MG tablet Take 37.5 mg by mouth 2 (two) times daily.   Yes [provider]  nitroGLYCERIN (  NITROSTAT) 0.4 MG SL tablet Place 1 tablet (0.4 mg total) under the tongue every 5 (five) minutes as needed for chest pain. 02/23/16 09/19/16  Nahser, Wonda Cheng, MD      Allergies  Allergen Reactions  . Other Nausea Only    NO MEAT!!  . Sulfamethoxazole-Trimethoprim Nausea Only  . Cortisone Nausea Only and Other (See Comments)    Pt gets very hot and flushed  . Latex Rash    Redness and irritation  . Tape Rash    Redness and irritation    ROS:  Out of a complete 14 system review of symptoms, the patient complains only of the following symptoms, and all other reviewed systems are negative.  Right arm and hand pain Joint pain, joint swelling, aching muscles  Blood pressure (!) 149/74, pulse 84, height 5\' 6"  (1.676 m), weight 240 lb 8 oz (109.1 kg).  Physical Exam  General: The patient is alert and cooperative at the time of the examination. The patient is moderately to markedly obese.  Eyes: Pupils are equal, round, and reactive to light. Discs are flat bilaterally.  Neck: The neck is supple, no carotid bruits are noted.  Respiratory: The respiratory examination is clear.  Cardiovascular: The cardiovascular examination reveals a regular rate and rhythm, no obvious murmurs or rubs are noted.  Skin: Extremities are with 2+ edema below the knees  bilaterally.  Neurologic Exam  Mental status: The patient is alert and oriented x 3 at the time of the examination. The patient has apparent normal recent and remote memory, with an apparently normal attention span and concentration ability.  Cranial nerves: Facial symmetry is present. There is good sensation of the face to pinprick and soft touch bilaterally. The strength of the facial muscles and the muscles to head turning and shoulder shrug are normal bilaterally. Speech is well enunciated, no aphasia or dysarthria is noted. Extraocular movements are full. Visual fields are full. The tongue is midline, and the patient has symmetric elevation of the soft palate. No obvious hearing deficits are noted.  Motor: The motor testing reveals 5 over 5 strength of all 4 extremities. Good symmetric motor tone is noted throughout.  Sensory: Sensory testing is intact to pinprick, soft touch, vibration sensation, and position sense on all 4 extremities. No evidence of extinction is noted.  Coordination: Cerebellar testing reveals good finger-nose-finger and heel-to-shin bilaterally.  Gait and station: Gait is normal. Tandem gait is slightly unsteady. Romberg is negative. No drift is seen.  Reflexes: Deep tendon reflexes are symmetric and normal bilaterally, with exception that there is a decrease of the right biceps reflex is compared to the left. Toes are downgoing bilaterally.   Assessment/Plan:  1. Right arm discomfort  The patient reports some pain in the right forearm following repair of a pseudoaneurysm. The patient has no sensory alteration or weakness in the right arm by clinical examination. She will be set up for nerve conduction studies on both arms and EMG on the right arm. There is a decrease in the right biceps reflex. She will return for the above study. We will give her a trial on low-dose gabapentin for the discomfort. She will call for dose adjustments of the medication.  Jill Alexanders MD 09/26/2016 9:52 AM  [facility] [address] [place], [place] [address]  Phone [phone] Fax [phone]

## 2016-09-26 NOTE — Patient Instructions (Signed)
   We will check EMG and NCV of the right arm to look at the nerve function of the right arm.   We will start gabapentin for the pain.  Neurontin (gabapentin) may result in drowsiness, ankle swelling, gait instability, or possibly dizziness. Please contact our office if significant side effects occur with this medication.

## 2016-10-03 ENCOUNTER — Inpatient Hospital Stay (HOSPITAL_COMMUNITY): Admission: RE | Admit: 2016-10-03 | Payer: Medicare Other | Source: Ambulatory Visit

## 2016-10-10 ENCOUNTER — Ambulatory Visit (INDEPENDENT_AMBULATORY_CARE_PROVIDER_SITE_OTHER): Payer: Medicare Other | Admitting: Neurology

## 2016-10-10 ENCOUNTER — Encounter: Payer: Self-pay | Admitting: Neurology

## 2016-10-10 ENCOUNTER — Ambulatory Visit (INDEPENDENT_AMBULATORY_CARE_PROVIDER_SITE_OTHER): Payer: Self-pay | Admitting: Neurology

## 2016-10-10 DIAGNOSIS — M79601 Pain in right arm: Secondary | ICD-10-CM

## 2016-10-10 NOTE — Procedures (Signed)
     HISTORY:  [name] is a 76 year old patient with a history of a pseudoaneurysm repair in the right forearm, she has had some discomfort in the forearm since that time, particularly with repetitive use involving the right hand. The patient is being evaluated for a possible neuropathy or a cervical radiculopathy.  NERVE CONDUCTION STUDIES:  Nerve conduction studies were performed on right upper extremity. The distal motor latencies and motor amplitudes for the median and ulnar nerves were within normal limits. The nerve conduction velocities for these nerves were also normal. The sensory latencies for the median, radial, and ulnar nerves were normal. The F wave latency for the right ulnar nerve was normal.   EMG STUDIES:  EMG study was performed on the right upper extremity:  The first dorsal interosseous muscle reveals 2 to 4 K units with full recruitment. No fibrillations or positive waves were noted. The abductor pollicis brevis muscle reveals 2 to 4 K units with full recruitment. No fibrillations or positive waves were noted. The extensor indicis proprius muscle reveals 1 to 3 K units with full recruitment. No fibrillations or positive waves were noted. The pronator teres muscle reveals 2 to 3 K units with full recruitment. No fibrillations or positive waves were noted. The biceps muscle reveals 1 to 2 K units with full recruitment. No fibrillations or positive waves were noted. The triceps muscle reveals 2 to 4 K units with full recruitment. No fibrillations or positive waves were noted. The anterior deltoid muscle reveals 2 to 3 K units with full recruitment. No fibrillations or positive waves were noted. The cervical paraspinal muscles were tested at 2 levels. No abnormalities of insertional activity were seen at either level tested. There was good relaxation.   IMPRESSION:  Nerve conduction studies studies done on the right upper extremity were unremarkable, there is no  evidence of a neuropathy. EMG evaluation of the right upper extremity is unremarkable, without evidence of an overlying cervical radiculopathy.  Jill Alexanders MD 10/10/2016 4:16 PM  [facility] [address] [place], [place] [address]  Phone [phone] Fax [phone]

## 2016-10-10 NOTE — Progress Notes (Signed)
Please refer to EMG and nerve conduction study procedure note. 

## 2016-10-10 NOTE — Progress Notes (Signed)
The patient comes to the office today for EMG nerve conduction study evaluation. The study is completely normal, there is no evidence of a neuropathy or a cervical radiculopathy.  The patient never tried the gabapentin for pain. She indicates that the pain in the right arm and forearm seems to come on with activity of the hand, and is better with rest. The patient is to wear a wrist point over the next couple months to see how she does.  She will follow-up through this office if needed. The patient may have a tendinopathy or scar tissue in the forearm resulting in discomfort.

## 2016-11-05 ENCOUNTER — Encounter (HOSPITAL_COMMUNITY): Payer: Self-pay

## 2016-11-05 ENCOUNTER — Emergency Department (HOSPITAL_COMMUNITY)
Admission: EM | Admit: 2016-11-05 | Discharge: 2016-11-05 | Disposition: A | Discharge status: Home / Self Care | Payer: Medicare Other | Attending: Emergency Medicine | Admitting: Emergency Medicine

## 2016-11-05 DIAGNOSIS — M25561 Pain in right knee: Secondary | ICD-10-CM | POA: Diagnosis not present

## 2016-11-05 DIAGNOSIS — G8929 Other chronic pain: Secondary | ICD-10-CM | POA: Diagnosis not present

## 2016-11-05 DIAGNOSIS — Z87891 Personal history of nicotine dependence: Secondary | ICD-10-CM | POA: Diagnosis not present

## 2016-11-05 DIAGNOSIS — I10 Essential (primary) hypertension: Secondary | ICD-10-CM | POA: Diagnosis not present

## 2016-11-05 DIAGNOSIS — Z951 Presence of aortocoronary bypass graft: Secondary | ICD-10-CM | POA: Diagnosis not present

## 2016-11-05 DIAGNOSIS — I2510 Atherosclerotic heart disease of native coronary artery without angina pectoris: Secondary | ICD-10-CM | POA: Diagnosis not present

## 2016-11-05 DIAGNOSIS — Z7901 Long term (current) use of anticoagulants: Secondary | ICD-10-CM | POA: Diagnosis not present

## 2016-11-05 DIAGNOSIS — Z79899 Other long term (current) drug therapy: Secondary | ICD-10-CM | POA: Diagnosis not present

## 2016-11-05 DIAGNOSIS — I252 Old myocardial infarction: Secondary | ICD-10-CM | POA: Insufficient documentation

## 2016-11-05 DIAGNOSIS — Z91040 Latex allergy status: Secondary | ICD-10-CM | POA: Insufficient documentation

## 2016-11-05 DIAGNOSIS — Z7982 Long term (current) use of aspirin: Secondary | ICD-10-CM | POA: Insufficient documentation

## 2016-11-05 MED ORDER — HYDROCODONE-ACETAMINOPHEN 5-325 MG PO TABS
1.0000 | ORAL_TABLET | Freq: Once | ORAL | Status: AC
  Administered 2016-11-05: 1 via ORAL
  Filled 2016-11-05: qty 1

## 2016-11-05 NOTE — ED Triage Notes (Signed)
Pt complaining of R knee pain. Pt denies any new injury/trauma. Pt states hx of arthritis. Pt ambulatory at triage.

## 2016-11-05 NOTE — ED Notes (Addendum)
Pt presents today with knee pain that has been going on for a while. Pt states her pain is worse when moving but she doesn't have any while sitting still with no activity.

## 2016-11-05 NOTE — ED Notes (Signed)
Pt ambulated with no problems. Pt states her knees hurt but not as bad.

## 2016-11-05 NOTE — ED Provider Notes (Signed)
[facility] DEPT Provider Note   CSN: [id number] Arrival date & time: 11/05/16  0127     History   Chief Complaint Chief Complaint  Patient presents with  . Knee Pain    HPI [name] is a 76 y.o. female.  The history is provided by the patient.  Knee Pain   This is a chronic problem. The current episode started more than 1 week ago. The problem occurs daily. The problem has been gradually worsening. The pain is present in the right knee. The pain is moderate. Associated symptoms include limited range of motion and stiffness. The symptoms are aggravated by standing. Treatments tried: tramadol. The treatment provided no relief.  pt reports long h/o right knee pain, reports it has been worsening over past several days No fall/trauma No fever/vomiting No calf tenderness She mentions brief episode of CP earlier but now resolved and appears unrelated   Past Medical History:  Diagnosis Date  . Anxiety   . Coronary artery disease    a. s/p MI 2009 - PCI/DES distal  RCA w/ 2.75 x 18 Xience DES;  b. 05/2010 Myoview: Apical thinning, EF 73%;  c. 06/2010 Cath: moderate nonobs dzs, EF 65%;  d.  Lexiscan Myoview (07/2013):  No scar or ischemia, EF 67%; Normal Study  . Environmental allergies   . Essential iris atrophy    Right side  . Fatigue   . Fibromyalgia   . GERD (gastroesophageal reflux disease)   . Glaucoma   . Hyperlipidemia   . Hypertension   . Menopause   . Obesity (BMI 30-39.9)   . OSA (obstructive sleep apnea)    does not wear CPAP  . Osteoarthritis   . Oxygen dependent    3L    Patient Active Problem List   Diagnosis Date Noted  . Right arm pain 09/26/2016  . Acute pain of right knee 09/25/2016  . Acute bronchitis 04/29/2016  . Depression 04/29/2016  . Chronic fatigue syndrome 04/29/2016  . Dyspnea 04/27/2016  . Exertional dyspnea 04/25/2016  . 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 Pseudoaneurysm ([place]) 10/18/2015  . Anxiety state  10/03/2015  . CAD (coronary artery disease) 09/23/2015  . Atrial fibrillation ([place]) 12/19/2014  . Multiple thyroid nodules 01/12/2014  . OSA (obstructive sleep apnea)   . Obesity (BMI 30-39.9)   . Sleep apnea 06/22/2013  . Fibromyalgia   . Coronary artery disease   . Hypertension   . Hyperlipidemia   . Fatigue   . History of myocardial infarction     Past Surgical History:  Procedure Laterality Date  . CARDIAC CATHETERIZATION  06/29/2010   Mild to moderate coronary artery irregularities.  Her  proximal left anterior descending artery is moderately narrowed.  She  also has an eccentric stenosis in the proximal LAD.  These do not appear  to obstruct flow at present, but they are fairly small vessels.  They  appeared to be between 1.5 and 2 mm in diamete  . CARDIAC CATHETERIZATION  08/02/2009   Patent stent  . CARDIAC CATHETERIZATION  05/12/2008   Stent to the distal RCA  . CARDIAC CATHETERIZATION N/A 09/20/2015   Procedure: Left Heart Cath and Coronary Angiography;  Surgeon: Jettie Booze, MD;  Location: [facility] [facility];  Service: Cardiovascular;  Laterality: N/A;  . CARDIAC CATHETERIZATION  09/20/2015   Procedure: Intravascular Ultrasound/IVUS;  Surgeon: Jettie Booze, MD;  Location: [facility] [facility];  Service: Cardiovascular;;  . CARDIAC CATHETERIZATION  09/20/2015  Procedure: Intravascular Pressure Wire/FFR Study;  Surgeon: Jettie Booze, MD;  Location: [facility] [place] [facility];  Service: Cardiovascular;;  . CLIPPING OF ATRIAL APPENDAGE N/A 09/23/2015   Procedure:  CLIPPING OF ATRIAL APPENDAGE;  Surgeon: Grace Isaac, MD;  Location: [facility];  Service: Open Heart Surgery;  Laterality: N/A;  . CORONARY ARTERY BYPASS GRAFT N/A 09/23/2015   Procedure: CORONARY ARTERY BYPASS GRAFTING (CABG) TIMES 1 USING LEFT INTERNAL MAMMARY TO THE LAD;  Surgeon: Grace Isaac, MD;  Location: [place];  Service: Open Heart Surgery;  Laterality: N/A;  . MAZE N/A 09/23/2015   Procedure: MAZE;   Surgeon: Grace Isaac, MD;  Location: [facility];  Service: Open Heart Surgery;  Laterality: N/A;  . RESECTION OF ARTERIOVENOUS FISTULA ANEURYSM Right 10/21/2015   Procedure: SUTURE REPAIR OF RADIAL ARTERY AND EVACUATION OF HEMATOMA;  Surgeon: Mal Misty, MD;  Location: [place];  Service: Vascular;  Laterality: Right;  . TEE WITHOUT CARDIOVERSION N/A 09/23/2015   Procedure: TRANSESOPHAGEAL ECHOCARDIOGRAM (TEE);  Surgeon: Grace Isaac, MD;  Location: [place];  Service: Open Heart Surgery;  Laterality: N/A;    OB History    No data available       Home Medications    Prior to Admission medications   Medication Sig Start Date End Date Taking? Authorizing Provider  acetaminophen (TYLENOL) 500 MG tablet Take 1,000 mg by mouth every 8 (eight) hours as needed for moderate pain.    [provider]  apixaban (ELIQUIS) 5 MG TABS tablet TAKE 1 TABLET(5 MG) BY MOUTH TWICE DAILY 03/07/16   Nahser, Wonda Cheng, MD  aspirin 325 MG tablet Take 325 mg by mouth.    [provider]  Cyanocobalamin (VITAMIN B-12 PO) Take 1 tablet by mouth daily.    [provider]  ferrous sulfate 325 (65 FE) MG tablet Take 325 mg by mouth every morning.     [provider]  furosemide (LASIX) 40 MG tablet Take 1 tablet (40 mg total) by mouth daily. 03/07/16   Nahser, Wonda Cheng, MD  gabapentin (NEURONTIN) 100 MG capsule Take 1 capsule (100 mg total) by mouth 3 (three) times daily. 09/26/16   Kathrynn Ducking, MD  lisinopril (PRINIVIL,ZESTRIL) 20 MG tablet Take 1 tablet (20 mg total) by mouth daily. 03/07/16   Nahser, Wonda Cheng, MD  metoprolol tartrate (LOPRESSOR) 25 MG tablet Take 1 tablet (25 mg total) by mouth 2 (two) times daily. 03/07/16   Nahser, Wonda Cheng, MD  nitrofurantoin (MACRODANTIN) 100 MG capsule Take 100 mg by mouth daily.  12/22/13   [provider]  nitroGLYCERIN (NITROSTAT) 0.4 MG SL tablet Place 1 tablet (0.4 mg total) under the tongue every 5 (five) minutes as needed for  chest pain. 02/23/16 09/19/16  Nahser, Wonda Cheng, MD  pantoprazole (PROTONIX) 40 MG tablet Take 40 mg by mouth daily.  01/07/14   [provider]  polyethylene glycol (MIRALAX / GLYCOLAX) packet Take 17 g by mouth daily. 09/30/15   Barrett, Erin R, PA-C  potassium chloride (K-DUR) 10 MEQ tablet Take 1 tablet (10 mEq total) by mouth daily. 03/07/16   Nahser, Wonda Cheng, MD  pramipexole (MIRAPEX) 1 MG tablet Take 1-3 mg by mouth at bedtime.     [provider]  traMADol (ULTRAM) 50 MG tablet Take one tablet by mouth every 4 hours as needed for moderate pain; Take two tablets by mouth every 4 hours as needed for severe pain 10/06/15   Mariea Clonts, Tiffany L,  DO  venlafaxine (EFFEXOR) 37.5 MG tablet Take 37.5 mg by mouth 2 (two) times daily.    [provider]    Family History Family History  Problem Relation Age of Onset  . Heart attack Mother   . Cancer Father   . Coronary artery disease Brother        had CABG  . Coronary artery disease Brother   . Coronary artery disease Brother   . Coronary artery disease Brother   . Coronary artery disease Brother   . Heart attack Brother     Social History Social History  Substance Use Topics  . Smoking status: Former Smoker    Types: Cigarettes    Quit date: 09/13/1960  . Smokeless tobacco: Never Used  . Alcohol use No     Allergies   Other; Sulfamethoxazole-trimethoprim; Cortisone; Latex; and Tape   Review of Systems Review of Systems  Cardiovascular:       Brief CP, now resolved   Gastrointestinal: Negative for vomiting.  Musculoskeletal: Positive for arthralgias and stiffness.  All other systems reviewed and are negative.    Physical Exam Updated Vital Signs BP (!) 141/48 (BP Location: Right Arm)   Pulse 63   Temp 98 F (36.7 C) (Oral)   Resp 18   SpO2 97%   Physical Exam CONSTITUTIONAL: elderly, no acute distress HEAD: Normocephalic/atraumatic ENMT: Mucous membranes moist NECK: supple no meningeal  signs SPINE/BACK:entire spine nontender CV: S1/S2 noted, no murmurs/rubs/gallops noted LUNGS: Lungs are clear to auscultation bilaterally, no apparent distress ABDOMEN: soft, nontender GU:no cva tenderness NEURO: Pt is awake/alert/appropriate, moves all extremitiesx4.  No facial droop.   EXTREMITIES: pulses normal/equal, full ROM.  Mild tenderness to palpation of patella.  No erythema.  No edema.  No deformity.  She can fully range right knee but has pain elicited with movement.  Distal pulses intact.  No calf tenderness/edema SKIN: warm, color normal PSYCH: no abnormalities of mood noted, alert and oriented to situation   ED Treatments / Results  Labs (all labs ordered are listed, but only abnormal results are displayed) Labs Reviewed - No data to display  EKG  EKG Interpretation None       Radiology No results found.  Procedures Procedures (including critical care time)  Medications Ordered in ED Medications  HYDROcodone-acetaminophen (NORCO/VICODIN) 5-325 MG per tablet 1 tablet (not administered)     Initial Impression / Assessment and Plan / ED Course  I have reviewed the triage vital signs and the nursing notes. Narcotic database reviewed and considered in decision making      5:32 AM Pt with long h/o right knee arthritis, has had recent knee xray and had depo injection back in may This is a chronic issue She has tramadol at home, advised to continue this med No signs of DVT No trauma No signs of septic jont 6:40 AM Pt improved Watching Tv, no distress She is ambulatory She feels comfortable for d/c home Referred to ortho Pt agreeable with plan No further CP episodes, defer workup for now (she mentioned CP on review of system)  Final Clinical Impressions(s) / ED Diagnoses   Final diagnoses:  Chronic pain of right knee    New Prescriptions New Prescriptions   No medications on file     Ripley Fraise, MD 11/05/16 [phone]

## 2017-01-23 ENCOUNTER — Encounter: Payer: Self-pay | Admitting: Physician Assistant

## 2017-01-23 ENCOUNTER — Ambulatory Visit (INDEPENDENT_AMBULATORY_CARE_PROVIDER_SITE_OTHER): Payer: Medicare Other | Admitting: Physician Assistant

## 2017-01-23 VITALS — BP 154/71 | HR 77 | Temp 98.1°F | Resp 17 | Ht 66.0 in | Wt 241.0 lb

## 2017-01-23 DIAGNOSIS — E039 Hypothyroidism, unspecified: Secondary | ICD-10-CM | POA: Diagnosis not present

## 2017-01-23 DIAGNOSIS — R5382 Chronic fatigue, unspecified: Secondary | ICD-10-CM | POA: Diagnosis not present

## 2017-01-23 DIAGNOSIS — F341 Dysthymic disorder: Secondary | ICD-10-CM | POA: Diagnosis not present

## 2017-01-23 DIAGNOSIS — M545 Low back pain, unspecified: Secondary | ICD-10-CM

## 2017-01-23 DIAGNOSIS — G4733 Obstructive sleep apnea (adult) (pediatric): Secondary | ICD-10-CM | POA: Diagnosis not present

## 2017-01-23 LAB — POCT CBC
GRANULOCYTE PERCENT: 61.2 % (ref 37–80)
HCT, POC: 36.4 % — AB (ref 37.7–47.9)
HEMOGLOBIN: 12.6 g/dL (ref 12.2–16.2)
Lymph, poc: 2.8 (ref 0.6–3.4)
MCH: 30 pg (ref 27–31.2)
MCHC: 34.7 g/dL (ref 31.8–35.4)
MCV: 86.5 fL (ref 80–97)
MID (cbc): 0.5 (ref 0–0.9)
MPV: 8.8 fL (ref 0–99.8)
PLATELET COUNT, POC: 218 10*3/uL (ref 142–424)
POC Granulocyte: 5.2 (ref 2–6.9)
POC LYMPH PERCENT: 33.1 %L (ref 10–50)
POC MID %: 5.7 %M (ref 0–12)
RBC: 4.21 M/uL (ref 4.04–5.48)
RDW, POC: 15 %
WBC: 8.5 10*3/uL (ref 4.6–10.2)

## 2017-01-23 LAB — POCT URINALYSIS DIP (MANUAL ENTRY)
Bilirubin, UA: NEGATIVE
GLUCOSE UA: NEGATIVE mg/dL
Ketones, POC UA: NEGATIVE mg/dL
Leukocytes, UA: NEGATIVE
NITRITE UA: NEGATIVE
PH UA: 6.5 (ref 5.0–8.0)
PROTEIN UA: NEGATIVE mg/dL
RBC UA: NEGATIVE
SPEC GRAV UA: 1.01 (ref 1.010–1.025)
UROBILINOGEN UA: 0.2 U/dL

## 2017-01-23 LAB — POC MICROSCOPIC URINALYSIS (UMFC): Mucus: ABSENT

## 2017-01-23 NOTE — Patient Instructions (Addendum)
Take two venlafaine in the morning and one at night.      IF you received an x-ray today, you will receive an invoice from [facility]. Please contact [facility] at [phone] with questions or concerns regarding your invoice.   IF you received labwork today, you will receive an invoice from [facility]. Please contact [facility] at [phone] with questions or concerns regarding your invoice.   Our billing staff will not be able to assist you with questions regarding bills from these companies.  You will be contacted with the lab results as soon as they are available. The fastest way to get your results is to activate your My Chart account. Instructions are located on the last page of this paperwork. If you have not heard from [place] regarding the results in 2 weeks, please contact this office.

## 2017-01-23 NOTE — Progress Notes (Signed)
01/25/2017 1:52 PM   DOB: 1940-07-19 / MRN: [id number]  SUBJECTIVE:  [name] is a 76 y.o. female presenting for back pain that started a couple weeks ago.  Associates urinary urgency and frequency.  Notes having some pain about the right lower quadrant that does get worse with movement.  Denies dysuria.  She is moving her bowels normally for her and daily. Assoicates leg swelling.    Goes to bed around 10 pm but may be 11 or 12.  She does endorse night time awakenings with difficulty getting back to sleep.  She is an Mining engineer. Feels exhausted. Was going to live with her son but that did not work out.  Never has enough money.  Wants to travel.  Wants to open and bed and breakfast for she can travel and speak.  Can't afford the down payment.   She is allergic to other; sulfamethoxazole-trimethoprim; cortisone; latex; and tape.   She  has a past medical history of Anxiety; Coronary artery disease; Environmental allergies; Essential iris atrophy; Fatigue; Fibromyalgia; GERD (gastroesophageal reflux disease); Glaucoma; Hyperlipidemia; Hypertension; Menopause; Obesity (BMI 30-39.9); OSA (obstructive sleep apnea); Osteoarthritis; and Oxygen dependent.    She  reports that she quit smoking about 56 years ago. Her smoking use included Cigarettes. She has never used smokeless tobacco. She reports that she does not drink alcohol or use drugs. She  has no sexual activity history on file. 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  Her family history  includes Cancer in her father; Coronary artery disease in her brother, brother, brother, brother, and brother; Heart attack in her brother and mother.  Review of Systems  Constitutional: Positive for malaise/fatigue. Negative for chills, diaphoresis and fever.  Gastrointestinal: Negative for nausea and vomiting.  Skin: Negative for rash.  Neurological: Negative for dizziness.    The problem list and medications were reviewed and updated by myself where necessary and exist elsewhere in the encounter.   OBJECTIVE:  BP (!) 154/71   Pulse 77   Temp 98.1 F (36.7 C) (Oral)   Resp 17   Ht 5\' 6"  (1.676 m)   Wt 241 lb (109.3 kg)   SpO2 98%   BMI 38.90 kg/m   Physical Exam  Constitutional: She is oriented to person, place, and time. She is active.  Non-toxic appearance.  Eyes: Pupils are equal, round, and reactive to light. EOM are normal.  Cardiovascular: Normal rate, regular rhythm, S1 normal, S2 normal, normal heart sounds and intact distal pulses.  Exam reveals no gallop, no friction rub and no decreased pulses.   No murmur heard. Pulmonary/Chest: Effort normal. No stridor. No tachypnea. No respiratory distress. She has no wheezes. She has no rales.  Abdominal: She exhibits no distension.  Musculoskeletal: She exhibits no edema.  Neurological: She is alert and oriented to person, place, and time. She has normal strength and normal reflexes. She is not disoriented. She displays no atrophy. No cranial nerve deficit or sensory deficit. She exhibits normal muscle tone. Coordination and gait normal.  Skin: Skin is warm and dry. She is not diaphoretic. No pallor.  Psychiatric: Her behavior is normal. Judgment and thought content normal. Her affect is angry.  Her speech is not rapid and/or pressured. She is not agitated. Cognition and memory are normal. She exhibits a depressed mood ( very regretful).    Results for orders placed or performed in visit on 01/23/17 (from the past 72 hour(s))    POCT urinalysis dipstick     Status: None   Collection Time: 01/23/17  4:46 PM  Result Value Ref Range   Color, UA yellow yellow   Clarity, UA clear clear   Glucose, UA negative negative mg/dL   Bilirubin, UA negative negative   Ketones, POC UA negative negative mg/dL   Spec Grav, UA 1.010 1.010 - 1.025   Blood, UA negative negative   pH, UA 6.5 5.0 - 8.0   Protein Ur, POC negative negative mg/dL   Urobilinogen, UA 0.2 0.2 or 1.0 E.U./dL   Nitrite, UA Negative Negative   Leukocytes, UA Negative Negative  POCT CBC     Status: Abnormal   Collection Time: 01/23/17  4:47 PM  Result Value Ref Range   WBC 8.5 4.6 - 10.2 K/uL   Lymph, poc 2.8 0.6 - 3.4   POC LYMPH PERCENT 33.1 10 - 50 %L   MID (cbc) 0.5 0 - 0.9   POC MID % 5.7 0 - 12 %M   POC Granulocyte 5.2 2 - 6.9   Granulocyte percent 61.2 37 - 80 %G   RBC 4.21 4.04 - 5.48 M/uL   Hemoglobin 12.6 12.2 - 16.2 g/dL   HCT, POC 36.4 (A) 37.7 - 47.9 %   MCV 86.5 80 - 97 fL   MCH, POC 30.0 27 - 31.2 pg   MCHC 34.7 31.8 - 35.4 g/dL   RDW, POC 15.0 %   Platelet Count, POC 218 142 - 424 K/uL   MPV 8.8 0 - 99.8 fL  POCT Microscopic Urinalysis ([facility])     Status: None   Collection Time: 01/23/17  4:51 PM  Result Value Ref Range   WBC,UR,HPF,POC None None WBC/hpf   RBC,UR,HPF,POC None None RBC/hpf   Bacteria None None, Too numerous to count   Mucus Absent Absent   Epithelial Cells, UR Per Microscopy None None, Too numerous to count cells/hpf  T4, Free     Status: Abnormal   Collection Time: 01/23/17  5:23 PM  Result Value Ref Range   Free T4 0.78 (L) 0.82 - 1.77 ng/dL  Renal function panel     Status: Abnormal   Collection Time: 01/23/17  5:23 PM  Result Value Ref Range   Glucose 152 (H) 65 - 99 mg/dL   BUN 24 8 - 27 mg/dL   Creatinine, Ser 1.28 (H) 0.57 - 1.00 mg/dL   GFR calc non Af Amer 41 (L) >59 mL/min/1.73   GFR calc Af Amer 48 (L) >59 mL/min/1.73   BUN/Creatinine Ratio 19 12 - 28   Sodium 139 134 - 144 mmol/L    Potassium 4.0 3.5 - 5.2 mmol/L   Chloride 100 96 - 106 mmol/L   CO2 22 20 - 29 mmol/L   Calcium 9.7 8.7 - 10.3 mg/dL   Phosphorus 4.0 2.5 - 4.5 mg/dL   Albumin 4.0 3.5 - 4.8 g/dL   Lab Results  Component Value Date   TSH 4.732 (H) 04/25/2016   Lab Results  Component Value Date   HGBA1C 5.8 (H) 09/23/2015   Lab Results  Component Value Date   CREATININE 1.28 (H) 01/23/2017   BUN 24 01/23/2017   NA 139 01/23/2017   K 4.0 01/23/2017  CL 100 01/23/2017   CO2 22 01/23/2017   Lab Results  Component Value Date   CREATININE 1.28 (H) 01/23/2017   CREATININE 1.29 (H) 04/29/2016   CREATININE 1.23 (H) 04/28/2016     No results found.  ASSESSMENT AND PLAN:  Casia was seen today for back pain.  Diagnoses and all orders for this visit:  Right-sided low back pain without sciatica, unspecified chronicity: Kidneys stable. T4 chronically low.Will increase synthroid to next step up.  -     POCT urinalysis dipstick -     POCT Microscopic Urinalysis ([facility]) -     Renal function panel -     POCT CBC  Chronic fatigue: Her sleep apnea must be addressed.  Will refer her to an orthodontist given she can't tolerate the mask.  -     T4, Free  Dysthymia: Increasing her SNRI by 1/3.  Will see her back in about 1 month. Stopping gabapentin as she has not been taking this anyway and this could perpetuate depression and fatigue.  -     Ambulatory referral to Psychology  Uncontrolled nonfamilial obstructive sleep apnea -     Ambulatory referral to Orthodontics  Other orders -     Renal function panel    The patient is advised to call or return to clinic if she does not see an improvement in symptoms, or to seek the care of the closest emergency department if she worsens with the above plan.   Philis Fendt, MHS, PA-C [facility] [place] 01/25/2017 1:52 PM

## 2017-01-24 LAB — T4, FREE: Free T4: 0.78 ng/dL — ABNORMAL LOW (ref 0.82–1.77)

## 2017-01-24 LAB — RENAL FUNCTION PANEL
ALBUMIN: 4 g/dL (ref 3.5–4.8)
BUN/Creatinine Ratio: 19 (ref 12–28)
BUN: 24 mg/dL (ref 8–27)
CALCIUM: 9.7 mg/dL (ref 8.7–10.3)
CO2: 22 mmol/L (ref 20–29)
CREATININE: 1.28 mg/dL — AB (ref 0.57–1.00)
Chloride: 100 mmol/L (ref 96–106)
GFR calc Af Amer: 48 mL/min/{1.73_m2} — ABNORMAL LOW (ref >59)
GFR, EST NON AFRICAN AMERICAN: 41 mL/min/{1.73_m2} — AB (ref >59)
Glucose: 152 mg/dL — ABNORMAL HIGH (ref 65–99)
PHOSPHORUS: 4 mg/dL (ref 2.5–4.5)
POTASSIUM: 4 mmol/L (ref 3.5–5.2)
Sodium: 139 mmol/L (ref 134–144)

## 2017-01-25 MED ORDER — LEVOTHYROXINE SODIUM 50 MCG PO TABS: 50.0000 ug | ORAL_TABLET | Freq: Every day | ORAL | 0 refills | Status: DC

## 2017-01-29 ENCOUNTER — Telehealth: Payer: Self-pay | Admitting: Physician Assistant

## 2017-01-29 ENCOUNTER — Telehealth: Payer: Self-pay

## 2017-01-29 NOTE — Telephone Encounter (Signed)
Call placed to patient as requested, no answer on patient phone. Left message for patient to return call to office./ S.Lesean Woolverton,CMA

## 2017-01-29 NOTE — Telephone Encounter (Signed)
-----   Message from Tereasa Coop, PA-C sent at 01/25/2017  2:03 PM EDT ----- Leontine Locket,  Please let her know that her thyroid hormone is chronically low.  I'm starting her on synthroid 50 mcg daily.  Will recheck the t4 at her next visit on 9/24.    I've referred her to an orthodontist for a sleep apnea mouthpiece. I hope she will see Mosetta Anis in psychology as we discussed.

## 2017-01-29 NOTE — Telephone Encounter (Signed)
Pt is returning our call regarding her labs -she has questions about what is on my chart  Best number [phone]

## 2017-01-30 NOTE — Progress Notes (Signed)
Spoke with patient, states that she would to discuss results with provider prior to starting any medications. Patient scheduled for next week./ S.Orlena Garmon,CMA

## 2017-01-31 ENCOUNTER — Telehealth: Payer: Self-pay

## 2017-01-31 NOTE — Telephone Encounter (Signed)
Spoke with patient, states that she has questions regarding lab results and medications. Patient would like to see provider. Appointment made, documented in the chart./ S.Kaidence Callaway,CMA

## 2017-02-07 ENCOUNTER — Ambulatory Visit: Payer: Self-pay | Admitting: Physician Assistant

## 2017-02-07 ENCOUNTER — Ambulatory Visit: Payer: Medicare Other

## 2017-02-11 ENCOUNTER — Other Ambulatory Visit: Payer: Self-pay

## 2017-02-11 ENCOUNTER — Ambulatory Visit: Payer: Medicare Other | Admitting: Physician Assistant

## 2017-02-11 ENCOUNTER — Other Ambulatory Visit: Payer: Self-pay | Admitting: Cardiovascular Disease

## 2017-02-11 MED ORDER — METOPROLOL TARTRATE 25 MG PO TABS: 25.0000 mg | ORAL_TABLET | Freq: Two times a day (BID) | ORAL | 10 refills | Status: DC

## 2017-02-11 MED ORDER — APIXABAN 5 MG PO TABS: ORAL_TABLET | ORAL | 1 refills | Status: DC

## 2017-02-11 NOTE — Telephone Encounter (Signed)
Pt last saw Dr Acie Fredrickson on 09/19/16, last labs 04/29/16 Creat 1.29, age 76, weight 109.3kg, based on specified criteria pt is on appropriate dosage of Eliquis 5mg  BID.  Will refill rx.

## 2017-02-12 NOTE — Telephone Encounter (Signed)
Medication Detail    Disp Refills Start End   metoprolol tartrate (LOPRESSOR) 25 MG tablet 60 tablet 10 02/11/2017    Sig - Route: Take 1 tablet (25 mg total) by mouth 2 (two) times daily. - Oral   Sent to pharmacy as: metoprolol tartrate (LOPRESSOR) 25 MG tablet   E-Prescribing Status: Receipt confirmed by pharmacy (02/11/2017 4:19 PM EDT)   Pharmacy   [facility] [id number] - [place], [place] - [address] AT [place]

## 2017-02-14 ENCOUNTER — Ambulatory Visit (INDEPENDENT_AMBULATORY_CARE_PROVIDER_SITE_OTHER): Payer: Medicare Other

## 2017-02-14 ENCOUNTER — Ambulatory Visit (INDEPENDENT_AMBULATORY_CARE_PROVIDER_SITE_OTHER): Payer: Medicare Other | Admitting: Physician Assistant

## 2017-02-14 ENCOUNTER — Encounter: Payer: Self-pay | Admitting: Physician Assistant

## 2017-02-14 VITALS — BP 140/80 | HR 89 | Temp 97.5°F | Resp 18 | Ht 66.0 in | Wt 243.2 lb

## 2017-02-14 VITALS — BP 140/80 | HR 89 | Temp 97.5°F | Ht 66.0 in | Wt 243.2 lb

## 2017-02-14 DIAGNOSIS — E785 Hyperlipidemia, unspecified: Secondary | ICD-10-CM

## 2017-02-14 DIAGNOSIS — Z1231 Encounter for screening mammogram for malignant neoplasm of breast: Secondary | ICD-10-CM

## 2017-02-14 DIAGNOSIS — Z23 Encounter for immunization: Secondary | ICD-10-CM

## 2017-02-14 DIAGNOSIS — Z0000 Encounter for general adult medical examination without abnormal findings: Secondary | ICD-10-CM

## 2017-02-14 DIAGNOSIS — I10 Essential (primary) hypertension: Secondary | ICD-10-CM

## 2017-02-14 DIAGNOSIS — Z1239 Encounter for other screening for malignant neoplasm of breast: Secondary | ICD-10-CM

## 2017-02-14 DIAGNOSIS — E039 Hypothyroidism, unspecified: Secondary | ICD-10-CM | POA: Diagnosis not present

## 2017-02-14 MED ORDER — LEVOTHYROXINE SODIUM 50 MCG PO TABS: 25.0000 ug | ORAL_TABLET | Freq: Every day | ORAL | 0 refills | Status: DC

## 2017-02-14 NOTE — Patient Instructions (Signed)
     IF you received an x-ray today, you will receive an invoice from [facility]. Please contact [facility] at [phone] with questions or concerns regarding your invoice.   IF you received labwork today, you will receive an invoice from [facility]. Please contact [facility] at [phone] with questions or concerns regarding your invoice.   Our billing staff will not be able to assist you with questions regarding bills from these companies.  You will be contacted with the lab results as soon as they are available. The fastest way to get your results is to activate your My Chart account. Instructions are located on the last page of this paperwork. If you have not heard from us regarding the results in 2 weeks, please contact this office.     

## 2017-02-14 NOTE — Progress Notes (Signed)
Subjective:   [name] is a 75 y.o. female who presents for an Initial Medicare Annual Wellness Visit.  Review of Systems    N/A  Cardiac Risk Factors include: advanced age (>41men, >29 women);hypertension;dyslipidemia;obesity (BMI >30kg/m2)     Objective:    Today's Vitals   02/14/17 0929 02/14/17 0940  BP: 140/80   Pulse: 89   Temp: (!) 97.5 F (36.4 C)   TempSrc: Oral   SpO2: 91%   Weight: 243 lb 4 oz (110.3 kg)   Height: 5\' 6"  (1.676 m)   PainSc:  9    Body mass index is 39.26 kg/m.   Current Medications (verified) Outpatient Encounter Prescriptions as of 02/14/2017  Medication Sig  . apixaban (ELIQUIS) 5 MG TABS tablet TAKE 1 TABLET(5 MG) BY MOUTH TWICE DAILY  . aspirin 325 MG tablet Take 325 mg by mouth daily.   . Cyanocobalamin (VITAMIN B-12 PO) Take 1 tablet by mouth daily.  . ferrous sulfate 325 (65 FE) MG tablet Take 325 mg by mouth every morning.   . furosemide (LASIX) 40 MG tablet Take 1 tablet (40 mg total) by mouth daily.  Marland Kitchen lisinopril (PRINIVIL,ZESTRIL) 20 MG tablet Take 1 tablet (20 mg total) by mouth daily.  . metoprolol tartrate (LOPRESSOR) 25 MG tablet Take 1 tablet (25 mg total) by mouth 2 (two) times daily.  . pantoprazole (PROTONIX) 40 MG tablet Take 40 mg by mouth daily.   . potassium chloride (K-DUR) 10 MEQ tablet Take 1 tablet (10 mEq total) by mouth daily.  . pramipexole (MIRAPEX) 1 MG tablet Take 1-3 mg by mouth at bedtime.   . traMADol (ULTRAM) 50 MG tablet Take one tablet by mouth every 4 hours as needed for moderate pain; Take two tablets by mouth every 4 hours as needed for severe pain  . venlafaxine (EFFEXOR) 37.5 MG tablet Take 37.5 mg by mouth 2 (two) times daily.  . [DISCONTINUED] polyethylene glycol (MIRALAX / GLYCOLAX) packet Take 17 g by mouth daily.  Marland Kitchen levothyroxine (SYNTHROID, LEVOTHROID) 50 MCG tablet Take 1 tablet (50 mcg total) by mouth daily. (Patient not taking: Reported on 02/14/2017)  . nitroGLYCERIN (NITROSTAT) 0.4 MG  SL tablet Place 1 tablet (0.4 mg total) under the tongue every 5 (five) minutes as needed for chest pain.   No facility-administered encounter medications on file as of 02/14/2017.     Allergies (verified) Other; Sulfamethoxazole-trimethoprim; Cortisone; Latex; and Tape   History: Past Medical History:  Diagnosis Date  . Anxiety   . Coronary artery disease    a. s/p MI 2009 - PCI/DES distal  RCA w/ 2.75 x 18 Xience DES;  b. 05/2010 Myoview: Apical thinning, EF 73%;  c. 06/2010 Cath: moderate nonobs dzs, EF 65%;  d.  Lexiscan Myoview (07/2013):  No scar or ischemia, EF 67%; Normal Study  . Environmental allergies   . Essential iris atrophy    Right side  . Fatigue   . Fibromyalgia   . GERD (gastroesophageal reflux disease)   . Glaucoma   . Hyperlipidemia   . Hypertension   . Menopause   . Obesity (BMI 30-39.9)   . OSA (obstructive sleep apnea)    does not wear CPAP  . Osteoarthritis   . Oxygen dependent    3L   Past Surgical History:  Procedure Laterality Date  . CARDIAC CATHETERIZATION  06/29/2010   Mild to moderate coronary artery irregularities.  Her  proximal left anterior descending artery is moderately narrowed.  She  also  has an eccentric stenosis in the proximal LAD.  These do not appear  to obstruct flow at present, but they are fairly small vessels.  They  appeared to be between 1.5 and 2 mm in diamete  . CARDIAC CATHETERIZATION  08/02/2009   Patent stent  . CARDIAC CATHETERIZATION  05/12/2008   Stent to the distal RCA  . CARDIAC CATHETERIZATION N/A 09/20/2015   Procedure: Left Heart Cath and Coronary Angiography;  Surgeon: Jettie Booze, MD;  Location: [facility];  Service: Cardiovascular;  Laterality: N/A;  . CARDIAC CATHETERIZATION  09/20/2015   Procedure: Intravascular Ultrasound/IVUS;  Surgeon: Jettie Booze, MD;  Location: [facility];  Service: Cardiovascular;;  . CARDIAC CATHETERIZATION  09/20/2015   Procedure: Intravascular Pressure Wire/FFR  Study;  Surgeon: Jettie Booze, MD;  Location: [facility];  Service: Cardiovascular;;  . CLIPPING OF ATRIAL APPENDAGE N/A 09/23/2015   Procedure:  CLIPPING OF ATRIAL APPENDAGE;  Surgeon: Grace Isaac, MD;  Location: [facility];  Service: Open Heart Surgery;  Laterality: N/A;  . CORONARY ARTERY BYPASS GRAFT N/A 09/23/2015   Procedure: CORONARY ARTERY BYPASS GRAFTING (CABG) TIMES 1 USING LEFT INTERNAL MAMMARY TO THE LAD;  Surgeon: Grace Isaac, MD;  Location: [facility];  Service: Open Heart Surgery;  Laterality: N/A;  . MAZE N/A 09/23/2015   Procedure: MAZE;  Surgeon: Grace Isaac, MD;  Location: [facility];  Service: Open Heart Surgery;  Laterality: N/A;  . RESECTION OF ARTERIOVENOUS FISTULA ANEURYSM Right 10/21/2015   Procedure: SUTURE REPAIR OF RADIAL ARTERY AND EVACUATION OF HEMATOMA;  Surgeon: Mal Misty, MD;  Location: [place] [facility];  Service: Vascular;  Laterality: Right;  . TEE WITHOUT CARDIOVERSION N/A 09/23/2015   Procedure: TRANSESOPHAGEAL ECHOCARDIOGRAM (TEE);  Surgeon: Grace Isaac, MD;  Location: [facility];  Service: Open Heart Surgery;  Laterality: N/A;   Family History  Problem Relation Age of Onset  . Heart attack Mother   . Cancer Father   . Coronary artery disease Brother        had CABG  . Coronary artery disease Brother   . Coronary artery disease Brother   . Coronary artery disease Brother   . Coronary artery disease Brother   . Heart attack Brother    Social History   Occupational History  . Not on file.   Social History Main Topics  . Smoking status: Former Smoker    Types: Cigarettes    Quit date: 09/13/1960  . Smokeless tobacco: Never Used  . Alcohol use No  . Drug use: No  . Sexual activity: Not on file    Tobacco Counseling Counseling given: Not Answered   Activities of Daily Living In your present state of health, do you have any difficulty performing the following activities: 02/14/2017 04/26/2016  Hearing? N N  Vision? N N  Difficulty  concentrating or making decisions? Y N  Comment forgets words she is suppose to say at times -  Walking or climbing stairs? Y Y  Comment Patient has knee and back pain climbing stairs  Dressing or bathing? N N  Doing errands, shopping? N Y  Comment - no Counsellor and eating ? N -  Using the Toilet? N -  In the past six months, have you accidently leaked urine? Y -  Comment Can't hold urine and has to go immediately -  Do you have problems with loss of bowel control? N -  Managing your Medications? N -  Managing your Finances? N -  Housekeeping or managing your Housekeeping? N -  Some recent data might be hidden    Immunizations and Health Maintenance Immunization History  Administered Date(s) Administered  . Influenza,inj,Quad PF,6+ Mos 04/27/2016, 02/14/2017  . PPD Test 10/31/2015  . Pneumococcal Conjugate-13 09/30/2013  . Pneumococcal Polysaccharide-23 04/27/2016  . Tdap 04/03/2012   Health Maintenance Due  Topic Date Due  . MAMMOGRAM  12/23/2015    Patient Care Team: Hillis Range as PCP - General (Urgent Care) Nahser, Wonda Cheng, MD as Consulting Physician (Cardiology) Shon Hough, MD as Consulting Physician (Ophthalmology)  Indicate any recent Medical Services you may have received from other than [facility] providers in the past year (date may be approximate).     Assessment:   This is a routine wellness examination for [name].   Hearing/Vision screen Vision Screening Comments: Patient sees Dr. Kathrin Penner for her regular eye exams every 6 months.   Dietary issues and exercise activities discussed: Current Exercise Habits: The patient does not participate in regular exercise at present, Exercise limited by: Other - see comments (Patient has chronic fatigue syndrome and arthritis)  Goals    . Exercise 3x per week (30 min per time)          Patient wants to try to start walking to help increase her exercise and lose weight.      Depression  Screen PHQ 2/9 Scores 02/14/2017 01/23/2017 01/23/2017 09/25/2016 08/16/2016 08/09/2016 07/19/2015  PHQ - 2 Score 1 3 0 0 0 0 0  PHQ- 9 Score 11 11 - - - - -    Fall Risk Fall Risk  02/14/2017 01/23/2017 09/25/2016 08/16/2016 08/09/2016  Falls in the past year? No No No No No    Cognitive Function:     6CIT Screen 02/14/2017  What Year? 0 points  What month? 0 points  What time? 0 points  Count back from 20 0 points  Months in reverse 0 points  Repeat phrase 0 points  Total Score 0    Screening Tests Health Maintenance  Topic Date Due  . MAMMOGRAM  12/23/2015  . COLONOSCOPY  08/11/2017  . TETANUS/TDAP  04/03/2022  . INFLUENZA VACCINE  Completed  . DEXA SCAN  Completed  . PNA vac Low Risk Adult  Completed      Plan:   I have personally reviewed and noted the following in the patient's chart:   . Medical and social history . Use of alcohol, tobacco or illicit drugs  . Current medications and supplements . Functional ability and status . Nutritional status . Physical activity . Advanced directives . List of other physicians . Hospitalizations, surgeries, and ER visits in previous 12 months . Vitals . Screenings to include cognitive, depression, and falls . Referrals and appointments  In addition, I have reviewed and discussed with patient certain preventive protocols, quality metrics, and best practice recommendations. A written personalized care plan for preventive services as well as general preventive health recommendations were provided to patient.  Bloodwork ordered Mammogram ordered.    Andrez Grime, LPN   [id number]

## 2017-02-14 NOTE — Patient Instructions (Addendum)
[name] , Thank you for taking time to come for your Medicare Wellness Visit. I appreciate your ongoing commitment to your health goals. Please review the following plan we discussed and let me know if I can assist you in the future.   Screening recommendations/referrals: Colonoscopy: up to date, next due 08/11/2017 Mammogram: due, [facility] will call you about scheduling Bone Density: up to date, next due 09/02/2017 Recommended yearly ophthalmology/optometry visit for glaucoma screening and checkup Recommended yearly dental visit for hygiene and checkup  Vaccinations: Influenza vaccine: administered today  Pneumococcal vaccine: up to date Tdap vaccine: up to date, next due 04/03/2022 Shingles vaccine:  Check with your pharmacy about receiving the new vaccine  Advanced directives: Advance directive discussed with you today. I have provided a copy for you to complete at home and have notarized. Once this is complete please bring a copy in to our office so we can scan it into your chart.   Conditions/risks identified: Try to start walking to help increase your exercise and hopefully will help you to start losing weight.  Next appointment: 02/14/17 @ 2 pm with Philis Fendt    Preventive Care 65 Years and Older, Female Preventive care refers to lifestyle choices and visits with your health care provider that can promote health and wellness. What does preventive care include?  A yearly physical exam. This is also called an annual well check.  Dental exams once or twice a year.  Routine eye exams. Ask your health care provider how often you should have your eyes checked.  Personal lifestyle choices, including:  Daily care of your teeth and gums.  Regular physical activity.  Eating a healthy diet.  Avoiding tobacco and drug use.  Limiting alcohol use.  Practicing safe sex.  Taking low-dose aspirin every day.  Taking vitamin and mineral supplements as recommended by your  health care provider. What happens during an annual well check? The services and screenings done by your health care provider during your annual well check will depend on your age, overall health, lifestyle risk factors, and family history of disease. Counseling  Your health care provider may ask you questions about your:  Alcohol use.  Tobacco use.  Drug use.  Emotional well-being.  Home and relationship well-being.  Sexual activity.  Eating habits.  History of falls.  Memory and ability to understand (cognition).  Work and work Statistician.  Reproductive health. Screening  You may have the following tests or measurements:  Height, weight, and BMI.  Blood pressure.  Lipid and cholesterol levels. These may be checked every 5 years, or more frequently if you are over 63 years old.  Skin check.  Lung cancer screening. You may have this screening every year starting at age 49 if you have a 30-pack-year history of smoking and currently smoke or have quit within the past 15 years.  Fecal occult blood test (FOBT) of the stool. You may have this test every year starting at age 68.  Flexible sigmoidoscopy or colonoscopy. You may have a sigmoidoscopy every 5 years or a colonoscopy every 10 years starting at age 76.  Hepatitis C blood test.  Hepatitis B blood test.  Sexually transmitted disease (STD) testing.  Diabetes screening. This is done by checking your blood sugar (glucose) after you have not eaten for a while (fasting). You may have this done every 1-3 years.  Bone density scan. This is done to screen for osteoporosis. You may have this done starting at age 69.  Mammogram. This may be done every 1-2 years. Talk to your health care provider about how often you should have regular mammograms. Talk with your health care provider about your test results, treatment options, and if necessary, the need for more tests. Vaccines  Your health care provider may recommend  certain vaccines, such as:  Influenza vaccine. This is recommended every year.  Tetanus, diphtheria, and acellular pertussis (Tdap, Td) vaccine. You may need a Td booster every 10 years.  Zoster vaccine. You may need this after age 102.  Pneumococcal 13-valent conjugate (PCV13) vaccine. One dose is recommended after age 64.  Pneumococcal polysaccharide (PPSV23) vaccine. One dose is recommended after age 11. Talk to your health care provider about which screenings and vaccines you need and how often you need them. This information is not intended to replace advice given to you by your health care provider. Make sure you discuss any questions you have with your health care provider. Document Released: 06/03/2015 Document Revised: 01/25/2016 Document Reviewed: 03/08/2015 Elsevier Interactive Patient Education  2017 [place] Prevention in the Home Falls can cause injuries. They can happen to people of all ages. There are many things you can do to make your home safe and to help prevent falls. What can I do on the outside of my home?  Regularly fix the edges of walkways and driveways and fix any cracks.  Remove anything that might make you trip as you walk through a door, such as a raised step or threshold.  Trim any bushes or trees on the path to your home.  Use bright outdoor lighting.  Clear any walking paths of anything that might make someone trip, such as rocks or tools.  Regularly check to see if handrails are loose or broken. Make sure that both sides of any steps have handrails.  Any raised decks and porches should have guardrails on the edges.  Have any leaves, snow, or ice cleared regularly.  Use sand or salt on walking paths during winter.  Clean up any spills in your garage right away. This includes oil or grease spills. What can I do in the bathroom?  Use night lights.  Install grab bars by the toilet and in the tub and shower. Do not use towel bars as  grab bars.  Use non-skid mats or decals in the tub or shower.  If you need to sit down in the shower, use a plastic, non-slip stool.  Keep the floor dry. Clean up any water that spills on the floor as soon as it happens.  Remove soap buildup in the tub or shower regularly.  Attach bath mats securely with double-sided non-slip rug tape.  Do not have throw rugs and other things on the floor that can make you trip. What can I do in the bedroom?  Use night lights.  Make sure that you have a light by your bed that is easy to reach.  Do not use any sheets or blankets that are too big for your bed. They should not hang down onto the floor.  Have a firm chair that has side arms. You can use this for support while you get dressed.  Do not have throw rugs and other things on the floor that can make you trip. What can I do in the kitchen?  Clean up any spills right away.  Avoid walking on wet floors.  Keep items that you use a lot in easy-to-reach places.  If you need to reach  something above you, use a strong step stool that has a grab bar.  Keep electrical cords out of the way.  Do not use floor polish or wax that makes floors slippery. If you must use wax, use non-skid floor wax.  Do not have throw rugs and other things on the floor that can make you trip. What can I do with my stairs?  Do not leave any items on the stairs.  Make sure that there are handrails on both sides of the stairs and use them. Fix handrails that are broken or loose. Make sure that handrails are as long as the stairways.  Check any carpeting to make sure that it is firmly attached to the stairs. Fix any carpet that is loose or worn.  Avoid having throw rugs at the top or bottom of the stairs. If you do have throw rugs, attach them to the floor with carpet tape.  Make sure that you have a light switch at the top of the stairs and the bottom of the stairs. If you do not have them, ask someone to add them  for you. What else can I do to help prevent falls?  Wear shoes that:  Do not have high heels.  Have rubber bottoms.  Are comfortable and fit you well.  Are closed at the toe. Do not wear sandals.  If you use a stepladder:  Make sure that it is fully opened. Do not climb a closed stepladder.  Make sure that both sides of the stepladder are locked into place.  Ask someone to hold it for you, if possible.  Clearly mark and make sure that you can see:  Any grab bars or handrails.  First and last steps.  Where the edge of each step is.  Use tools that help you move around (mobility aids) if they are needed. These include:  Canes.  Walkers.  Scooters.  Crutches.  Turn on the lights when you go into a dark area. Replace any light bulbs as soon as they burn out.  Set up your furniture so you have a clear path. Avoid moving your furniture around.  If any of your floors are uneven, fix them.  If there are any pets around you, be aware of where they are.  Review your medicines with your doctor. Some medicines can make you feel dizzy. This can increase your chance of falling. Ask your doctor what other things that you can do to help prevent falls. This information is not intended to replace advice given to you by your health care provider. Make sure you discuss any questions you have with your health care provider. Document Released: 03/03/2009 Document Revised: 10/13/2015 Document Reviewed: 06/11/2014 Elsevier Interactive Patient Education  2017 [employer].

## 2017-02-14 NOTE — Progress Notes (Signed)
02/14/2017 2:52 PM   DOB: 1940-07-10 / MRN: [id number]  SUBJECTIVE:  [name] is a 76 y.o. female presenting for discuss her thyroid medication. She is not sure if she is hyper or hypothyroid at this point and tells me her previous provider told her she was hyperthyroid.    She is continuing to look for a place to live and is on a waiting list for an apartment complex for seniors. 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   She plans to go see her daughter in about a week.   With regard to sleep apnea she will be seeing Dr. Ron Parker.    [facility]   She is allergic to other; sulfamethoxazole-trimethoprim; cortisone; latex; and tape.   She  has a past medical history of Anxiety; Coronary artery disease; Environmental allergies; Essential iris atrophy; Fatigue; Fibromyalgia; GERD (gastroesophageal reflux disease); Glaucoma; Hyperlipidemia; Hypertension; Menopause; Obesity (BMI 30-39.9); OSA (obstructive sleep apnea); Osteoarthritis; and Oxygen dependent.    She  reports that she quit smoking about 56 years ago. Her smoking use included Cigarettes. She has never used smokeless tobacco. She reports that she does not drink alcohol or use drugs. She  has no sexual activity history on file. 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  Her family history includes Cancer in her father; Coronary artery disease in her brother, brother, brother, brother, and brother; Heart attack in her brother and mother.  Review of  Systems  Constitutional: Negative for chills, diaphoresis and fever.  Eyes: Negative.   Respiratory: Negative for shortness of breath.   Cardiovascular: Negative for chest pain, orthopnea and leg swelling.  Gastrointestinal: Negative for nausea.  Skin: Negative for rash.  Neurological: Negative for dizziness, sensory change, speech change, focal weakness and headaches.    The problem list and medications were reviewed and updated by myself where necessary and exist elsewhere in the encounter.   OBJECTIVE:  BP 140/80 (BP Location: Right Arm, Patient Position: Sitting, Cuff Size: Large)   Pulse 89   Temp (!) 97.5 F (36.4 C) (Oral)   Resp 18   Ht 5\' 6"  (1.676 m)   Wt 243 lb 3.2 oz (110.3 kg)   SpO2 97%   BMI 39.25 kg/m   Physical Exam  Constitutional: She is oriented to person, place, and time. She is active.  Cardiovascular: Normal rate and regular rhythm.   Pulmonary/Chest: Effort normal and breath sounds normal. No tachypnea.  Neurological: She is alert and oriented to person, place, and time.  Skin: Skin is warm.  Psychiatric: She has a normal mood and affect. Her behavior is normal. Judgment and thought content normal.    No results found for this or any previous visit (from the past 72 hour(s)).  No results found.  ASSESSMENT AND PLAN:  [name] was seen today for follow-up.  Diagnoses and all orders for this visit:  Hypothyroidism, unspecified type: Free T4 and TSH both pointing towards hypothyroidism.  Will start her on 25 mcg and see her back in 6 weeks or so.     The patient is advised to call or return to clinic if she does not see an improvement  in symptoms, or to seek the care of the closest emergency department if she worsens with the above plan.   Philis Fendt, MHS, PA-C [facility] 02/14/2017 2:52 PM

## 2017-02-15 LAB — COMPREHENSIVE METABOLIC PANEL
ALK PHOS: 111 IU/L (ref 39–117)
ALT: 16 IU/L (ref 0–32)
AST: 31 IU/L (ref 0–40)
Albumin/Globulin Ratio: 1.1 — ABNORMAL LOW (ref 1.2–2.2)
Albumin: 4 g/dL (ref 3.5–4.8)
BILIRUBIN TOTAL: 0.5 mg/dL (ref 0.0–1.2)
BUN / CREAT RATIO: 13 (ref 12–28)
BUN: 15 mg/dL (ref 8–27)
CHLORIDE: 101 mmol/L (ref 96–106)
CO2: 24 mmol/L (ref 20–29)
CREATININE: 1.12 mg/dL — AB (ref 0.57–1.00)
Calcium: 9.8 mg/dL (ref 8.7–10.3)
GFR calc Af Amer: 56 mL/min/{1.73_m2} — ABNORMAL LOW (ref >59)
GFR calc non Af Amer: 49 mL/min/{1.73_m2} — ABNORMAL LOW (ref >59)
GLUCOSE: 91 mg/dL (ref 65–99)
Globulin, Total: 3.6 g/dL (ref 1.5–4.5)
Potassium: 4.6 mmol/L (ref 3.5–5.2)
SODIUM: 141 mmol/L (ref 134–144)
Total Protein: 7.6 g/dL (ref 6.0–8.5)

## 2017-02-15 LAB — LIPID PANEL
Chol/HDL Ratio: 3.8 ratio (ref 0.0–4.4)
Cholesterol, Total: 235 mg/dL — ABNORMAL HIGH (ref 100–199)
HDL: 62 mg/dL (ref >39)
LDL CALC: 160 mg/dL — AB (ref 0–99)
Triglycerides: 67 mg/dL (ref 0–149)
VLDL CHOLESTEROL CAL: 13 mg/dL (ref 5–40)

## 2017-02-16 ENCOUNTER — Encounter (HOSPITAL_COMMUNITY): Payer: Self-pay | Admitting: Emergency Medicine

## 2017-02-16 ENCOUNTER — Emergency Department (HOSPITAL_COMMUNITY): Payer: Medicare Other

## 2017-02-16 ENCOUNTER — Emergency Department (HOSPITAL_COMMUNITY)
Admission: EM | Admit: 2017-02-16 | Discharge: 2017-02-16 | Disposition: A | Discharge status: Home / Self Care | Payer: Medicare Other | Attending: Emergency Medicine | Admitting: Emergency Medicine

## 2017-02-16 DIAGNOSIS — I10 Essential (primary) hypertension: Secondary | ICD-10-CM | POA: Insufficient documentation

## 2017-02-16 DIAGNOSIS — Z79899 Other long term (current) drug therapy: Secondary | ICD-10-CM | POA: Insufficient documentation

## 2017-02-16 DIAGNOSIS — F419 Anxiety disorder, unspecified: Secondary | ICD-10-CM | POA: Insufficient documentation

## 2017-02-16 DIAGNOSIS — I4891 Unspecified atrial fibrillation: Secondary | ICD-10-CM | POA: Diagnosis not present

## 2017-02-16 DIAGNOSIS — R002 Palpitations: Secondary | ICD-10-CM | POA: Diagnosis present

## 2017-02-16 DIAGNOSIS — Z87891 Personal history of nicotine dependence: Secondary | ICD-10-CM | POA: Diagnosis not present

## 2017-02-16 DIAGNOSIS — Z7982 Long term (current) use of aspirin: Secondary | ICD-10-CM | POA: Insufficient documentation

## 2017-02-16 DIAGNOSIS — Z91040 Latex allergy status: Secondary | ICD-10-CM | POA: Diagnosis not present

## 2017-02-16 DIAGNOSIS — Z951 Presence of aortocoronary bypass graft: Secondary | ICD-10-CM | POA: Diagnosis not present

## 2017-02-16 DIAGNOSIS — F329 Major depressive disorder, single episode, unspecified: Secondary | ICD-10-CM | POA: Insufficient documentation

## 2017-02-16 DIAGNOSIS — I252 Old myocardial infarction: Secondary | ICD-10-CM | POA: Insufficient documentation

## 2017-02-16 DIAGNOSIS — I2510 Atherosclerotic heart disease of native coronary artery without angina pectoris: Secondary | ICD-10-CM | POA: Diagnosis not present

## 2017-02-16 LAB — CBC
HCT: 36.8 % (ref 36.0–46.0)
Hemoglobin: 12 g/dL (ref 12.0–15.0)
MCH: 30.1 pg (ref 26.0–34.0)
MCHC: 32.6 g/dL (ref 30.0–36.0)
MCV: 92.2 fL (ref 78.0–100.0)
PLATELETS: 171 10*3/uL (ref 150–400)
RBC: 3.99 MIL/uL (ref 3.87–5.11)
RDW: 14.6 % (ref 11.5–15.5)
WBC: 6.4 10*3/uL (ref 4.0–10.5)

## 2017-02-16 LAB — BASIC METABOLIC PANEL
Anion gap: 6 (ref 5–15)
BUN: 14 mg/dL (ref 6–20)
CALCIUM: 9.5 mg/dL (ref 8.9–10.3)
CO2: 27 mmol/L (ref 22–32)
CREATININE: 1.01 mg/dL — AB (ref 0.44–1.00)
Chloride: 106 mmol/L (ref 101–111)
GFR, EST NON AFRICAN AMERICAN: 53 mL/min — AB (ref >60)
Glucose, Bld: 103 mg/dL — ABNORMAL HIGH (ref 65–99)
Potassium: 4.3 mmol/L (ref 3.5–5.1)
SODIUM: 139 mmol/L (ref 135–145)

## 2017-02-16 LAB — TSH: TSH: 14.605 u[IU]/mL — ABNORMAL HIGH (ref 0.350–4.500)

## 2017-02-16 LAB — I-STAT TROPONIN, ED: TROPONIN I, POC: 0.02 ng/mL (ref 0.00–0.08)

## 2017-02-16 NOTE — ED Triage Notes (Signed)
Pt in from home via [facility] with c/o central cp and now-resolved afib. Hx of afib, pt states she was doing daily tasks this morning, began having sob and felt like she was having "afib episode". Pt was hypertensive on EMS arrival, BP was 225/118 and HR was afib in 180's. With vagal maneuvers, pt converted to NSR. HR now 92. Given 324 ASA and 4 NTG PTA. Pain went from 5/10 to unremarkable post-NTG

## 2017-02-16 NOTE — ED Provider Notes (Signed)
[facility] DEPT Provider Note   CSN: [id number] Arrival date & time: 02/16/17  1052     History   Chief Complaint Chief Complaint  Patient presents with  . Chest Pain  . Atrial Fibrillation    HPI [name] is a 76 y.o. female.  HPI Patient is a 76 year old female the history of paroxysmal A. fib on chronic anticoagulation.  She presents the emergency department acute onset of palpitations and chest tightness.  This resolved after paramedics reportedly did vagal maneuvers.  Patient however reports metoprolol was given.  Also given aspirin and nitroglycerin placed on her chest.  No chest tightness now.  No recent change in medications.  She does have hypothyroidism and is on Synthroid.  No recent fevers or chills.  Denies excessive caffeine.  History of CABG.  Completely asymptomatic at this time   Past Medical History:  Diagnosis Date  . Anxiety   . Coronary artery disease    a. s/p MI 2009 - PCI/DES distal  RCA w/ 2.75 x 18 Xience DES;  b. 05/2010 Myoview: Apical thinning, EF 73%;  c. 06/2010 Cath: moderate nonobs dzs, EF 65%;  d.  Lexiscan Myoview (07/2013):  No scar or ischemia, EF 67%; Normal Study  . Environmental allergies   . Essential iris atrophy    Right side  . Fatigue   . Fibromyalgia   . GERD (gastroesophageal reflux disease)   . Glaucoma   . Hyperlipidemia   . Hypertension   . Menopause   . Obesity (BMI 30-39.9)   . OSA (obstructive sleep apnea)    does not wear CPAP  . Osteoarthritis   . Oxygen dependent    3L    Patient Active Problem List   Diagnosis Date Noted  . Right arm pain 09/26/2016  . Acute pain of right knee 09/25/2016  . Acute bronchitis 04/29/2016  . Depression 04/29/2016  . Chronic fatigue syndrome 04/29/2016  . Dyspnea 04/27/2016  . Exertional dyspnea 04/25/2016  . 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 Pseudoaneurysm ([place]) 10/18/2015  . Anxiety state 10/03/2015  . CAD (coronary artery disease) 09/23/2015  .  Atrial fibrillation ([place]) 12/19/2014  . Multiple thyroid nodules 01/12/2014  . OSA (obstructive sleep apnea)   . Obesity (BMI 30-39.9)   . Sleep apnea 06/22/2013  . Fibromyalgia   . Coronary artery disease   . Hypertension   . Hyperlipidemia   . Fatigue   . History of myocardial infarction     Past Surgical History:  Procedure Laterality Date  . CARDIAC CATHETERIZATION  06/29/2010   Mild to moderate coronary artery irregularities.  Her  proximal left anterior descending artery is moderately narrowed.  She  also has an eccentric stenosis in the proximal LAD.  These do not appear  to obstruct flow at present, but they are fairly small vessels.  They  appeared to be between 1.5 and 2 mm in diamete  . CARDIAC CATHETERIZATION  08/02/2009   Patent stent  . CARDIAC CATHETERIZATION  05/12/2008   Stent to the distal RCA  . CARDIAC CATHETERIZATION N/A 09/20/2015   Procedure: Left Heart Cath and Coronary Angiography;  Surgeon: Jettie Booze, MD;  Location: [facility];  Service: Cardiovascular;  Laterality: N/A;  . CARDIAC CATHETERIZATION  09/20/2015   Procedure: Intravascular Ultrasound/IVUS;  Surgeon: Jettie Booze, MD;  Location: [facility];  Service: Cardiovascular;;  . CARDIAC CATHETERIZATION  09/20/2015   Procedure: Intravascular Pressure Wire/FFR Study;  Surgeon: Conception Oms  Hassell Done, MD;  Location: [facility];  Service: Cardiovascular;;  . CLIPPING OF ATRIAL APPENDAGE N/A 09/23/2015   Procedure:  CLIPPING OF ATRIAL APPENDAGE;  Surgeon: Grace Isaac, MD;  Location: [facility];  Service: Open Heart Surgery;  Laterality: N/A;  . CORONARY ARTERY BYPASS GRAFT N/A 09/23/2015   Procedure: CORONARY ARTERY BYPASS GRAFTING (CABG) TIMES 1 USING LEFT INTERNAL MAMMARY TO THE LAD;  Surgeon: Grace Isaac, MD;  Location: [facility];  Service: Open Heart Surgery;  Laterality: N/A;  . MAZE N/A 09/23/2015   Procedure: MAZE;  Surgeon: Grace Isaac, MD;  Location: [facility];  Service:  Open Heart Surgery;  Laterality: N/A;  . RESECTION OF ARTERIOVENOUS FISTULA ANEURYSM Right 10/21/2015   Procedure: SUTURE REPAIR OF RADIAL ARTERY AND EVACUATION OF HEMATOMA;  Surgeon: Mal Misty, MD;  Location: [facility];  Service: Vascular;  Laterality: Right;  . TEE WITHOUT CARDIOVERSION N/A 09/23/2015   Procedure: TRANSESOPHAGEAL ECHOCARDIOGRAM (TEE);  Surgeon: Grace Isaac, MD;  Location: [facility];  Service: Open Heart Surgery;  Laterality: N/A;    OB History    No data available       Home Medications    Prior to Admission medications   Medication Sig Start Date End Date Taking? Authorizing Provider  apixaban (ELIQUIS) 5 MG TABS tablet TAKE 1 TABLET(5 MG) BY MOUTH TWICE DAILY 02/11/17   Nahser, Wonda Cheng, MD  aspirin 325 MG tablet Take 325 mg by mouth daily.     [provider]  Cyanocobalamin (VITAMIN B-12 PO) Take 1 tablet by mouth daily.    [provider]  ferrous sulfate 325 (65 FE) MG tablet Take 325 mg by mouth every morning.     [provider]  furosemide (LASIX) 40 MG tablet Take 1 tablet (40 mg total) by mouth daily. 03/07/16   Nahser, Wonda Cheng, MD  levothyroxine (SYNTHROID, LEVOTHROID) 50 MCG tablet Take 0.5 tablets (25 mcg total) by mouth daily. 02/14/17   Tereasa Coop, PA-C  lisinopril (PRINIVIL,ZESTRIL) 20 MG tablet Take 1 tablet (20 mg total) by mouth daily. 03/07/16   Nahser, Wonda Cheng, MD  metoprolol tartrate (LOPRESSOR) 25 MG tablet Take 1 tablet (25 mg total) by mouth 2 (two) times daily. 02/11/17   Nahser, Wonda Cheng, MD  nitroGLYCERIN (NITROSTAT) 0.4 MG SL tablet Place 1 tablet (0.4 mg total) under the tongue every 5 (five) minutes as needed for chest pain. 02/23/16 11/05/16  Nahser, Wonda Cheng, MD  pantoprazole (PROTONIX) 40 MG tablet Take 40 mg by mouth daily.  01/07/14   [provider]  potassium chloride (K-DUR) 10 MEQ tablet Take 1 tablet (10 mEq total) by mouth daily. 03/07/16   Nahser, Wonda Cheng, MD  pramipexole (MIRAPEX) 1 MG  tablet Take 1-3 mg by mouth at bedtime.     [provider]  traMADol (ULTRAM) 50 MG tablet Take one tablet by mouth every 4 hours as needed for moderate pain; Take two tablets by mouth every 4 hours as needed for severe pain 10/06/15   Reed, Tiffany L, DO  venlafaxine (EFFEXOR) 37.5 MG tablet Take 37.5 mg by mouth 2 (two) times daily.    [provider]    Family History Family History  Problem Relation Age of Onset  . Heart attack Mother   . Cancer Father   . Coronary artery disease Brother        had CABG  . Coronary artery disease Brother   . Coronary artery disease  Brother   . Coronary artery disease Brother   . Coronary artery disease Brother   . Heart attack Brother     Social History Social History  Substance Use Topics  . Smoking status: Former Smoker    Types: Cigarettes    Quit date: 09/13/1960  . Smokeless tobacco: Never Used  . Alcohol use No     Allergies   Other; Sulfamethoxazole-trimethoprim; Cortisone; Latex; and Tape   Review of Systems Review of Systems  All other systems reviewed and are negative.    Physical Exam Updated Vital Signs BP (!) 169/95   Pulse 84   Resp 16   Ht 5\' 6"  (1.676 m)   Wt 110.2 kg (243 lb)   SpO2 97%   BMI 39.22 kg/m   Physical Exam  Constitutional: She is oriented to person, place, and time. She appears well-developed and well-nourished. No distress.  HENT:  Head: Normocephalic and atraumatic.  Eyes: EOM are normal.  Neck: Normal range of motion.  Cardiovascular: Normal rate, regular rhythm and normal heart sounds.   Pulmonary/Chest: Effort normal and breath sounds normal.  Abdominal: Soft. She exhibits no distension. There is no tenderness.  Musculoskeletal: Normal range of motion.  Neurological: She is alert and oriented to person, place, and time.  Skin: Skin is warm and dry.  Psychiatric: She has a normal mood and affect. Judgment normal.  Nursing note and vitals reviewed.    ED  Treatments / Results  Labs (all labs ordered are listed, but only abnormal results are displayed) Labs Reviewed  BASIC METABOLIC PANEL - Abnormal; Notable for the following:       Result Value   Glucose, Bld 103 (*)    Creatinine, Ser 1.01 (*)    GFR calc non Af Amer 53 (*)    All other components within normal limits  CBC  TSH  I-STAT TROPONIN, ED    EKG  EKG Interpretation  Date/Time:  Saturday February 16 2017 11:10:59 EDT Ventricular Rate:  85 PR Interval:    QRS Duration: 86 QT Interval:  382 QTC Calculation: 455 R Axis:   89 Text Interpretation:  Sinus rhythm Borderline right axis deviation Minimal ST depression, lateral leads No significant change was found Confirmed by Jola Schmidt [phone]) on 02/16/2017 12:53:25 PM       Radiology Dg Chest 2 View  Result Date: 02/16/2017 CLINICAL DATA:  Chest pain EXAM: CHEST  2 VIEW COMPARISON:  08/09/2016 FINDINGS: Borderline cardiomegaly. Diffuse interstitial coarsening that is chronic based on priors. Status post median sternotomy with history of CABG and left atrial clipping. There is no edema, consolidation, effusion, or pneumothorax. IMPRESSION: No acute finding when compared to prior. Electronically Signed   By: Monte Fantasia M.D.   On: 02/16/2017 11:41    Procedures Procedures (including critical care time)  Medications Ordered in ED Medications - No data to display   Initial Impression / Assessment and Plan / ED Course  I have reviewed the triage vital signs and the nursing notes.  Pertinent labs & imaging results that were available during my care of the patient were reviewed by me and considered in my medical decision making (see chart for details).     12:54 PM Patient remains in normal sinus rhythm at this time.  She is without symptoms.  She is artery on chronic anticoagulation.  Patient be discharged home to follow-up with her primary care doctor and her cardiologist.  She understands return to the ER for  new or  worsening symptoms    CHA2DS2/VAS Stroke Risk Points      5 >= 2 Points: High Risk  1 - 1.99 Points: Medium Risk  0 Points: Low Risk    The patient's score has not changed in the past year.:  No Change         Details    Note: External data might be a factor in metrics not marked with    Points Metrics   This score determines the patient's risk of having a stroke if the  patient has atrial fibrillation.       1 Has Congestive Heart Failure:  Yes   1 Has Vascular Disease:  Yes   1 Has Hypertension:  Yes   1 Age:  40   0 Has Diabetes:  No   0 Had Stroke:  No Had TIA:  No Had thromboembolism:  No   1 Female:  Yes          Final Clinical Impressions(s) / ED Diagnoses   Final diagnoses:  Atrial fibrillation with rapid ventricular response [place])    New Prescriptions New Prescriptions   No medications on file     Jola Schmidt, MD 02/16/17 1254

## 2017-02-19 ENCOUNTER — Telehealth (HOSPITAL_COMMUNITY): Payer: Self-pay | Admitting: *Deleted

## 2017-02-19 NOTE — Telephone Encounter (Signed)
No vcml set up.  Pt on ED afib f/u report.  Cld to offer appt for f/u

## 2017-02-21 ENCOUNTER — Telehealth: Payer: Self-pay | Admitting: Physician Assistant

## 2017-02-21 NOTE — Telephone Encounter (Signed)
She can stop the medication.  Keep future follow up.  If any symptoms persist after medication cessation then RTC. Philis Fendt, MS, PA-C 2:16 PM, 02/21/2017

## 2017-02-21 NOTE — Telephone Encounter (Signed)
Pt states the medication levothyroxine (SYNTHROID, LEVOTHROID) is making her sick (nausea and dizziness)  Please adv

## 2017-02-21 NOTE — Telephone Encounter (Signed)
ERROR

## 2017-02-22 ENCOUNTER — Other Ambulatory Visit: Payer: Self-pay | Admitting: Physician Assistant

## 2017-02-22 DIAGNOSIS — Z1231 Encounter for screening mammogram for malignant neoplasm of breast: Secondary | ICD-10-CM

## 2017-02-22 NOTE — Telephone Encounter (Signed)
Pt called back returning the missed call\  Please advise

## 2017-02-22 NOTE — Telephone Encounter (Signed)
Attempted call- VM not set up.  

## 2017-02-25 NOTE — Telephone Encounter (Signed)
Pt states she is now feeling better with taking synthroid. States effexor was causing her issues.

## 2017-03-18 ENCOUNTER — Encounter: Payer: Self-pay | Admitting: Nurse Practitioner

## 2017-03-18 ENCOUNTER — Ambulatory Visit
Admission: RE | Admit: 2017-03-18 | Discharge: 2017-03-18 | Disposition: A | Discharge status: Home / Self Care | Payer: Medicare Other | Source: Ambulatory Visit | Attending: Physician Assistant | Admitting: Physician Assistant

## 2017-03-18 ENCOUNTER — Ambulatory Visit (INDEPENDENT_AMBULATORY_CARE_PROVIDER_SITE_OTHER): Payer: Medicare Other | Admitting: Physician Assistant

## 2017-03-18 ENCOUNTER — Telehealth: Payer: Self-pay | Admitting: Cardiovascular Disease

## 2017-03-18 ENCOUNTER — Encounter: Payer: Self-pay | Admitting: Physician Assistant

## 2017-03-18 VITALS — BP 126/58 | HR 66 | Temp 97.9°F | Resp 16 | Ht 66.0 in | Wt 242.2 lb

## 2017-03-18 DIAGNOSIS — G4733 Obstructive sleep apnea (adult) (pediatric): Secondary | ICD-10-CM | POA: Diagnosis not present

## 2017-03-18 DIAGNOSIS — E039 Hypothyroidism, unspecified: Secondary | ICD-10-CM | POA: Diagnosis not present

## 2017-03-18 DIAGNOSIS — E782 Mixed hyperlipidemia: Secondary | ICD-10-CM

## 2017-03-18 NOTE — Telephone Encounter (Signed)
Left message for patient to call me back, she called at approximately 1 pm and I could not take the call

## 2017-03-18 NOTE — Telephone Encounter (Signed)
New message   Pt verbalized that she is calling for rn   She has some questions on a past procedure and if Dr.Nasher read it or not

## 2017-03-18 NOTE — Telephone Encounter (Signed)
Spoke with patient who called to ask if Dr. Acie Fredrickson read the sleep study she had done several years ago. She states she is seeing a dentist to be fitted for a device that will help keep her from snoring. 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  I sent the patient a MyChart message that the sleep study was ordered and read by Dr. Radford Pax in 2015. I advised her to call back with any additional information.

## 2017-03-18 NOTE — Telephone Encounter (Signed)
Follow up ° ° ° °Patient returning call.  Please call °

## 2017-03-18 NOTE — Telephone Encounter (Signed)
Left message for patient to call back  

## 2017-03-18 NOTE — Patient Instructions (Addendum)
Current Outpatient Prescriptions:  .  apixaban (ELIQUIS) 5 MG TABS tablet, TAKE 1 TABLET(5 MG) BY MOUTH TWICE DAILY, Disp: 180 tablet, Rfl: 1 .  aspirin 325 MG tablet, Take 325 mg by mouth daily. , Disp: , Rfl:  .  Cyanocobalamin (VITAMIN B-12 PO), Take 1 tablet by mouth daily., Disp: , Rfl:  .  ferrous sulfate 325 (65 FE) MG tablet, Take 325 mg by mouth every morning. , Disp: , Rfl:  .  furosemide (LASIX) 40 MG tablet, Take 1 tablet (40 mg total) by mouth daily., Disp: 90 tablet, Rfl: 3 .  levothyroxine (SYNTHROID, LEVOTHROID) 50 MCG tablet, Take 0.5 tablets (25 mcg total) by mouth daily., Disp: 90 tablet, Rfl: 0 .  lisinopril (PRINIVIL,ZESTRIL) 20 MG tablet, Take 1 tablet (20 mg total) by mouth daily., Disp: 90 tablet, Rfl: 3 .  metoprolol tartrate (LOPRESSOR) 25 MG tablet, Take 1 tablet (25 mg total) by mouth 2 (two) times daily., Disp: 60 tablet, Rfl: 10 .  pantoprazole (PROTONIX) 40 MG tablet, Take 40 mg by mouth daily. , Disp: , Rfl:  .  potassium chloride (K-DUR) 10 MEQ tablet, Take 1 tablet (10 mEq total) by mouth daily., Disp: 90 tablet, Rfl: 3 .  pramipexole (MIRAPEX) 1 MG tablet, Take 1-3 mg by mouth at bedtime. , Disp: , Rfl:  .  traMADol (ULTRAM) 50 MG tablet, Take one tablet by mouth every 4 hours as needed for moderate pain; Take two tablets by mouth every 4 hours as needed for severe pain, Disp: 360 tablet, Rfl: 0 .  venlafaxine (EFFEXOR) 37.5 MG tablet, Take 37.5 mg by mouth 2 (two) times daily., Disp: , Rfl:  .  nitroGLYCERIN (NITROSTAT) 0.4 MG SL tablet, Place 1 tablet (0.4 mg total) under the tongue every 5 (five) minutes as needed for chest pain., Disp: 25 tablet, Rfl: 6   Recent Results (from the past 2160 hour(s))  POCT urinalysis dipstick     Status: None   Collection Time: 01/23/17  4:46 PM  Result Value Ref Range   Color, UA yellow yellow   Clarity, UA clear clear   Glucose, UA negative negative mg/dL   Bilirubin, UA negative negative   Ketones, POC UA negative  negative mg/dL   Spec Grav, UA 1.010 1.010 - 1.025   Blood, UA negative negative   pH, UA 6.5 5.0 - 8.0   Protein Ur, POC negative negative mg/dL   Urobilinogen, UA 0.2 0.2 or 1.0 E.U./dL   Nitrite, UA Negative Negative   Leukocytes, UA Negative Negative  POCT CBC     Status: Abnormal   Collection Time: 01/23/17  4:47 PM  Result Value Ref Range   WBC 8.5 4.6 - 10.2 K/uL   Lymph, poc 2.8 0.6 - 3.4   POC LYMPH PERCENT 33.1 10 - 50 %L   MID (cbc) 0.5 0 - 0.9   POC MID % 5.7 0 - 12 %M   POC Granulocyte 5.2 2 - 6.9   Granulocyte percent 61.2 37 - 80 %G   RBC 4.21 4.04 - 5.48 M/uL   Hemoglobin 12.6 12.2 - 16.2 g/dL   HCT, POC 36.4 (A) 37.7 - 47.9 %   MCV 86.5 80 - 97 fL   MCH, POC 30.0 27 - 31.2 pg   MCHC 34.7 31.8 - 35.4 g/dL   RDW, POC 15.0 %   Platelet Count, POC 218 142 - 424 K/uL   MPV 8.8 0 - 99.8 fL  POCT Microscopic Urinalysis (UMFC)  Status: None   Collection Time: 01/23/17  4:51 PM  Result Value Ref Range   WBC,UR,HPF,POC None None WBC/hpf   RBC,UR,HPF,POC None None RBC/hpf   Bacteria None None, Too numerous to count   Mucus Absent Absent   Epithelial Cells, UR Per Microscopy None None, Too numerous to count cells/hpf  T4, Free     Status: Abnormal   Collection Time: 01/23/17  5:23 PM  Result Value Ref Range   Free T4 0.78 (L) 0.82 - 1.77 ng/dL  Renal function panel     Status: Abnormal   Collection Time: 01/23/17  5:23 PM  Result Value Ref Range   Glucose 152 (H) 65 - 99 mg/dL   BUN 24 8 - 27 mg/dL   Creatinine, Ser 1.28 (H) 0.57 - 1.00 mg/dL   GFR calc non Af Amer 41 (L) >59 mL/min/1.73   GFR calc Af Amer 48 (L) >59 mL/min/1.73   BUN/Creatinine Ratio 19 12 - 28   Sodium 139 134 - 144 mmol/L   Potassium 4.0 3.5 - 5.2 mmol/L   Chloride 100 96 - 106 mmol/L   CO2 22 20 - 29 mmol/L   Calcium 9.7 8.7 - 10.3 mg/dL   Phosphorus 4.0 2.5 - 4.5 mg/dL   Albumin 4.0 3.5 - 4.8 g/dL  Lipid panel     Status: Abnormal   Collection Time: 02/14/17 12:31 PM  Result Value  Ref Range   Cholesterol, Total 235 (H) 100 - 199 mg/dL   Triglycerides 67 0 - 149 mg/dL   HDL 62 >39 mg/dL   VLDL Cholesterol Cal 13 5 - 40 mg/dL   LDL Calculated 160 (H) 0 - 99 mg/dL   Chol/HDL Ratio 3.8 0.0 - 4.4 ratio    Comment:                                   T. Chol/HDL Ratio                                             Men  Women                               1/2 Avg.Risk  3.4    3.3                                   Avg.Risk  5.0    4.4                                2X Avg.Risk  9.6    7.1                                3X Avg.Risk 23.4   11.0   Comprehensive metabolic panel     Status: Abnormal   Collection Time: 02/14/17 12:31 PM  Result Value Ref Range   Glucose 91 65 - 99 mg/dL   BUN 15 8 - 27 mg/dL   Creatinine, Ser 1.12 (H) 0.57 - 1.00 mg/dL   GFR calc non Af Amer 49 (L) >59 mL/min/1.73  GFR calc Af Amer 56 (L) >59 mL/min/1.73   BUN/Creatinine Ratio 13 12 - 28   Sodium 141 134 - 144 mmol/L   Potassium 4.6 3.5 - 5.2 mmol/L   Chloride 101 96 - 106 mmol/L   CO2 24 20 - 29 mmol/L   Calcium 9.8 8.7 - 10.3 mg/dL   Total Protein 7.6 6.0 - 8.5 g/dL   Albumin 4.0 3.5 - 4.8 g/dL   Globulin, Total 3.6 1.5 - 4.5 g/dL   Albumin/Globulin Ratio 1.1 (L) 1.2 - 2.2   Bilirubin Total 0.5 0.0 - 1.2 mg/dL   Alkaline Phosphatase 111 39 - 117 IU/L   AST 31 0 - 40 IU/L   ALT 16 0 - 32 IU/L  Basic metabolic panel     Status: Abnormal   Collection Time: 02/16/17 11:43 AM  Result Value Ref Range   Sodium 139 135 - 145 mmol/L   Potassium 4.3 3.5 - 5.1 mmol/L   Chloride 106 101 - 111 mmol/L   CO2 27 22 - 32 mmol/L   Glucose, Bld 103 (H) 65 - 99 mg/dL   BUN 14 6 - 20 mg/dL   Creatinine, Ser 1.01 (H) 0.44 - 1.00 mg/dL   Calcium 9.5 8.9 - 10.3 mg/dL   GFR calc non Af Amer 53 (L) >60 mL/min   GFR calc Af Amer >60 >60 mL/min    Comment: (NOTE) The eGFR has been calculated using the CKD EPI equation. This calculation has not been validated in all clinical situations. eGFR's  persistently <60 mL/min signify possible Chronic Kidney Disease.    Anion gap 6 5 - 15  CBC     Status: None   Collection Time: 02/16/17 11:43 AM  Result Value Ref Range   WBC 6.4 4.0 - 10.5 K/uL   RBC 3.99 3.87 - 5.11 MIL/uL   Hemoglobin 12.0 12.0 - 15.0 g/dL   HCT 36.8 36.0 - 46.0 %   MCV 92.2 78.0 - 100.0 fL   MCH 30.1 26.0 - 34.0 pg   MCHC 32.6 30.0 - 36.0 g/dL   RDW 14.6 11.5 - 15.5 %   Platelets 171 150 - 400 K/uL  I-stat troponin, ED     Status: None   Collection Time: 02/16/17 12:05 PM  Result Value Ref Range   Troponin i, poc 0.02 0.00 - 0.08 ng/mL   Comment 3            Comment: Due to the release kinetics of cTnI, a negative result within the first hours of the onset of symptoms does not rule out myocardial infarction with certainty. If myocardial infarction is still suspected, repeat the test at appropriate intervals.   TSH     Status: Abnormal   Collection Time: 02/16/17 12:48 PM  Result Value Ref Range   TSH 14.605 (H) 0.350 - 4.500 uIU/mL    Comment: Performed by a 3rd Generation assay with a functional sensitivity of <=0.01 uIU/mL.          IF you received an x-ray today, you will receive an invoice from [facility]. Please contact [facility] at [phone] with questions or concerns regarding your invoice.   IF you received labwork today, you will receive an invoice from [place]. Please contact [facility] at [phone] with questions or concerns regarding your invoice.   Our billing staff will not be able to assist you with questions regarding bills from these companies.  You will be contacted with the lab results as soon as they are  available. The fastest way to get your results is to activate your My Chart account. Instructions are located on the last page of this paperwork. If you have not heard from [place] regarding the results in 2 weeks, please contact this office.

## 2017-03-18 NOTE — Progress Notes (Signed)
03/24/2017 7:50 AM   DOB: 1941-01-13 / MRN: [id number]  SUBJECTIVE:  [name] is a 76 y.o. female presenting for thyroid recheck. She is up to 50 mg of tsh at this time at the direction of her old physician. She tells me that she has much less fatigue since this regimen.   Complains of arthritis in her knees. Has had interarticular injections with some relief but returns to baseline quickly.   She is trying to move to [facility].  Plans to do this ASAP. She plans to seek care there and will establish with a new provider.   She is allergic to other; sulfamethoxazole-trimethoprim; cortisone; latex; and tape.   She  has a past medical history of Anxiety, Coronary artery disease, Environmental allergies, Essential iris atrophy, Fatigue, Fibromyalgia, GERD (gastroesophageal reflux disease), Glaucoma, Hyperlipidemia, Hypertension, Menopause, Obesity (BMI 30-39.9), OSA (obstructive sleep apnea), Osteoarthritis, and Oxygen dependent.    She  reports that she quit smoking about 56 years ago. Her smoking use included cigarettes. she has never used smokeless tobacco. She reports that she does not drink alcohol or use drugs. She  has no sexual activity history on file. The patient  has a past surgical history that includes Cardiac catheterization (06/29/2010); Cardiac catheterization (08/02/2009); Cardiac catheterization (05/12/2008); SUTURE REPAIR OF RADIAL ARTERY AND EVACUATION OF HEMATOMA (Right, 10/21/2015); CORONARY ARTERY BYPASS GRAFTING (CABG) TIMES 1 USING LEFT INTERNAL MAMMARY TO THE LAD (N/A, 09/23/2015); CLIPPING OF ATRIAL APPENDAGE (N/A, 09/23/2015); TRANSESOPHAGEAL ECHOCARDIOGRAM (TEE) (N/A, 09/23/2015); MAZE (N/A, 09/23/2015); Left Heart Cath and Coronary Angiography (N/A, 09/20/2015); Intravascular Ultrasound/IVUS (09/20/2015); and Intravascular Pressure Wire/FFR Study (09/20/2015).  Her family history includes Breast cancer in her sister; Cancer in her father; Coronary artery disease in her brother, brother,  brother, brother, and brother; Heart attack in her brother and mother.  Review of Systems  Constitutional: Negative for chills, diaphoresis and fever.  Eyes: Negative.   Respiratory: Negative for cough, hemoptysis, sputum production, shortness of breath and wheezing.   Cardiovascular: Negative for chest pain, orthopnea and leg swelling.  Gastrointestinal: Negative for nausea.  Skin: Negative for rash.  Neurological: Negative for dizziness, sensory change, speech change, focal weakness and headaches.    The problem list and medications were reviewed and updated by myself where necessary and exist elsewhere in the encounter.   OBJECTIVE:  BP (!) 126/58 (BP Location: Right Arm, Patient Position: Sitting, Cuff Size: Large)   Pulse 66   Temp 97.9 F (36.6 C) (Oral)   Resp 16   Ht 5\' 6"  (1.676 m)   Wt 242 lb 3.2 oz (109.9 kg)   SpO2 97%   BMI 39.09 kg/m   Lab Results  Component Value Date   CHOL 235 (H) 02/14/2017   HDL 62 02/14/2017   LDLCALC 160 (H) 02/14/2017   LDLDIRECT 144.0 11/21/2010   TRIG 67 02/14/2017   CHOLHDL 3.8 02/14/2017   Wt Readings from Last 3 Encounters:  03/18/17 242 lb 3.2 oz (109.9 kg)  02/16/17 243 lb (110.2 kg)  02/14/17 243 lb 3.2 oz (110.3 kg)      Physical Exam  Constitutional: She is active.  Non-toxic appearance.  Cardiovascular: Normal rate, regular rhythm, S1 normal, S2 normal, normal heart sounds and intact distal pulses. Exam reveals no gallop, no friction rub and no decreased pulses.  No murmur heard. Pulmonary/Chest: Effort normal. No tachypnea. She has no rales.  Abdominal: She exhibits no distension.  Musculoskeletal: She exhibits no edema.  Neurological: She is alert.  Skin: Skin is warm and dry. She is not diaphoretic. No pallor.    No results found for this or any previous visit (from the past 72 hour(s)).  No results found.  ASSESSMENT AND PLAN:  [name] was seen today for follow-up.  Diagnoses and all orders for this  visit:  Hypothyroidism, unspecified type: Now managed elsewhere.  She feels well today.   Obstructive sleep apnea: She has an appointment with a specialized orthodontist for a new device and would like me to sign her forms. I have completed for her.     The patient is advised to call or return to clinic if she does not see an improvement in symptoms, or to seek the care of the closest emergency department if she worsens with the above plan.   Philis Fendt, MHS, PA-C [facility] 03/24/2017 7:50 AM

## 2017-03-21 NOTE — Telephone Encounter (Signed)
Called to advise patient of the sleep study followed by Dr. Radford Pax. She requests it be faxed to Dr. Ron Parker, DDS and I verified the fax number.  Report faxed through epic. Patient states she saw her PCP today and he asked why she was not taking a cholesterol medicine. I advised that there must have been a reason she had to stop and last refill was 12/2015. She states she cannot remember the reason except that it seems like her lab work was abnormal. In May 2017, patient had abnormal AST and ALT. She asks if she should restart the medication. I advised I will forward to Dr. Acie Fredrickson for review. She verbalized understanding and agreement and thanked me for the call.

## 2017-03-22 NOTE — Telephone Encounter (Signed)
Im not sure why  the statin was stopped. Please start crestor 5 mg a day  Check BMP , Liver enz. And lipid profile in 3 months

## 2017-03-27 MED ORDER — ROSUVASTATIN CALCIUM 5 MG PO TABS: 5.0000 mg | ORAL_TABLET | Freq: Every day | ORAL | 3 refills | Status: AC

## 2017-03-27 NOTE — Telephone Encounter (Signed)
Spoke with patient and advised her that Dr. Acie Fredrickson would like her to restart Crestor 5 mg. I scheduled her for repeat fasting labs on 2/7 and advised her to call back with questions or concerns prior to follow-up. She verbalized understanding and agreement and thanked me for the call.

## 2017-04-04 ENCOUNTER — Other Ambulatory Visit: Payer: Self-pay | Admitting: Physician Assistant

## 2017-04-04 NOTE — Telephone Encounter (Signed)
Refill for correct dosage of levothyroxine

## 2017-04-04 NOTE — Telephone Encounter (Signed)
Pt called requesting refill on levothyroxine; she was last seen by Philis Fendt, PA at [facility] on 03/18/17, and they discussed that her medication should be 50 mcg not .5; pt states that she needs a prescription for the correct dose of levothyroxine sent [facility] on [address] [place], [place]; She also states that "she has been out of the medication and that she can feel the effects"; pt can be reached at [phone] (pt was peviously seen by Dr Alroy Dust); will route to poole at [facility]

## 2017-04-04 NOTE — Telephone Encounter (Signed)
Copied from [place] [id number]. Topic: Quick Communication - See Telephone Encounter >> Apr 04, 2017 11:32 AM Aurelio Brash B wrote: CRM for notification. See Telephone encounter for:   Pt needs refill on levothyroxine  Pt is out of the med and Dr was to up the dosage. Current prescription is .5    PT was told to take 2,  so is out now because she doubled up.  Also asking which other of her meds  of hers can be refilled     [employer]     04/04/17.

## 2017-04-05 NOTE — Telephone Encounter (Signed)
Spoke with pt.  She states the rx was supposed to be 3mcg per Dr. Alroy Dust and Legrand Como agreed at visit. Sent to Legrand Como to review and advise

## 2017-04-17 NOTE — Telephone Encounter (Signed)
Please forward to Dr. Alroy Dust. Patient will not be coming back to clinic because she is moving away. I did supply her with some synthroid about 1.5 months ago and she can use that until she gets squared away with a new provider in [place], or she gets through to Dr. Alroy Dust.

## 2017-05-28 ENCOUNTER — Telehealth: Payer: Self-pay | Admitting: Physician Assistant

## 2017-05-28 NOTE — Telephone Encounter (Signed)
Copied from [place] [phone]. Topic: Quick Communication - See Telephone Encounter >> May 28, 2017  4:28 PM Corie Chiquito, [place] wrote: CRM for notification. See Telephone encounter for: Patient calling because she would like to know if her Levothyroxine medication can be increased. If someone could give her a call at [phone]  05/28/17.

## 2017-05-29 NOTE — Telephone Encounter (Signed)
Patient called back to discuss her question about increasing her levothyroxine, she said she is not feeling any better, still unable to walk very much, she says she will be back in town on Friday afternoon and can come for lab work if that is what is needed. I told her she would be notified with the decision about her medication and lab work.

## 2017-05-29 NOTE — Telephone Encounter (Signed)
Attempted to call patient- mailbox is full- can not accept messages.

## 2017-05-29 NOTE — Telephone Encounter (Signed)
Pt returning call about medication. Please advise.

## 2017-05-30 ENCOUNTER — Telehealth: Payer: Self-pay | Admitting: Cardiovascular Disease

## 2017-05-30 NOTE — Telephone Encounter (Signed)
Patient made an appt for tomorrow, 05/31/17 at 5:40pm

## 2017-05-30 NOTE — Telephone Encounter (Signed)
Spoke with patient who states she has chest soreness between her breasts and throat for 3 days. She states the pain is worse at times with deep breathing but sometimes occurs without movement. 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 States she has increased life stress at this time. She has hx of CABG in 2017. She complains of recent viral cold and cough and is wheezing when she lays down. States she told her PCP and nothing was done. She requests an appointment to see Dr. Acie Fredrickson. There are no available appointments with Dr. Acie Fredrickson or APP. I advised that he will be in the office tomorrow and that I will discuss her concerns with him and call her with his advice; she verbalized understanding and agreement. I advised her if symptoms worsen tonight to call 911 and she verbalized agreement. She thanked me for my help.

## 2017-05-30 NOTE — Telephone Encounter (Signed)
New Message   Pt c/o of Chest Pain: STAT if CP now or developed within 24 hours  1. Are you having CP right now? No   2. Are you experiencing any other symptoms (ex. SOB, nausea, vomiting, sweating)? SOB   3. How long have you been experiencing CP? About 3 days   4. Is your CP continuous or coming and going? Comes and go  5. Have you taken Nitroglycerin? No  Patient indicates that she only feels the chest pain when she yawns, stretch or move around sometimes. Please call. swinyer ?

## 2017-05-31 ENCOUNTER — Other Ambulatory Visit: Payer: Self-pay

## 2017-05-31 ENCOUNTER — Ambulatory Visit (INDEPENDENT_AMBULATORY_CARE_PROVIDER_SITE_OTHER): Payer: Medicare Other | Admitting: Physician Assistant

## 2017-05-31 ENCOUNTER — Ambulatory Visit (INDEPENDENT_AMBULATORY_CARE_PROVIDER_SITE_OTHER): Payer: Medicare Other | Admitting: Cardiovascular Disease

## 2017-05-31 ENCOUNTER — Encounter: Payer: Self-pay | Admitting: Cardiovascular Disease

## 2017-05-31 ENCOUNTER — Encounter: Payer: Self-pay | Admitting: Physician Assistant

## 2017-05-31 VITALS — HR 74 | Temp 97.7°F | Resp 18 | Ht 65.0 in | Wt 249.4 lb

## 2017-05-31 VITALS — BP 136/74 | HR 72 | Ht 65.0 in | Wt 248.1 lb

## 2017-05-31 DIAGNOSIS — I480 Paroxysmal atrial fibrillation: Secondary | ICD-10-CM

## 2017-05-31 DIAGNOSIS — I2510 Atherosclerotic heart disease of native coronary artery without angina pectoris: Secondary | ICD-10-CM

## 2017-05-31 DIAGNOSIS — E039 Hypothyroidism, unspecified: Secondary | ICD-10-CM | POA: Diagnosis not present

## 2017-05-31 MED ORDER — ASPIRIN EC 81 MG PO TBEC: 81.0000 mg | DELAYED_RELEASE_TABLET | Freq: Every day | ORAL | Status: DC

## 2017-05-31 MED ORDER — ZOSTER VAC RECOMB ADJUVANTED 50 MCG/0.5ML IM SUSR: 0.5000 mL | Freq: Once | INTRAMUSCULAR | 1 refills | Status: AC

## 2017-05-31 NOTE — Patient Instructions (Signed)
Medication Instructions:  Your physician has recommended you make the following change in your medication:  DECREASE Aspirin to 81 mg daily   Labwork: TODAY - cholesterol, liver panel, basic metabolic panel   Testing/Procedures: None Ordered   Follow-Up: Your physician wants you to follow-up in: 6 months with Dr. Acie Fredrickson.  You will receive a reminder letter in the mail two months in advance. If you don't receive a letter, please call our office to schedule the follow-up appointment.   If you need a refill on your cardiac medications before your next appointment, please call your pharmacy.   Thank you for choosing [facility]! Christen Bame, RN [phone]

## 2017-05-31 NOTE — Progress Notes (Signed)
Vitals:   05/31/17 1808  Pulse: 74  Resp: 18  Temp: 97.7 F (36.5 C)  SpO2: 97%

## 2017-05-31 NOTE — Progress Notes (Signed)
Patient Name: [name] Date of Encounter: 05/31/2017  Primary Care Provider:  Tereasa Coop, PA-C Primary Cardiologist:  Joaquim Nam, MD  Problem List  1. CAD - s/p CABG Sep 22, 2015.  2. Hyperlipidemia 3. Obstructive sleep apnea 4. Hypertension 5. Atrial fib    April 78, 6638:  77 year old female with the above problem list.  She was in usual state of health about 3 weeks ago when she began to note mild lower extremity edema with bilateral ankle and calf pain with ambulation.  She takes her left leg has been more swollen than the right.  She has been relatively active recently partaking and gardening without any chest pain or dyspnea.  Her weight is actually down from baseline.  She's seen no change in her salt intake or amt of time that her legs might be in a dependent position.  She's quite flustered and anxious today.  She misplaced her phone and her dtr initially dropped her off @ the old [place] cardiology office and she had to walk down the street to here.  She doesn't know how to reach her dtr to have her come down and pick her up.  She's tearful and emotionally upset.  In that setting, after the visit, pt said that she thinks she's having chest pain.  An ecg was done, and before it was even completed, c/p resolved, as did her emotional upset.  Oct 08, 2012:  [name] is doing well. She's not had any episodes of chest pain or shortness of breath. She was seen by our PA last year. She has a history of coronary artery disease but has not had any recurrent angina. She's been working out in her garden without difficulties.  She has occasional episodes of orthostatic hypotension and also notes some occasional leg edema. I suspect this is from the amlodipine.  October 20, 2013:  [name] has seen [name] and was referred to [name] for management of her sleep apnea.   Her BP has been well controlled.  She needs to have a special dental appliance made for her OSA- did not tolerate  the CPAP. She has chronic dyspnea. No CP   She has found to have some thyroid issues.  Sees Dr. Alroy Dust for this.     December 29, 2014:  [name] is seen back today for evaulation of her CAD and HTN  And a recent episode of atrial fib. She was admitted and converted back to normal  ( ? No CP , BP has been well controlled    July 29, 2015:  Has chronic fatigue  Has had thyroid issues Doing well from a cardiac standpoint   Oct. 5, 2017:  [name] presented to the hospital with CP Sep 19, 2015. Cath showed prox LAD disease that was not amenable to PCI [name] had CABG  And MAZE procedure Sep 22, 2015. Complicated by a right radial hematoma - was eventually fixed on October 21, 2015   Nov. 15, 2017  [name] is doing well. She had coronary artery bypass grafting in May 2017. She has a history of atrial fibrillation and also had a MAZE  procedure with the coronary artery bypass grafting. We stopped her amiodarone at her last visit.  Feb. 26, 2018:  Doing ok Has had some leg swelling and BP is elevated.  Uses some salt .  No CP . Has had some DOE . Echo in Dec. 2017 shows normal LV systolic function with grade 2 Diastolic  dysfunction   Sep 19, 2016: [name] is here to follow up with her CAD - she status post coronary artery bypass grafting ( LIMA to LAD + MAZE procedure )  in 2017. She had a Maze procedure at that time.  Is having some left leg edema .  trjied 3 days of double dose lasix - did not seem to help  Echo in Dec. Showed normal LV function with grade 2 DD.   Jan. 11, 2019 Seen today as a work in visit for CP . Occurs when she hiccups or swallow or if she turns her torso a certain way . Worse with eating / swallowing  Feels sore.   No angina pain that is similar to her pre-CABG pain  Has a tender and swollen left leg   Past Medical History:  Diagnosis Date  . Anxiety   . Coronary artery disease    a. s/p MI 2009 - PCI/DES distal  RCA w/ 2.75 x 18 Xience DES;  b. 05/2010 Myoview:  Apical thinning, EF 73%;  c. 06/2010 Cath: moderate nonobs dzs, EF 65%;  d.  Lexiscan Myoview (07/2013):  No scar or ischemia, EF 67%; Normal Study  . Environmental allergies   . Essential iris atrophy    Right side  . Fatigue   . Fibromyalgia   . GERD (gastroesophageal reflux disease)   . Glaucoma   . Hyperlipidemia   . Hypertension   . Menopause   . Obesity (BMI 30-39.9)   . OSA (obstructive sleep apnea)    does not wear CPAP  . Osteoarthritis   . Oxygen dependent    3L   Past Surgical History:  Procedure Laterality Date  . CARDIAC CATHETERIZATION  06/29/2010   Mild to moderate coronary artery irregularities.  Her  proximal left anterior descending artery is moderately narrowed.  She  also has an eccentric stenosis in the proximal LAD.  These do not appear  to obstruct flow at present, but they are fairly small vessels.  They  appeared to be between 1.5 and 2 mm in diamete  . CARDIAC CATHETERIZATION  08/02/2009   Patent stent  . CARDIAC CATHETERIZATION  05/12/2008   Stent to the distal RCA  . CARDIAC CATHETERIZATION N/A 09/20/2015   Procedure: Left Heart Cath and Coronary Angiography;  Surgeon: Jettie Booze, MD;  Location: [facility];  Service: Cardiovascular;  Laterality: N/A;  . CARDIAC CATHETERIZATION  09/20/2015   Procedure: Intravascular Ultrasound/IVUS;  Surgeon: Jettie Booze, MD;  Location: [place] [facility];  Service: Cardiovascular;;  . CARDIAC CATHETERIZATION  09/20/2015   Procedure: Intravascular Pressure Wire/FFR Study;  Surgeon: Jettie Booze, MD;  Location: [place] [facility];  Service: Cardiovascular;;  . CLIPPING OF ATRIAL APPENDAGE N/A 09/23/2015   Procedure:  CLIPPING OF ATRIAL APPENDAGE;  Surgeon: Grace Isaac, MD;  Location: [place] [facility];  Service: Open Heart Surgery;  Laterality: N/A;  . CORONARY ARTERY BYPASS GRAFT N/A 09/23/2015   Procedure: CORONARY ARTERY BYPASS GRAFTING (CABG) TIMES 1 USING LEFT INTERNAL MAMMARY TO THE LAD;  Surgeon: Grace Isaac, MD;  Location: [place];  Service: Open Heart Surgery;  Laterality: N/A;  . MAZE N/A 09/23/2015   Procedure: MAZE;  Surgeon: Grace Isaac, MD;  Location: [place];  Service: Open Heart Surgery;  Laterality: N/A;  . RESECTION OF ARTERIOVENOUS FISTULA ANEURYSM Right 10/21/2015   Procedure: SUTURE REPAIR OF RADIAL ARTERY AND EVACUATION OF HEMATOMA;  Surgeon: Mal Misty, MD;  Location:  [facility];  Service: Vascular;  Laterality: Right;  . TEE WITHOUT CARDIOVERSION N/A 09/23/2015   Procedure: TRANSESOPHAGEAL ECHOCARDIOGRAM (TEE);  Surgeon: Grace Isaac, MD;  Location: [place];  Service: Open Heart Surgery;  Laterality: N/A;    Allergies  Allergies  Allergen Reactions  . Other Nausea Only    NO MEAT!!  . Sulfamethoxazole-Trimethoprim Nausea Only  . Cortisone Nausea Only and Other (See Comments)    Pt gets very hot and flushed  . Latex Rash    Redness and irritation  . Tape Rash    Redness and irritation      Home Medications  Prior to Admission medications   Medication Sig Start Date End Date Taking? Authorizing Provider  acetaminophen (TYLENOL) 500 MG tablet Take 500 mg by mouth as needed.    Yes Historical Provider, MD  amitriptyline (ELAVIL) 10 MG tablet Take 10 mg by mouth at bedtime.   Yes Historical Provider, MD  amLODipine (NORVASC) 5 MG tablet Take 1 tablet (5 mg total) by mouth daily. 09/03/11 09/02/12 Yes Thayer Headings, MD  aspirin 325 MG tablet Take 325 mg by mouth daily.     Yes Historical Provider, MD  carvedilol (COREG) 12.5 MG tablet Take 1 tablet (12.5 mg total) by mouth 2 (two) times daily. 09/03/11 09/02/12 Yes Thayer Headings, MD  clopidogrel (PLAVIX) 75 MG tablet TAKE ONE TABLET BY MOUTH DAILY 07/06/11  Yes Burtis Junes, NP  diclofenac sodium (VOLTAREN) 1 % GEL Apply topically. **CREAM** use as directed    Yes Historical Provider, MD  fluticasone (FLONASE) 50 MCG/ACT nasal spray Place 1 spray into the nose daily.   Yes Historical Provider, MD    hydrochlorothiazide 25 MG tablet Take 25 mg by mouth daily.    Yes Historical Provider, MD  HYDROcodone-homatropine (HYCODAN) 5-1.5 MG/5ML syrup Take 5 mLs by mouth every 8 (eight) hours as needed for cough. 08/29/11 09/08/11 Yes Robyn Haber, MD  IRON PO Take 1 tablet by mouth daily.     Yes Historical Provider, MD  levofloxacin (LEVAQUIN) 500 MG tablet Take 1 tablet (500 mg total) by mouth daily. 08/29/11 09/08/11 Yes Robyn Haber, MD  methimazole (TAPAZOLE) 5 MG tablet Take 5 mg by mouth daily.   Yes Historical Provider, MD  nitroGLYCERIN (NITROSTAT) 0.4 MG SL tablet Place 0.4 mg under the tongue every 5 (five) minutes as needed.     Yes Historical Provider, MD  pantoprazole (PROTONIX) 40 MG tablet Take 40 mg by mouth 2 (two) times daily.    Yes Historical Provider, MD  potassium chloride (KLOR-CON) 10 MEQ CR tablet Take 1 tablet (10 mEq total) by mouth daily. 02/23/11  Yes Thayer Headings, MD  pramipexole (MIRAPEX) 0.25 MG tablet Take 1 mg by mouth at bedtime.    Yes Historical Provider, MD  rosuvastatin (CRESTOR) 10 MG tablet Take 1 tablet (10 mg total) by mouth daily. 06/06/11  Yes Thayer Headings, MD  traMADol (ULTRAM) 50 MG tablet Take 50 mg by mouth 3 (three) times daily as needed.    Yes Historical Provider, MD  venlafaxine (EFFEXOR) 75 MG tablet Take 75 mg by mouth daily. 1/2 tablet 2 (two) times daily   Yes Historical Provider, MD  lisinopril (PRINIVIL,ZESTRIL) 40 MG tablet Take 1 tablet (40 mg total) by mouth daily. 09/07/11 09/06/12  Thayer Headings, MD     Physical Exam: Blood pressure 136/74, pulse 72, height 5\' 5"  (1.651 m), weight 248 lb 1.9 oz (112.5 kg).  GEN:  Well nourished, well developed in no acute distress HEENT: Normal NECK: No JVD;  Soft left carotid bruit .  LYMPHATICS: No lymphadenopathy CARDIAC: RR, 2/6 systolic murmur  RESPIRATORY:  Clear to auscultation without rales, wheezing or rhonchi  ABDOMEN: Soft, non-tender, non-distended MUSCULOSKELETAL:  Left leg 1+  edema and bruising  SKIN: Warm and dry NEUROLOGIC:  Alert and oriented x 3   ECG  Jan.. 11, 2019:   NSR at 72.   NS ST abn.   Assessment and Plan   1. Chronic diastolic congestive heart failure.    Stable    2. Chest pain :   Pain is esophageal .   Hurts worse when she swallows or twists her torso .  Does not sound like angina   3. CAD:   Stable   3. Hyperlipidemia -     4. Obstructive sleep apnea-     5. Hypertension - - BP is well controlled.    5. Atrial fib - Continue Eliquis.   She had a MAZE procedure with her surgery . She has maintained NSR        6. Left leg edema :  .     Mertie Moores, MD  05/31/2017 4:41 PM    [facility],  [address] [place], [place]  [address] Pager [phone] Phone: [phone]; Fax: [phone]

## 2017-05-31 NOTE — Progress Notes (Signed)
06/02/2017 6:52 PM   DOB: March 11, 1941 / MRN: [id number]  SUBJECTIVE:  [name] is a 77 y.o. female presenting for thyroid check. Feels that she is not getting enough due to some residual fatigue that had improved after staring synthroid.  She would like a higher dose if possible.   Immunization History  Administered Date(s) Administered  . Influenza,inj,Quad PF,6+ Mos 04/27/2016, 02/14/2017  . PPD Test 10/31/2015  . Pneumococcal Conjugate-13 09/30/2013  . Pneumococcal Polysaccharide-23 04/27/2016  . Tdap 04/03/2012    She is allergic to other; sulfamethoxazole-trimethoprim; cortisone; latex; and tape.   She  has a past medical history of Anxiety, Coronary artery disease, Environmental allergies, Essential iris atrophy, Fatigue, Fibromyalgia, GERD (gastroesophageal reflux disease), Glaucoma, Hyperlipidemia, Hypertension, Menopause, Obesity (BMI 30-39.9), OSA (obstructive sleep apnea), Osteoarthritis, and Oxygen dependent.    She  reports that she quit smoking about 56 years ago. Her smoking use included cigarettes. she has never used smokeless tobacco. She reports that she does not drink alcohol or use drugs. She  has no sexual activity history on file. 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  Her family history includes Breast cancer in her sister; Cancer in her father; Coronary artery disease in her brother, brother, brother, brother, and brother; Heart attack in her brother and mother.  Review of Systems  Constitutional: Negative  for chills, diaphoresis and fever.  Eyes: Negative.   Respiratory: Negative for shortness of breath.   Cardiovascular: Negative for chest pain, orthopnea and leg swelling.  Gastrointestinal: Negative for abdominal pain, blood in stool, constipation, diarrhea, heartburn, melena, nausea and vomiting.  Genitourinary: Negative for flank pain.  Skin: Negative for rash.  Neurological: Negative for dizziness, sensory change, speech change, focal weakness and headaches.    The problem list and medications were reviewed and updated by myself where necessary and exist elsewhere in the encounter.   OBJECTIVE:  Pulse 74   Temp 97.7 F (36.5 C) (Oral)   Resp 18   Ht 5\' 5"  (1.651 m)   Wt 249 lb 6.4 oz (113.1 kg)   SpO2 97%   BMI 41.50 kg/m   Physical Exam  Constitutional: She is active.  Non-toxic appearance.  Cardiovascular: Normal rate, regular rhythm, S1 normal, S2 normal, normal heart sounds and intact distal pulses. Exam reveals no gallop, no friction rub and no decreased pulses.  No murmur heard. Pulmonary/Chest: Effort normal. No stridor. No tachypnea. No respiratory distress. She has no wheezes. She has no rales.  Abdominal: She exhibits no distension.  Musculoskeletal: She exhibits no edema.  Neurological: She is alert.  Skin: Skin is warm and dry. She is not diaphoretic. No pallor.    Results for orders placed or performed in visit on 05/31/17 (from the past 72 hour(s))  TSH     Status: Abnormal   Collection Time: 05/31/17  6:05 PM  Result Value Ref Range   TSH 8.170 (H) 0.450 - 4.500 uIU/mL  T4, free     Status: None   Collection Time: 05/31/17  6:05 PM  Result Value Ref Range   Free T4 0.93 0.82 - 1.77 ng/dL    No results found.  ASSESSMENT AND PLAN:  Annye was seen  today for hypothyroidism.  Diagnoses and all orders for this visit:  Hypothyroidism, unspecified type: Will titrate to eu-tsh.  Pushing dose up to 75 and will see her back for recheck in 6 weeks.  -      TSH -     T4, free  Other orders -     Zoster Vaccine Adjuvanted [facility]) injection; Inject 0.5 mLs into the muscle once for 1 dose. Booster due at 6 months.    The patient is advised to call or return to clinic if she does not see an improvement in symptoms, or to seek the care of the closest emergency department if she worsens with the above plan.   Philis Fendt, MHS, PA-C [facility] 06/02/2017 6:52 PM

## 2017-05-31 NOTE — Telephone Encounter (Signed)
Patient is scheduled to see Dr. Acie Fredrickson today at 4:20

## 2017-05-31 NOTE — Telephone Encounter (Signed)
Will work her in today

## 2017-06-01 ENCOUNTER — Other Ambulatory Visit: Payer: Self-pay | Admitting: Cardiovascular Disease

## 2017-06-01 ENCOUNTER — Other Ambulatory Visit: Payer: Self-pay | Admitting: Physician Assistant

## 2017-06-01 DIAGNOSIS — E039 Hypothyroidism, unspecified: Secondary | ICD-10-CM

## 2017-06-01 LAB — BASIC METABOLIC PANEL
BUN/Creatinine Ratio: 21 (ref 12–28)
BUN: 23 mg/dL (ref 8–27)
CHLORIDE: 101 mmol/L (ref 96–106)
CO2: 22 mmol/L (ref 20–29)
Calcium: 9.5 mg/dL (ref 8.7–10.3)
Creatinine, Ser: 1.11 mg/dL — ABNORMAL HIGH (ref 0.57–1.00)
GFR calc Af Amer: 56 mL/min/{1.73_m2} — ABNORMAL LOW (ref >59)
GFR calc non Af Amer: 49 mL/min/{1.73_m2} — ABNORMAL LOW (ref >59)
GLUCOSE: 115 mg/dL — AB (ref 65–99)
Potassium: 4.2 mmol/L (ref 3.5–5.2)
SODIUM: 140 mmol/L (ref 134–144)

## 2017-06-01 LAB — T4, FREE: FREE T4: 0.93 ng/dL (ref 0.82–1.77)

## 2017-06-01 LAB — HEPATIC FUNCTION PANEL
ALBUMIN: 4.1 g/dL (ref 3.5–4.8)
ALT: 12 IU/L (ref 0–32)
AST: 22 IU/L (ref 0–40)
Alkaline Phosphatase: 113 IU/L (ref 39–117)
Bilirubin Total: 0.3 mg/dL (ref 0.0–1.2)
Bilirubin, Direct: 0.13 mg/dL (ref 0.00–0.40)
Total Protein: 7.9 g/dL (ref 6.0–8.5)

## 2017-06-01 LAB — LIPID PANEL
CHOL/HDL RATIO: 3.2 ratio (ref 0.0–4.4)
Cholesterol, Total: 184 mg/dL (ref 100–199)
HDL: 58 mg/dL (ref >39)
LDL Calculated: 105 mg/dL — ABNORMAL HIGH (ref 0–99)
Triglycerides: 105 mg/dL (ref 0–149)
VLDL Cholesterol Cal: 21 mg/dL (ref 5–40)

## 2017-06-01 LAB — TSH: TSH: 8.17 u[IU]/mL — AB (ref 0.450–4.500)

## 2017-06-01 MED ORDER — LEVOTHYROXINE SODIUM 75 MCG PO TABS: 75.0000 ug | ORAL_TABLET | Freq: Every day | ORAL | 0 refills | Status: DC

## 2017-06-27 ENCOUNTER — Other Ambulatory Visit: Payer: Medicare Other

## 2017-07-21 ENCOUNTER — Other Ambulatory Visit: Payer: Self-pay | Admitting: Cardiovascular Disease

## 2017-08-16 ENCOUNTER — Telehealth: Payer: Self-pay | Admitting: Cardiovascular Disease

## 2017-08-16 NOTE — Telephone Encounter (Signed)
NOTED ./CY 

## 2017-08-16 NOTE — Telephone Encounter (Signed)
The BP of 185/165 is likely an measurement error.  Will address at the office visit

## 2017-08-16 NOTE — Telephone Encounter (Signed)
Pt has noted some weakness ,nose bleeds (that are hard to stop) and elevated b/p 185/165 but today was 110/69 pt requesting an appt Encouraged pt to try nasal salines for nose and to continue to monitor B/p and bring log and bring readings to appt on 08-22-17 at 3:20 pm ./cy

## 2017-08-16 NOTE — Telephone Encounter (Signed)
New message    Pt c/o BP issue: STAT if pt c/o blurred vision, one-sided weakness or slurred speech  1. What are your last 5 BP readings? 110/69  Today at doctors   Patients states its been between 170- 209 (top number   2. Are you having any other symptoms (ex. Dizziness, headache, blurred vision, passed out)?  Very weak , nose bleed  (she states she has a cold)   3. What is your BP issue?  She was at the doctors office today and got really weak they gave her some food and it helped a little

## 2017-08-20 ENCOUNTER — Telehealth: Payer: Self-pay | Admitting: Cardiovascular Disease

## 2017-08-20 NOTE — Telephone Encounter (Signed)
Spoke with the patient in regards to her recent Bps...  Over the past 2 weeks her systolic Bps have been intermittently above 200, while her diastolic range has been in the 70-80s..    She is asymptomatic and has a f/u on 4/4.Marland Kitchen  She will keep a log of her Bps over the next day and keep [place] informed.Marland Kitchen

## 2017-08-20 NOTE — Telephone Encounter (Signed)
New Message  Pt c/o BP issue: STAT if pt c/o blurred vision, one-sided weakness or slurred speech  1. What are your last 5 BP readings? 224/68   2. Are you having any other symptoms (ex. Dizziness, headache, blurred vision, passed out)?   3. What is your BP issue? Pt states her bp is running high she mostly has top numbers 224,205, and 204

## 2017-08-21 NOTE — Telephone Encounter (Signed)
Needs to watch salt. Will address in office visit tomorrow

## 2017-08-22 ENCOUNTER — Encounter: Payer: Self-pay | Admitting: Cardiovascular Disease

## 2017-08-22 ENCOUNTER — Ambulatory Visit (INDEPENDENT_AMBULATORY_CARE_PROVIDER_SITE_OTHER): Payer: Medicare Other | Admitting: Cardiovascular Disease

## 2017-08-22 VITALS — BP 140/50 | HR 71 | Ht 65.0 in | Wt 254.1 lb

## 2017-08-22 DIAGNOSIS — M797 Fibromyalgia: Secondary | ICD-10-CM | POA: Diagnosis not present

## 2017-08-22 DIAGNOSIS — I10 Essential (primary) hypertension: Secondary | ICD-10-CM | POA: Diagnosis not present

## 2017-08-22 DIAGNOSIS — I5032 Chronic diastolic (congestive) heart failure: Secondary | ICD-10-CM | POA: Diagnosis not present

## 2017-08-22 NOTE — Patient Instructions (Signed)
Medication Instructions:  Your physician recommends that you continue on your current medications as directed. Please refer to the Current Medication list given to you today.   Labwork: TODAY - basic metabolic panel   Testing/Procedures: None Ordered   Follow-Up: Your physician wants you to follow-up in: 6 months with Dr. Nahser. You will receive a reminder letter in the mail two months in advance. If you don't receive a letter, please call our office to schedule the follow-up appointment.   If you need a refill on your cardiac medications before your next appointment, please call your pharmacy.   Thank you for choosing [facility]! Levester Waldridge, RN [phone]    

## 2017-08-22 NOTE — Progress Notes (Signed)
Patient Name: [name] Date of Encounter: 08/22/2017  Primary Care Provider:  Alroy Dust, L.Marlou Sa, MD Primary Cardiologist:  Joaquim Nam, MD  Problem List  1. CAD - s/p CABG Sep 22, 2015.  2. Hyperlipidemia 3. Obstructive sleep apnea 4. Hypertension 5. Atrial fib    April 46, 2356:  77 year old female with the above problem list.  She was in usual state of health about 3 weeks ago when she began to note mild lower extremity edema with bilateral ankle and calf pain with ambulation.  She takes her left leg has been more swollen than the right.  She has been relatively active recently partaking and gardening without any chest pain or dyspnea.  Her weight is actually down from baseline.  She's seen no change in her salt intake or amt of time that her legs might be in a dependent position.  She's quite flustered and anxious today.  She misplaced her phone and her dtr initially dropped her off @ the old [place] [facility] office and she had to walk down the street to here.  She doesn't know how to reach her dtr to have her come down and pick her up.  She's tearful and emotionally upset.  In that setting, after the visit, pt said that she thinks she's having chest pain.  An ecg was done, and before it was even completed, c/p resolved, as did her emotional upset.  Oct 08, 2012:  [name] is doing well. She's not had any episodes of chest pain or shortness of breath. She was seen by our PA last year. She has a history of coronary artery disease but has not had any recurrent angina. She's been working out in her garden without difficulties.  She has occasional episodes of orthostatic hypotension and also notes some occasional leg edema. I suspect this is from the amlodipine.  October 20, 2013:  [name] has seen [name] and was referred to [name] for management of her sleep apnea.   Her BP has been well controlled.  She needs to have a special dental appliance made for her OSA- did not tolerate  the CPAP. She has chronic dyspnea. No CP   She has found to have some thyroid issues.  Sees Dr. Alroy Dust for this.     December 29, 2014:  [name] is seen back today for evaulation of her CAD and HTN  And a recent episode of atrial fib. She was admitted and converted back to normal  ( ? No CP , BP has been well controlled    July 29, 2015:  Has chronic fatigue  Has had thyroid issues Doing well from a cardiac standpoint   Oct. 5, 2017:  [name] presented to the hospital with CP Sep 19, 2015. Cath showed prox LAD disease that was not amenable to PCI [name] had CABG  And MAZE procedure Sep 22, 2015. Complicated by a right radial hematoma - was eventually fixed on October 21, 2015   Nov. 15, 2017  [name] is doing well. She had coronary artery bypass grafting in May 2017. She has a history of atrial fibrillation and also had a MAZE  procedure with the coronary artery bypass grafting. We stopped her amiodarone at her last visit.  Feb. 26, 2018:  Doing ok Has had some leg swelling and BP is elevated.  Uses some salt .  No CP . Has had some DOE . Echo in Dec. 2017 shows normal LV systolic function with grade 2 Diastolic dysfunction  Sep 19, 2016: [name] is here to follow up with her CAD - she status post coronary artery bypass grafting ( LIMA to LAD + MAZE procedure )  in 2017. She had a Maze procedure at that time.  Is having some left leg edema .  trjied 3 days of double dose lasix - did not seem to help  Echo in Dec. Showed normal LV function with grade 2 DD.   Jan. 11, 2019 Seen today as a work in visit for CP . Occurs when she hiccups or swallow or if she turns her torso a certain way . Worse with eating / swallowing  Feels sore.   No angina pain that is similar to her pre-CABG pain  Has a tender and swollen left leg   August 22, 2017:   [name] is seen today for follow-up visit.  Her blood pressure has been markedly elevated recently. BP has been markedly elevated.  170 / 60  frequently  Has gained weight -   Wt is 254 lbs.  Today  Wt 248   On 05/31/17 Wt 222 lbs  On 09/19/16  Feels terrible.   More short of breath with any activity .  Hurts all aover with arthritis .   Living with friends and family .   Does not have a stable living situation .    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 Anxiety   . Coronary artery disease    a. s/p MI 2009 - PCI/DES distal  RCA w/ 2.75 x 18 Xience DES;  b. 05/2010 Myoview: Apical thinning, EF 73%;  c. 06/2010 Cath: moderate nonobs dzs, EF 65%;  d.  Lexiscan Myoview (07/2013):  No scar or ischemia, EF 67%; Normal Study  . Environmental allergies   . Essential iris atrophy    Right side  . Fatigue   . Fibromyalgia   . GERD (gastroesophageal reflux disease)   . Glaucoma   . Hyperlipidemia   . Hypertension   . Menopause   . Obesity (BMI 30-39.9)   . OSA (obstructive sleep apnea)    does not wear CPAP  . Osteoarthritis   . Oxygen dependent    3L   Past Surgical History:  Procedure Laterality Date  . CARDIAC CATHETERIZATION  06/29/2010   Mild to moderate coronary artery irregularities.  Her  proximal left anterior descending artery is moderately narrowed.  She  also has an eccentric stenosis in the proximal LAD.  These do not appear  to obstruct flow at present, but they are fairly small vessels.  They  appeared to be between 1.5 and 2 mm in diamete  . CARDIAC CATHETERIZATION  08/02/2009   Patent stent  . CARDIAC CATHETERIZATION  05/12/2008   Stent to the distal RCA  . CARDIAC CATHETERIZATION N/A 09/20/2015   Procedure: Left Heart Cath and Coronary Angiography;  Surgeon: Jettie Booze, MD;  Location: [facility];  Service: Cardiovascular;  Laterality: N/A;  . CARDIAC CATHETERIZATION  09/20/2015   Procedure: Intravascular Ultrasound/IVUS;  Surgeon:  Jettie Booze, MD;  Location: [facility];  Service: Cardiovascular;;  . CARDIAC CATHETERIZATION  09/20/2015   Procedure: Intravascular Pressure Wire/FFR Study;  Surgeon: Jettie Booze, MD;  Location: [facility];  Service: Cardiovascular;;  .  CLIPPING OF ATRIAL APPENDAGE N/A 09/23/2015   Procedure:  CLIPPING OF ATRIAL APPENDAGE;  Surgeon: Grace Isaac, MD;  Location: [facility];  Service: Open Heart Surgery;  Laterality: N/A;  . CORONARY ARTERY BYPASS GRAFT N/A 09/23/2015   Procedure: CORONARY ARTERY BYPASS GRAFTING (CABG) TIMES 1 USING LEFT INTERNAL MAMMARY TO THE LAD;  Surgeon: Grace Isaac, MD;  Location: [facility];  Service: Open Heart Surgery;  Laterality: N/A;  . MAZE N/A 09/23/2015   Procedure: MAZE;  Surgeon: Grace Isaac, MD;  Location: [facility];  Service: Open Heart Surgery;  Laterality: N/A;  . RESECTION OF ARTERIOVENOUS FISTULA ANEURYSM Right 10/21/2015   Procedure: SUTURE REPAIR OF RADIAL ARTERY AND EVACUATION OF HEMATOMA;  Surgeon: Mal Misty, MD;  Location: [facility];  Service: Vascular;  Laterality: Right;  . TEE WITHOUT CARDIOVERSION N/A 09/23/2015   Procedure: TRANSESOPHAGEAL ECHOCARDIOGRAM (TEE);  Surgeon: Grace Isaac, MD;  Location: [facility];  Service: Open Heart Surgery;  Laterality: N/A;    Allergies  Allergies  Allergen Reactions  . Other Nausea Only    NO MEAT!!  . Sulfamethoxazole-Trimethoprim Nausea Only  . Cortisone Nausea Only and Other (See Comments)    Pt gets very hot and flushed  . Latex Rash    Redness and irritation  . Tape Rash    Redness and irritation      Home Medications  Prior to Admission medications   Medication Sig Start Date End Date Taking? Authorizing Provider  acetaminophen (TYLENOL) 500 MG tablet Take 500 mg by mouth as needed.    Yes Historical Provider, MD  amitriptyline (ELAVIL) 10 MG tablet Take 10 mg by mouth at bedtime.   Yes Historical Provider, MD  amLODipine (NORVASC) 5 MG tablet Take 1 tablet (5 mg  total) by mouth daily. 09/03/11 09/02/12 Yes Thayer Headings, MD  aspirin 325 MG tablet Take 325 mg by mouth daily.     Yes Historical Provider, MD  carvedilol (COREG) 12.5 MG tablet Take 1 tablet (12.5 mg total) by mouth 2 (two) times daily. 09/03/11 09/02/12 Yes Thayer Headings, MD  clopidogrel (PLAVIX) 75 MG tablet TAKE ONE TABLET BY MOUTH DAILY 07/06/11  Yes Burtis Junes, NP  diclofenac sodium (VOLTAREN) 1 % GEL Apply topically. **CREAM** use as directed    Yes Historical Provider, MD  fluticasone (FLONASE) 50 MCG/ACT nasal spray Place 1 spray into the nose daily.   Yes Historical Provider, MD  hydrochlorothiazide 25 MG tablet Take 25 mg by mouth daily.    Yes Historical Provider, MD  HYDROcodone-homatropine (HYCODAN) 5-1.5 MG/5ML syrup Take 5 mLs by mouth every 8 (eight) hours as needed for cough. 08/29/11 09/08/11 Yes Robyn Haber, MD  IRON PO Take 1 tablet by mouth daily.     Yes Historical Provider, MD  levofloxacin (LEVAQUIN) 500 MG tablet Take 1 tablet (500 mg total) by mouth daily. 08/29/11 09/08/11 Yes Robyn Haber, MD  methimazole (TAPAZOLE) 5 MG tablet Take 5 mg by mouth daily.   Yes Historical Provider, MD  nitroGLYCERIN (NITROSTAT) 0.4 MG SL tablet Place 0.4 mg under the tongue every 5 (five) minutes as needed.     Yes Historical Provider, MD  pantoprazole (PROTONIX) 40 MG tablet Take 40 mg by mouth 2 (two) times daily.    Yes Historical Provider, MD  potassium chloride (KLOR-CON) 10 MEQ CR tablet Take 1 tablet (10 mEq total) by mouth daily. 02/23/11  Yes Thayer Headings, MD  pramipexole (MIRAPEX) 0.25 MG tablet Take 1  mg by mouth at bedtime.    Yes Historical Provider, MD  rosuvastatin (CRESTOR) 10 MG tablet Take 1 tablet (10 mg total) by mouth daily. 06/06/11  Yes Thayer Headings, MD  traMADol (ULTRAM) 50 MG tablet Take 50 mg by mouth 3 (three) times daily as needed.    Yes Historical Provider, MD  venlafaxine (EFFEXOR) 75 MG tablet Take 75 mg by mouth daily. 1/2 tablet 2 (two) times  daily   Yes Historical Provider, MD  lisinopril (PRINIVIL,ZESTRIL) 40 MG tablet Take 1 tablet (40 mg total) by mouth daily. 09/07/11 09/06/12  Thayer Headings, MD    Physical Exam: Blood pressure (!) 140/50, pulse 71, height 5\' 5"  (1.651 m), weight 254 lb 1.9 oz (115.3 kg), SpO2 98 %.  GEN:   Morbidly obese , middle age female  HEENT: Normal NECK: No JVD; No carotid bruits LYMPHATICS: No lymphadenopathy CARDIAC: RRR  , no significant murmur s RESPIRATORY:  Clear to auscultation without rales, wheezing or rhonchi  ABDOMEN: Soft, non-tender, non-distended MUSCULOSKELETAL:  No edema; No deformity  SKIN: Warm and dry NEUROLOGIC:  Alert and oriented x 3    ECG   Assessment and Plan   1. Chronic diastolic congestive heart failure.    Seems to be stable  2.  Marked HTN:   She is eating fast foods more frequently than she used to.  She is not able to eat her normal diet.  She is gained approximately 30 pounds since last year.  She also has sleep apnea. Getting a  dental appliance to help with the sleep apnea. Advised her to stay away from fast foods.  I will advise her to exercise on a regular basis. Will see her in 6 months  Needs to lose weight.  Eat better. Sleep better.   3.  Generalized fatigue: [name] has not been eating well or exercising regularly.  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  She is gained quite a bit of weight.  She also admits that she is not sleeping well.  I think that all of this is contributing to her generalized fatigue and fibromyalgia symptoms.  I recommended that she sleep better.  I recommended that she cut out the processed foods out of her diet.  Recommended that she work on an exercise program and lose weight.   3. CAD  3. Hyperlipidemia -     4. Obstructive sleep apnea-   Getting a dental device for her OSA   5. Hypertension - -   5. Atrial fib -stable for now       6. Left leg edema :  .     Mertie Moores, MD  08/22/2017 4:45 PM    [address],  [place], [place]  [address] Pager [phone] Phone: [phone]; Fax: [phone]

## 2017-08-23 ENCOUNTER — Other Ambulatory Visit: Payer: Self-pay | Admitting: Nurse Practitioner

## 2017-08-23 DIAGNOSIS — I10 Essential (primary) hypertension: Secondary | ICD-10-CM

## 2017-08-23 DIAGNOSIS — I2510 Atherosclerotic heart disease of native coronary artery without angina pectoris: Secondary | ICD-10-CM

## 2017-08-26 LAB — BASIC METABOLIC PANEL
BUN / CREAT RATIO: 20 (ref 12–28)
BUN: 23 mg/dL (ref 8–27)
CHLORIDE: 96 mmol/L (ref 96–106)
CO2: 26 mmol/L (ref 20–29)
Calcium: 9.7 mg/dL (ref 8.7–10.3)
Creatinine, Ser: 1.13 mg/dL — ABNORMAL HIGH (ref 0.57–1.00)
GFR calc non Af Amer: 48 mL/min/{1.73_m2} — ABNORMAL LOW (ref >59)
GFR, EST AFRICAN AMERICAN: 55 mL/min/{1.73_m2} — AB (ref >59)
GLUCOSE: 95 mg/dL (ref 65–99)
Potassium: 4.9 mmol/L (ref 3.5–5.2)
SODIUM: 135 mmol/L (ref 134–144)

## 2017-08-26 LAB — SPECIMEN STATUS REPORT

## 2017-08-27 ENCOUNTER — Encounter: Payer: Self-pay | Admitting: Physician Assistant

## 2017-08-27 ENCOUNTER — Ambulatory Visit (INDEPENDENT_AMBULATORY_CARE_PROVIDER_SITE_OTHER): Payer: Medicare Other | Admitting: Physician Assistant

## 2017-08-27 ENCOUNTER — Other Ambulatory Visit: Payer: Self-pay

## 2017-08-27 VITALS — BP 132/86 | HR 96 | Temp 98.7°F | Ht 65.5 in | Wt 250.0 lb

## 2017-08-27 DIAGNOSIS — R82998 Other abnormal findings in urine: Secondary | ICD-10-CM

## 2017-08-27 DIAGNOSIS — N3941 Urge incontinence: Secondary | ICD-10-CM

## 2017-08-27 DIAGNOSIS — R350 Frequency of micturition: Secondary | ICD-10-CM | POA: Diagnosis not present

## 2017-08-27 DIAGNOSIS — M25562 Pain in left knee: Secondary | ICD-10-CM | POA: Diagnosis not present

## 2017-08-27 LAB — POCT URINALYSIS DIP (MANUAL ENTRY)
BILIRUBIN UA: NEGATIVE
GLUCOSE UA: NEGATIVE mg/dL
Ketones, POC UA: NEGATIVE mg/dL
NITRITE UA: POSITIVE — AB
Protein Ur, POC: 30 mg/dL — AB
Spec Grav, UA: 1.01 (ref 1.010–1.025)
UROBILINOGEN UA: 0.2 U/dL
pH, UA: 5.5 (ref 5.0–8.0)

## 2017-08-27 MED ORDER — CEPHALEXIN 500 MG PO CAPS: 500.0000 mg | ORAL_CAPSULE | Freq: Two times a day (BID) | ORAL | 0 refills | Status: AC

## 2017-08-27 NOTE — Patient Instructions (Addendum)
Your results indicate you have a UTI. I have given you a prescription for an antibiotic. Please take with food. I have sent off a urine culture and we should have those results in 48 hours. If your symptoms worsen while you are awaiting these results or you develop fever, chills, flank pian, nausea and vomiting, please seek care immediately.   I have placed referral to pelvic floor PT and orthopedics. You should hear from them in 2 weeks. Thank you for letting me participate in your health and well being.   IF you received an x-ray today, you will receive an invoice from [facility]. Please contact [facility] at [phone] with questions or concerns regarding your invoice.   IF you received labwork today, you will receive an invoice from [facility]. Please contact [facility] at [phone] with questions or concerns regarding your invoice.   Our billing staff will not be able to assist you with questions regarding bills from these companies.  You will be contacted with the lab results as soon as they are available. The fastest way to get your results is to activate your My Chart account. Instructions are located on the last page of this paperwork. If you have not heard from [place] regarding the results in 2 weeks, please contact this office.      To learn more about antibiotic prescribing and use, visit [email or web address].

## 2017-08-27 NOTE — Progress Notes (Signed)
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  SUBJECTIVE  [name] is a 77 y.o. female who complains of dysuria, urinary frequency and urinary urgency x 2 days. Has some odor of urine.  She denies hematuria, flank pain, abdominal pain, genital rash, genital irritation and vaginal discharge. Has not tried with no relief. Used to see urology and was on maintenance therapy but ran out of it and stopped it. This is the first one she has had in one year. Has hx of urge and stress incontinence and thinks this is the culprit.   Has baker cyst of left posterior knee x 3 months. Has associated pain. Aggravated by sitting and she ubers all day for work. Relieved with cold compress and warm showers. Takes tramadol for fibromyalgia and arthritis but it does not really help. Has PMH of baker cyst in left knee and saw Raliegh Ip for this in the past. Has aspiration with success but then it returned. Has also had injections for the arthritis, which help. Would like new referral. Denies overlying redness, warmth, lower leg swelling, numbness, and tingling.   She  has a past medical history of Anxiety, Coronary artery disease, Environmental allergies, Essential iris atrophy, Fatigue, Fibromyalgia, GERD (gastroesophageal reflux disease), Glaucoma, Hyperlipidemia, Hypertension, Menopause, Obesity (BMI 30-39.9), OSA (obstructive sleep apnea), Osteoarthritis, and Oxygen dependent.    Medications reviewed and updated by myself where necessary, and exist elsewhere in the encounter.    [name] is allergic to other; sulfamethoxazole-trimethoprim; cortisone; latex; and tape. She  reports that she quit smoking about 56 years ago. Her smoking use included cigarettes. She has never used smokeless tobacco. She reports that she does not drink alcohol or use drugs. She  has no sexual activity history on file. 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  Her family history includes Breast cancer in her sister; Cancer in her father; Coronary artery disease in her brother, brother, brother, brother, and brother; Heart attack in her brother and mother.  Social History   Socioeconomic History  . Marital status: Divorced    Spouse name: Not on file  . Number of children: 2  . Years of education: 76  . Highest education level: Not on file  Occupational History  . Not on file  Social Needs  . Financial resource strain: Not on file  . Food insecurity:    Worry: Not on file    Inability: Not on file  . Transportation needs:    Medical: Not on file    Non-medical: Not on file  Tobacco Use  . Smoking status: Former Smoker    Types: Cigarettes    Last attempt to quit: 09/13/1960    Years since quitting: 56.9  . Smokeless tobacco: Never Used  Substance and Sexual Activity  . Alcohol use: No  . Drug use: No  .  Sexual activity: Not on file  Lifestyle  . Physical activity:    Days per week: Not on file    Minutes per session: Not on file  . Stress: Not on file  Relationships  . Social connections:    Talks on phone: Not on file     Gets together: Not on file    Attends religious service: Not on file    Active member of club or organization: Not on file    Attends meetings of clubs or organizations: Not on file    Relationship status: Not on file  . Intimate partner violence:    Fear of current or ex partner: Not on file    Emotionally abused: Not on file    Physically abused: Not on file    Forced sexual activity: Not on file  Other Topics Concern  . Not on file  Social History Narrative   Lives alone   Caffeine use: Tea sometimes   Right-handed    Review of Systems  Constitutional: Negative for chills and fever.  Gastrointestinal: Negative for nausea and vomiting.  Neurological: Negative for dizziness and headaches.    OBJECTIVE  Her  height is 5' 5.5" (1.664 m) and weight is 250 lb (113.4 kg). Her oral temperature is 98.7 F (37.1 C). Her blood pressure is 132/86 and her pulse is 96. Her oxygen saturation is 95%.  The patient's body mass index is 40.97 kg/m.  Physical Exam  Constitutional: She is oriented to person, place, and time. She appears well-developed and well-nourished.  HENT:  Head: Normocephalic and atraumatic.  Eyes: Conjunctivae are normal.  Neck: Normal range of motion.  Pulmonary/Chest: Effort normal.  Abdominal: Soft. Normal appearance. There is no tenderness. There is no CVA tenderness.  Musculoskeletal:       Left knee: She exhibits decreased range of motion. She exhibits no ecchymosis, no erythema and no bony tenderness. Tenderness ( with palpation of posterior knee, mild swelling noted in posterior knee, no palpable discrete mass noted, no overlying errythema or warmth) found.       Right lower leg: She exhibits no tenderness, no bony tenderness and no swelling.       Left lower leg: She exhibits no tenderness ( ), no bony tenderness and no swelling.  Neurological: She is alert and oriented to person, place, and time.  Skin: Skin is warm and dry.  Psychiatric: She has a normal  mood and affect.  Vitals reviewed.   Results for orders placed or performed in visit on 08/27/17 (from the past 24 hour(s))  POCT urinalysis dipstick     Status: Abnormal   Collection Time: 08/27/17  5:12 PM  Result Value Ref Range   Color, UA yellow yellow   Clarity, UA cloudy (A) clear   Glucose, UA negative negative mg/dL   Bilirubin, UA negative negative   Ketones, POC UA negative negative mg/dL   Spec Grav, UA 1.010 1.010 - 1.025   Blood, UA moderate (A) negative   pH, UA 5.5 5.0 - 8.0   Protein Ur, POC =30 (A) negative mg/dL   Urobilinogen, UA 0.2 0.2 or 1.0 E.U./dL   Nitrite, UA Positive (A) Negative   Leukocytes, UA Large (3+) (A) Negative    ASSESSMENT & PLAN  Shakti was seen today for back pain.  Diagnoses and all orders for this visit:  Urinary frequency -     POCT urinalysis dipstick -     Urine Culture  Leukocytes in urine -  cephALEXin (KEFLEX) 500 MG capsule; Take 1 capsule (500 mg total) by mouth 2 (two) times daily for 7 days. -     Urinalysis, microscopic only  Posterior left knee pain -     Ambulatory referral to Orthopedic Surgery  Urge incontinence -     Ambulatory referral to Physical Therapy   UA with +nitrites, leukocytes, and moderate blood. Urinalysis micro and urine cx pending. Will tx empirically at this time with keflex, most recent BMP on 08/22/17 with GFR of 48. Will refer to pelvic floor PT for urge/stress incontinence and orthopedics for knee pain, as she will likely need injections/aspiration again. The patient was advised to call or come back to clinic if she does not see an improvement in symptoms, or worsens with the above plan.   Tenna Delaine, PA-C  [facility] 08/27/2017 6:19 PM

## 2017-08-28 LAB — URINALYSIS, MICROSCOPIC ONLY: CASTS: NONE SEEN /LPF

## 2017-08-29 LAB — URINE CULTURE

## 2017-09-02 ENCOUNTER — Ambulatory Visit: Payer: Medicare Other | Admitting: Physician Assistant

## 2017-09-12 ENCOUNTER — Ambulatory Visit: Payer: Medicare Other | Admitting: Physician Assistant

## 2017-09-20 ENCOUNTER — Telehealth: Payer: Self-pay | Admitting: Cardiovascular Disease

## 2017-09-20 NOTE — Telephone Encounter (Signed)
Spoke with patient who was very tearful on the phone in regards to her bilateral LLE and SOB.    She has been recently diagnosed with cellulitis, but her SOB has increased and she just feels "terrible".    She says that her symptoms have escalated since her last OV.  She has OSA and is not using a sleep device.  I informed her to weigh herself over the weekend and give [place] an update on Monday.  Please advise

## 2017-09-20 NOTE — Telephone Encounter (Signed)
New Message   Pt c/o swelling: STAT is pt has developed SOB within 24 hours  1) How much weight have you gained and in what time span? Unknowm  2) If swelling, where is the swelling located? Lower leg and ankle  3) Are you currently taking a fluid pill? Yes, lasix  4) Are you currently SOB? No currently but at night experiences shortness of breath when she lay down  5) Do you have a log of your daily weights (if so, list)? no  6) Have you gained 3 pounds in a day or 5 pounds in a week? Unknown  7) Have you traveled recently? No

## 2017-09-23 NOTE — Telephone Encounter (Signed)
Patient returned my call and reports BP 143/51 mmHg, 77 bpm at [facility] on Saturday. She states she has not been checking daily weight or BP and HR. States she is concerned about wheezing, hoarseness, sinus congestion and coughing and she becomes tearful. She states she is going to go to her PCP for a chest xray because she thinks she may have URI or pneumonia. She states she knows that she needs to start exercising but she cannot walk very far without having trouble breathing; states she is "miserable." I advised that if she thinks her symptoms are respiratory that she follow-up with [place] if needed after seeing her PCP. She verbalized understanding and agreement and thanked me for the call.

## 2017-09-23 NOTE — Telephone Encounter (Signed)
I saw [name] about a month ago.  She has gained 30 pounds.  She has been eating fast food on a regular basis and is been driving for [employer]  She spends a lot of time in a seated position.  I suspect that this has led to her acute on chronic diastolic congestive heart failure.  We can increase her Lasix to 40 mg twice a day for 3 or 4 days and see if that helps. Her potassium levels have been fine.  The real solution to this is for her to greatly improve her diet and to be able to elevate her legs periodically through the day.  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

## 2017-09-23 NOTE — Telephone Encounter (Signed)
Left message for patient to call back  

## 2017-09-28 ENCOUNTER — Encounter (HOSPITAL_COMMUNITY): Payer: Self-pay | Admitting: *Deleted

## 2017-09-28 ENCOUNTER — Emergency Department (HOSPITAL_COMMUNITY): Payer: Medicare Other

## 2017-09-28 ENCOUNTER — Other Ambulatory Visit: Payer: Self-pay

## 2017-09-28 ENCOUNTER — Inpatient Hospital Stay (HOSPITAL_COMMUNITY)
Admission: EM | Admit: 2017-09-28 | Discharge: 2017-09-30 | DRG: 292 | Disposition: A | Discharge status: Home / Self Care | Payer: Medicare Other | Attending: Internal Medicine | Admitting: Internal Medicine

## 2017-09-28 ENCOUNTER — Other Ambulatory Visit: Payer: Self-pay | Admitting: Cardiovascular Disease

## 2017-09-28 DIAGNOSIS — I509 Heart failure, unspecified: Secondary | ICD-10-CM

## 2017-09-28 DIAGNOSIS — I110 Hypertensive heart disease with heart failure: Principal | ICD-10-CM | POA: Diagnosis present

## 2017-09-28 DIAGNOSIS — K219 Gastro-esophageal reflux disease without esophagitis: Secondary | ICD-10-CM | POA: Diagnosis present

## 2017-09-28 DIAGNOSIS — E785 Hyperlipidemia, unspecified: Secondary | ICD-10-CM | POA: Diagnosis present

## 2017-09-28 DIAGNOSIS — Z8249 Family history of ischemic heart disease and other diseases of the circulatory system: Secondary | ICD-10-CM

## 2017-09-28 DIAGNOSIS — Z79891 Long term (current) use of opiate analgesic: Secondary | ICD-10-CM

## 2017-09-28 DIAGNOSIS — M1990 Unspecified osteoarthritis, unspecified site: Secondary | ICD-10-CM | POA: Diagnosis present

## 2017-09-28 DIAGNOSIS — Z7901 Long term (current) use of anticoagulants: Secondary | ICD-10-CM | POA: Diagnosis not present

## 2017-09-28 DIAGNOSIS — Z9981 Dependence on supplemental oxygen: Secondary | ICD-10-CM | POA: Diagnosis not present

## 2017-09-28 DIAGNOSIS — Z882 Allergy status to sulfonamides status: Secondary | ICD-10-CM | POA: Diagnosis not present

## 2017-09-28 DIAGNOSIS — H409 Unspecified glaucoma: Secondary | ICD-10-CM | POA: Diagnosis present

## 2017-09-28 DIAGNOSIS — I2510 Atherosclerotic heart disease of native coronary artery without angina pectoris: Secondary | ICD-10-CM | POA: Diagnosis present

## 2017-09-28 DIAGNOSIS — R0602 Shortness of breath: Secondary | ICD-10-CM | POA: Diagnosis not present

## 2017-09-28 DIAGNOSIS — Z91040 Latex allergy status: Secondary | ICD-10-CM

## 2017-09-28 DIAGNOSIS — I5033 Acute on chronic diastolic (congestive) heart failure: Secondary | ICD-10-CM | POA: Diagnosis present

## 2017-09-28 DIAGNOSIS — E669 Obesity, unspecified: Secondary | ICD-10-CM | POA: Diagnosis present

## 2017-09-28 DIAGNOSIS — Z91048 Other nonmedicinal substance allergy status: Secondary | ICD-10-CM | POA: Diagnosis not present

## 2017-09-28 DIAGNOSIS — I10 Essential (primary) hypertension: Secondary | ICD-10-CM | POA: Diagnosis present

## 2017-09-28 DIAGNOSIS — I501 Left ventricular failure: Secondary | ICD-10-CM | POA: Diagnosis not present

## 2017-09-28 DIAGNOSIS — R040 Epistaxis: Secondary | ICD-10-CM | POA: Diagnosis present

## 2017-09-28 DIAGNOSIS — E041 Nontoxic single thyroid nodule: Secondary | ICD-10-CM | POA: Diagnosis present

## 2017-09-28 DIAGNOSIS — F419 Anxiety disorder, unspecified: Secondary | ICD-10-CM | POA: Diagnosis present

## 2017-09-28 DIAGNOSIS — I480 Paroxysmal atrial fibrillation: Secondary | ICD-10-CM | POA: Diagnosis present

## 2017-09-28 DIAGNOSIS — I252 Old myocardial infarction: Secondary | ICD-10-CM

## 2017-09-28 DIAGNOSIS — D649 Anemia, unspecified: Secondary | ICD-10-CM

## 2017-09-28 DIAGNOSIS — Z803 Family history of malignant neoplasm of breast: Secondary | ICD-10-CM | POA: Diagnosis not present

## 2017-09-28 DIAGNOSIS — Z87891 Personal history of nicotine dependence: Secondary | ICD-10-CM | POA: Diagnosis not present

## 2017-09-28 DIAGNOSIS — G4733 Obstructive sleep apnea (adult) (pediatric): Secondary | ICD-10-CM | POA: Diagnosis present

## 2017-09-28 DIAGNOSIS — M797 Fibromyalgia: Secondary | ICD-10-CM | POA: Diagnosis present

## 2017-09-28 DIAGNOSIS — I4891 Unspecified atrial fibrillation: Secondary | ICD-10-CM | POA: Diagnosis present

## 2017-09-28 DIAGNOSIS — Z79890 Hormone replacement therapy (postmenopausal): Secondary | ICD-10-CM

## 2017-09-28 DIAGNOSIS — D509 Iron deficiency anemia, unspecified: Secondary | ICD-10-CM | POA: Diagnosis present

## 2017-09-28 DIAGNOSIS — Z6841 Body Mass Index (BMI) 40.0 and over, adult: Secondary | ICD-10-CM | POA: Diagnosis not present

## 2017-09-28 DIAGNOSIS — Z951 Presence of aortocoronary bypass graft: Secondary | ICD-10-CM

## 2017-09-28 LAB — BASIC METABOLIC PANEL
ANION GAP: 8 (ref 5–15)
BUN: 18 mg/dL (ref 6–20)
CO2: 24 mmol/L (ref 22–32)
Calcium: 9.4 mg/dL (ref 8.9–10.3)
Chloride: 103 mmol/L (ref 101–111)
Creatinine, Ser: 1.19 mg/dL — ABNORMAL HIGH (ref 0.44–1.00)
GFR calc Af Amer: 50 mL/min — ABNORMAL LOW (ref >60)
GFR, EST NON AFRICAN AMERICAN: 44 mL/min — AB (ref >60)
Glucose, Bld: 121 mg/dL — ABNORMAL HIGH (ref 65–99)
POTASSIUM: 4.6 mmol/L (ref 3.5–5.1)
SODIUM: 135 mmol/L (ref 135–145)

## 2017-09-28 LAB — RETICULOCYTES
RBC.: 3.29 MIL/uL — AB (ref 3.87–5.11)
RETIC COUNT ABSOLUTE: 49.4 10*3/uL (ref 19.0–186.0)
Retic Ct Pct: 1.5 % (ref 0.4–3.1)

## 2017-09-28 LAB — TYPE AND SCREEN
ABO/RH(D): O POS
ANTIBODY SCREEN: NEGATIVE

## 2017-09-28 LAB — IRON AND TIBC
Iron: 19 ug/dL — ABNORMAL LOW (ref 28–170)
Saturation Ratios: 4 % — ABNORMAL LOW (ref 10.4–31.8)
TIBC: 468 ug/dL — ABNORMAL HIGH (ref 250–450)
UIBC: 449 ug/dL

## 2017-09-28 LAB — I-STAT TROPONIN, ED: Troponin i, poc: 0.01 ng/mL (ref 0.00–0.08)

## 2017-09-28 LAB — POC OCCULT BLOOD, ED: Fecal Occult Bld: NEGATIVE

## 2017-09-28 LAB — BRAIN NATRIURETIC PEPTIDE: B NATRIURETIC PEPTIDE 5: 446.4 pg/mL — AB (ref 0.0–100.0)

## 2017-09-28 LAB — CBC
HEMATOCRIT: 26.9 % — AB (ref 36.0–46.0)
HEMOGLOBIN: 8.3 g/dL — AB (ref 12.0–15.0)
MCH: 25.8 pg — ABNORMAL LOW (ref 26.0–34.0)
MCHC: 30.9 g/dL (ref 30.0–36.0)
MCV: 83.5 fL (ref 78.0–100.0)
Platelets: 274 10*3/uL (ref 150–400)
RBC: 3.22 MIL/uL — ABNORMAL LOW (ref 3.87–5.11)
RDW: 14.7 % (ref 11.5–15.5)
WBC: 6.5 10*3/uL (ref 4.0–10.5)

## 2017-09-28 LAB — MRSA PCR SCREENING: MRSA by PCR: NEGATIVE

## 2017-09-28 LAB — FOLATE: FOLATE: 44 ng/mL (ref >5.9)

## 2017-09-28 LAB — FERRITIN: Ferritin: 13 ng/mL (ref 11–307)

## 2017-09-28 LAB — VITAMIN B12: VITAMIN B 12: 559 pg/mL (ref 180–914)

## 2017-09-28 MED ORDER — FUROSEMIDE 20 MG PO TABS: 40.0000 mg | ORAL_TABLET | Freq: Every day | ORAL | Status: DC

## 2017-09-28 MED ORDER — FUROSEMIDE 10 MG/ML IJ SOLN
40.0000 mg | Freq: Two times a day (BID) | INTRAMUSCULAR | Status: DC
  Administered 2017-09-29: 40 mg via INTRAVENOUS
  Filled 2017-09-28: qty 4

## 2017-09-28 MED ORDER — BISACODYL 10 MG RE SUPP: 10.0000 mg | Freq: Every day | RECTAL | Status: DC | PRN

## 2017-09-28 MED ORDER — LISINOPRIL 5 MG PO TABS
5.0000 mg | ORAL_TABLET | Freq: Every day | ORAL | Status: DC
  Administered 2017-09-28 – 2017-09-29 (×2): 5 mg via ORAL
  Filled 2017-09-28: qty 1
  Filled 2017-09-28: qty 2

## 2017-09-28 MED ORDER — PRAMIPEXOLE DIHYDROCHLORIDE 1 MG PO TABS
1.0000 mg | ORAL_TABLET | Freq: Every day | ORAL | Status: DC
  Administered 2017-09-28 – 2017-09-29 (×2): 1 mg via ORAL
  Filled 2017-09-28 (×2): qty 1

## 2017-09-28 MED ORDER — VENLAFAXINE HCL 37.5 MG PO TABS
37.5000 mg | ORAL_TABLET | Freq: Two times a day (BID) | ORAL | Status: DC
Start: 2017-09-28 — End: 2017-09-30
  Administered 2017-09-28 – 2017-09-30 (×4): 37.5 mg via ORAL
  Filled 2017-09-28 (×4): qty 1

## 2017-09-28 MED ORDER — PANTOPRAZOLE SODIUM 40 MG PO TBEC
40.0000 mg | DELAYED_RELEASE_TABLET | Freq: Two times a day (BID) | ORAL | Status: DC
  Administered 2017-09-28 – 2017-09-30 (×4): 40 mg via ORAL
  Filled 2017-09-28 (×4): qty 1

## 2017-09-28 MED ORDER — METOPROLOL TARTRATE 25 MG PO TABS
25.0000 mg | ORAL_TABLET | Freq: Two times a day (BID) | ORAL | Status: DC
  Administered 2017-09-28 – 2017-09-30 (×4): 25 mg via ORAL
  Filled 2017-09-28 (×4): qty 1

## 2017-09-28 MED ORDER — FERROUS SULFATE 325 (65 FE) MG PO TABS
325.0000 mg | ORAL_TABLET | Freq: Every morning | ORAL | Status: DC
  Filled 2017-09-28: qty 1

## 2017-09-28 MED ORDER — ROSUVASTATIN CALCIUM 10 MG PO TABS
5.0000 mg | ORAL_TABLET | Freq: Every day | ORAL | Status: DC
  Administered 2017-09-29 – 2017-09-30 (×2): 5 mg via ORAL
  Filled 2017-09-28 (×2): qty 1

## 2017-09-28 MED ORDER — FUROSEMIDE 10 MG/ML IJ SOLN
20.0000 mg | Freq: Once | INTRAMUSCULAR | Status: AC
  Administered 2017-09-28: 20 mg via INTRAVENOUS
  Filled 2017-09-28: qty 2

## 2017-09-28 MED ORDER — HYDRALAZINE HCL 50 MG PO TABS
25.0000 mg | ORAL_TABLET | Freq: Three times a day (TID) | ORAL | Status: DC
  Administered 2017-09-28 – 2017-09-29 (×2): 25 mg via ORAL
  Filled 2017-09-28 (×2): qty 1

## 2017-09-28 MED ORDER — LEVOTHYROXINE SODIUM 75 MCG PO TABS
75.0000 ug | ORAL_TABLET | Freq: Every day | ORAL | Status: DC
  Administered 2017-09-29 – 2017-09-30 (×2): 75 ug via ORAL
  Filled 2017-09-28 (×3): qty 1

## 2017-09-28 MED ORDER — FUROSEMIDE 10 MG/ML IJ SOLN
40.0000 mg | Freq: Once | INTRAMUSCULAR | Status: AC
  Administered 2017-09-28: 40 mg via INTRAVENOUS
  Filled 2017-09-28: qty 4

## 2017-09-28 MED ORDER — IPRATROPIUM-ALBUTEROL 0.5-2.5 (3) MG/3ML IN SOLN
3.0000 mL | Freq: Once | RESPIRATORY_TRACT | Status: AC
  Administered 2017-09-28: 3 mL via RESPIRATORY_TRACT
  Filled 2017-09-28: qty 3

## 2017-09-28 MED ORDER — APIXABAN 5 MG PO TABS
5.0000 mg | ORAL_TABLET | Freq: Two times a day (BID) | ORAL | Status: DC
  Administered 2017-09-28 – 2017-09-30 (×4): 5 mg via ORAL
  Filled 2017-09-28 (×5): qty 1

## 2017-09-28 MED ORDER — NITROGLYCERIN 0.4 MG SL SUBL: 0.4000 mg | SUBLINGUAL_TABLET | SUBLINGUAL | Status: DC | PRN

## 2017-09-28 NOTE — ED Notes (Signed)
Pt ambulated to bathroom independent.y

## 2017-09-28 NOTE — ED Notes (Signed)
ORDERED DIET TRAY FOR PT  

## 2017-09-28 NOTE — ED Provider Notes (Signed)
[facility] EMERGENCY DEPARTMENT Provider Note   CSN: [id number] Arrival date & time: 09/28/17  1059     History   Chief Complaint Chief Complaint  Patient presents with  . Shortness of Breath    HPI [name] is a 77 y.o. female with a history of CAD status post CABG, hypertension, hyperlipidemia, fibromyalgia, atrial fibrillation (on eliquis) who presents emergency department today for shortness of breath.  Patient states that she has had ongoing shortness of breath for some time now.  She states of the last several weeks it has been getting worse and staying with a friend at their place.  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  She reports that she has had increasing short of breath with less exertion.  She was able to do activities of daily living without shortness of breath several weeks ago but now was not able to make her bed without becoming short of breath.  She notes that she has orthopnea and places bricks underneath her headboard in order to prop her head up.  She still sleeps with 2 pillows.  She reports she has a ongoing cough that is productive with clear/white sputum.  She notes she has had increasing swelling in her bilateral lower extremities.  She notes she is usually compliant with her Lasix medication but did not take it today because she was going into town with a friend and did not want to have to use the restroom frequently.  Patient is followed by heart care, Dr. Cathie Olden.  Patient's last echocardiogram was on 04/27/2016 which showed EF of 65-70% with grade 2 diastolic dysfunction.  Patient denies any fever, chills, chest pain, nausea, vomiting, abdominal pain or any other symptoms at this time. Patient is a previous smoker but quit at the age of 58. Baseline weight was 222 on 09/19/16 but has fluctuated up to 248 earlier this year. She weighs 250 today.   HPI  Past  Medical History:  Diagnosis Date  . Anxiety   . Coronary artery disease    a. s/p MI 2009 - PCI/DES distal  RCA w/ 2.75 x 18 Xience DES;  b. 05/2010 Myoview: Apical thinning, EF 73%;  c. 06/2010 Cath: moderate nonobs dzs, EF 65%;  d.  Lexiscan Myoview (07/2013):  No scar or ischemia, EF 67%; Normal Study  . Environmental allergies   . Essential iris atrophy    Right side  . Fatigue   . Fibromyalgia   . GERD (gastroesophageal reflux disease)   . Glaucoma   . Hyperlipidemia   . Hypertension   . Menopause   . Obesity (BMI 30-39.9)   . OSA (obstructive sleep apnea)    does not wear CPAP  . Osteoarthritis   . Oxygen dependent    3L    Patient Active Problem List   Diagnosis Date Noted  . Right arm pain 09/26/2016  . Depression 04/29/2016  . Chronic fatigue syndrome 04/29/2016  . Aneurysm of right radial artery ([place]) 11/01/2015  . Anxiety state 10/03/2015  . CAD (coronary artery disease) 09/23/2015  . Atrial fibrillation ([place]) 12/19/2014  . Multiple thyroid nodules 01/12/2014  . OSA (obstructive sleep apnea)   . Obesity (BMI 30-39.9)   . Sleep apnea 06/22/2013  . Fibromyalgia   . Coronary artery disease   . Hypertension   . Hyperlipidemia   . Fatigue   . History of  myocardial infarction     Past Surgical History:  Procedure Laterality Date  . CARDIAC CATHETERIZATION  06/29/2010   Mild to moderate coronary artery irregularities.  Her  proximal left anterior descending artery is moderately narrowed.  She  also has an eccentric stenosis in the proximal LAD.  These do not appear  to obstruct flow at present, but they are fairly small vessels.  They  appeared to be between 1.5 and 2 mm in diamete  . CARDIAC CATHETERIZATION  08/02/2009   Patent stent  . CARDIAC CATHETERIZATION  05/12/2008   Stent to the distal RCA  . CARDIAC CATHETERIZATION N/A 09/20/2015   Procedure: Left Heart Cath and Coronary Angiography;  Surgeon: Jettie Booze, MD;  Location: [facility];  Service:  Cardiovascular;  Laterality: N/A;  . CARDIAC CATHETERIZATION  09/20/2015   Procedure: Intravascular Ultrasound/IVUS;  Surgeon: Jettie Booze, MD;  Location: [facility];  Service: Cardiovascular;;  . CARDIAC CATHETERIZATION  09/20/2015   Procedure: Intravascular Pressure Wire/FFR Study;  Surgeon: Jettie Booze, MD;  Location: [facility];  Service: Cardiovascular;;  . CLIPPING OF ATRIAL APPENDAGE N/A 09/23/2015   Procedure:  CLIPPING OF ATRIAL APPENDAGE;  Surgeon: Grace Isaac, MD;  Location: [facility];  Service: Open Heart Surgery;  Laterality: N/A;  . CORONARY ARTERY BYPASS GRAFT N/A 09/23/2015   Procedure: CORONARY ARTERY BYPASS GRAFTING (CABG) TIMES 1 USING LEFT INTERNAL MAMMARY TO THE LAD;  Surgeon: Grace Isaac, MD;  Location: [facility];  Service: Open Heart Surgery;  Laterality: N/A;  . MAZE N/A 09/23/2015   Procedure: MAZE;  Surgeon: Grace Isaac, MD;  Location: [facility];  Service: Open Heart Surgery;  Laterality: N/A;  . RESECTION OF ARTERIOVENOUS FISTULA ANEURYSM Right 10/21/2015   Procedure: SUTURE REPAIR OF RADIAL ARTERY AND EVACUATION OF HEMATOMA;  Surgeon: Mal Misty, MD;  Location: [facility];  Service: Vascular;  Laterality: Right;  . TEE WITHOUT CARDIOVERSION N/A 09/23/2015   Procedure: TRANSESOPHAGEAL ECHOCARDIOGRAM (TEE);  Surgeon: Grace Isaac, MD;  Location: [facility];  Service: Open Heart Surgery;  Laterality: N/A;     OB History   None      Home Medications    Prior to Admission medications   Medication Sig Start Date End Date Taking? Authorizing Provider  aspirin EC 81 MG tablet Take 1 tablet (81 mg total) by mouth daily. 05/31/17  Yes Nahser, Wonda Cheng, MD  ELIQUIS 5 MG TABS tablet TAKE 1 TABLET(5 MG) BY MOUTH TWICE DAILY 07/22/17  Yes Nahser, Wonda Cheng, MD  ferrous sulfate 325 (65 FE) MG tablet Take 325 mg by mouth every morning.    Yes [provider]  furosemide (LASIX) 40 MG tablet Take 1 tablet (40 mg total) by mouth daily. 03/07/16  Yes  Nahser, Wonda Cheng, MD  levothyroxine (SYNTHROID, LEVOTHROID) 75 MCG tablet Take 1 tablet (75 mcg total) by mouth daily before breakfast. 06/01/17  Yes Tereasa Coop, PA-C  lisinopril (PRINIVIL,ZESTRIL) 20 MG tablet TAKE 1 TABLET(20 MG) BY MOUTH DAILY 06/03/17  Yes Nahser, Wonda Cheng, MD  metoprolol tartrate (LOPRESSOR) 25 MG tablet Take 1 tablet (25 mg total) by mouth 2 (two) times daily. 02/11/17  Yes Nahser, Wonda Cheng, MD  pantoprazole (PROTONIX) 40 MG tablet Take 40 mg by mouth 2 (two) times daily.  01/07/14  Yes [provider]  pramipexole (MIRAPEX) 1 MG tablet Take 1-3 mg by mouth at bedtime.    Yes [provider]  traMADol (  ULTRAM) 50 MG tablet Take one tablet by mouth every 4 hours as needed for moderate pain; Take two tablets by mouth every 4 hours as needed for severe pain 10/06/15  Yes Reed, Tiffany L, DO  venlafaxine (EFFEXOR) 37.5 MG tablet Take 37.5 mg by mouth 2 (two) times daily.   Yes [provider]  nitroGLYCERIN (NITROSTAT) 0.4 MG SL tablet Place 1 tablet (0.4 mg total) under the tongue every 5 (five) minutes as needed for chest pain. 02/23/16 11/05/16  Nahser, Wonda Cheng, MD  rosuvastatin (CRESTOR) 5 MG tablet Take 1 tablet (5 mg total) daily by mouth. 03/27/17 06/25/17  Nahser, Wonda Cheng, MD    Family History Family History  Problem Relation Age of Onset  . Heart attack Mother   . Cancer Father   . Coronary artery disease Brother        had CABG  . Coronary artery disease Brother   . Coronary artery disease Brother   . Coronary artery disease Brother   . Coronary artery disease Brother   . Heart attack Brother   . Breast cancer Sister     Social History Social History   Tobacco Use  . Smoking status: Former Smoker    Types: Cigarettes    Last attempt to quit: 09/13/1960    Years since quitting: 57.0  . Smokeless tobacco: Never Used  Substance Use Topics  . Alcohol use: No  . Drug use: No     Allergies   Other;  Sulfamethoxazole-trimethoprim; Cortisone; Latex; and Tape   Review of Systems Review of Systems  All other systems reviewed and are negative.    Physical Exam Updated Vital Signs BP (!) 177/54 (BP Location: Right Arm)   Pulse 71   Temp 97.8 F (36.6 C) (Oral)   Resp 20   SpO2 100%   Physical Exam  Constitutional: She appears well-developed and well-nourished.  HENT:  Head: Normocephalic and atraumatic.  Right Ear: External ear normal.  Left Ear: External ear normal.  Nose: Nose normal.  Mouth/Throat: Uvula is midline, oropharynx is clear and moist and mucous membranes are normal. No tonsillar exudate.  Eyes: Right eye exhibits no discharge. Left eye exhibits no discharge. No scleral icterus.  Right eye with prior surgical repair changes noted  Neck: Trachea normal. Neck supple. Hepatojugular reflux present. No JVD present. No spinous process tenderness present. Carotid bruit is not present. No neck rigidity. Normal range of motion present.  Cardiovascular: Normal rate, regular rhythm and intact distal pulses.  No murmur heard. Pulses:      Radial pulses are 2+ on the right side, and 2+ on the left side.       Dorsalis pedis pulses are 2+ on the right side, and 2+ on the left side.       Posterior tibial pulses are 2+ on the right side, and 2+ on the left side.  1+ b/l lower extremity edema to level of mid shin  Pulmonary/Chest: Effort normal. She has decreased breath sounds in the right lower field and the left lower field. She exhibits no tenderness.  She is satting at 100% on room air with good waveform on monitor.  Abdominal: Soft. Bowel sounds are normal. There is no tenderness. There is no rebound and no guarding.  Musculoskeletal: She exhibits no edema.  Lymphadenopathy:    She has no cervical adenopathy.  Neurological: She is alert.  Speech clear. Grossly moves all extremities 4 without ataxia.   Skin: Skin is warm and dry.  No rash noted. She is not diaphoretic.    Psychiatric: She has a normal mood and affect.  Nursing note and vitals reviewed.    ED Treatments / Results  Labs (all labs ordered are listed, but only abnormal results are displayed) Labs Reviewed  BASIC METABOLIC PANEL - Abnormal; Notable for the following components:      Result Value   Glucose, Bld 121 (*)    Creatinine, Ser 1.19 (*)    GFR calc non Af Amer 44 (*)    GFR calc Af Amer 50 (*)    All other components within normal limits  CBC - Abnormal; Notable for the following components:   RBC 3.22 (*)    Hemoglobin 8.3 (*)    HCT 26.9 (*)    MCH 25.8 (*)    All other components within normal limits  BRAIN NATRIURETIC PEPTIDE  I-STAT TROPONIN, ED    EKG EKG Interpretation  Date/Time:  Saturday Sep 28 2017 11:52:05 EDT Ventricular Rate:  74 PR Interval:  166 QRS Duration: 86 QT Interval:  404 QTC Calculation: 448 R Axis:   91 Text Interpretation:  Normal sinus rhythm Rightward axis Cannot rule out Inferior infarct , age undetermined Abnormal ECG When comapred to prior, no signifncant changes seen.  No STEMI Confirmed by Antony Blackbird [phone]) on 09/28/2017 3:20:06 PM   Radiology Dg Chest 2 View  Result Date: 09/28/2017 CLINICAL DATA:  Shortness of breath EXAM: CHEST - 2 VIEW COMPARISON:  February 16, 2017 FINDINGS: The mediastinal contour is normal. The heart size is enlarged. There is pulmonary edema. There is no focal pneumonia or pleural effusion. The visualized skeletal structures are unremarkable. IMPRESSION: Congestive heart failure. Electronically Signed   By: Abelardo Diesel M.D.   On: 09/28/2017 12:53    Procedures Procedures (including critical care time)  Medications Ordered in ED Medications  furosemide (LASIX) injection 40 mg (40 mg Intravenous Given 09/28/17 1627)  ipratropium-albuterol (DUONEB) 0.5-2.5 (3) MG/3ML nebulizer solution 3 mL (3 mLs Nebulization Given 09/28/17 1622)     Initial Impression / Assessment and Plan / ED Course  I have  reviewed the triage vital signs and the nursing notes.  Pertinent labs & imaging results that were available during my care of the patient were reviewed by me and considered in my medical decision making (see chart for details).     77 y.o. female with a history of CAD status post CABG, hypertension, hyperlipidemia, atrial fibrillation (on eliquis) who presents emergency department today for shortness of breath brought on by minimal exertion including activities of daily living as well as orthopnea (patient placed brick underneath headboard in order to raise bed).  She notes increased salt and water intake.  She is currently on Lasix and is relatively compliant with this but has not taken it today.  She notes she has a cough that is productive with clear/white sputum. Patient is followed by heart care, Dr. Cathie Olden.  Patient's last echocardiogram was on 04/27/2016 which showed EF of 65-70% with grade 2 diastolic dysfunction.  No fevers or chest pain at home.  Baseline weight was 222 on 09/19/16 but has fluctuated up to 248 earlier this year. She weighs 250 today.  Patient's vital signs are reassuring on presentation.  Patient is without hypoxia.  Patient is noted to have decreased breath sounds at bilateral bases.  She also has a Tory wheeze.  Will give DuoNeb treatment.  EKG is unchanged from prior.  No evidence of STEMI.  Chest x-ray  with evidence of congestive heart failure with cardiomegaly as well as pulmonary edema. Reviewed patient's last chest x-ray was on 08/09/2016 it did not show any evidence of CHF. Will start patient on IV Lasix.  Patient is also noted to have hemoglobin of 8.3.  This is decreased from 12.0 since 02/16/2017.  Patient does report that she is short of breath with exertion as described above.  She denies any dizziness upon standing.  No melena or hematochezia.  Rectal exam performed and without any gross melena or hematochezia.  Occult cards sent and negative. Patient is on eliqus.  Patient with negative Tn.   Patient has diuresed x1.  Will admit for CHF and anemia. BNP pending.   Appreciate the Dr. Candiss Norse for bringing the patient in for admission. Patient appears stable for admission.  Final Clinical Impressions(s) / ED Diagnoses   Final diagnoses:  Pulmonary edema with congestive heart failure (HCC)  Anemia, unspecified type    ED Discharge Orders    None       Jillyn Ledger, Hershal Coria 09/28/17 1744    Tegeler, Gwenyth Allegra, MD 09/30/17 [id number]

## 2017-09-28 NOTE — ED Notes (Signed)
Admitting at bedside 

## 2017-09-28 NOTE — ED Triage Notes (Signed)
Pt is here with shortness of breath and just feels like she cannot make it. No chest pain.  Recently getting over cellulitis to LE and reports she tripped on rug in her friends bedroom and has pain to right knee. Pt concerned causes she has been having wheezing too

## 2017-09-28 NOTE — H&P (Signed)
TRH H&P   Patient Demographics:    [name], is a 77 y.o. female  MRN: [id number]   DOB - 02-Dec-1940  Admit Date - 09/28/2017  Outpatient Primary MD for the patient is Alroy Dust, L.Marlou Sa, MD  Outpatient Specialists - Dr Acie Fredrickson    Patient coming from: Home  Chief Complaint  Patient presents with  . Shortness of Breath      HPI:    [name]  is a 77 y.o. female, with history of chronic diastolic CHF EF 53%, mild aortic stenosis, paroxysmal atrial fibrillation Mali vas 2 score of at least 3 patient on Eliquis, chronic iron deficiency anemia, HTN, Dyslipidemia, CAD, dyslipidemia, obesity, sleep apnea not on CPAP, osteoarthritis, comes to the hospital with a few week history of gradually progressive shortness of breath, orthopnea and a dry cough.  Denies any chest pain, no palpitations, no fever chills, no exposure to sick contacts or recent travel.  Came to the ER where she was diagnosed with acute on chronic CHF along with worsening iron deficiency anemia and I was called to admit.  She was Hemoccult negative in the ER, EKG and chest x-ray were nonacute and blood work otherwise was unremarkable.    Review of systems:    A full 10 point Review of Systems was done, except as stated above, all other Review of Systems were negative.   With Past History of the following :    Past Medical History:  Diagnosis Date  . Anxiety   . Coronary artery disease    a. s/p MI 2009 - PCI/DES distal  RCA w/ 2.75 x 18 Xience DES;  b. 05/2010 Myoview: Apical thinning, EF 73%;  c. 06/2010 Cath: moderate nonobs dzs, EF 65%;  d.  Lexiscan Myoview (07/2013):  No scar or ischemia, EF 67%; Normal Study  . Environmental allergies   .  Essential iris atrophy    Right side  . Fatigue   . Fibromyalgia   . GERD (gastroesophageal reflux disease)   . Glaucoma   . Hyperlipidemia   . Hypertension   . Menopause   . Obesity (BMI 30-39.9)   . OSA (obstructive sleep apnea)    does not wear CPAP  . Osteoarthritis   . Oxygen dependent    3L      Past Surgical History:  Procedure Laterality  Date  . CARDIAC CATHETERIZATION  06/29/2010   Mild to moderate coronary artery irregularities.  Her  proximal left anterior descending artery is moderately narrowed.  She  also has an eccentric stenosis in the proximal LAD.  These do not appear  to obstruct flow at present, but they are fairly small vessels.  They  appeared to be between 1.5 and 2 mm in diamete  . CARDIAC CATHETERIZATION  08/02/2009   Patent stent  . CARDIAC CATHETERIZATION  05/12/2008   Stent to the distal RCA  . CARDIAC CATHETERIZATION N/A 09/20/2015   Procedure: Left Heart Cath and Coronary Angiography;  Surgeon: Jettie Booze, MD;  Location: [place];  Service: Cardiovascular;  Laterality: N/A;  . CARDIAC CATHETERIZATION  09/20/2015   Procedure: Intravascular Ultrasound/IVUS;  Surgeon: Jettie Booze, MD;  Location: [facility];  Service: Cardiovascular;;  . CARDIAC CATHETERIZATION  09/20/2015   Procedure: Intravascular Pressure Wire/FFR Study;  Surgeon: Jettie Booze, MD;  Location: [place] [facility];  Service: Cardiovascular;;  . CLIPPING OF ATRIAL APPENDAGE N/A 09/23/2015   Procedure:  CLIPPING OF ATRIAL APPENDAGE;  Surgeon: Grace Isaac, MD;  Location: [place];  Service: Open Heart Surgery;  Laterality: N/A;  . CORONARY ARTERY BYPASS GRAFT N/A 09/23/2015   Procedure: CORONARY ARTERY BYPASS GRAFTING (CABG) TIMES 1 USING LEFT INTERNAL MAMMARY TO THE LAD;  Surgeon: Grace Isaac, MD;  Location: [place];  Service: Open Heart Surgery;  Laterality: N/A;  . MAZE N/A 09/23/2015   Procedure: MAZE;  Surgeon: Grace Isaac, MD;  Location: [place];   Service: Open Heart Surgery;  Laterality: N/A;  . RESECTION OF ARTERIOVENOUS FISTULA ANEURYSM Right 10/21/2015   Procedure: SUTURE REPAIR OF RADIAL ARTERY AND EVACUATION OF HEMATOMA;  Surgeon: Mal Misty, MD;  Location: [place];  Service: Vascular;  Laterality: Right;  . TEE WITHOUT CARDIOVERSION N/A 09/23/2015   Procedure: TRANSESOPHAGEAL ECHOCARDIOGRAM (TEE);  Surgeon: Grace Isaac, MD;  Location: [place];  Service: Open Heart Surgery;  Laterality: N/A;      Social History:     Social History   Tobacco Use  . Smoking status: Former Smoker    Types: Cigarettes    Last attempt to quit: 09/13/1960    Years since quitting: 57.0  . Smokeless tobacco: Never Used  Substance Use Topics  . Alcohol use: No         Family History :     Family History  Problem Relation Age of Onset  . Heart attack Mother   . Cancer Father   . Coronary artery disease Brother        had CABG  . Coronary artery disease Brother   . Coronary artery disease Brother   . Coronary artery disease Brother   . Coronary artery disease Brother   . Heart attack Brother   . Breast cancer Sister        Home Medications:   Prior to Admission medications   Medication Sig Start Date End Date Taking? Authorizing Provider  aspirin EC 81 MG tablet Take 1 tablet (81 mg total) by mouth daily. 05/31/17  Yes Nahser, Wonda Cheng, MD  ELIQUIS 5 MG TABS tablet TAKE 1 TABLET(5 MG) BY MOUTH TWICE DAILY 07/22/17  Yes Nahser, Wonda Cheng, MD  ferrous sulfate 325 (65 FE) MG tablet Take 325 mg by mouth every morning.    Yes [provider]  furosemide (LASIX) 40 MG tablet Take 1 tablet (40 mg total)  by mouth daily. 03/07/16  Yes Nahser, Wonda Cheng, MD  levothyroxine (SYNTHROID, LEVOTHROID) 75 MCG tablet Take 1 tablet (75 mcg total) by mouth daily before breakfast. 06/01/17  Yes Tereasa Coop, PA-C  lisinopril (PRINIVIL,ZESTRIL) 20 MG tablet TAKE 1 TABLET(20 MG) BY MOUTH DAILY 06/03/17  Yes Nahser, Wonda Cheng, MD  metoprolol  tartrate (LOPRESSOR) 25 MG tablet Take 1 tablet (25 mg total) by mouth 2 (two) times daily. 02/11/17  Yes Nahser, Wonda Cheng, MD  pantoprazole (PROTONIX) 40 MG tablet Take 40 mg by mouth 2 (two) times daily.  01/07/14  Yes [provider]  pramipexole (MIRAPEX) 1 MG tablet Take 1-3 mg by mouth at bedtime.    Yes [provider]  traMADol (ULTRAM) 50 MG tablet Take one tablet by mouth every 4 hours as needed for moderate pain; Take two tablets by mouth every 4 hours as needed for severe pain 10/06/15  Yes Reed, Tiffany L, DO  venlafaxine (EFFEXOR) 37.5 MG tablet Take 37.5 mg by mouth 2 (two) times daily.   Yes [provider]  nitroGLYCERIN (NITROSTAT) 0.4 MG SL tablet Place 1 tablet (0.4 mg total) under the tongue every 5 (five) minutes as needed for chest pain. 02/23/16 11/05/16  Nahser, Wonda Cheng, MD  rosuvastatin (CRESTOR) 5 MG tablet Take 1 tablet (5 mg total) daily by mouth. 03/27/17 06/25/17  Nahser, Wonda Cheng, MD     Allergies:     Allergies  Allergen Reactions  . Other Nausea Only    NO MEAT!!  . Sulfamethoxazole-Trimethoprim Nausea Only  . Cortisone Nausea Only and Other (See Comments)    Pt gets very hot and flushed  . Latex Rash    Redness and irritation  . Tape Rash    Redness and irritation     Physical Exam:   Vitals  Blood pressure (!) 177/54, pulse 69, temperature 97.8 F (36.6 C), temperature source Oral, resp. rate 15, SpO2 100 %.   1. General middle-aged pleasant white female, obese sitting in hospital bed in no apparent distress,  2. Normal affect and insight, Not Suicidal or Homicidal, Awake Alert, Oriented X 3.  3. No F.N deficits, ALL C.Nerves Intact, Strength 5/5 all 4 extremities, Sensation intact all 4 extremities, Plantars down going.  4. Ears and Eyes appear Normal, Conjunctivae clear, PERRLA. Moist Oral Mucosa.  5. Supple Neck, No JVD, No cervical lymphadenopathy appriciated, No Carotid Bruits.  6. Symmetrical Chest wall movement,  Good air movement bilaterally, CTAB.  7. RRR, No Gallops, Rubs or Murmurs, No Parasternal Heave.  8. Positive Bowel Sounds, Abdomen Soft, No tenderness, No organomegaly appriciated,No rebound -guarding or rigidity.  9.  No Cyanosis, Normal Skin Turgor, No Skin Rash or Bruise.  10. Good muscle tone,  joints appear normal , no effusions, Normal ROM.  11. No Palpable Lymph Nodes in Neck or Axillae     Data Review:    CBC Recent Labs  Lab 09/28/17 1232  WBC 6.5  HGB 8.3*  HCT 26.9*  PLT 274  MCV 83.5  MCH 25.8*  MCHC 30.9  RDW 14.7   ------------------------------------------------------------------------------------------------------------------  Chemistries  Recent Labs  Lab 09/28/17 1232  NA 135  K 4.6  CL 103  CO2 24  GLUCOSE 121*  BUN 18  CREATININE 1.19*  CALCIUM 9.4   ------------------------------------------------------------------------------------------------------------------ CrCl cannot be calculated (Unknown ideal weight.). ------------------------------------------------------------------------------------------------------------------ No results for input(s): TSH, T4TOTAL, T3FREE, THYROIDAB in the last 72 hours.  Invalid input(s): FREET3  Coagulation profile No results for input(s):  INR, PROTIME in the last 168 hours. ------------------------------------------------------------------------------------------------------------------- No results for input(s): DDIMER in the last 72 hours. -------------------------------------------------------------------------------------------------------------------  Cardiac Enzymes No results for input(s): CKMB, TROPONINI, MYOGLOBIN in the last 168 hours.  Invalid input(s): CK ------------------------------------------------------------------------------------------------------------------    Component Value Date/Time   BNP 446.4 (H) 09/28/2017 1632   BNP 178.3 (H) 10/13/2015 1002      ---------------------------------------------------------------------------------------------------------------  Urinalysis    Component Value Date/Time   COLORURINE YELLOW 09/22/2015 1822   APPEARANCEUR CLEAR 09/22/2015 1822   LABSPEC 1.010 09/22/2015 1822   PHURINE 6.5 09/22/2015 1822   GLUCOSEU NEGATIVE 09/22/2015 1822   GLUCOSEU NEGATIVE 09/22/2010 1456   HGBUR NEGATIVE 09/22/2015 1822   BILIRUBINUR negative 08/27/2017 1712   BILIRUBINUR neg 08/13/2012 2027   KETONESUR negative 08/27/2017 1712   KETONESUR NEGATIVE 09/22/2015 1822   PROTEINUR =30 (A) 08/27/2017 1712   PROTEINUR NEGATIVE 09/22/2015 1822   UROBILINOGEN 0.2 08/27/2017 1712   UROBILINOGEN 0.2 01/10/2015 0915   NITRITE Positive (A) 08/27/2017 1712   NITRITE NEGATIVE 09/22/2015 1822   LEUKOCYTESUR Large (3+) (A) 08/27/2017 1712    ----------------------------------------------------------------------------------------------------------------   Imaging Results:    Dg Chest 2 View  Result Date: 09/28/2017 CLINICAL DATA:  Shortness of breath EXAM: CHEST - 2 VIEW COMPARISON:  February 16, 2017 FINDINGS: The mediastinal contour is normal. The heart size is enlarged. There is pulmonary edema. There is no focal pneumonia or pleural effusion. The visualized skeletal structures are unremarkable. IMPRESSION: Congestive heart failure. Electronically Signed   By: Abelardo Diesel M.D.   On: 09/28/2017 12:53    My personal review of EKG: Rhythm NSR, no acute ST changes   Assessment & Plan:     1.  Acute on chronic diastolic CHF EF 33% on echocardiogram around 1 year ago.  She will be admitted to a telemetry bed, placed on IV Lasix, continue beta-blocker, fluid and salt restriction, monitor weight, intake output and electrolytes.  Supplemental oxygen if needed.  2.  CAD, Paroxysmal atrial fibrillation.  Mali vas 2 score 3 -for now will continue beta-blocker and Eliquis, currently in sinus, she is chest pain-free, EKG is  nonacute, due to her worsening iron deficiency anemia will hold aspirin.  Continue statin for secondary prevention.  3.  GERD.  Continue PPI.  4.  Worsening iron deficiency anemia.  Hemoccult negative in the ER, no reported history of melena or bright red blood in stool, could be intermittent mild occult GI blood loss, hold aspirin, continue PPI twice daily, type screen and monitor H&H.  Outpatient GI work-up if stable.  5.  Morbid obesity with obstructive sleep apnea.  Outpatient follow-up with PCP, not on CPAP.  6.  Dyslipidemia.  Continue home dose statin.  7.  Hypertension.  Continue home dose beta-blocker, Lasix and hydralazine added for better control we will continue to monitor.    DVT Prophylaxis Eliquis  AM Labs Ordered, also please review Full Orders  Family Communication: Admission, patients condition and plan of care including tests being ordered have been discussed with the patient who indicates understanding and agree with the plan and Code Status.  Code Status Full  Likely DC to  Home 1-2 days  Condition GUARDED    Consults called: None  Admission status: Inpt    Time spent in minutes : 35   Lala Lund M.D on 09/28/2017 at 5:54 PM  Between 7am to 7pm - Pager - [phone] ( page via [facility], text pages only, please mention full 10 digit call back number).  After 7pm go to [email or web address] - password [facility]  [facility] - Office  [phone]

## 2017-09-28 NOTE — Progress Notes (Signed)
[place] for apixaban Indication: atrial fibrillation  Labs: Recent Labs    09/28/17 1232  HGB 8.3*  HCT 26.9*  PLT 274  CREATININE 1.19*    Assessment: 34 yof on apixaban PTA for afib presenting with SOB, wheezing. Pharmacy consulted to dose while inpatient. Last dose documented as "past week" on med rec. SCr 1.19, Hg low at 8.3, plt wnl on admit. No bleed documented. FOBT neg.  Goal of Therapy:  Stroke prevention Monitor platelets by anticoagulation protocol: Yes   Plan:  Resume apixaban 5mg  PO BID from PTA Monitor CBC, s/sx bleeding  Elicia Lamp, PharmD, BCPS Clinical Pharmacist 09/28/2017 5:35 PM

## 2017-09-29 LAB — CBC
HEMATOCRIT: 27.8 % — AB (ref 36.0–46.0)
Hemoglobin: 8.7 g/dL — ABNORMAL LOW (ref 12.0–15.0)
MCH: 26.3 pg (ref 26.0–34.0)
MCHC: 31.3 g/dL (ref 30.0–36.0)
MCV: 84 fL (ref 78.0–100.0)
Platelets: 248 10*3/uL (ref 150–400)
RBC: 3.31 MIL/uL — AB (ref 3.87–5.11)
RDW: 15.2 % (ref 11.5–15.5)
WBC: 6 10*3/uL (ref 4.0–10.5)

## 2017-09-29 LAB — BASIC METABOLIC PANEL
Anion gap: 11 (ref 5–15)
BUN: 18 mg/dL (ref 6–20)
CO2: 25 mmol/L (ref 22–32)
Calcium: 9.4 mg/dL (ref 8.9–10.3)
Chloride: 103 mmol/L (ref 101–111)
Creatinine, Ser: 1.18 mg/dL — ABNORMAL HIGH (ref 0.44–1.00)
GFR calc non Af Amer: 44 mL/min — ABNORMAL LOW (ref >60)
GFR, EST AFRICAN AMERICAN: 51 mL/min — AB (ref >60)
Glucose, Bld: 103 mg/dL — ABNORMAL HIGH (ref 65–99)
POTASSIUM: 4.2 mmol/L (ref 3.5–5.1)
SODIUM: 139 mmol/L (ref 135–145)

## 2017-09-29 MED ORDER — FERROUS SULFATE 325 (65 FE) MG PO TABS
325.0000 mg | ORAL_TABLET | Freq: Three times a day (TID) | ORAL | Status: DC
  Administered 2017-09-29 – 2017-09-30 (×3): 325 mg via ORAL
  Filled 2017-09-29 (×3): qty 1

## 2017-09-29 MED ORDER — DOCUSATE SODIUM 100 MG PO CAPS
200.0000 mg | ORAL_CAPSULE | Freq: Two times a day (BID) | ORAL | Status: DC
  Administered 2017-09-29 – 2017-09-30 (×2): 200 mg via ORAL
  Filled 2017-09-29 (×2): qty 2

## 2017-09-29 MED ORDER — PRAMIPEXOLE DIHYDROCHLORIDE 1 MG PO TABS
1.0000 mg | ORAL_TABLET | Freq: Once | ORAL | Status: AC
  Administered 2017-09-30: 1 mg via ORAL
  Filled 2017-09-29: qty 1

## 2017-09-29 MED ORDER — FUROSEMIDE 10 MG/ML IJ SOLN
20.0000 mg | Freq: Once | INTRAMUSCULAR | Status: AC
  Administered 2017-09-29: 20 mg via INTRAVENOUS
  Filled 2017-09-29: qty 2

## 2017-09-29 MED ORDER — HYDRALAZINE HCL 50 MG PO TABS
50.0000 mg | ORAL_TABLET | Freq: Three times a day (TID) | ORAL | Status: DC
  Administered 2017-09-29 – 2017-09-30 (×3): 50 mg via ORAL
  Filled 2017-09-29 (×3): qty 1

## 2017-09-29 NOTE — Progress Notes (Signed)
@IPLOG @        PROGRESS NOTE                                                                                                                                                                                                             Patient Demographics:    [name], is a 77 y.o. female, DOB - 1940-12-21, KKX:[id number]  Admit date - 09/28/2017   Admitting Physician Thurnell Lose, MD  Outpatient Primary MD for the patient is Alroy Dust, L.Marlou Sa, MD  LOS - 1  Chief Complaint  Patient presents with  . Shortness of Breath       Brief Narrative  - [name]  is a 77 y.o. female, with history of chronic diastolic CHF EF 16%, mild aortic stenosis, paroxysmal atrial fibrillation Mali vas 2 score of at least 3 patient on Eliquis, chronic iron deficiency anemia, HTN, Dyslipidemia, CAD, dyslipidemia, obesity, sleep apnea not on CPAP, osteoarthritis, comes to the hospital with a few week history of gradually progressive shortness of breath, orthopnea and a dry cough.  Denies any chest pain, no palpitations, no fever chills, no exposure to sick contacts or recent travel.  Came to the ER where she was diagnosed with acute on chronic CHF along with worsening iron deficiency anemia and I was called to admit.  She was Hemoccult negative in the ER, EKG and chest x-ray were nonacute and blood work otherwise was unremarkable.    Subjective:    [name] today has, No headache, No chest pain, No abdominal pain - No Nausea, No new weakness tingling or numbness, No Cough - much improved SOB.     Assessment  & Plan :     1.  Acute on chronic diastolic CHF EF 96% on echocardiogram around 1 year ago.    She has remained stable on telemetry bed, responded well to IV Lasix, so far 2.5 L negative, shortness of breath is almost completely resolved, continue salt and fluid restriction, IV Lasix, continue home dose beta-blocker.  If continues to improve likely discharge tomorrow.  Have encouraged her to  advance her activity.  2.  CAD, Paroxysmal atrial fibrillation.  Mali vas 2 score 3 -for now will continue beta-blocker and Eliquis, currently in sinus, she is chest pain-free, EKG is nonacute, due to her worsening iron deficiency anemia will hold aspirin.  Continue statin for secondary prevention.  3.  GERD.  Continue PPI.  4.  Worsening iron deficiency anemia.  Hemoccult negative in the ER, no reported history of melena  or bright red blood in stool, could be intermittent mild occult GI blood loss, hold aspirin, continue PPI twice daily, type screen has been done, hemoglobin is actually improved with diuresis, anemia profile suggestive of iron deficiency and she will be placed on oral iron, will have her follow with PCP for age-appropriate outpatient iron deficiency anemia work-up which may include outpatient GI follow-up as well.  Will continue to hold aspirin upon discharge.  5.  Morbid obesity with obstructive sleep apnea.  Outpatient follow-up with PCP, not on CPAP.  6.  Dyslipidemia.  Continue home dose statin.  7.  Hypertension.  Continue home dose beta-blocker, Lasix and will add 50 mg of hydralazine 3 times a day for better control along with as needed IV hydralazine.     Diet Order           Diet 2 gram sodium Room service appropriate? Yes; Fluid consistency: Thin; Fluid restriction: 1500 mL Fluid  Diet effective now           Family Communication  :  None  Code Status :  Full  Disposition Plan  :  Home in am  Consults  :  None  Procedures  :    TTE Dec 2017 -  Left ventricle: The cavity size was normal. There was mild concentric hypertrophy. Systolic function was vigorous. The estimated ejection fraction was in the range of 65% to 70%. Wall motion was normal; there were no regional wall motion abnormalities. Features are consistent with a pseudonormal left ventricular filling pattern, with concomitant abnormal relaxation and increased filling pressure (grade 2  diastolic dysfunction).  Aortic valve: Moderate diffuse thickening and calcification. There was very mild stenosis. There was moderate regurgitation.  Valve area (VTI): 1.09 cm^2. Valve area (Vmax): 1.01 cm^2. Valve area (Vmean): 1.12 cm^2. Regurgitation pressure half-time: 355 ms.   DVT Prophylaxis  :  Eliquis  Lab Results  Component Value Date   PLT 248 09/29/2017    Inpatient Medications  Scheduled Meds: . apixaban  5 mg Oral BID  . ferrous sulfate  325 mg Oral q morning - 10a  . furosemide  20 mg Intravenous Once  . hydrALAZINE  25 mg Oral Q8H  . levothyroxine  75 mcg Oral QAC breakfast  . lisinopril  5 mg Oral Daily  . metoprolol tartrate  25 mg Oral BID  . pantoprazole  40 mg Oral BID  . pramipexole  1 mg Oral QHS  . rosuvastatin  5 mg Oral Daily  . venlafaxine  37.5 mg Oral BID   Continuous Infusions: PRN Meds:.bisacodyl, nitroGLYCERIN  Antibiotics  :    Anti-infectives (From admission, onward)   None         Objective:   Vitals:   09/29/17 0350 09/29/17 0400 09/29/17 0645 09/29/17 0746  BP: (!) 179/66 (!) 176/80 (!) 153/60 (!) 169/51  Pulse: 80 79  76  Resp: 11 19  13   Temp: 97.9 F (36.6 C)   97.7 F (36.5 C)  TempSrc: Oral   Oral  SpO2: 98% 97%  98%  Weight:      Height:        Wt Readings from Last 3 Encounters:  09/28/17 114.3 kg (251 lb 14.4 oz)  08/27/17 113.4 kg (250 lb)  08/22/17 115.3 kg (254 lb 1.9 oz)     Intake/Output Summary (Last 24 hours) at 09/29/2017 0912 Last data filed at 09/29/2017 0350 Gross per 24 hour  Intake -  Output 2150 ml  Net -2150  ml     Physical Exam  Awake Alert, Oriented X 3, No new F.N deficits, Normal affect [name],PERRAL Supple Neck,No JVD, No cervical lymphadenopathy appriciated.  Symmetrical Chest wall movement, Good air movement bilaterally, few rales RRR,No Gallops,Rubs or new Murmurs, No Parasternal Heave +ve B.Sounds, Abd Soft, No tenderness, No organomegaly appriciated, No rebound - guarding or  rigidity. No Cyanosis, Clubbing, Trace edema, No new Rash or bruise       Data Review:    CBC Recent Labs  Lab 09/28/17 1232 09/29/17 0219  WBC 6.5 6.0  HGB 8.3* 8.7*  HCT 26.9* 27.8*  PLT 274 248  MCV 83.5 84.0  MCH 25.8* 26.3  MCHC 30.9 31.3  RDW 14.7 15.2    Chemistries  Recent Labs  Lab 09/28/17 1232 09/29/17 0219  NA 135 139  K 4.6 4.2  CL 103 103  CO2 24 25  GLUCOSE 121* 103*  BUN 18 18  CREATININE 1.19* 1.18*  CALCIUM 9.4 9.4   ------------------------------------------------------------------------------------------------------------------ No results for input(s): CHOL, HDL, LDLCALC, TRIG, CHOLHDL, LDLDIRECT in the last 72 hours.  Lab Results  Component Value Date   HGBA1C 5.8 (H) 09/23/2015   ------------------------------------------------------------------------------------------------------------------ No results for input(s): TSH, T4TOTAL, T3FREE, THYROIDAB in the last 72 hours.  Invalid input(s): FREET3 ------------------------------------------------------------------------------------------------------------------ Recent Labs    09/28/17 1808  VITAMINB12 559  FOLATE 44.0  FERRITIN 13  TIBC 468*  IRON 19*  RETICCTPCT 1.5    Coagulation profile No results for input(s): INR, PROTIME in the last 168 hours.  No results for input(s): DDIMER in the last 72 hours.  Cardiac Enzymes No results for input(s): CKMB, TROPONINI, MYOGLOBIN in the last 168 hours.  Invalid input(s): CK ------------------------------------------------------------------------------------------------------------------    Component Value Date/Time   BNP 446.4 (H) 09/28/2017 1632   BNP 178.3 (H) 10/13/2015 1002    Micro Results Recent Results (from the past 240 hour(s))  MRSA PCR Screening     Status: None   Collection Time: 09/28/17  8:18 PM  Result Value Ref Range Status   MRSA by PCR NEGATIVE NEGATIVE Final    Comment:        The GeneXpert MRSA Assay  (FDA approved for NASAL specimens only), is one component of a comprehensive MRSA colonization surveillance program. It is not intended to diagnose MRSA infection nor to guide or monitor treatment for MRSA infections. Performed at [facility], [place] [address] [place], [place] [address]     Radiology Reports Dg Chest 2 View  Result Date: 09/28/2017 CLINICAL DATA:  Shortness of breath EXAM: CHEST - 2 VIEW COMPARISON:  February 16, 2017 FINDINGS: The mediastinal contour is normal. The heart size is enlarged. There is pulmonary edema. There is no focal pneumonia or pleural effusion. The visualized skeletal structures are unremarkable. IMPRESSION: Congestive heart failure. Electronically Signed   By: Abelardo Diesel M.D.   On: 09/28/2017 12:53    Time Spent in minutes  30   Lala Lund M.D on 09/29/2017 at 9:12 AM  Between 7am to 7pm - Pager - [phone] ( page via [email or web address], text pages only, please mention full 10 digit call back number). After 7pm go to [email or web address] - password [email or web address]

## 2017-09-30 LAB — HEMOGLOBIN AND HEMATOCRIT, BLOOD
HCT: 29.3 % — ABNORMAL LOW (ref 36.0–46.0)
HEMOGLOBIN: 9 g/dL — AB (ref 12.0–15.0)

## 2017-09-30 LAB — BASIC METABOLIC PANEL
Anion gap: 9 (ref 5–15)
BUN: 20 mg/dL (ref 6–20)
CO2: 29 mmol/L (ref 22–32)
CREATININE: 1.31 mg/dL — AB (ref 0.44–1.00)
Calcium: 9.7 mg/dL (ref 8.9–10.3)
Chloride: 99 mmol/L — ABNORMAL LOW (ref 101–111)
GFR calc Af Amer: 45 mL/min — ABNORMAL LOW (ref >60)
GFR, EST NON AFRICAN AMERICAN: 39 mL/min — AB (ref >60)
GLUCOSE: 126 mg/dL — AB (ref 65–99)
POTASSIUM: 3.9 mmol/L (ref 3.5–5.1)
SODIUM: 137 mmol/L (ref 135–145)

## 2017-09-30 LAB — MAGNESIUM: Magnesium: 2.3 mg/dL (ref 1.7–2.4)

## 2017-09-30 LAB — PROTIME-INR
INR: 1.15
Prothrombin Time: 14.6 seconds (ref 11.4–15.2)

## 2017-09-30 MED ORDER — AMLODIPINE BESYLATE 10 MG PO TABS
10.0000 mg | ORAL_TABLET | Freq: Every day | ORAL | Status: DC
  Administered 2017-09-30: 10 mg via ORAL
  Filled 2017-09-30: qty 1

## 2017-09-30 MED ORDER — POTASSIUM CHLORIDE ER 20 MEQ PO TBCR: 20.0000 meq | EXTENDED_RELEASE_TABLET | Freq: Every day | ORAL | 0 refills | Status: DC

## 2017-09-30 MED ORDER — FUROSEMIDE 40 MG PO TABS: 40.0000 mg | ORAL_TABLET | Freq: Two times a day (BID) | ORAL | 0 refills | Status: AC

## 2017-09-30 MED ORDER — AMLODIPINE BESYLATE 10 MG PO TABS: 10.0000 mg | ORAL_TABLET | Freq: Every day | ORAL | 0 refills | Status: DC

## 2017-09-30 MED ORDER — FERROUS SULFATE 325 (65 FE) MG PO TABS: 325.0000 mg | ORAL_TABLET | Freq: Two times a day (BID) | ORAL | 0 refills | Status: AC

## 2017-09-30 NOTE — Clinical Social Work Note (Signed)
Clinical Social Work Assessment  Patient Details  Name: [name] MRN: [id number] Date of Birth: 04-Sep-1940  Date of referral:  09/30/17               Reason for consult:  Housing Concerns/Homelessness                Permission sought to share information with:  Family Supports Permission granted to share information::     Name::     [name]  Agency::     Relationship::  daughter  Contact Information:  [phone]  Housing/Transportation Living arrangements for the past 2 months:  Single Family Home Source of Information:  Patient Patient Interpreter Needed:  None Criminal Activity/Legal Involvement Pertinent to Current Situation/Hospitalization:  No - Comment as needed Significant Relationships:  Adult Children, Other Family Members, Friend Lives with:  Adult Children(has been staying with daughter) Do you feel safe going back to the place where you live?  Yes Need for family participation in patient care:  Yes (Comment)  Care giving concerns: No family at bedside. Patient stated she had her own apartment last year but gave it up to live with her adult son in [place] but stated that did not work out because son's roommate "was weird". Patient stated she move back to the [place] area and lives with her daughter but stated her daughters house is small and the rooms are tiny. Patient stated she would like her own space.  Social Worker assessment / plan:  CSW met patient at bedside to offer support and discuss some resources. CSW spoke to patient about low income housing/ section 8 housing. CSW encouraged patient to reach out to DSS and get her self signed up for those programs. Patient stated she has some apartment complex lined up that she will look into. Patient stated that some of the apartments in the area are expensive but stated she's hoping her son will help with the cost. Patient thanked CSW for the program recommendation and stated she will look into low income  apartments. Patients denied homeless shelters  Employment status:  Retired Forensic scientist:  Medicare PT Recommendations:  Not assessed at this time Information / Referral to community resources:  Shelter  Patient/Family's Response to care:  Patient thankful for the support she has received during her stay  Patient/Family's Understanding of and Emotional Response to Diagnosis, Current Treatment, and Prognosis:  Patient stated she will go home with a friend and will look into apartment complex.   Emotional Assessment Appearance:  Appears older than stated age Attitude/Demeanor/Rapport:  Engaged Affect (typically observed):  Accepting Orientation:  Oriented to Self, Oriented to Place, Oriented to  Time, Oriented to Situation Alcohol / Substance use:  Not Applicable Psych involvement (Current and /or in the community):  No (Comment)  Discharge Needs  Concerns to be addressed:  Homelessness Readmission within the last 30 days:  No Current discharge risk:  Homeless Barriers to Discharge:  Homeless with medical needs   Wende Neighbors, LCSW 09/30/2017, 10:40 AM

## 2017-09-30 NOTE — Progress Notes (Signed)
Stopped in an explained Advanced Directive paperwork with this patient who requested it.  She stated that she didn't get any sleep last night and would like to sleep.  After explaining paperwork to patient, I shared with her to please let us help her as she needs.  She will contact her nurse when she is ready for notary.    09/30/17 1055  Clinical Encounter Type  Visited With Patient  Visit Type Initial;Spiritual support

## 2017-09-30 NOTE — Progress Notes (Addendum)
Patient called and stated she was having a nose bleed. Assessing patient advised her to hold pressure on the bridge of her nose and before she could exchange her tissues she was dripping blood onto her gown from her nose bleed. Patient stated she has this several times a week and sometimes it lasts for hours. Instructed patient not to blow her nose and the packing was saturated before we could get it installed into her nose. Patient is on Eliquis daily and   she stated she thinks this problem is part of her anemia she suffers with. Blood pressure at the time of this nose bleed was 198/77. Her 50mg  PO dose of Hydralazine was given at 0600.

## 2017-09-30 NOTE — Discharge Summary (Signed)
[name]:[id number] DOB: 01-09-1941 DOA: 09/28/2017  PCP: Alroy Dust, L.Marlou Sa, MD  Admit date: 09/28/2017  Discharge date: 09/30/2017  Admitted From: Home   Disposition:  Home   Recommendations for Outpatient Follow-up:   Follow up with PCP in 1-2 weeks  PCP Please obtain BMP/CBC, 2 view CXR in 1week,  (see Discharge instructions)   PCP Please follow up on the following pending results: Iron panel   Home Health: RN,PT, S Work   Equipment/Devices: None  Consultations: None Discharge Condition: Stable   CODE STATUS: Full   Diet Recommendation:  Heart Healthy - 1.5lit/day fluid restriction.   Chief Complaint  Patient presents with  . Shortness of Breath     Brief history of present illness from the day of admission and additional interim summary     [name],with history of chronic diastolic CHF EF 24%, mild aortic stenosis, paroxysmal atrial fibrillation Mali vas 2 score of at least 3 patient on Eliquis, chronic iron deficiency anemia,HTN, Dyslipidemia, CAD,dyslipidemia, obesity, sleep apnea not on CPAP, osteoarthritis, comes to the hospital with a few week history of gradually progressive shortness of breath, orthopnea and a dry cough. Denies any chest pain, no palpitations, no fever chills, no exposure to sick contacts or recent travel. Came to the ER where she was diagnosed with acute on chronic CHF along with worsening iron deficiency anemia and I was called to admit. She was Hemoccult negative in the [facility] and chest x-ray were nonacute and blood work otherwise was unremarkable.                                                                  Hospital Course    1.Acute on chronic diastolic CHF EF 26% on echocardiogram around 1 year ago.   She has remained stable on  telemetry bed, responded well to IV Lasix, so far 3.5 L negative, shortness of breath is almost completely resolved, continue salt and fluid restriction, placed on Lasix at home, continue home dose beta-blocker.  Now symptom-free will be discharged with outpatient follow-up with PCP and her primary cardiologist, request PCP to monitor her weight, BMP and diuretic dose closely.  2.CAD, Paroxysmal atrial fibrillation. Mali vas 2 score 3 -for now will continue beta-blocker and Eliquis, currently in sinus, she is chest pain-free, EKG is nonacute, due to her worsening iron deficiency anemia will hold aspirin. Continue statin for secondary prevention.  3.GERD. Continue PPI.  4.Worsening iron deficiency anemia. Hemoccult negative in the ER, no reported history of melena or bright red blood in stool, could be intermittent mild occult GI blood loss, hold aspirin, continue PPI twice daily, type screen has been done, hemoglobin is actually improved with diuresis, anemia profile suggestive of iron deficiency and she has been placed on iron sulfate 325 twice a  day,  will have her follow with PCP for age-appropriate outpatient iron deficiency anemia work-up which may include outpatient GI follow-up as well along with ENT for intermittent nosebleeds.  Will continue to hold aspirin upon discharge.  5.Morbid obesity with obstructive sleep apnea. Outpatient follow-up with PCP, not on CPAP.  6.Dyslipidemia. Continue home dose statin.  7.Hypertension. Continue home dose beta-blocker along with Lasix 40 BID and Norvasc 10mg , follow with PCP. ACE stopped (cret mildly high post diuresis)  8. Epistaxis - per patient ongoing intermittently for several months, could account for her anemia, small episode here, resolved, DC ASA, ENT follow up.    Discharge diagnosis     Principal Problem:   SOB (shortness of breath) Active Problems:   Coronary artery disease   Hypertension   Hyperlipidemia    OSA (obstructive sleep apnea)   Obesity (BMI 30-39.9)   Atrial fibrillation (HCC)   CAD (coronary artery disease)   CHF (congestive heart failure) [facility]    Discharge instructions    Discharge Instructions    Discharge instructions   Complete by:  As directed    Follow with Primary MD Alroy Dust, L.Marlou Sa, MD in 7 days   Get CBC, CMP, Magnesium, Iron Panel , 2 view Chest X ray checked  by Primary MD  in 5-7 days    Activity: As tolerated with Full fall precautions use walker/cane & assistance as needed  Disposition Home    Diet: Heart Healthy - Check your Weight same time everyday, if you gain over 2 pounds, or you develop in leg swelling, experience more shortness of breath or chest pain, call your Primary MD immediately. Follow Cardiac Low Salt Diet and 1.5 lit/day fluid restriction.  Special Instructions: If you have smoked or chewed Tobacco  in the last 2 yrs please stop smoking, stop any regular Alcohol  and or any Recreational drug use.  On your next visit with your primary care physician please Get Medicines reviewed and adjusted.  Please request your Prim.MD to go over all Hospital Tests and Procedure/Radiological results at the follow up, please get all Hospital records sent to your Prim MD by signing hospital release before you go home.  If you experience worsening of your admission symptoms, develop shortness of breath, life threatening emergency, suicidal or homicidal thoughts you must seek medical attention immediately by calling 911 or calling your MD immediately  if symptoms less severe.  You Must read complete instructions/literature along with all the possible adverse reactions/side effects for all the Medicines you take and that have been prescribed to you. Take any new Medicines after you have completely understood and accpet all the possible adverse reactions/side effects.   Increase activity slowly   Complete by:  As directed       Discharge Medications    Allergies as of 09/30/2017      Reactions   Other Nausea Only   NO MEAT!!   Sulfamethoxazole-trimethoprim Nausea Only   Cortisone Nausea Only, Other (See Comments)   Pt gets very hot and flushed   Latex Rash   Redness and irritation   Tape Rash   Redness and irritation      Medication List    STOP taking these medications   aspirin EC 81 MG tablet   lisinopril 20 MG tablet Commonly known as:  PRINIVIL,ZESTRIL     TAKE these medications   amLODipine 10 MG tablet Commonly known as:  NORVASC Take 1 tablet (10 mg total) by mouth daily.   ELIQUIS 5  MG Tabs tablet Generic drug:  apixaban TAKE 1 TABLET(5 MG) BY MOUTH TWICE DAILY   ferrous sulfate 325 (65 FE) MG tablet Take 1 tablet (325 mg total) by mouth 2 (two) times daily with a meal. What changed:  when to take this   furosemide 40 MG tablet Commonly known as:  LASIX Take 1 tablet (40 mg total) by mouth 2 (two) times daily. What changed:  when to take this   levothyroxine 75 MCG tablet Commonly known as:  SYNTHROID, LEVOTHROID Take 1 tablet (75 mcg total) by mouth daily before breakfast.   metoprolol tartrate 25 MG tablet Commonly known as:  LOPRESSOR Take 1 tablet (25 mg total) by mouth 2 (two) times daily.   nitroGLYCERIN 0.4 MG SL tablet Commonly known as:  NITROSTAT Place 1 tablet (0.4 mg total) under the tongue every 5 (five) minutes as needed for chest pain.   pantoprazole 40 MG tablet Commonly known as:  PROTONIX Take 40 mg by mouth 2 (two) times daily.   Potassium Chloride ER 20 MEQ Tbcr Take 20 mEq by mouth daily.   pramipexole 1 MG tablet Commonly known as:  MIRAPEX Take 1-3 mg by mouth at bedtime.   rosuvastatin 5 MG tablet Commonly known as:  CRESTOR Take 1 tablet (5 mg total) daily by mouth.   traMADol 50 MG tablet Commonly known as:  ULTRAM Take one tablet by mouth every 4 hours as needed for moderate pain; Take two tablets by mouth every 4 hours as needed for severe pain    venlafaxine 37.5 MG tablet Commonly known as:  EFFEXOR Take 37.5 mg by mouth 2 (two) times daily.       Follow-up Information    Alroy Dust, L.Marlou Sa, MD. Schedule an appointment as soon as possible for a visit in 3 day(s).   Specialty:  Family Medicine Contact information: [address] [place] [address] [phone]        Nahser, Wonda Cheng, MD. Schedule an appointment as soon as possible for a visit in 1 week(s).   Specialty:  Cardiology Contact information: [place] [address] [phone]        Melida Quitter, MD. Schedule an appointment as soon as possible for a visit in 3 day(s).   Specialty:  Otolaryngology Why:  recurrent nose bleeds Contact information: [address] [place] [address] [place] [address] [phone]           Major procedures and Radiology Reports - PLEASE review detailed and final reports thoroughly  -        Dg Chest 2 View  Result Date: 09/28/2017 CLINICAL DATA:  Shortness of breath EXAM: CHEST - 2 VIEW COMPARISON:  February 16, 2017 FINDINGS: The mediastinal contour is normal. The heart size is enlarged. There is pulmonary edema. There is no focal pneumonia or pleural effusion. The visualized skeletal structures are unremarkable. IMPRESSION: Congestive heart failure. Electronically Signed   By: Abelardo Diesel M.D.   On: 09/28/2017 12:53    Micro Results     Recent Results (from the past 240 hour(s))  MRSA PCR Screening     Status: None   Collection Time: 09/28/17  8:18 PM  Result Value Ref Range Status   MRSA by PCR NEGATIVE NEGATIVE Final    Comment:        The GeneXpert MRSA Assay (FDA approved for NASAL specimens only), is one component of a comprehensive MRSA colonization surveillance program. It is not intended  to diagnose MRSA infection nor to guide or monitor treatment for MRSA infections. Performed at [facility], [place] [address] [place], [place]  [address]     Today   Subjective    [name] today has no headache,no chest abdominal pain,no new weakness tingling or numbness, feels much better wants to go home today.     Objective   Blood pressure (!) 164/59, pulse 73, temperature 98.6 F (37 C), temperature source Oral, resp. rate 15, height 5' 5.5" (1.664 m), weight 114.3 kg (251 lb 14.4 oz), SpO2 97 %.   Intake/Output Summary (Last 24 hours) at 09/30/2017 0758 Last data filed at 09/30/2017 0400 Gross per 24 hour  Intake 1640 ml  Output -  Net 1640 ml    Exam Awake Alert, Oriented x 3, No new F.N deficits, Normal affect [place].AT,PERRAL Supple Neck,No JVD, No cervical lymphadenopathy appriciated.  Symmetrical Chest wall movement, Good air movement bilaterally, CTAB RRR,No Gallops,Rubs or new Murmurs, No Parasternal Heave +ve B.Sounds, Abd Soft, Non tender, No organomegaly appriciated, No rebound -guarding or rigidity. No Cyanosis, Clubbing or edema, No new Rash or bruise   Data Review   CBC w Diff:  Lab Results  Component Value Date   WBC 6.0 09/29/2017   HGB 8.7 (L) 09/29/2017   HGB 11.4 08/09/2016   HCT 27.8 (L) 09/29/2017   HCT 35.6 08/09/2016   PLT 248 09/29/2017   PLT 233 08/09/2016   LYMPHOPCT 33 04/25/2016   MONOPCT 7 04/25/2016   EOSPCT 2 04/25/2016   BASOPCT 1 04/25/2016    CMP:  Lab Results  Component Value Date   NA 137 09/30/2017   NA 135 08/22/2017   K 3.9 09/30/2017   CL 99 (L) 09/30/2017   CO2 29 09/30/2017   BUN 20 09/30/2017   BUN 23 08/22/2017   CREATININE 1.31 (H) 09/30/2017   CREATININE 1.12 (H) 10/13/2015   GLU 58 10/05/2015   PROT 7.9 05/31/2017   ALBUMIN 4.1 05/31/2017   BILITOT 0.3 05/31/2017   ALKPHOS 113 05/31/2017   AST 22 05/31/2017   ALT 12 05/31/2017  .   Total Time in preparing paper work, data evaluation and todays exam - 11 minutes  Lala Lund M.D on 09/30/2017 at 7:58 AM  [facility]   Office  [phone]

## 2017-09-30 NOTE — Telephone Encounter (Signed)
Eliquis 5mg  refill request received; pt is 77 yrs old, wt-113.4kg, Crea-1.31 on 09/30/17, last sen by Dr. Acie Fredrickson on 08/22/17; will send in refill to requested pharmacy.

## 2017-09-30 NOTE — Care Management Note (Signed)
Case Management Note  Patient Details  Name: [name] MRN: [id number] Date of Birth: [birth year]  Subjective/Objective:    Pt admitted with SOB                Action/Plan:   PTA independent from home - pt recently lost her home due to finances.  Pt will discharge home with a friend - CSW consulted for housing resources and HHSW also ordered.  Pt states she feels safe at her friends and can stay there until she finds something else. Pt will discharge to [place] [address] and the best number to reach pt is [phone].  Pt chose [facility] for [facility] - agency contacted - referral accepted and correct address/contact number provided to agency. Per bedside nurse pt is independently ambulating in room with cane.  Pt has cane in the home - pt denied needing additional equipment.  Pt educated on daily weights and low sodium intake.  Pt has PCP and denied barriers with obtaining/paying for medications  Expected Discharge Date:  09/30/17               Expected Discharge Plan:  [place]  In-House Referral:     Discharge planning Services  CM Consult  Post Acute Care Choice:    Choice offered to:  Patient  DME Arranged:    DME Agency:     HH Arranged:  RN, PT, Social Work [employer] Agency:  [facility]  Status of Service:  Completed, signed off  If discussed at [employer], dates discussed:    Additional Comments:  Maryclare Labrador, RN 09/30/2017, 8:34 AM

## 2017-09-30 NOTE — Discharge Instructions (Signed)
Follow with Primary MD Alroy Dust, L.Marlou Sa, MD in 7 days   Get CBC, CMP, Magnesium, Iron Panel , 2 view Chest X ray checked  by Primary MD  in 5-7 days    Activity: As tolerated with Full fall precautions use walker/cane & assistance as needed  Disposition Home    Diet: Heart Healthy - Check your Weight same time everyday, if you gain over 2 pounds, or you develop in leg swelling, experience more shortness of breath or chest pain, call your Primary MD immediately. Follow Cardiac Low Salt Diet and 1.5 lit/day fluid restriction.  Special Instructions: If you have smoked or chewed Tobacco  in the last 2 yrs please stop smoking, stop any regular Alcohol  and or any Recreational drug use.  On your next visit with your primary care physician please Get Medicines reviewed and adjusted.  Please request your Prim.MD to go over all Hospital Tests and Procedure/Radiological results at the follow up, please get all Hospital records sent to your Prim MD by signing hospital release before you go home.  If you experience worsening of your admission symptoms, develop shortness of breath, life threatening emergency, suicidal or homicidal thoughts you must seek medical attention immediately by calling 911 or calling your MD immediately  if symptoms less severe.  You Must read complete instructions/literature along with all the possible adverse reactions/side effects for all the Medicines you take and that have been prescribed to you. Take any new Medicines after you have completely understood and accpet all the possible adverse reactions/side effects.

## 2017-10-03 ENCOUNTER — Telehealth: Payer: Self-pay | Admitting: Cardiovascular Disease

## 2017-10-03 NOTE — Telephone Encounter (Signed)
New message      Pt c/o medication issue:  1. Name of Medication: amLODipine (NORVASC) 10 MG tablet and metoprolol tartrate (LOPRESSOR) 25 MG tablet  2. How are you currently taking this medication (dosage and times per day)? As written  3. Are you having a reaction (difficulty breathing--STAT)? no  4. What is your medication issue? Patient calling with concerns about getting medication.  Patient prescription coverage does not begin until 10/19/2017. Are samples available for these medications?

## 2017-10-03 NOTE — Telephone Encounter (Signed)
Looks like patients dose was changed to 10 mg qd by Dr Candiss Norse. Okay to refill? Please advise. Thanks, MI

## 2017-10-03 NOTE — Telephone Encounter (Signed)
Spoke with patient who states she was told by her pharmacy that her medications are > $400. She states she checked herself into the hospital on 5/11 for fatigue and SOB and some of her medications were changed. I reviewed her medication list with her and advised her that the metoprolol and furosemide are on the $4 list at [facility]. I advised that eliquis will be expensive but she states she has enough of that to get through until her prescription plan kicks in June 1.  I advised her to call her pharmacy and get prices for each medication and to call back if she needs new Rx sent to a different pharmacy or other assistance. She verbalized understanding and agreement with plan and thanked me for the call.

## 2017-10-03 NOTE — Telephone Encounter (Signed)
New message    . *STAT* If patient is at the pharmacy, call can be transferred to refill team.   1. Which medications need to be refilled? (please list name of each medication and dose if known) amLODipine (NORVASC) 10 MG tablet  2. Which pharmacy/location (including street and city if local pharmacy) is medication to be sent to? [facility] [place] [address]  3. Do they need a 30 day or 90 day supply? [place]

## 2017-10-04 ENCOUNTER — Ambulatory Visit: Payer: Medicare Other | Admitting: Cardiology

## 2017-10-07 MED ORDER — AMLODIPINE BESYLATE 10 MG PO TABS: 10.0000 mg | ORAL_TABLET | Freq: Every day | ORAL | 1 refills | Status: DC

## 2017-10-07 NOTE — Telephone Encounter (Signed)
Refill completed.

## 2017-10-08 ENCOUNTER — Other Ambulatory Visit: Payer: Self-pay | Admitting: Family Medicine

## 2017-10-08 ENCOUNTER — Ambulatory Visit
Admission: RE | Admit: 2017-10-08 | Discharge: 2017-10-08 | Disposition: A | Discharge status: Home / Self Care | Payer: Medicare Other | Source: Ambulatory Visit | Attending: Family Medicine | Admitting: Family Medicine

## 2017-10-08 DIAGNOSIS — I5033 Acute on chronic diastolic (congestive) heart failure: Secondary | ICD-10-CM

## 2017-10-08 DIAGNOSIS — M797 Fibromyalgia: Secondary | ICD-10-CM | POA: Diagnosis not present

## 2017-10-08 DIAGNOSIS — R0602 Shortness of breath: Secondary | ICD-10-CM | POA: Diagnosis not present

## 2017-10-08 DIAGNOSIS — D509 Iron deficiency anemia, unspecified: Secondary | ICD-10-CM | POA: Diagnosis not present

## 2017-10-08 DIAGNOSIS — K219 Gastro-esophageal reflux disease without esophagitis: Secondary | ICD-10-CM | POA: Diagnosis not present

## 2017-10-08 DIAGNOSIS — I10 Essential (primary) hypertension: Secondary | ICD-10-CM | POA: Diagnosis not present

## 2017-10-17 ENCOUNTER — Other Ambulatory Visit (HOSPITAL_COMMUNITY): Payer: Self-pay | Admitting: *Deleted

## 2017-10-18 ENCOUNTER — Encounter (HOSPITAL_COMMUNITY): Payer: Self-pay | Admitting: Emergency Medicine

## 2017-10-18 ENCOUNTER — Ambulatory Visit (HOSPITAL_COMMUNITY)
Admission: RE | Admit: 2017-10-18 | Discharge: 2017-10-18 | Disposition: A | Discharge status: Home / Self Care | Payer: Medicare Other | Source: Ambulatory Visit | Attending: Family Medicine | Admitting: Family Medicine

## 2017-10-18 ENCOUNTER — Other Ambulatory Visit: Payer: Self-pay

## 2017-10-18 ENCOUNTER — Emergency Department (HOSPITAL_COMMUNITY)
Admission: EM | Admit: 2017-10-18 | Discharge: 2017-10-18 | Disposition: A | Discharge status: Home / Self Care | Payer: Medicare Other | Attending: Emergency Medicine | Admitting: Emergency Medicine

## 2017-10-18 ENCOUNTER — Emergency Department (HOSPITAL_COMMUNITY): Payer: Medicare Other

## 2017-10-18 DIAGNOSIS — M549 Dorsalgia, unspecified: Secondary | ICD-10-CM | POA: Diagnosis present

## 2017-10-18 DIAGNOSIS — D509 Iron deficiency anemia, unspecified: Secondary | ICD-10-CM | POA: Diagnosis not present

## 2017-10-18 DIAGNOSIS — M25561 Pain in right knee: Secondary | ICD-10-CM

## 2017-10-18 DIAGNOSIS — G8929 Other chronic pain: Secondary | ICD-10-CM | POA: Diagnosis not present

## 2017-10-18 DIAGNOSIS — M25562 Pain in left knee: Secondary | ICD-10-CM

## 2017-10-18 DIAGNOSIS — Z79899 Other long term (current) drug therapy: Secondary | ICD-10-CM | POA: Diagnosis not present

## 2017-10-18 DIAGNOSIS — I2510 Atherosclerotic heart disease of native coronary artery without angina pectoris: Secondary | ICD-10-CM | POA: Diagnosis not present

## 2017-10-18 DIAGNOSIS — M545 Low back pain: Secondary | ICD-10-CM | POA: Diagnosis not present

## 2017-10-18 DIAGNOSIS — W19XXXA Unspecified fall, initial encounter: Secondary | ICD-10-CM

## 2017-10-18 DIAGNOSIS — S8991XA Unspecified injury of right lower leg, initial encounter: Secondary | ICD-10-CM | POA: Diagnosis not present

## 2017-10-18 DIAGNOSIS — I10 Essential (primary) hypertension: Secondary | ICD-10-CM | POA: Diagnosis not present

## 2017-10-18 DIAGNOSIS — Z87891 Personal history of nicotine dependence: Secondary | ICD-10-CM | POA: Diagnosis not present

## 2017-10-18 DIAGNOSIS — S8992XA Unspecified injury of left lower leg, initial encounter: Secondary | ICD-10-CM | POA: Diagnosis not present

## 2017-10-18 DIAGNOSIS — Z91040 Latex allergy status: Secondary | ICD-10-CM | POA: Diagnosis not present

## 2017-10-18 MED ORDER — PREDNISONE 10 MG (21) PO TBPK: ORAL_TABLET | Freq: Every day | ORAL | 0 refills | Status: DC

## 2017-10-18 MED ORDER — SODIUM CHLORIDE 0.9 % IV SOLN
510.0000 mg | Freq: Once | INTRAVENOUS | Status: AC
  Administered 2017-10-18: 510 mg via INTRAVENOUS
  Filled 2017-10-18: qty 17

## 2017-10-18 NOTE — Discharge Instructions (Signed)
You were seen in the ER for pain in your knees in your back.  The x-rays of your knees did show arthritis.  We are sending you home with prednisone, this is a steroid, take this as written.  This should help with pain and inflammation.  We have prescribed you new medication(s) today. Discuss the medications prescribed today with your pharmacist as they can have adverse effects and interactions with your other medicines including over the counter and prescribed medications. Seek medical evaluation if you start to experience new or abnormal symptoms after taking one of these medicines, seek care immediately if you start to experience difficulty breathing, feeling of your throat closing, facial swelling, or rash as these could be indications of a more serious allergic reaction We would like you to follow-up with orthopedic doctor for further evaluation.  Please call their office today to schedule an appointment sometime next week.  Return to the ER for new or worsening symptoms or any other concerns.  Additionally see your primary care provider within 1 week for a recheck of your blood pressure as this was elevated in the emergency department. Vitals:   10/18/17 1203  BP: (!) 164/78  Pulse: 68  Resp: 16  Temp: (!) 97.5 F (36.4 C)  SpO2: 100%

## 2017-10-18 NOTE — ED Provider Notes (Addendum)
[facility] EMERGENCY DEPARTMENT Provider Note   CSN: [id number] Arrival date & time: 10/18/17  1125     History   Chief Complaint Chief Complaint  Patient presents with  . Joint Pain    HPI [name] is a 77 y.o. female with a past medical history of CAD status post CABG, hypertension, hyperlipidemia, A. fib on Eliquis, and fibromyalgia who presents to the ED with complaints of her chronic back and bilateral knee pain which has been ongoing for several years. Patient states she is having constant pain. Rates it a as moderate. Worse with walking and movement. No alleviating factors. She did say she had an increase in pain in the R knee when she got out of bed this AM with twisting motion. She also reports fall onto R knee a few weeks ago with increase in pain which improved after steroids. She has been taking her tramadol at home without significant improvement. Denies numbness, weakness, incontinence to bowel/bladder, fever, chills, IV drug use, or hx of cancer.  Patient states she is waiting to see a rheumatologist at this time.  HPI  Past Medical History:  Diagnosis Date  . Anxiety   . Coronary artery disease    a. s/p MI 2009 - PCI/DES distal  RCA w/ 2.75 x 18 Xience DES;  b. 05/2010 Myoview: Apical thinning, EF 73%;  c. 06/2010 Cath: moderate nonobs dzs, EF 65%;  d.  Lexiscan Myoview (07/2013):  No scar or ischemia, EF 67%; Normal Study  . Environmental allergies   . Essential iris atrophy    Right side  . Fatigue   . Fibromyalgia   . GERD (gastroesophageal reflux disease)   . Glaucoma   . Hyperlipidemia   . Hypertension   . Menopause   . Obesity (BMI 30-39.9)   . OSA (obstructive sleep apnea)    does not wear CPAP  . Osteoarthritis   . Oxygen dependent    3L    Patient Active Problem List   Diagnosis Date Noted  . CHF (congestive heart failure) ([place]) 09/28/2017  . Right arm pain 09/26/2016  . Depression 04/29/2016  . Chronic fatigue syndrome  04/29/2016  . SOB (shortness of breath) 04/27/2016  . Aneurysm of right radial artery ([place]) 11/01/2015  . Anxiety state 10/03/2015  . CAD (coronary artery disease) 09/23/2015  . Atrial fibrillation ([place]) 12/19/2014  . Multiple thyroid nodules 01/12/2014  . OSA (obstructive sleep apnea)   . Obesity (BMI 30-39.9)   . Sleep apnea 06/22/2013  . Fibromyalgia   . Coronary artery disease   . Hypertension   . Hyperlipidemia   . Fatigue   . History of myocardial infarction     Past Surgical History:  Procedure Laterality Date  . CARDIAC CATHETERIZATION  06/29/2010   Mild to moderate coronary artery irregularities.  Her  proximal left anterior descending artery is moderately narrowed.  She  also has an eccentric stenosis in the proximal LAD.  These do not appear  to obstruct flow at present, but they are fairly small vessels.  They  appeared to be between 1.5 and 2 mm in diamete  . CARDIAC CATHETERIZATION  08/02/2009   Patent stent  . CARDIAC CATHETERIZATION  05/12/2008   Stent to the distal RCA  . CARDIAC CATHETERIZATION N/A 09/20/2015   Procedure: Left Heart Cath and Coronary Angiography;  Surgeon: Jettie Booze, MD;  Location: [facility];  Service: Cardiovascular;  Laterality: N/A;  . CARDIAC CATHETERIZATION  09/20/2015  Procedure: Intravascular Ultrasound/IVUS;  Surgeon: Jettie Booze, MD;  Location: [facility];  Service: Cardiovascular;;  . CARDIAC CATHETERIZATION  09/20/2015   Procedure: Intravascular Pressure Wire/FFR Study;  Surgeon: Jettie Booze, MD;  Location: [facility];  Service: Cardiovascular;;  . CLIPPING OF ATRIAL APPENDAGE N/A 09/23/2015   Procedure:  CLIPPING OF ATRIAL APPENDAGE;  Surgeon: Grace Isaac, MD;  Location: [facility];  Service: Open Heart Surgery;  Laterality: N/A;  . CORONARY ARTERY BYPASS GRAFT N/A 09/23/2015   Procedure: CORONARY ARTERY BYPASS GRAFTING (CABG) TIMES 1 USING LEFT INTERNAL MAMMARY TO THE LAD;  Surgeon: Grace Isaac, MD;  Location: [facility];  Service: Open Heart Surgery;  Laterality: N/A;  . MAZE N/A 09/23/2015   Procedure: MAZE;  Surgeon: Grace Isaac, MD;  Location: [facility];  Service: Open Heart Surgery;  Laterality: N/A;  . RESECTION OF ARTERIOVENOUS FISTULA ANEURYSM Right 10/21/2015   Procedure: SUTURE REPAIR OF RADIAL ARTERY AND EVACUATION OF HEMATOMA;  Surgeon: Mal Misty, MD;  Location: [facility];  Service: Vascular;  Laterality: Right;  . TEE WITHOUT CARDIOVERSION N/A 09/23/2015   Procedure: TRANSESOPHAGEAL ECHOCARDIOGRAM (TEE);  Surgeon: Grace Isaac, MD;  Location: [facility];  Service: Open Heart Surgery;  Laterality: N/A;     OB History   None      Home Medications    Prior to Admission medications   Medication Sig Start Date End Date Taking? Authorizing Provider  amLODipine (NORVASC) 10 MG tablet Take 1 tablet (10 mg total) by mouth daily. 10/07/17   Nahser, Wonda Cheng, MD  ELIQUIS 5 MG TABS tablet TAKE 1 TABLET(5 MG) BY MOUTH TWICE DAILY 09/30/17   Nahser, Wonda Cheng, MD  ferrous sulfate 325 (65 FE) MG tablet Take 1 tablet (325 mg total) by mouth 2 (two) times daily with a meal. 09/30/17   Thurnell Lose, MD  furosemide (LASIX) 40 MG tablet Take 1 tablet (40 mg total) by mouth 2 (two) times daily. 09/30/17   Thurnell Lose, MD  levothyroxine (SYNTHROID, LEVOTHROID) 75 MCG tablet Take 1 tablet (75 mcg total) by mouth daily before breakfast. 06/01/17   Tereasa Coop, PA-C  metoprolol tartrate (LOPRESSOR) 25 MG tablet Take 1 tablet (25 mg total) by mouth 2 (two) times daily. 02/11/17   Nahser, Wonda Cheng, MD  nitroGLYCERIN (NITROSTAT) 0.4 MG SL tablet Place 1 tablet (0.4 mg total) under the tongue every 5 (five) minutes as needed for chest pain. 02/23/16 11/05/16  Nahser, Wonda Cheng, MD  pantoprazole (PROTONIX) 40 MG tablet Take 40 mg by mouth 2 (two) times daily.  01/07/14   [provider]  potassium chloride 20 MEQ TBCR Take 20 mEq by mouth daily. 09/30/17   Thurnell Lose, MD    pramipexole (MIRAPEX) 1 MG tablet Take 1-3 mg by mouth at bedtime.     [provider]  rosuvastatin (CRESTOR) 5 MG tablet Take 1 tablet (5 mg total) daily by mouth. 03/27/17 06/25/17  Nahser, Wonda Cheng, MD  traMADol (ULTRAM) 50 MG tablet Take one tablet by mouth every 4 hours as needed for moderate pain; Take two tablets by mouth every 4 hours as needed for severe pain 10/06/15   Reed, Tiffany L, DO  venlafaxine (EFFEXOR) 37.5 MG tablet Take 37.5 mg by mouth 2 (two) times daily.    [provider]    Family History Family History  Problem Relation Age of Onset  . Heart attack Mother   .  Cancer Father   . Coronary artery disease Brother        had CABG  . Coronary artery disease Brother   . Coronary artery disease Brother   . Coronary artery disease Brother   . Coronary artery disease Brother   . Heart attack Brother   . Breast cancer Sister     Social History Social History   Tobacco Use  . Smoking status: Former Smoker    Types: Cigarettes    Last attempt to quit: 09/13/1960    Years since quitting: 57.1  . Smokeless tobacco: Never Used  Substance Use Topics  . Alcohol use: No  . Drug use: No     Allergies   Other; Sulfamethoxazole-trimethoprim; Cortisone; Latex; and Tape   Review of Systems Review of Systems  Constitutional: Negative for chills and fever.  Genitourinary: Negative for dysuria, hematuria and urgency.  Musculoskeletal: Positive for arthralgias and back pain.  Neurological: Negative for weakness and numbness.       Negative for saddle anesthesia or incontinence.      Physical Exam Updated Vital Signs BP (!) 164/78 (BP Location: Left Arm)   Pulse 68   Temp (!) 97.5 F (36.4 C)   Resp 16   SpO2 100%   Physical Exam  Constitutional: She appears well-developed and well-nourished. No distress.  HENT:  Head: Normocephalic and atraumatic.  Eyes: Conjunctivae are normal. Right eye exhibits no discharge. Left eye exhibits no  discharge.  Cardiovascular:  2+ symmetric DP pulses.  Musculoskeletal:  No obvious deformity, no obvious swelling, no overlying warmth, no overlying ecchymosis. Back: No midline tenderness to palpation.  Patient has diffuse tenderness to the bilateral lumbar paraspinal muscles extending into the gluteal region. Lower extremities: Patient has normal range of motion to bilateral hips, knees, and ankles.  Patient's knees are diffusely tender, nonfocal.  No other tenderness to palpation to the lower extremities.  Compartments are soft.  Neurological: She is alert.  Clear speech.  Sensation grossly intact bilateral lower extremities.  Patient has 2+ symmetric patellar DTRs.  5 out of 5 strength with plantar and dorsiflexion bilaterally.  5 out of 5 strength with knee flexion and extension bilaterally.  Gait is intact.  Skin:  Patient has multiple small scabbed over areas to her lower extremities, she states these have been there "for a while" and have been improving.  These are without surrounding erythema, overlying warmth, or palpable fluctuance.  Psychiatric: She has a normal mood and affect. Her behavior is normal. Thought content normal.  Nursing note and vitals reviewed.   ED Treatments / Results  Labs (all labs ordered are listed, but only abnormal results are displayed) Labs Reviewed - No data to display  EKG None  Radiology Dg Knee Complete 4 Views Left  Result Date: 10/18/2017 CLINICAL DATA:  Bilateral medial knee pain following a fall 2 weeks ago. EXAM: LEFT KNEE - COMPLETE 4+ VIEW COMPARISON:  Left knee MR dated 01/11/2014. FINDINGS: Mild spur formation involving all 3 joint compartments. Small effusion. No fracture or dislocation seen. IMPRESSION: Mild tricompartmental degenerative changes and small effusion. Electronically Signed   By: Claudie Revering M.D.   On: 10/18/2017 12:53   Dg Knee Complete 4 Views Right  Result Date: 10/18/2017 CLINICAL DATA:  Bilateral medial knee pain  following a fall 2 weeks ago. EXAM: RIGHT KNEE - COMPLETE 4+ VIEW COMPARISON:  09/25/2016. FINDINGS: Mild spur formation involving all 3 joint spaces. Several small calcified intra-articular loose bodies. No fracture, dislocation or  effusion. IMPRESSION: Mild tricompartmental degenerative changes with small loose bodies. Electronically Signed   By: Claudie Revering M.D.   On: 10/18/2017 12:51    Procedures Procedures (including critical care time)  Medications Ordered in ED Medications - No data to display   Initial Impression / Assessment and Plan / ED Course  I have reviewed the triage vital signs and the nursing notes.  Pertinent labs & imaging results that were available during my care of the patient were reviewed by me and considered in my medical decision making (see chart for details).   Patient presents with chronic pain to her bilateral knees and lower back which have been present for several years.  Patient is nontoxic-appearing, in no apparent distress, her blood pressure is elevated, however I doubt HTN emergency, patient aware of need for recheck.   .  She did have an increase in pain in the right knee with a twisting motion, she has had problems like this in the past. Otherwise no significant change in the quality/severity of her discomfort in any of these locations  She does report a fall onto the right knee 3 weeks ago which she was evaluated for at that time.  No subsequent injuries or falls. Patient diffusely tender in the lower back as well as in bilateral knees, normal range of motion, NVI distally.  Compartments are soft.  Regarding back pain- No back pain red flags, chronic and similar to previous. No urinary sxs. Most likely muscle/arthritis related. Considered UTI/pyelonephritis, kidney stone, aortic aneurysm/dissection, cauda equina or epidural abscess however these do not fit clinical picture at this time. Regarding knee pain- no overlying erythema/warmth, afebrile without  systemic sxs- doubt infectious etiology such as septic joint. X-rays obtained: L knee- Mild tricompartmental degenerative changes and small effusion, R knee- Mild tricompartmental degenerative changes with small loose bodies. Likely arthritis. Will give short course of steroids as this has helped patient in the past. I discussed treatment plan, need for ortho follow-up, and return precautions with the patient. Provided opportunity for questions, patient confirmed understanding and is in agreement with plan.   Findings and plan of care discussed with supervising physician Dr. Johnney Killian who is in agreement.   Final Clinical Impressions(s) / ED Diagnoses   Final diagnoses:  Chronic bilateral low back pain, with sciatica presence unspecified  Chronic pain of both knees    ED Discharge Orders        Ordered    predniSONE (STERAPRED UNI-PAK 21 TAB) 10 MG (21) TBPK tablet  Daily     10/18/17 [address] [place] R, PA-C 10/18/17 1423    Amaryllis Dyke, PA-C 10/18/17 1424    Charlesetta Shanks, MD 10/25/17 1535

## 2017-10-18 NOTE — ED Triage Notes (Signed)
Patient complains chronic pain, chronic left knee pain, and new right knee pain. Patient has a referral to a rheumatologists for a diagnosis of arthritis but has not been seen. Patient here complaining of difficultly walking due to knee pain.

## 2017-10-18 NOTE — Progress Notes (Signed)
Pt requested to be seen by a physician for pain from her back into her legs and difficulty walking.  Pt came into the hospital today with these issues and stated she has an appt with a Dr but she is hurting too bad to wait to be seen.  I let her know we did not have a physician in our department and the only option would be to go to the Emergency Room.  Pt said she would like to go to the ER so I left her PIV in her right [facility] in case it was needed and let her know to have them remove it before she leaves the hospital today.  Pt verbalized understanding and pt was taken to the ER by our tech, Mardene Celeste.

## 2017-10-18 NOTE — Discharge Instructions (Signed)

## 2017-10-18 NOTE — ED Notes (Signed)
Pt verbalized understanding discharge instructions and denies any further needs or questions at this time. VS stable, ambulatory and steady gait.   

## 2017-10-30 ENCOUNTER — Ambulatory Visit: Payer: Medicare Other | Admitting: Cardiology

## 2017-10-31 ENCOUNTER — Ambulatory Visit (INDEPENDENT_AMBULATORY_CARE_PROVIDER_SITE_OTHER): Payer: Medicare Other | Admitting: Cardiovascular Disease

## 2017-10-31 ENCOUNTER — Encounter: Payer: Self-pay | Admitting: Cardiovascular Disease

## 2017-10-31 VITALS — BP 126/58 | HR 69 | Ht 65.6 in | Wt 248.0 lb

## 2017-10-31 DIAGNOSIS — E782 Mixed hyperlipidemia: Secondary | ICD-10-CM

## 2017-10-31 DIAGNOSIS — I2510 Atherosclerotic heart disease of native coronary artery without angina pectoris: Secondary | ICD-10-CM | POA: Diagnosis not present

## 2017-10-31 DIAGNOSIS — I5032 Chronic diastolic (congestive) heart failure: Secondary | ICD-10-CM | POA: Diagnosis not present

## 2017-10-31 DIAGNOSIS — I25118 Atherosclerotic heart disease of native coronary artery with other forms of angina pectoris: Secondary | ICD-10-CM

## 2017-10-31 DIAGNOSIS — I200 Unstable angina: Secondary | ICD-10-CM | POA: Diagnosis not present

## 2017-10-31 LAB — CBC
HEMATOCRIT: 32.7 % — AB (ref 34.0–46.6)
HEMOGLOBIN: 10.6 g/dL — AB (ref 11.1–15.9)
MCH: 26.4 pg — ABNORMAL LOW (ref 26.6–33.0)
MCHC: 32.4 g/dL (ref 31.5–35.7)
MCV: 82 fL (ref 79–97)
Platelets: 243 10*3/uL (ref 150–450)
RBC: 4.01 x10E6/uL (ref 3.77–5.28)
RDW: 18.1 % — AB (ref 12.3–15.4)
WBC: 9.4 10*3/uL (ref 3.4–10.8)

## 2017-10-31 LAB — HEPATIC FUNCTION PANEL
ALT: 14 IU/L (ref 0–32)
AST: 21 IU/L (ref 0–40)
Albumin: 4.1 g/dL (ref 3.5–4.8)
Alkaline Phosphatase: 101 IU/L (ref 39–117)
BILIRUBIN, DIRECT: 0.11 mg/dL (ref 0.00–0.40)
Bilirubin Total: 0.3 mg/dL (ref 0.0–1.2)
TOTAL PROTEIN: 7.5 g/dL (ref 6.0–8.5)

## 2017-10-31 LAB — LIPID PANEL
CHOL/HDL RATIO: 3.1 ratio (ref 0.0–4.4)
Cholesterol, Total: 178 mg/dL (ref 100–199)
HDL: 57 mg/dL (ref >39)
LDL Calculated: 96 mg/dL (ref 0–99)
Triglycerides: 125 mg/dL (ref 0–149)
VLDL Cholesterol Cal: 25 mg/dL (ref 5–40)

## 2017-10-31 LAB — BASIC METABOLIC PANEL
BUN/Creatinine Ratio: 23 (ref 12–28)
BUN: 31 mg/dL — AB (ref 8–27)
CALCIUM: 9.9 mg/dL (ref 8.7–10.3)
CHLORIDE: 101 mmol/L (ref 96–106)
CO2: 22 mmol/L (ref 20–29)
Creatinine, Ser: 1.35 mg/dL — ABNORMAL HIGH (ref 0.57–1.00)
GFR calc Af Amer: 44 mL/min/{1.73_m2} — ABNORMAL LOW (ref >59)
GFR calc non Af Amer: 38 mL/min/{1.73_m2} — ABNORMAL LOW (ref >59)
Glucose: 67 mg/dL (ref 65–99)
Potassium: 4.6 mmol/L (ref 3.5–5.2)
Sodium: 138 mmol/L (ref 134–144)

## 2017-10-31 MED ORDER — NITROGLYCERIN 0.4 MG SL SUBL: 0.4000 mg | SUBLINGUAL_TABLET | SUBLINGUAL | 6 refills | Status: AC | PRN

## 2017-10-31 NOTE — H&P (View-Only) (Signed)
Patient Name: [name] Date of Encounter: 10/31/2017  Primary Care Provider:  Alroy Dust, L.Marlou Sa, MD Primary Cardiologist:  Joaquim Nam, MD  Problem List  1. CAD - s/p CABG Sep 22, 2015.  2. Hyperlipidemia 3. Obstructive sleep apnea 4. Hypertension 5. Atrial fib    April 81, 6542:  77 year old female with the above problem list.  She was in usual state of health about 3 weeks ago when she began to note mild lower extremity edema with bilateral ankle and calf pain with ambulation.  She takes her left leg has been more swollen than the right.  She has been relatively active recently partaking and gardening without any chest pain or dyspnea.  Her weight is actually down from baseline.  She's seen no change in her salt intake or amt of time that her legs might be in a dependent position.  She's quite flustered and anxious today.  She misplaced her phone and her dtr initially dropped her off @ the old [place] [facility] office and she had to walk down the street to here.  She doesn't know how to reach her dtr to have her come down and pick her up.  She's tearful and emotionally upset.  In that setting, after the visit, pt said that she thinks she's having chest pain.  An ecg was done, and before it was even completed, c/p resolved, as did her emotional upset.  Oct 08, 2012:  [name] is doing well. She's not had any episodes of chest pain or shortness of breath. She was seen by our PA last year. She has a history of coronary artery disease but has not had any recurrent angina. She's been working out in her garden without difficulties.  She has occasional episodes of orthostatic hypotension and also notes some occasional leg edema. I suspect this is from the amlodipine.  October 20, 2013:  [name] has seen [name] and was referred to [name] for management of her sleep apnea.   Her BP has been well controlled.  She needs to have a special dental appliance made for her OSA- did not tolerate  the CPAP. She has chronic dyspnea. No CP   She has found to have some thyroid issues.  Sees Dr. Alroy Dust for this.     December 29, 2014:  [name] is seen back today for evaulation of her CAD and HTN  And a recent episode of atrial fib. She was admitted and converted back to normal  ( ? No CP , BP has been well controlled    July 29, 2015:  Has chronic fatigue  Has had thyroid issues Doing well from a cardiac standpoint   Oct. 5, 2017:  [name] presented to the hospital with CP Sep 19, 2015. Cath showed prox LAD disease that was not amenable to PCI [name] had CABG  And MAZE procedure Sep 22, 2015. Complicated by a right radial hematoma - was eventually fixed on October 21, 2015   Nov. 15, 2017  [name] is doing well. She had coronary artery bypass grafting in May 2017. She has a history of atrial fibrillation and also had a MAZE  procedure with the coronary artery bypass grafting. We stopped her amiodarone at her last visit.  Feb. 26, 2018:  Doing ok Has had some leg swelling and BP is elevated.  Uses some salt .  No CP . Has had some DOE . Echo in Dec. 2017 shows normal LV systolic function with grade 2 Diastolic dysfunction  Sep 19, 2016: [name] is here to follow up with her CAD - she status post coronary artery bypass grafting ( LIMA to LAD + MAZE procedure )  in 2017. She had a Maze procedure at that time.  Is having some left leg edema .  trjied 3 days of double dose lasix - did not seem to help  Echo in Dec. Showed normal LV function with grade 2 DD.   Jan. 11, 2019 Seen today as a work in visit for CP . Occurs when she hiccups or swallow or if she turns her torso a certain way . Worse with eating / swallowing  Feels sore.   No angina pain that is similar to her pre-CABG pain  Has a tender and swollen left leg   August 22, 2017:   [name] is seen today for follow-up visit.  Her blood pressure has been markedly elevated recently. BP has been markedly elevated.  170 / 60  frequently  Has gained weight -   Wt is 254 lbs.  Today  Wt 248   On 05/31/17 Wt 222 lbs  On 09/19/16  Feels terrible.   More short of breath with any activity .  Hurts all aover with arthritis .   Living with friends and family .   Does not have a stable living situation .    Driving [employer]  - eating more take out, fast foods Has hypothyroidism    - is on Synthroid 75 mcg a day  Had TSH drawn yesterday by primary MD  - normal at 3.81  October 31, 2017:  [name] was in the hospital in May.  She had developed congestive heart failure. She received IV Lasix.  She diuresed 3.5 L. Her last echocardiogram was in December, 2017.  She has normal left ventricular systolic function.  She has grade 2 diastolic dysfunction.  She has moderate aortic insufficiency.  Not driving much with [employer] anymore.   Eating better.    Was found to have iron defeciency anemia .    Hb was 8.3 in the hospital .  Increased up to 9.   Was given iron supplements but she has not started yet.     She had some CP / burning in her chest last week while walking up the stop Radiated down both arms and throat ,  Associated with dyspnea. Has occurred 3 days in a row  Felt like previous angina Has not occurred since then     Past Medical History:  Diagnosis Date  . Anxiety   . Coronary artery disease    a. s/p MI 2009 - PCI/DES distal  RCA w/ 2.75 x 18 Xience DES;  b. 05/2010 Myoview: Apical thinning, EF 73%;  c. 06/2010 Cath: moderate nonobs dzs, EF 65%;  d.  Lexiscan Myoview (07/2013):  No scar or ischemia, EF 67%; Normal Study  . Environmental allergies   . Essential iris atrophy    Right side  . Fatigue   . Fibromyalgia   . GERD (gastroesophageal reflux disease)   . Glaucoma   . Hyperlipidemia   . Hypertension   . Menopause   . Obesity (BMI 30-39.9)   . OSA (obstructive sleep apnea)    does not wear CPAP  . Osteoarthritis   . Oxygen dependent    3L   Past Surgical History:  Procedure Laterality Date  . CARDIAC  CATHETERIZATION  06/29/2010   Mild to moderate coronary artery irregularities.  Her  proximal left anterior descending artery  is moderately narrowed.  She  also has an eccentric stenosis in the proximal LAD.  These do not appear  to obstruct flow at present, but they are fairly small vessels.  They  appeared to be between 1.5 and 2 mm in diamete  . CARDIAC CATHETERIZATION  08/02/2009   Patent stent  . CARDIAC CATHETERIZATION  05/12/2008   Stent to the distal RCA  . CARDIAC CATHETERIZATION N/A 09/20/2015   Procedure: Left Heart Cath and Coronary Angiography;  Surgeon: Jettie Booze, MD;  Location: [facility];  Service: Cardiovascular;  Laterality: N/A;  . CARDIAC CATHETERIZATION  09/20/2015   Procedure: Intravascular Ultrasound/IVUS;  Surgeon: Jettie Booze, MD;  Location: [place] [facility];  Service: Cardiovascular;;  . CARDIAC CATHETERIZATION  09/20/2015   Procedure: Intravascular Pressure Wire/FFR Study;  Surgeon: Jettie Booze, MD;  Location: [facility] [facility] [facility];  Service: Cardiovascular;;  . CLIPPING OF ATRIAL APPENDAGE N/A 09/23/2015   Procedure:  CLIPPING OF ATRIAL APPENDAGE;  Surgeon: Grace Isaac, MD;  Location: [facility];  Service: Open Heart Surgery;  Laterality: N/A;  . CORONARY ARTERY BYPASS GRAFT N/A 09/23/2015   Procedure: CORONARY ARTERY BYPASS GRAFTING (CABG) TIMES 1 USING LEFT INTERNAL MAMMARY TO THE LAD;  Surgeon: Grace Isaac, MD;  Location: [facility];  Service: Open Heart Surgery;  Laterality: N/A;  . MAZE N/A 09/23/2015   Procedure: MAZE;  Surgeon: Grace Isaac, MD;  Location: [facility];  Service: Open Heart Surgery;  Laterality: N/A;  . RESECTION OF ARTERIOVENOUS FISTULA ANEURYSM Right 10/21/2015   Procedure: SUTURE REPAIR OF RADIAL ARTERY AND EVACUATION OF HEMATOMA;  Surgeon: Mal Misty, MD;  Location: [facility];  Service: Vascular;  Laterality: Right;  . TEE WITHOUT CARDIOVERSION N/A 09/23/2015   Procedure: TRANSESOPHAGEAL ECHOCARDIOGRAM (TEE);  Surgeon: Grace Isaac, MD;  Location: [facility];  Service: Open Heart Surgery;  Laterality: N/A;    Allergies  Allergies  Allergen Reactions  . Other Nausea Only    NO MEAT!!  . Sulfamethoxazole-Trimethoprim Nausea Only  . Cortisone Nausea Only and Other (See Comments)    Pt gets very hot and flushed  . Latex Rash    Redness and irritation  . Tape Rash    Redness and irritation      Home Medications  Prior to Admission medications   Medication Sig Start Date End Date Taking? Authorizing Provider  acetaminophen (TYLENOL) 500 MG tablet Take 500 mg by mouth as needed.    Yes Historical Provider, MD  amitriptyline (ELAVIL) 10 MG tablet Take 10 mg by mouth at bedtime.   Yes Historical Provider, MD  amLODipine (NORVASC) 5 MG tablet Take 1 tablet (5 mg total) by mouth daily. 09/03/11 09/02/12 Yes Thayer Headings, MD  aspirin 325 MG tablet Take 325 mg by mouth daily.     Yes Historical Provider, MD  carvedilol (COREG) 12.5 MG tablet Take 1 tablet (12.5 mg total) by mouth 2 (two) times daily. 09/03/11 09/02/12 Yes Thayer Headings, MD  clopidogrel (PLAVIX) 75 MG tablet TAKE ONE TABLET BY MOUTH DAILY 07/06/11  Yes Burtis Junes, NP  diclofenac sodium (VOLTAREN) 1 % GEL Apply topically. **CREAM** use as directed    Yes Historical Provider, MD  fluticasone (FLONASE) 50 MCG/ACT nasal spray Place 1 spray into the nose daily.   Yes Historical Provider, MD  hydrochlorothiazide 25 MG tablet Take 25 mg by mouth daily.    Yes Historical Provider, MD  HYDROcodone-homatropine (HYCODAN) 5-1.5 MG/5ML  syrup Take 5 mLs by mouth every 8 (eight) hours as needed for cough. 08/29/11 09/08/11 Yes Robyn Haber, MD  IRON PO Take 1 tablet by mouth daily.     Yes Historical Provider, MD  levofloxacin (LEVAQUIN) 500 MG tablet Take 1 tablet (500 mg total) by mouth daily. 08/29/11 09/08/11 Yes Robyn Haber, MD  methimazole (TAPAZOLE) 5 MG tablet Take 5 mg by mouth daily.   Yes Historical Provider, MD  nitroGLYCERIN (NITROSTAT) 0.4  MG SL tablet Place 0.4 mg under the tongue every 5 (five) minutes as needed.     Yes Historical Provider, MD  pantoprazole (PROTONIX) 40 MG tablet Take 40 mg by mouth 2 (two) times daily.    Yes Historical Provider, MD  potassium chloride (KLOR-CON) 10 MEQ CR tablet Take 1 tablet (10 mEq total) by mouth daily. 02/23/11  Yes Thayer Headings, MD  pramipexole (MIRAPEX) 0.25 MG tablet Take 1 mg by mouth at bedtime.    Yes Historical Provider, MD  rosuvastatin (CRESTOR) 10 MG tablet Take 1 tablet (10 mg total) by mouth daily. 06/06/11  Yes Thayer Headings, MD  traMADol (ULTRAM) 50 MG tablet Take 50 mg by mouth 3 (three) times daily as needed.    Yes Historical Provider, MD  venlafaxine (EFFEXOR) 75 MG tablet Take 75 mg by mouth daily. 1/2 tablet 2 (two) times daily   Yes Historical Provider, MD  lisinopril (PRINIVIL,ZESTRIL) 40 MG tablet Take 1 tablet (40 mg total) by mouth daily. 09/07/11 09/06/12  Thayer Headings, MD    Review of systems: Review of systems was reviewed and current history.  Otherwise review of systems is negative.  Physical Exam: Blood pressure (!) 126/58, pulse 69, height 5' 5.6" (1.666 m), weight 248 lb (112.5 kg), SpO2 97 %.  GEN:  Well nourished, well developed in no acute distress HEENT: Normal NECK: No JVD; No carotid bruits LYMPHATICS: No lymphadenopathy CARDIAC: RRR, no murmurs, rubs, gallops RESPIRATORY:  Clear to auscultation without rales, wheezing or rhonchi  ABDOMEN: Soft, non-tender, non-distended MUSCULOSKELETAL:  No edema; No deformity  SKIN: Warm and dry NEUROLOGIC:  Alert and oriented x 3   ECG: October 31, 2017: Normal sinus rhythm at 69.  Normal EKG.   Assessment and Plan   1.  CAD : Patient had several episodes of chest discomfort last week associated with climbing the stairs.  This is consistent with unstable angina.  Her symptoms were very similar to her previous episodes of angina several years ago.  She has a history of bypass grafting.  I do not  think that a Myoview study would be useful.  I think that we will need to proceed with heart catheterization.  We will check labs today.  We will schedule her for heart cath either tomorrow or early next week.  We discussed the risks, benefits, options of heart cath.  She understands and agrees to proceed.  1. Chronic diastolic congestive heart failure.     Improved.    2.   Essential hypertension: Her blood pressure seems to be doing much better.  Continue current medications..   3.  Generalized fatigue:   She was found to have iron deficient anemia.  She is been given iron tablets but she has not picked them up at the drugstore yet. She is scheduled to see a gastroenterologist soon for a colonoscopy.  3. CAD:  No CP   3. Hyperlipidemia -        Check labs today   4. Obstructive  sleep apnea-   Getting a dental device for her OSA   5. Hypertension - -   5. Atrial fib -  In NSR        6. Left leg edema :  .     Mertie Moores, MD  10/31/2017 9:12 AM    [address],  [address] [place], [place]  [address] Pager [phone] Phone: [phone]; Fax: [phone]

## 2017-10-31 NOTE — Progress Notes (Signed)
Patient Name: [name] Date of Encounter: 10/31/2017  Primary Care Provider:  Alroy Dust, L.Marlou Sa, MD Primary Cardiologist:  Joaquim Nam, MD  Problem List  1. CAD - s/p CABG Sep 22, 2015.  2. Hyperlipidemia 3. Obstructive sleep apnea 4. Hypertension 5. Atrial fib    April [birth year], [address]:  77 year old female with the above problem list.  She was in usual state of health about 3 weeks ago when she began to note mild lower extremity edema with bilateral ankle and calf pain with ambulation.  She takes her left leg has been more swollen than the right.  She has been relatively active recently partaking and gardening without any chest pain or dyspnea.  Her weight is actually down from baseline.  She's seen no change in her salt intake or amt of time that her legs might be in a dependent position.  She's quite flustered and anxious today.  She misplaced her phone and her dtr initially dropped her off @ the old [place] cardiology office and she had to walk down the street to here.  She doesn't know how to reach her dtr to have her come down and pick her up.  She's tearful and emotionally upset.  In that setting, after the visit, pt said that she thinks she's having chest pain.  An ecg was done, and before it was even completed, c/p resolved, as did her emotional upset.  Oct 08, 2012:  [name] is doing well. She's not had any episodes of chest pain or shortness of breath. She was seen by our PA last year. She has a history of coronary artery disease but has not had any recurrent angina. She's been working out in her garden without difficulties.  She has occasional episodes of orthostatic hypotension and also notes some occasional leg edema. I suspect this is from the amlodipine.  October 20, 2013:  [name] has seen [name] and was referred to [name] for management of her sleep apnea.   Her BP has been well controlled.  She needs to have a special dental appliance made for her OSA- did not tolerate  the CPAP. She has chronic dyspnea. No CP   She has found to have some thyroid issues.  Sees Dr. Alroy Dust for this.     December 29, 2014:  [name] is seen back today for evaulation of her CAD and HTN  And a recent episode of atrial fib. She was admitted and converted back to normal  ( ? No CP , BP has been well controlled    July 29, 2015:  Has chronic fatigue  Has had thyroid issues Doing well from a cardiac standpoint   Oct. 5, 2017:  [name] presented to the hospital with CP Sep 19, 2015. Cath showed prox LAD disease that was not amenable to PCI [name] had CABG  And MAZE procedure Sep 22, 2015. Complicated by a right radial hematoma - was eventually fixed on October 21, 2015   Nov. 15, 2017  [name] is doing well. She had coronary artery bypass grafting in May 2017. She has a history of atrial fibrillation and also had a MAZE  procedure with the coronary artery bypass grafting. We stopped her amiodarone at her last visit.  Feb. 26, 2018:  Doing ok Has had some leg swelling and BP is elevated.  Uses some salt .  No CP . Has had some DOE . Echo in Dec. 2017 shows normal LV systolic function with grade 2 Diastolic dysfunction  Sep 19, 2016: [name] is here to follow up with her CAD - she status post coronary artery bypass grafting ( LIMA to LAD + MAZE procedure )  in 2017. She had a Maze procedure at that time.  Is having some left leg edema .  trjied 3 days of double dose lasix - did not seem to help  Echo in Dec. Showed normal LV function with grade 2 DD.   Jan. 11, 2019 Seen today as a work in visit for CP . Occurs when she hiccups or swallow or if she turns her torso a certain way . Worse with eating / swallowing  Feels sore.   No angina pain that is similar to her pre-CABG pain  Has a tender and swollen left leg   August 22, 2017:   [name] is seen today for follow-up visit.  Her blood pressure has been markedly elevated recently. BP has been markedly elevated.  170 / 60  frequently  Has gained weight -   Wt is 254 lbs.  Today  Wt 248   On 05/31/17 Wt 222 lbs  On 09/19/16  Feels terrible.   More short of breath with any activity .  Hurts all aover with arthritis .   Living with friends and family .   Does not have a stable living situation .    Driving [employer]  - eating more take out, fast foods Has hypothyroidism    - is on Synthroid 75 mcg a day  Had TSH drawn yesterday by primary MD  - normal at 3.81  October 31, 2017:  [name] was in the hospital in May.  She had developed congestive heart failure. She received IV Lasix.  She diuresed 3.5 L. Her last echocardiogram was in December, 2017.  She has normal left ventricular systolic function.  She has grade 2 diastolic dysfunction.  She has moderate aortic insufficiency.  Not driving much with [employer] anymore.   Eating better.    Was found to have iron defeciency anemia .    Hb was 8.3 in the hospital .  Increased up to 9.   Was given iron supplements but she has not started yet.     She had some CP / burning in her chest last week while walking up the stop Radiated down both arms and throat ,  Associated with dyspnea. Has occurred 3 days in a row  Felt like previous angina Has not occurred since then     Past Medical History:  Diagnosis Date  . Anxiety   . Coronary artery disease    a. s/p MI 2009 - PCI/DES distal  RCA w/ 2.75 x 18 Xience DES;  b. 05/2010 Myoview: Apical thinning, EF 73%;  c. 06/2010 Cath: moderate nonobs dzs, EF 65%;  d.  Lexiscan Myoview (07/2013):  No scar or ischemia, EF 67%; Normal Study  . Environmental allergies   . Essential iris atrophy    Right side  . Fatigue   . Fibromyalgia   . GERD (gastroesophageal reflux disease)   . Glaucoma   . Hyperlipidemia   . Hypertension   . Menopause   . Obesity (BMI 30-39.9)   . OSA (obstructive sleep apnea)    does not wear CPAP  . Osteoarthritis   . Oxygen dependent    3L   Past Surgical History:  Procedure Laterality Date  . CARDIAC  CATHETERIZATION  06/29/2010   Mild to moderate coronary artery irregularities.  Her  proximal left anterior descending artery  is moderately narrowed.  She  also has an eccentric stenosis in the proximal LAD.  These do not appear  to obstruct flow at present, but they are fairly small vessels.  They  appeared to be between 1.5 and 2 mm in diamete  . CARDIAC CATHETERIZATION  08/02/2009   Patent stent  . CARDIAC CATHETERIZATION  05/12/2008   Stent to the distal RCA  . CARDIAC CATHETERIZATION N/A 09/20/2015   Procedure: Left Heart Cath and Coronary Angiography;  Surgeon: Jettie Booze, MD;  Location: [facility];  Service: Cardiovascular;  Laterality: N/A;  . CARDIAC CATHETERIZATION  09/20/2015   Procedure: Intravascular Ultrasound/IVUS;  Surgeon: Jettie Booze, MD;  Location: [facility];  Service: Cardiovascular;;  . CARDIAC CATHETERIZATION  09/20/2015   Procedure: Intravascular Pressure Wire/FFR Study;  Surgeon: Jettie Booze, MD;  Location: [facility];  Service: Cardiovascular;;  . CLIPPING OF ATRIAL APPENDAGE N/A 09/23/2015   Procedure:  CLIPPING OF ATRIAL APPENDAGE;  Surgeon: Grace Isaac, MD;  Location: [facility];  Service: Open Heart Surgery;  Laterality: N/A;  . CORONARY ARTERY BYPASS GRAFT N/A 09/23/2015   Procedure: CORONARY ARTERY BYPASS GRAFTING (CABG) TIMES 1 USING LEFT INTERNAL MAMMARY TO THE LAD;  Surgeon: Grace Isaac, MD;  Location: [facility];  Service: Open Heart Surgery;  Laterality: N/A;  . MAZE N/A 09/23/2015   Procedure: MAZE;  Surgeon: Grace Isaac, MD;  Location: [facility];  Service: Open Heart Surgery;  Laterality: N/A;  . RESECTION OF ARTERIOVENOUS FISTULA ANEURYSM Right 10/21/2015   Procedure: SUTURE REPAIR OF RADIAL ARTERY AND EVACUATION OF HEMATOMA;  Surgeon: Mal Misty, MD;  Location: [facility];  Service: Vascular;  Laterality: Right;  . TEE WITHOUT CARDIOVERSION N/A 09/23/2015   Procedure: TRANSESOPHAGEAL ECHOCARDIOGRAM (TEE);  Surgeon: Grace Isaac, MD;  Location: [facility];  Service: Open Heart Surgery;  Laterality: N/A;    Allergies  Allergies  Allergen Reactions  . Other Nausea Only    NO MEAT!!  . Sulfamethoxazole-Trimethoprim Nausea Only  . Cortisone Nausea Only and Other (See Comments)    Pt gets very hot and flushed  . Latex Rash    Redness and irritation  . Tape Rash    Redness and irritation      Home Medications  Prior to Admission medications   Medication Sig Start Date End Date Taking? Authorizing Provider  acetaminophen (TYLENOL) 500 MG tablet Take 500 mg by mouth as needed.    Yes Historical Provider, MD  amitriptyline (ELAVIL) 10 MG tablet Take 10 mg by mouth at bedtime.   Yes Historical Provider, MD  amLODipine (NORVASC) 5 MG tablet Take 1 tablet (5 mg total) by mouth daily. 09/03/11 09/02/12 Yes Thayer Headings, MD  aspirin 325 MG tablet Take 325 mg by mouth daily.     Yes Historical Provider, MD  carvedilol (COREG) 12.5 MG tablet Take 1 tablet (12.5 mg total) by mouth 2 (two) times daily. 09/03/11 09/02/12 Yes Thayer Headings, MD  clopidogrel (PLAVIX) 75 MG tablet TAKE ONE TABLET BY MOUTH DAILY 07/06/11  Yes Burtis Junes, NP  diclofenac sodium (VOLTAREN) 1 % GEL Apply topically. **CREAM** use as directed    Yes Historical Provider, MD  fluticasone (FLONASE) 50 MCG/ACT nasal spray Place 1 spray into the nose daily.   Yes Historical Provider, MD  hydrochlorothiazide 25 MG tablet Take 25 mg by mouth daily.    Yes Historical Provider, MD  HYDROcodone-homatropine (HYCODAN) 5-1.5 MG/5ML  syrup Take 5 mLs by mouth every 8 (eight) hours as needed for cough. 08/29/11 09/08/11 Yes Robyn Haber, MD  IRON PO Take 1 tablet by mouth daily.     Yes Historical Provider, MD  levofloxacin (LEVAQUIN) 500 MG tablet Take 1 tablet (500 mg total) by mouth daily. 08/29/11 09/08/11 Yes Robyn Haber, MD  methimazole (TAPAZOLE) 5 MG tablet Take 5 mg by mouth daily.   Yes Historical Provider, MD  nitroGLYCERIN (NITROSTAT) 0.4  MG SL tablet Place 0.4 mg under the tongue every 5 (five) minutes as needed.     Yes Historical Provider, MD  pantoprazole (PROTONIX) 40 MG tablet Take 40 mg by mouth 2 (two) times daily.    Yes Historical Provider, MD  potassium chloride (KLOR-CON) 10 MEQ CR tablet Take 1 tablet (10 mEq total) by mouth daily. 02/23/11  Yes Thayer Headings, MD  pramipexole (MIRAPEX) 0.25 MG tablet Take 1 mg by mouth at bedtime.    Yes Historical Provider, MD  rosuvastatin (CRESTOR) 10 MG tablet Take 1 tablet (10 mg total) by mouth daily. 06/06/11  Yes Thayer Headings, MD  traMADol (ULTRAM) 50 MG tablet Take 50 mg by mouth 3 (three) times daily as needed.    Yes Historical Provider, MD  venlafaxine (EFFEXOR) 75 MG tablet Take 75 mg by mouth daily. 1/2 tablet 2 (two) times daily   Yes Historical Provider, MD  lisinopril (PRINIVIL,ZESTRIL) 40 MG tablet Take 1 tablet (40 mg total) by mouth daily. 09/07/11 09/06/12  Thayer Headings, MD    Review of systems: Review of systems was reviewed and current history.  Otherwise review of systems is negative.  Physical Exam: Blood pressure (!) 126/58, pulse 69, height 5' 5.6" (1.666 m), weight 248 lb (112.5 kg), SpO2 97 %.  GEN:  Well nourished, well developed in no acute distress HEENT: Normal NECK: No JVD; No carotid bruits LYMPHATICS: No lymphadenopathy CARDIAC: RRR, no murmurs, rubs, gallops RESPIRATORY:  Clear to auscultation without rales, wheezing or rhonchi  ABDOMEN: Soft, non-tender, non-distended MUSCULOSKELETAL:  No edema; No deformity  SKIN: Warm and dry NEUROLOGIC:  Alert and oriented x 3   ECG: October 31, 2017: Normal sinus rhythm at 69.  Normal EKG.   Assessment and Plan   1.  CAD : Patient had several episodes of chest discomfort last week associated with climbing the stairs.  This is consistent with unstable angina.  Her symptoms were very similar to her previous episodes of angina several years ago.  She has a history of bypass grafting.  I do not  think that a Myoview study would be useful.  I think that we will need to proceed with heart catheterization.  We will check labs today.  We will schedule her for heart cath either tomorrow or early next week.  We discussed the risks, benefits, options of heart cath.  She understands and agrees to proceed.  1. Chronic diastolic congestive heart failure.     Improved.    2.   Essential hypertension: Her blood pressure seems to be doing much better.  Continue current medications..   3.  Generalized fatigue:   She was found to have iron deficient anemia.  She is been given iron tablets but she has not picked them up at the drugstore yet. She is scheduled to see a gastroenterologist soon for a colonoscopy.  3. CAD:  No CP   3. Hyperlipidemia -        Check labs today   4. Obstructive  sleep apnea-   Getting a dental device for her OSA   5. Hypertension - -   5. Atrial fib -  In NSR        6. Left leg edema :  .     Mertie Moores, MD  10/31/2017 9:12 AM    [address],  [place], [place]  [address] Pager [phone] Phone: [phone]; Fax: [phone]

## 2017-10-31 NOTE — Patient Instructions (Addendum)
Medication Instructions:  Your physician recommends that you continue on your current medications as directed. Please refer to the Current Medication list given to you today.   Labwork: TODAY - cholesterol, liver panel, basic metabolic panel, CBC   Testing/Procedures: None Ordered   Follow-Up: Your physician recommends that you schedule a follow-up appointment in: 6 weeks with APP on Dr. Elmarie Shiley team     [facility] CARDIOVASCULAR DIVISION [place] OFFICE [address], [address] [place] [address] Dept: [phone] Loc: [phone]  [name]  10/31/2017  You are scheduled for a Cardiac Catheterization on Tuesday, June 18 with Dr. Peter Martinique.  1. Please arrive at the [facility] (Main Entrance A) at [facility]: [place], [place] [address] at 10:00 AM (two hours before your procedure to ensure your preparation). Free valet parking service is available.   Special note: Every effort is made to have your procedure done on time. Please understand that emergencies sometimes delay scheduled procedures.  2. Diet: No solid food after midnight. You may have clear liquids (water, gatorade, ginger ale or other clear liquids without pulp and not dairy) until 5 am on the morning of your procedure  3. Labs: You will need to have blood drawn on Thursday, June 13 at [facility] at [facility].   4. Medication instructions in preparation for your procedure:   Stop taking Eliquis (Apixiban) on Saturday, June 16.after your evening dose  DO NOT TAKE LASIX (Furosemide) on the morning of your procedure     On the morning of your procedure, take your Aspirin 81 mg and any morning medicines NOT listed above.  You may use sips of water.  5. Plan for one night stay--bring personal belongings. 6. Bring a current list of your medications and current insurance cards. 7. You MUST have a responsible person to drive you  home. 8. Someone MUST be with you the first 24 hours after you arrive home or your discharge will be delayed. 9. Please wear clothes that are easy to get on and off and wear slip-on shoes.  Thank you for allowing [place] to care for you!   -- [facility]   If you need a refill on your cardiac medications before your next appointment, please call your pharmacy.   Thank you for choosing [facility]! Christen Bame, RN [phone]

## 2017-11-04 ENCOUNTER — Telehealth: Payer: Self-pay | Admitting: *Deleted

## 2017-11-04 DIAGNOSIS — Z01812 Encounter for preprocedural laboratory examination: Secondary | ICD-10-CM

## 2017-11-04 DIAGNOSIS — I25810 Atherosclerosis of coronary artery bypass graft(s) without angina pectoris: Secondary | ICD-10-CM

## 2017-11-04 NOTE — Addendum Note (Signed)
Addended by: De Burrs on: 11/04/2017 08:30 AM   Modules accepted: Orders

## 2017-11-04 NOTE — Telephone Encounter (Signed)
Pt called back and will go to PCP between 2:30-4 PM today for evaluation of UTI symptoms.

## 2017-11-04 NOTE — Telephone Encounter (Signed)
I spoke with Anderson Malta at Dr Virgilio Belling office-pt does have UTI, Dr Alroy Dust is prescribing Cipro 500mg  bid for 5 days, will send urine for culture.  Pt advised I will review with Dr Acie Fredrickson and follow-up with her today about timing of cath.

## 2017-11-04 NOTE — Telephone Encounter (Signed)
Agree to reschedule cath for next Tuesday

## 2017-11-04 NOTE — Telephone Encounter (Addendum)
Catheterization scheduled at [facility] for: Tuesday November 05, 2017 12 noon Verify arrival time and place: [place] Entrance A at: 7 AM--for pre- procedure hydration  No solid food after midnight prior to cath, clear liquids until 5 AM day of procedure. Verify allergies in Epic Verify no diabetes medications.  Hold: Apixaban -11/03/17 until post procedure. Furosemide- day before and day of procedure. KCl-day before and day of procedure.  Except hold medications AM meds can be  taken pre-cath with sip of water including: ASA 81 mg  Confirm patient has responsible person to drive home post procedure and observe patient for 24 hours-yes  Note:  I discussed instructions with patient, including going in at 7 AM for pre-procedure hydration (GFR 38). Pt states she has had symptoms of burning on urination for several days. Pt states she does have incontinence that is not a new symptom, she denies any other urinary symptoms, including urgency. Pt states she was having  these same symptoms when she saw Dr Acie Fredrickson 10/31/17 but forgot to discuss with him (10/31/17 WBC 9.4) Pt states she is going to try to go to PCP today for urinalysis and evaluation of urinary symptoms. Pt advised I will follow-up with her later today to see if she was able to see her PCP for evaluation.  I will forward to Dr Acie Fredrickson for review and recommendations regarding timing of procedure.

## 2017-11-04 NOTE — Telephone Encounter (Signed)
Reviewed with Dr Clarita Crane cancel cardiac cath for tomorrow, reschedule to 11/12/17 12 noon Dr Tamala Julian, restart Apixaban tonight.  I spoke with patient, she is aware Dr Acie Fredrickson recommended reschedule cath to 11/12/17, I will follow-up with her tomorrow with instructions for cath on 11/12/17.  Pt verbalized understanding and agreed with this plan.

## 2017-11-05 NOTE — Telephone Encounter (Signed)
  [place] OFFICE [address] [place] [address] [place] [address] Dept: [phone] Loc: [place]  11/05/2017  You are scheduled for a Cardiac Catheterization on Tuesday, June 25 with Dr. Daneen Schick.  1. Please arrive at the [facility] (Main Entrance A) at [facility]: [place], [place] [address] at 10:00 AM (two hours before your procedure to ensure your preparation). Free valet parking service is available.   Special note: Every effort is made to have your procedure done on time. Please understand that emergencies sometimes delay scheduled procedures.  2. Diet: No solid food after midnight prior to procedure, clear liquids until 5 AM day of procedure.  3. Labs: Friday November 08, 2017  4. Medication instructions in preparation for your procedure:  Do not take furosemide/potassium morning of procedure.  Take your last dose of Eliquis (apixaban) the evening of  Saturday June 22,2019  On the morning of your procedure, take Aspirin 81 mg and any morning medicines NOT listed above.  You may use sips of water.  5. Plan for one night stay--bring personal belongings. 6. Bring a current list of your medications and current insurance cards. 7. You MUST have a responsible person to drive you home. 8. Someone MUST be with you the first 24 hours after you arrive home or your discharge will be delayed. 9. Please wear clothes that are easy to get on and off and wear slip-on shoes.  Thank you for allowing [place] to care for you!   -- [facility]

## 2017-11-08 ENCOUNTER — Other Ambulatory Visit: Payer: Medicare Other

## 2017-11-11 ENCOUNTER — Telehealth: Payer: Self-pay | Admitting: *Deleted

## 2017-11-11 ENCOUNTER — Other Ambulatory Visit: Payer: Medicare Other | Admitting: *Deleted

## 2017-11-11 DIAGNOSIS — I2510 Atherosclerotic heart disease of native coronary artery without angina pectoris: Secondary | ICD-10-CM

## 2017-11-11 DIAGNOSIS — Z01812 Encounter for preprocedural laboratory examination: Secondary | ICD-10-CM

## 2017-11-11 LAB — CBC WITH DIFFERENTIAL/PLATELET
BASOS ABS: 0 10*3/uL (ref 0.0–0.2)
BASOS: 0 %
EOS (ABSOLUTE): 0.2 10*3/uL (ref 0.0–0.4)
Eos: 3 %
HEMATOCRIT: 31.5 % — AB (ref 34.0–46.6)
Hemoglobin: 10.2 g/dL — ABNORMAL LOW (ref 11.1–15.9)
LYMPHS ABS: 2.2 10*3/uL (ref 0.7–3.1)
LYMPHS: 25 %
MCH: 26.2 pg — ABNORMAL LOW (ref 26.6–33.0)
MCHC: 32.4 g/dL (ref 31.5–35.7)
MCV: 81 fL (ref 79–97)
MONOCYTES: 9 %
Monocytes Absolute: 0.8 10*3/uL (ref 0.1–0.9)
Neutrophils Absolute: 5.7 10*3/uL (ref 1.4–7.0)
Neutrophils: 63 %
PLATELETS: 199 10*3/uL (ref 150–450)
RBC: 3.9 x10E6/uL (ref 3.77–5.28)
RDW: 20.7 % — AB (ref 12.3–15.4)
WBC: 8.9 10*3/uL (ref 3.4–10.8)

## 2017-11-11 LAB — BASIC METABOLIC PANEL
BUN / CREAT RATIO: 19 (ref 12–28)
BUN: 23 mg/dL (ref 8–27)
CALCIUM: 9.8 mg/dL (ref 8.7–10.3)
CHLORIDE: 102 mmol/L (ref 96–106)
CO2: 27 mmol/L (ref 20–29)
Creatinine, Ser: 1.18 mg/dL — ABNORMAL HIGH (ref 0.57–1.00)
GFR, EST AFRICAN AMERICAN: 52 mL/min/{1.73_m2} — AB (ref >59)
GFR, EST NON AFRICAN AMERICAN: 45 mL/min/{1.73_m2} — AB (ref >59)
Glucose: 97 mg/dL (ref 65–99)
POTASSIUM: 4.3 mmol/L (ref 3.5–5.2)
Sodium: 138 mmol/L (ref 134–144)

## 2017-11-11 NOTE — Telephone Encounter (Signed)
Follow up     Patient is calling back in reference to her upcoming procedure. Please call.

## 2017-11-11 NOTE — Telephone Encounter (Signed)
I spoke with patient, she forgot to come for pre-procedure lab 11/08/17, she will come this morning, I have ordered BMP/CBC STAT, lab is aware.  Pt states she forgot and took her Eliquis about 6 PM last night, pt advised not to take any more Eliquis until post procedure.  Dr Tamala Julian is aware procedure is scheduled for 11/12/17 12 noon,  pt took Eliquis last night about 6 PM and pt has been advised not to take any more Eliquis until post procedure.

## 2017-11-11 NOTE — Telephone Encounter (Signed)
Spoke with patient, she will be at our office for lab in about 30 minutes, she will not take any more Eliquis until post procedure.

## 2017-11-11 NOTE — H&P (Signed)
Cath from left radial with last dose of Eliquis 48 hours prior to cath.

## 2017-11-11 NOTE — Telephone Encounter (Addendum)
11/11/17 GFR 45 -reviewed with Dr Velda Shell not recommend pre-procedure hydration,  hold lisinopril/lasix/KCl pre-procedure.  Pt contacted pre-catheterization scheduled at [facility] for: Tuesday November 12, 2017 12 noon Verified arrival time and place: [place] Entrance A at: 10 AM  No solid food after midnight prior to cath, clear liquids until 5 AM day of procedure.  Hold: Furosemide -11/11/17 until post procedure, KCl -11/11/17 until post procedure. Lisinopril 11/11/17 until post procedure.  Eliquis 11/11/17 until post procedure.  AM meds can be  taken pre-cath with sip of water including: ASA 81 mg  Confirmed patient has responsible person to drive home post procedure and observe patient for 24 hours: yes  Pt states symptoms of UTI have resolved.

## 2017-11-11 NOTE — Telephone Encounter (Signed)
LMTCB for pt to discuss pre-procedure lab.

## 2017-11-12 ENCOUNTER — Encounter (HOSPITAL_COMMUNITY)
Admission: RE | Disposition: A | Discharge status: Home / Self Care | Payer: Self-pay | Source: Ambulatory Visit | Attending: Interventional Cardiology

## 2017-11-12 ENCOUNTER — Ambulatory Visit (HOSPITAL_COMMUNITY)
Admission: RE | Admit: 2017-11-12 | Discharge: 2017-11-12 | Disposition: A | Discharge status: Home / Self Care | Payer: Medicare Other | Source: Ambulatory Visit | Attending: Interventional Cardiology | Admitting: Interventional Cardiology

## 2017-11-12 DIAGNOSIS — E669 Obesity, unspecified: Secondary | ICD-10-CM | POA: Insufficient documentation

## 2017-11-12 DIAGNOSIS — D509 Iron deficiency anemia, unspecified: Secondary | ICD-10-CM | POA: Insufficient documentation

## 2017-11-12 DIAGNOSIS — I2510 Atherosclerotic heart disease of native coronary artery without angina pectoris: Secondary | ICD-10-CM | POA: Diagnosis not present

## 2017-11-12 DIAGNOSIS — I110 Hypertensive heart disease with heart failure: Secondary | ICD-10-CM | POA: Insufficient documentation

## 2017-11-12 DIAGNOSIS — Z7982 Long term (current) use of aspirin: Secondary | ICD-10-CM | POA: Diagnosis not present

## 2017-11-12 DIAGNOSIS — Z882 Allergy status to sulfonamides status: Secondary | ICD-10-CM | POA: Insufficient documentation

## 2017-11-12 DIAGNOSIS — I2584 Coronary atherosclerosis due to calcified coronary lesion: Secondary | ICD-10-CM | POA: Diagnosis not present

## 2017-11-12 DIAGNOSIS — I25119 Atherosclerotic heart disease of native coronary artery with unspecified angina pectoris: Secondary | ICD-10-CM | POA: Diagnosis not present

## 2017-11-12 DIAGNOSIS — Z951 Presence of aortocoronary bypass graft: Secondary | ICD-10-CM | POA: Insufficient documentation

## 2017-11-12 DIAGNOSIS — I721 Aneurysm of artery of upper extremity: Secondary | ICD-10-CM | POA: Diagnosis present

## 2017-11-12 DIAGNOSIS — M797 Fibromyalgia: Secondary | ICD-10-CM | POA: Insufficient documentation

## 2017-11-12 DIAGNOSIS — Z91018 Allergy to other foods: Secondary | ICD-10-CM | POA: Insufficient documentation

## 2017-11-12 DIAGNOSIS — R0789 Other chest pain: Secondary | ICD-10-CM | POA: Diagnosis present

## 2017-11-12 DIAGNOSIS — I4891 Unspecified atrial fibrillation: Secondary | ICD-10-CM | POA: Diagnosis present

## 2017-11-12 DIAGNOSIS — Z888 Allergy status to other drugs, medicaments and biological substances status: Secondary | ICD-10-CM | POA: Insufficient documentation

## 2017-11-12 DIAGNOSIS — M1990 Unspecified osteoarthritis, unspecified site: Secondary | ICD-10-CM | POA: Diagnosis not present

## 2017-11-12 DIAGNOSIS — H409 Unspecified glaucoma: Secondary | ICD-10-CM | POA: Insufficient documentation

## 2017-11-12 DIAGNOSIS — G4733 Obstructive sleep apnea (adult) (pediatric): Secondary | ICD-10-CM | POA: Diagnosis present

## 2017-11-12 DIAGNOSIS — K219 Gastro-esophageal reflux disease without esophagitis: Secondary | ICD-10-CM | POA: Insufficient documentation

## 2017-11-12 DIAGNOSIS — E785 Hyperlipidemia, unspecified: Secondary | ICD-10-CM | POA: Diagnosis not present

## 2017-11-12 DIAGNOSIS — Z7951 Long term (current) use of inhaled steroids: Secondary | ICD-10-CM | POA: Insufficient documentation

## 2017-11-12 DIAGNOSIS — F419 Anxiety disorder, unspecified: Secondary | ICD-10-CM | POA: Insufficient documentation

## 2017-11-12 DIAGNOSIS — I10 Essential (primary) hypertension: Secondary | ICD-10-CM | POA: Diagnosis present

## 2017-11-12 DIAGNOSIS — R600 Localized edema: Secondary | ICD-10-CM | POA: Insufficient documentation

## 2017-11-12 DIAGNOSIS — I509 Heart failure, unspecified: Secondary | ICD-10-CM

## 2017-11-12 DIAGNOSIS — Z9889 Other specified postprocedural states: Secondary | ICD-10-CM | POA: Diagnosis not present

## 2017-11-12 DIAGNOSIS — I5032 Chronic diastolic (congestive) heart failure: Secondary | ICD-10-CM | POA: Insufficient documentation

## 2017-11-12 DIAGNOSIS — E039 Hypothyroidism, unspecified: Secondary | ICD-10-CM | POA: Insufficient documentation

## 2017-11-12 DIAGNOSIS — Z7901 Long term (current) use of anticoagulants: Secondary | ICD-10-CM | POA: Diagnosis not present

## 2017-11-12 DIAGNOSIS — Z79899 Other long term (current) drug therapy: Secondary | ICD-10-CM | POA: Insufficient documentation

## 2017-11-12 DIAGNOSIS — Z6841 Body Mass Index (BMI) 40.0 and over, adult: Secondary | ICD-10-CM | POA: Diagnosis not present

## 2017-11-12 DIAGNOSIS — Z91040 Latex allergy status: Secondary | ICD-10-CM | POA: Insufficient documentation

## 2017-11-12 DIAGNOSIS — Z9981 Dependence on supplemental oxygen: Secondary | ICD-10-CM | POA: Diagnosis not present

## 2017-11-12 DIAGNOSIS — I209 Angina pectoris, unspecified: Secondary | ICD-10-CM

## 2017-11-12 HISTORY — PX: LEFT HEART CATH AND CORS/GRAFTS ANGIOGRAPHY: CATH118250

## 2017-11-12 SURGERY — LEFT HEART CATH AND CORS/GRAFTS ANGIOGRAPHY
Anesthesia: LOCAL

## 2017-11-12 MED ORDER — SODIUM CHLORIDE 0.9 % IV SOLN: 250.0000 mL | INTRAVENOUS | Status: DC | PRN

## 2017-11-12 MED ORDER — SODIUM CHLORIDE 0.9 % WEIGHT BASED INFUSION: 1.0000 mL/kg/h | INTRAVENOUS | Status: DC

## 2017-11-12 MED ORDER — HEPARIN (PORCINE) IN NACL 2-0.9 UNITS/ML
INTRAMUSCULAR | Status: AC | PRN
  Administered 2017-11-12 (×2): 500 mL

## 2017-11-12 MED ORDER — ASPIRIN 81 MG PO CHEW: 81.0000 mg | CHEWABLE_TABLET | Freq: Every day | ORAL | Status: DC

## 2017-11-12 MED ORDER — SODIUM CHLORIDE 0.9 % WEIGHT BASED INFUSION
3.0000 mL/kg/h | INTRAVENOUS | Status: AC
  Administered 2017-11-12: 3 mL/kg/h via INTRAVENOUS

## 2017-11-12 MED ORDER — FENTANYL CITRATE (PF) 100 MCG/2ML IJ SOLN
INTRAMUSCULAR | Status: AC
  Filled 2017-11-12: qty 2

## 2017-11-12 MED ORDER — SODIUM CHLORIDE 0.9 % IV SOLN: INTRAVENOUS | Status: AC

## 2017-11-12 MED ORDER — LIDOCAINE HCL (PF) 1 % IJ SOLN
INTRAMUSCULAR | Status: AC
  Filled 2017-11-12: qty 30

## 2017-11-12 MED ORDER — HEPARIN SODIUM (PORCINE) 1000 UNIT/ML IJ SOLN
INTRAMUSCULAR | Status: AC
  Filled 2017-11-12: qty 1

## 2017-11-12 MED ORDER — SODIUM CHLORIDE 0.9% FLUSH: 3.0000 mL | Freq: Two times a day (BID) | INTRAVENOUS | Status: DC

## 2017-11-12 MED ORDER — MIDAZOLAM HCL 2 MG/2ML IJ SOLN
INTRAMUSCULAR | Status: AC
  Filled 2017-11-12: qty 2

## 2017-11-12 MED ORDER — OXYCODONE HCL 5 MG PO TABS: 5.0000 mg | ORAL_TABLET | ORAL | Status: DC | PRN

## 2017-11-12 MED ORDER — LIDOCAINE HCL (PF) 1 % IJ SOLN
INTRAMUSCULAR | Status: DC | PRN
  Administered 2017-11-12: 2 mL

## 2017-11-12 MED ORDER — FENTANYL CITRATE (PF) 100 MCG/2ML IJ SOLN
INTRAMUSCULAR | Status: DC | PRN
  Administered 2017-11-12: 50 ug via INTRAVENOUS

## 2017-11-12 MED ORDER — ASPIRIN 81 MG PO CHEW
CHEWABLE_TABLET | ORAL | Status: AC
  Administered 2017-11-12: 81 mg via ORAL
  Filled 2017-11-12: qty 1

## 2017-11-12 MED ORDER — ASPIRIN 81 MG PO CHEW
81.0000 mg | CHEWABLE_TABLET | ORAL | Status: AC
  Administered 2017-11-12: 81 mg via ORAL

## 2017-11-12 MED ORDER — SODIUM CHLORIDE 0.9% FLUSH: 3.0000 mL | INTRAVENOUS | Status: DC | PRN

## 2017-11-12 MED ORDER — ACETAMINOPHEN 325 MG PO TABS: 650.0000 mg | ORAL_TABLET | ORAL | Status: DC | PRN

## 2017-11-12 MED ORDER — MIDAZOLAM HCL 2 MG/2ML IJ SOLN
INTRAMUSCULAR | Status: DC | PRN
  Administered 2017-11-12: 1 mg via INTRAVENOUS

## 2017-11-12 MED ORDER — LABETALOL HCL 5 MG/ML IV SOLN
INTRAVENOUS | Status: DC | PRN
  Administered 2017-11-12: 20 mg via INTRAVENOUS

## 2017-11-12 MED ORDER — LABETALOL HCL 5 MG/ML IV SOLN
INTRAVENOUS | Status: AC
  Filled 2017-11-12: qty 4

## 2017-11-12 MED ORDER — VERAPAMIL HCL 2.5 MG/ML IV SOLN
INTRAVENOUS | Status: AC
  Filled 2017-11-12: qty 2

## 2017-11-12 MED ORDER — ONDANSETRON HCL 4 MG/2ML IJ SOLN: 4.0000 mg | Freq: Four times a day (QID) | INTRAMUSCULAR | Status: DC | PRN

## 2017-11-12 MED ORDER — HEPARIN SODIUM (PORCINE) 1000 UNIT/ML IJ SOLN
INTRAMUSCULAR | Status: DC | PRN
  Administered 2017-11-12: 5000 [IU] via INTRAVENOUS

## 2017-11-12 MED ORDER — HEPARIN (PORCINE) IN NACL 1000-0.9 UT/500ML-% IV SOLN
INTRAVENOUS | Status: AC
  Filled 2017-11-12: qty 1000

## 2017-11-12 MED ORDER — VERAPAMIL HCL 2.5 MG/ML IV SOLN
INTRAVENOUS | Status: DC | PRN
  Administered 2017-11-12: 10 mL via INTRA_ARTERIAL

## 2017-11-12 MED ORDER — IOHEXOL 350 MG/ML SOLN
INTRAVENOUS | Status: DC | PRN
  Administered 2017-11-12: 110 mL via INTRAVENOUS

## 2017-11-12 SURGICAL SUPPLY — 16 items
CATH INFINITI 5 FR JL3.5 (CATHETERS) ×1 IMPLANT
CATH INFINITI 5FR MULTPACK ANG (CATHETERS) ×1 IMPLANT
COVER PRB 48X5XTLSCP FOLD TPE (BAG) IMPLANT
COVER PROBE 5X48 (BAG) ×2
DEVICE RAD COMP TR BAND LRG (VASCULAR PRODUCTS) ×1 IMPLANT
GLIDESHEATH SLEND A-KIT 6F 22G (SHEATH) ×1 IMPLANT
GUIDEWIRE INQWIRE 1.5J.035X260 (WIRE) IMPLANT
INQWIRE 1.5J .035X260CM (WIRE) ×2
KIT HEART LEFT (KITS) ×2 IMPLANT
KIT HEMO VALVE WATCHDOG (MISCELLANEOUS) ×1 IMPLANT
PACK CARDIAC CATHETERIZATION (CUSTOM PROCEDURE TRAY) ×2 IMPLANT
SHIELD RADPAD SCOOP 12X17 (MISCELLANEOUS) ×1 IMPLANT
TRANSDUCER W/STOPCOCK (MISCELLANEOUS) ×2 IMPLANT
TUBING CIL FLEX 10 FLL-RA (TUBING) ×2 IMPLANT
WIRE ASAHI PROWATER 180CM (WIRE) ×1 IMPLANT
WIRE HI TORQ VERSACORE-J 145CM (WIRE) ×1 IMPLANT

## 2017-11-12 NOTE — Discharge Instructions (Signed)

## 2017-11-12 NOTE — Interval H&P Note (Signed)
History and Physical Interval Note:  11/12/2017 Cath Lab Visit (complete for each Cath Lab visit)  Clinical Evaluation Leading to the Procedure:   ACS: No.  Non-ACS:    Anginal Classification: CCS Roberts  Anti-ischemic medical therapy: Maximal Therapy (2 or more classes of medications)  Non-Invasive Test Results: No non-invasive testing performed  Prior CABG: Previous CABG       12:39 PM  [name]  has presented today for surgery, with the diagnosis of cad with unstable angina  The various methods of treatment have been discussed with the patient and family. After consideration of risks, benefits and other options for treatment, the patient has consented to  Procedure(s): LEFT HEART CATH AND CORS/GRAFTS ANGIOGRAPHY (N/A) as a surgical intervention .  The patient's history has been reviewed, patient examined, no change in status, stable for surgery.  I have reviewed the patient's chart and labs.  Questions were answered to the patient's satisfaction.     [name]

## 2017-11-13 ENCOUNTER — Encounter (HOSPITAL_COMMUNITY): Payer: Self-pay | Admitting: Interventional Cardiology

## 2017-11-13 MED FILL — Heparin Sod (Porcine)-NaCl IV Soln 1000 Unit/500ML-0.9%: INTRAVENOUS | Qty: 1000 | Status: AC

## 2017-11-14 ENCOUNTER — Encounter (HOSPITAL_COMMUNITY): Payer: Self-pay | Admitting: Emergency Medicine

## 2017-11-14 ENCOUNTER — Emergency Department (HOSPITAL_COMMUNITY)
Admission: EM | Admit: 2017-11-14 | Discharge: 2017-11-15 | Disposition: A | Discharge status: Home / Self Care | Payer: Medicare Other | Attending: Emergency Medicine | Admitting: Emergency Medicine

## 2017-11-14 ENCOUNTER — Other Ambulatory Visit: Payer: Self-pay

## 2017-11-14 ENCOUNTER — Emergency Department (HOSPITAL_COMMUNITY): Payer: Medicare Other

## 2017-11-14 DIAGNOSIS — Z79899 Other long term (current) drug therapy: Secondary | ICD-10-CM | POA: Insufficient documentation

## 2017-11-14 DIAGNOSIS — Z7901 Long term (current) use of anticoagulants: Secondary | ICD-10-CM | POA: Insufficient documentation

## 2017-11-14 DIAGNOSIS — R609 Edema, unspecified: Secondary | ICD-10-CM | POA: Insufficient documentation

## 2017-11-14 DIAGNOSIS — I110 Hypertensive heart disease with heart failure: Secondary | ICD-10-CM | POA: Insufficient documentation

## 2017-11-14 DIAGNOSIS — Z951 Presence of aortocoronary bypass graft: Secondary | ICD-10-CM | POA: Insufficient documentation

## 2017-11-14 DIAGNOSIS — M542 Cervicalgia: Secondary | ICD-10-CM | POA: Diagnosis not present

## 2017-11-14 DIAGNOSIS — R0789 Other chest pain: Secondary | ICD-10-CM | POA: Diagnosis present

## 2017-11-14 DIAGNOSIS — I509 Heart failure, unspecified: Secondary | ICD-10-CM | POA: Insufficient documentation

## 2017-11-14 DIAGNOSIS — Z87891 Personal history of nicotine dependence: Secondary | ICD-10-CM | POA: Diagnosis not present

## 2017-11-14 DIAGNOSIS — I2510 Atherosclerotic heart disease of native coronary artery without angina pectoris: Secondary | ICD-10-CM | POA: Diagnosis not present

## 2017-11-14 DIAGNOSIS — M25512 Pain in left shoulder: Secondary | ICD-10-CM | POA: Diagnosis not present

## 2017-11-14 DIAGNOSIS — Z91040 Latex allergy status: Secondary | ICD-10-CM | POA: Diagnosis not present

## 2017-11-14 LAB — I-STAT TROPONIN, ED: TROPONIN I, POC: 0 ng/mL (ref 0.00–0.08)

## 2017-11-14 NOTE — ED Notes (Signed)
Patient transported to X-ray 

## 2017-11-14 NOTE — ED Triage Notes (Signed)
Pt reports Cp on L side of chest radiating to L side of neck and L arm starting this evening. Pt had cardiac cath 2 days ago. Pt reports SHOB on exertion. Pt denies nausea/vomiting

## 2017-11-15 ENCOUNTER — Encounter (HOSPITAL_COMMUNITY): Payer: Self-pay | Admitting: Emergency Medicine

## 2017-11-15 DIAGNOSIS — M542 Cervicalgia: Secondary | ICD-10-CM | POA: Diagnosis not present

## 2017-11-15 LAB — CBC
HEMATOCRIT: 34.8 % — AB (ref 36.0–46.0)
HEMOGLOBIN: 10.4 g/dL — AB (ref 12.0–15.0)
MCH: 26.4 pg (ref 26.0–34.0)
MCHC: 29.9 g/dL — AB (ref 30.0–36.0)
MCV: 88.3 fL (ref 78.0–100.0)
Platelets: 194 10*3/uL (ref 150–400)
RBC: 3.94 MIL/uL (ref 3.87–5.11)
RDW: 19.1 % — ABNORMAL HIGH (ref 11.5–15.5)
WBC: 8.5 10*3/uL (ref 4.0–10.5)

## 2017-11-15 LAB — BASIC METABOLIC PANEL
ANION GAP: 10 (ref 5–15)
BUN: 17 mg/dL (ref 8–23)
CALCIUM: 9.5 mg/dL (ref 8.9–10.3)
CO2: 26 mmol/L (ref 22–32)
Chloride: 102 mmol/L (ref 98–111)
Creatinine, Ser: 1.2 mg/dL — ABNORMAL HIGH (ref 0.44–1.00)
GFR calc Af Amer: 50 mL/min — ABNORMAL LOW (ref >60)
GFR calc non Af Amer: 43 mL/min — ABNORMAL LOW (ref >60)
GLUCOSE: 89 mg/dL (ref 70–99)
POTASSIUM: 3.6 mmol/L (ref 3.5–5.1)
Sodium: 138 mmol/L (ref 135–145)

## 2017-11-15 LAB — I-STAT TROPONIN, ED: Troponin i, poc: 0 ng/mL (ref 0.00–0.08)

## 2017-11-15 MED ORDER — GI COCKTAIL ~~LOC~~
30.0000 mL | Freq: Once | ORAL | Status: AC
  Administered 2017-11-15: 30 mL via ORAL
  Filled 2017-11-15: qty 30

## 2017-11-15 MED ORDER — KETOROLAC TROMETHAMINE 30 MG/ML IJ SOLN
15.0000 mg | Freq: Once | INTRAMUSCULAR | Status: AC
  Administered 2017-11-15: 15 mg via INTRAVENOUS
  Filled 2017-11-15: qty 1

## 2017-11-15 NOTE — ED Provider Notes (Signed)
[facility] DEPART[facility] Provider Note   CSN: [id number] Arrival date & time: 11/14/17  2316     History   Chief Complaint Chief Complaint  Patient presents with  . Chest Pain    HPI [name] is a 77 y.o. female.  The history is provided by the patient.  Chest Pain   This is a recurrent problem. The current episode started 6 to 12 hours ago. The problem occurs constantly. The problem has not changed since onset.The pain is associated with rest. The pain is present in the substernal region. The pain is moderate. The quality of the pain is described as burning. The pain radiates to the left neck, left shoulder and left arm. Exacerbated by: recent catheterization. Pertinent negatives include no diaphoresis, no lower extremity edema, no nausea, no near-syncope, no palpitations and no shortness of breath. She has tried nothing for the symptoms. The treatment provided no relief. Risk factors include being elderly and obesity.  Her past medical history is significant for CAD.  Pertinent negatives for family medical history include: no Marfan's syndrome.  Procedure history is positive for cardiac catheterization.    Past Medical History:  Diagnosis Date  . Anxiety   . Coronary artery disease    a. s/p MI 2009 - PCI/DES distal  RCA w/ 2.75 x 18 Xience DES;  b. 05/2010 Myoview: Apical thinning, EF 73%;  c. 06/2010 Cath: moderate nonobs dzs, EF 65%;  d.  Lexiscan Myoview (07/2013):  No scar or ischemia, EF 67%; Normal Study  . Environmental allergies   . Essential iris atrophy    Right side  . Fatigue   . Fibromyalgia   . GERD (gastroesophageal reflux disease)   . Glaucoma   . Hyperlipidemia   . Hypertension   . Menopause   . Obesity (BMI 30-39.9)   . OSA (obstructive sleep apnea)    does not wear CPAP  . Osteoarthritis   . Oxygen dependent    3L    Patient Active Problem List   Diagnosis Date Noted  . CHF (congestive heart failure) ([place])  09/28/2017  . Right arm pain 09/26/2016  . Depression 04/29/2016  . Chronic fatigue syndrome 04/29/2016  . SOB (shortness of breath) 04/27/2016  . Aneurysm of right radial artery ([place]) 11/01/2015  . Anxiety state 10/03/2015  . CAD (coronary artery disease) 09/23/2015  . Angina pectoris ([place])   . Atrial fibrillation ([place]) 12/19/2014  . Multiple thyroid nodules 01/12/2014  . OSA (obstructive sleep apnea)   . Obesity (BMI 30-39.9)   . Sleep apnea 06/22/2013  . Fibromyalgia   . Coronary artery disease   . Hypertension   . Hyperlipidemia   . Fatigue   . History of myocardial infarction     Past Surgical History:  Procedure Laterality Date  . CARDIAC CATHETERIZATION  06/29/2010   Mild to moderate coronary artery irregularities.  Her  proximal left anterior descending artery is moderately narrowed.  She  also has an eccentric stenosis in the proximal LAD.  These do not appear  to obstruct flow at present, but they are fairly small vessels.  They  appeared to be between 1.5 and 2 mm in diamete  . CARDIAC CATHETERIZATION  08/02/2009   Patent stent  . CARDIAC CATHETERIZATION  05/12/2008   Stent to the distal RCA  . CARDIAC CATHETERIZATION N/A 09/20/2015   Procedure: Left Heart Cath and Coronary Angiography;  Surgeon: Jettie Booze, MD;  Location: [facility];  Service: Cardiovascular;  Laterality: N/A;  . CARDIAC CATHETERIZATION  09/20/2015   Procedure: Intravascular Ultrasound/IVUS;  Surgeon: Jettie Booze, MD;  Location: [facility];  Service: Cardiovascular;;  . CARDIAC CATHETERIZATION  09/20/2015   Procedure: Intravascular Pressure Wire/FFR Study;  Surgeon: Jettie Booze, MD;  Location: [facility];  Service: Cardiovascular;;  . CLIPPING OF ATRIAL APPENDAGE N/A 09/23/2015   Procedure:  CLIPPING OF ATRIAL APPENDAGE;  Surgeon: Grace Isaac, MD;  Location: [facility];  Service: Open Heart Surgery;  Laterality: N/A;  . CORONARY ARTERY BYPASS GRAFT N/A 09/23/2015     Procedure: CORONARY ARTERY BYPASS GRAFTING (CABG) TIMES 1 USING LEFT INTERNAL MAMMARY TO THE LAD;  Surgeon: Grace Isaac, MD;  Location: [facility];  Service: Open Heart Surgery;  Laterality: N/A;  . LEFT HEART CATH AND CORS/GRAFTS ANGIOGRAPHY N/A 11/12/2017   Procedure: LEFT HEART CATH AND CORS/GRAFTS ANGIOGRAPHY;  Surgeon: Belva Crome, MD;  Location: [facility];  Service: Cardiovascular;  Laterality: N/A;  . MAZE N/A 09/23/2015   Procedure: MAZE;  Surgeon: Grace Isaac, MD;  Location: [facility];  Service: Open Heart Surgery;  Laterality: N/A;  . RESECTION OF ARTERIOVENOUS FISTULA ANEURYSM Right 10/21/2015   Procedure: SUTURE REPAIR OF RADIAL ARTERY AND EVACUATION OF HEMATOMA;  Surgeon: Mal Misty, MD;  Location: [facility];  Service: Vascular;  Laterality: Right;  . TEE WITHOUT CARDIOVERSION N/A 09/23/2015   Procedure: TRANSESOPHAGEAL ECHOCARDIOGRAM (TEE);  Surgeon: Grace Isaac, MD;  Location: [facility];  Service: Open Heart Surgery;  Laterality: N/A;     OB History   None      Home Medications    Prior to Admission medications   Medication Sig Start Date End Date Taking? Authorizing Provider  acetaminophen (TYLENOL) 500 MG tablet Take 1,000 mg by mouth every 6 (six) hours as needed (for pain).    Yes [provider]  ELIQUIS 5 MG TABS tablet TAKE 1 TABLET(5 MG) BY MOUTH TWICE DAILY 09/30/17  Yes Nahser, Wonda Cheng, MD  furosemide (LASIX) 40 MG tablet Take 1 tablet (40 mg total) by mouth 2 (two) times daily. Patient taking differently: Take 40 mg by mouth daily.  09/30/17  Yes Thurnell Lose, MD  levothyroxine (SYNTHROID, LEVOTHROID) 75 MCG tablet Take 1 tablet (75 mcg total) by mouth daily before breakfast. 06/01/17  Yes Tereasa Coop, PA-C  traMADol (ULTRAM) 50 MG tablet Take one tablet by mouth every 4 hours as needed for moderate pain; Take two tablets by mouth every 4 hours as needed for severe pain Patient taking differently: Take 50 mg by mouth every 4 (four)  hours as needed (for pain).  10/06/15  Yes Reed, Tiffany L, DO  amLODipine (NORVASC) 10 MG tablet Take 1 tablet (10 mg total) by mouth daily. Patient not taking: Reported on 11/15/2017 10/07/17   Nahser, Wonda Cheng, MD  ferrous sulfate 325 (65 FE) MG tablet Take 1 tablet (325 mg total) by mouth 2 (two) times daily with a meal. 09/30/17   Thurnell Lose, MD  lisinopril (PRINIVIL,ZESTRIL) 20 MG tablet Take 20 mg by mouth daily.    [provider]  metoprolol tartrate (LOPRESSOR) 25 MG tablet Take 1 tablet (25 mg total) by mouth 2 (two) times daily. 02/11/17   Nahser, Wonda Cheng, MD  nitroGLYCERIN (NITROSTAT) 0.4 MG SL tablet Place 1 tablet (0.4 mg total) under the tongue every 5 (five) minutes as needed for chest pain. 10/31/17   Nahser, Arnette Norris  J, MD  pantoprazole (PROTONIX) 40 MG tablet Take 40 mg by mouth 2 (two) times daily.  01/07/14   [provider]  potassium chloride 20 MEQ TBCR Take 20 mEq by mouth daily. 09/30/17   Thurnell Lose, MD  pramipexole (MIRAPEX) 1 MG tablet Take 1 mg by mouth 2 (two) times daily.     [provider]  rosuvastatin (CRESTOR) 5 MG tablet Take 1 tablet (5 mg total) daily by mouth. 03/27/17 11/15/17  Nahser, Wonda Cheng, MD  trolamine salicylate (ASPERCREME) 10 % cream Apply 1 application topically as needed for muscle pain.    [provider]  venlafaxine (EFFEXOR) 75 MG tablet Take 75 mg by mouth 2 (two) times daily.     [provider]    Family History Family History  Problem Relation Age of Onset  . Heart attack Mother   . Cancer Father   . Coronary artery disease Brother        had CABG  . Coronary artery disease Brother   . Coronary artery disease Brother   . Coronary artery disease Brother   . Coronary artery disease Brother   . Heart attack Brother   . Breast cancer Sister     Social History Social History   Tobacco Use  . Smoking status: Former Smoker    Types: Cigarettes    Last attempt to quit: 09/13/1960      Years since quitting: 57.2  . Smokeless tobacco: Never Used  Substance Use Topics  . Alcohol use: No  . Drug use: No     Allergies   Other; Sulfamethoxazole-trimethoprim; Cortisone; Latex; and Tape   Review of Systems Review of Systems  Constitutional: Negative for diaphoresis.  Eyes: Negative for photophobia.  Respiratory: Negative for shortness of breath.   Cardiovascular: Positive for chest pain. Negative for palpitations and near-syncope.  Gastrointestinal: Negative for nausea.  Genitourinary: Negative for dysuria and flank pain.  Musculoskeletal: Negative for joint swelling.  All other systems reviewed and are negative.    Physical Exam Updated Vital Signs BP (!) 148/58   Pulse 65   Temp 98.2 F (36.8 C) (Oral)   Resp 20   Ht 5\' 5"  (1.651 m)   Wt 112.9 kg (249 lb)   SpO2 95%   BMI 41.44 kg/m   Physical Exam  Constitutional: She is oriented to person, place, and time. She appears well-developed and well-nourished. No distress.  HENT:  Head: Normocephalic and atraumatic.  Mouth/Throat: No oropharyngeal exudate.  Eyes: Pupils are equal, round, and reactive to light. Conjunctivae are normal.  Neck: Normal range of motion. Neck supple.  Cardiovascular: Normal rate, regular rhythm, normal heart sounds and intact distal pulses.  Pulmonary/Chest: Effort normal and breath sounds normal. No stridor. She has no wheezes. She has no rales.  Abdominal: Soft. Bowel sounds are normal. She exhibits no mass. There is no tenderness. There is no rebound and no guarding.  Musculoskeletal: She exhibits no tenderness.  Pedal edema  Neurological: She is alert and oriented to person, place, and time. She displays normal reflexes.  Skin: Skin is warm and dry. Capillary refill takes less than 2 seconds.  Psychiatric: She has a normal mood and affect.     ED Treatments / Results  Labs (all labs ordered are listed, but only abnormal results are displayed) Labs Reviewed  BASIC  METABOLIC PANEL - Abnormal; Notable for the following components:      Result Value   Creatinine, Ser 1.20 (*)  GFR calc non Af Amer 43 (*)    GFR calc Af Amer 50 (*)    All other components within normal limits  CBC - Abnormal; Notable for the following components:   Hemoglobin 10.4 (*)    HCT 34.8 (*)    MCHC 29.9 (*)    RDW 19.1 (*)    All other components within normal limits  I-STAT TROPONIN, ED    EKG EKG Interpretation  Date/Time:  Thursday November 14 2017 23:23:16 EDT Ventricular Rate:  68 PR Interval:  166 QRS Duration: 78 QT Interval:  412 QTC Calculation: 438 R Axis:   66 Text Interpretation:  Normal sinus rhythm Confirmed by Randal Buba, Valora Norell ([id number]) on 11/15/2017 1:32:24 AM   Radiology Dg Chest 2 View  Result Date: 11/14/2017 CLINICAL DATA:  77 y/o F; left-sided chest pain, left neck and shoulder pain radiating to the left arm. Cardiac catheterization 11/12/2017. EXAM: CHEST - 2 VIEW COMPARISON:  10/08/2017 chest radiograph FINDINGS: Mild cardiomegaly. Aortic atherosclerosis with calcification. Left atrial appendage clip and post median sternotomy changes are stable. Pulmonary venous hypertension. No focal consolidation. No acute osseous abnormality is evident. IMPRESSION: Pulmonary venous hypertension.  Stable mild cardiomegaly. Electronically Signed   By: Kristine Garbe M.D.   On: 11/14/2017 23:58    Procedures Procedures (including critical care time)  Medications Ordered in ED Medications  ketorolac (TORADOL) 30 MG/ML injection 15 mg (has no administration in time range)  gi cocktail (Maalox,Lidocaine,Donnatal) (has no administration in time range)    305 am Seen by cardiology Dr. Tonita Cong who feels pain is MSK. If second troponin is negative is safe for d/c   Final Clinical Impressions(s) / ED Diagnoses    Return for leg/arm or calf swelling or pain, numbness, changes in vision or speech, fevers >100.4 unrelieved by medication, shortness of  breath, intractable vomiting, or diarrhea, abdominal pain, Inability to tolerate liquids or food, cough, altered mental status or any concerns. No signs of systemic illness or infection. The patient is nontoxic-appearing on exam and vital signs are within normal limits. Will refer to urology for microscopy hematuria as patient is asymptomatic.  I have reviewed the triage vital signs and the nursing notes. Pertinent labs &imaging results that were available during my care of the patient were reviewed by me and considered in my medical decision making (see chart for details).  After history, exam, and medical workup I feel the patient has been appropriately medically screened and is safe for discharge home. Pertinent diagnoses were discussed with the patient. Patient was given return precautions.   Enrika Aguado, MD 11/15/17 [phone]

## 2017-11-15 NOTE — Consult Note (Signed)
Consul  Primary Cardiologist:nasher PCP: Alroy Dust, L.Marlou Sa, MD  Chief Complaint: chest pain  Consult for chest pain by request of Dr. Randal Buba of the ED  HPI:  77 YOF with history of diastolic heart failure, afib, hld, osa, htn, CABG 2017 recent cath medically managed resenting with chest and left shoulder/arm pain.    She was recently evaluated at Dr. Julious Payer office complaining of chest pain.  Subsequently had LHC via left radial by Dr. Tamala Julian.  See results below, in brief she had normal LV systolic function with elevated LVEDP.  She had stable appearance of native coronary disease including proximal LAD 85% stenosis and some mid to distal LAD moderate stenosis as well as mid RCA moderate stenosis that was per report unchanged from heart catheterization in 2017.  After discussion with the patient it was decided to medically manage this which she was very comfortable with.  She returned home and subsequently recalls reaching upwards to help pull herself up a few days prior.  She wonders if this possibly exacerbated her chronic left shoulder discomfort.  She had written it off as musculoskeletal until she later realized the heart catheterization was performed via left radial, and she began to get worried that this could be a sequela of this.  Therefore, she presented to the emergency room.  She describes this left shoulder pain as a chronic musculoskeletal pain that she has had for some time which is unchanged.  Located just above her left clavicle is the location of this new discomfort.  It does worsen with movement and with palpation to some degree.  It is very fleeting coming and going, also possibly worsened with head movement.  She walked from the parking lot to the emergency room and her discomfort did not get worse with exertion.  She is hypertensive which she does blame on this walk.  No nausea vomiting, shortness of breath above baseline.  She has some edema and she admits to having some  potato chips today.   Prior Cardiac Studies: LHC 11/12/17 Conclusion    Patent LIMA to the distal LAD  Short left main  Widely patent left circumflex coronary artery  LAD is not grafted by the LIMA.  Proximal LAD contains 85% stenosis.  Ostial LAD not well-visualized.  Mid to distal LAD is widely patent there is 50% narrowing in the ostium of the first large diagonal.  Both antegrade and retrograde of the mid and distal LAD via native LAD and LIMA.  Native right coronary is dominant.  There is a patent stent in the distal vessel.  There is a region of relatively focal mid RCA disease in the 60 to 70% range that is unchanged when compared to cardiac catheterization prior to bypass grafting in 2017.  FFR at that time was 0.  79.  RCA was not bypassed.  Normal left ventricular systolic function with ejection fraction 55%.  Elevated LVEDP consistent with chronic diastolic heart failure.  RECOMMENDATIONS:   Correlation of symptoms with angiographic findings.  RCA could be treated percutaneously although it does not appear to be angiographically different compared to 2 years ago prior to surgery.     Past Medical History:  Diagnosis Date  . Anxiety   . Coronary artery disease    a. s/p MI 2009 - PCI/DES distal  RCA w/ 2.75 x 18 Xience DES;  b. 05/2010 Myoview: Apical thinning, EF 73%;  c. 06/2010 Cath: moderate nonobs dzs, EF 65%;  d.  Lexiscan Myoview (07/2013):  No  scar or ischemia, EF 67%; Normal Study  . Environmental allergies   . Essential iris atrophy    Right side  . Fatigue   . Fibromyalgia   . GERD (gastroesophageal reflux disease)   . Glaucoma   . Hyperlipidemia   . Hypertension   . Menopause   . Obesity (BMI 30-39.9)   . OSA (obstructive sleep apnea)    does not wear CPAP  . Osteoarthritis   . Oxygen dependent    3L    Past Surgical History:  Procedure Laterality Date  . CARDIAC CATHETERIZATION  06/29/2010   Mild to moderate coronary artery irregularities.  Her   proximal left anterior descending artery is moderately narrowed.  She  also has an eccentric stenosis in the proximal LAD.  These do not appear  to obstruct flow at present, but they are fairly small vessels.  They  appeared to be between 1.5 and 2 mm in diamete  . CARDIAC CATHETERIZATION  08/02/2009   Patent stent  . CARDIAC CATHETERIZATION  05/12/2008   Stent to the distal RCA  . CARDIAC CATHETERIZATION N/A 09/20/2015   Procedure: Left Heart Cath and Coronary Angiography;  Surgeon: Jettie Booze, MD;  Location: [facility];  Service: Cardiovascular;  Laterality: N/A;  . CARDIAC CATHETERIZATION  09/20/2015   Procedure: Intravascular Ultrasound/IVUS;  Surgeon: Jettie Booze, MD;  Location: [facility];  Service: Cardiovascular;;  . CARDIAC CATHETERIZATION  09/20/2015   Procedure: Intravascular Pressure Wire/FFR Study;  Surgeon: Jettie Booze, MD;  Location: [facility];  Service: Cardiovascular;;  . CLIPPING OF ATRIAL APPENDAGE N/A 09/23/2015   Procedure:  CLIPPING OF ATRIAL APPENDAGE;  Surgeon: Grace Isaac, MD;  Location: [facility];  Service: Open Heart Surgery;  Laterality: N/A;  . CORONARY ARTERY BYPASS GRAFT N/A 09/23/2015   Procedure: CORONARY ARTERY BYPASS GRAFTING (CABG) TIMES 1 USING LEFT INTERNAL MAMMARY TO THE LAD;  Surgeon: Grace Isaac, MD;  Location: [facility];  Service: Open Heart Surgery;  Laterality: N/A;  . LEFT HEART CATH AND CORS/GRAFTS ANGIOGRAPHY N/A 11/12/2017   Procedure: LEFT HEART CATH AND CORS/GRAFTS ANGIOGRAPHY;  Surgeon: Belva Crome, MD;  Location: [facility];  Service: Cardiovascular;  Laterality: N/A;  . MAZE N/A 09/23/2015   Procedure: MAZE;  Surgeon: Grace Isaac, MD;  Location: [facility];  Service: Open Heart Surgery;  Laterality: N/A;  . RESECTION OF ARTERIOVENOUS FISTULA ANEURYSM Right 10/21/2015   Procedure: SUTURE REPAIR OF RADIAL ARTERY AND EVACUATION OF HEMATOMA;  Surgeon: Mal Misty, MD;  Location: [facility];  Service:  Vascular;  Laterality: Right;  . TEE WITHOUT CARDIOVERSION N/A 09/23/2015   Procedure: TRANSESOPHAGEAL ECHOCARDIOGRAM (TEE);  Surgeon: Grace Isaac, MD;  Location: [facility];  Service: Open Heart Surgery;  Laterality: N/A;    Family History  Problem Relation Age of Onset  . Heart attack Mother   . Cancer Father   . Coronary artery disease Brother        had CABG  . Coronary artery disease Brother   . Coronary artery disease Brother   . Coronary artery disease Brother   . Coronary artery disease Brother   . Heart attack Brother   . Breast cancer Sister    Social History:  reports that she quit smoking about 57 years ago. Her smoking use included cigarettes. She has never used smokeless tobacco. She reports that she does not drink alcohol or use drugs.  Allergies:  Allergies  Allergen Reactions  . Other Nausea Only and Other (See Comments)    NO MEAT!!  . Sulfamethoxazole-Trimethoprim Nausea Only  . Cortisone Nausea Only and Other (See Comments)    Pt gets very hot and flushed  . Latex Rash and Other (See Comments)    Redness and irritation  . Tape Rash and Other (See Comments)    Redness and irritation    No current facility-administered medications on file prior to encounter.    Current Outpatient Medications on File Prior to Encounter  Medication Sig Dispense Refill  . acetaminophen (TYLENOL) 500 MG tablet Take 1,000 mg by mouth every 6 (six) hours as needed (for pain).     Marland Kitchen ELIQUIS 5 MG TABS tablet TAKE 1 TABLET(5 MG) BY MOUTH TWICE DAILY 180 tablet 1  . furosemide (LASIX) 40 MG tablet Take 1 tablet (40 mg total) by mouth 2 (two) times daily. (Patient taking differently: Take 40 mg by mouth daily. ) 60 tablet 0  . levothyroxine (SYNTHROID, LEVOTHROID) 75 MCG tablet Take 1 tablet (75 mcg total) by mouth daily before breakfast. 90 tablet 0  . traMADol (ULTRAM) 50 MG tablet Take one tablet by mouth every 4 hours as needed for moderate pain; Take two tablets by mouth every 4  hours as needed for severe pain (Patient taking differently: Take 50 mg by mouth every 4 (four) hours as needed (for pain). ) 360 tablet 0  . amLODipine (NORVASC) 10 MG tablet Take 1 tablet (10 mg total) by mouth daily. (Patient not taking: Reported on 11/15/2017) 90 tablet 1  . ferrous sulfate 325 (65 FE) MG tablet Take 1 tablet (325 mg total) by mouth 2 (two) times daily with a meal. 60 tablet 0  . lisinopril (PRINIVIL,ZESTRIL) 20 MG tablet Take 20 mg by mouth daily.    . metoprolol tartrate (LOPRESSOR) 25 MG tablet Take 1 tablet (25 mg total) by mouth 2 (two) times daily. 60 tablet 10  . nitroGLYCERIN (NITROSTAT) 0.4 MG SL tablet Place 1 tablet (0.4 mg total) under the tongue every 5 (five) minutes as needed for chest pain. 25 tablet 6  . pantoprazole (PROTONIX) 40 MG tablet Take 40 mg by mouth 2 (two) times daily.     . potassium chloride 20 MEQ TBCR Take 20 mEq by mouth daily. 30 tablet 0  . pramipexole (MIRAPEX) 1 MG tablet Take 1 mg by mouth 2 (two) times daily.     . rosuvastatin (CRESTOR) 5 MG tablet Take 1 tablet (5 mg total) daily by mouth. 90 tablet 3  . trolamine salicylate (ASPERCREME) 10 % cream Apply 1 application topically as needed for muscle pain.    Marland Kitchen venlafaxine (EFFEXOR) 75 MG tablet Take 75 mg by mouth 2 (two) times daily.       Results for orders placed or performed during the hospital encounter of 11/14/17 (from the past 48 hour(s))  Basic metabolic panel     Status: Abnormal   Collection Time: 11/14/17 11:33 PM  Result Value Ref Range   Sodium 138 135 - 145 mmol/L   Potassium 3.6 3.5 - 5.1 mmol/L   Chloride 102 98 - 111 mmol/L    Comment: Please note change in reference range.   CO2 26 22 - 32 mmol/L   Glucose, Bld 89 70 - 99 mg/dL    Comment: Please note change in reference range.   BUN 17 8 - 23 mg/dL    Comment: Please note change in reference range.   Creatinine, Ser 1.20 (  H) 0.44 - 1.00 mg/dL   Calcium 9.5 8.9 - 10.3 mg/dL   GFR calc non Af Amer 43 (L) >60  mL/min   GFR calc Af Amer 50 (L) >60 mL/min    Comment: (NOTE) The eGFR has been calculated using the CKD EPI equation. This calculation has not been validated in all clinical situations. eGFR's persistently <60 mL/min signify possible Chronic Kidney Disease.    Anion gap 10 5 - 15    Comment: Performed at [facility] [address], [place], [place] [address]  CBC     Status: Abnormal   Collection Time: 11/14/17 11:33 PM  Result Value Ref Range   WBC 8.5 4.0 - 10.5 K/uL   RBC 3.94 3.87 - 5.11 MIL/uL   Hemoglobin 10.4 (L) 12.0 - 15.0 g/dL   HCT 34.8 (L) 36.0 - 46.0 %   MCV 88.3 78.0 - 100.0 fL   MCH 26.4 26.0 - 34.0 pg   MCHC 29.9 (L) 30.0 - 36.0 g/dL   RDW 19.1 (H) 11.5 - 15.5 %   Platelets 194 150 - 400 K/uL    Comment: Performed at [facility] [address], [place], [place] [address]  I-stat troponin, ED     Status: None   Collection Time: 11/14/17 11:46 PM  Result Value Ref Range   Troponin i, poc 0.00 0.00 - 0.08 ng/mL   Comment 3            Comment: Due to the release kinetics of cTnI, a negative result within the first hours of the onset of symptoms does not rule out myocardial infarction with certainty. If myocardial infarction is still suspected, repeat the test at appropriate intervals.    Dg Chest 2 View  Result Date: 11/14/2017 CLINICAL DATA:  77 y/o F; left-sided chest pain, left neck and shoulder pain radiating to the left arm. Cardiac catheterization 11/12/2017. EXAM: CHEST - 2 VIEW COMPARISON:  10/08/2017 chest radiograph FINDINGS: Mild cardiomegaly. Aortic atherosclerosis with calcification. Left atrial appendage clip and post median sternotomy changes are stable. Pulmonary venous hypertension. No focal consolidation. No acute osseous abnormality is evident. IMPRESSION: Pulmonary venous hypertension.  Stable mild cardiomegaly. Electronically Signed   By: Kristine Garbe M.D.   On: 11/14/2017 23:58    ECG/Tele: NSR Nl ECG  ROS: As  above. Otherwise, review of systems is negative unless per above HPI  Vitals:   11/15/17 0037 11/15/17 0145 11/15/17 0200 11/15/17 0215  BP: (!) 146/57 (!) 151/56 (!) 148/58 (!) 149/60  Pulse: 72 63 65 66  Resp: '18 16 20 17  '$ Temp: 98.2 F (36.8 C)     TempSrc: Oral     SpO2: 96% 94% 95% 95%  Weight:      Height:       Wt Readings from Last 10 Encounters:  11/14/17 112.9 kg (249 lb)  11/12/17 112.9 kg (249 lb)  10/31/17 112.5 kg (248 lb)  09/28/17 114.3 kg (251 lb 14.4 oz)  08/27/17 113.4 kg (250 lb)  08/22/17 115.3 kg (254 lb 1.9 oz)  05/31/17 113.1 kg (249 lb 6.4 oz)  05/31/17 112.5 kg (248 lb 1.9 oz)  03/18/17 109.9 kg (242 lb 3.2 oz)  02/16/17 110.2 kg (243 lb)    PE:  General: No acute distress HEENT: Atraumatic, EOMI, mucous membranes moist. No JVD at 45 degrees. No HJR. CV: RRR 2/6 SEM.  Respiratory: Clear, no crackles. Normal work of breathing ABD: Obese, Non-distended and non-tender. No palpable organomegaly.  Extremities: 2+ radial pulses bilaterally. 1-2+ edema.  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  Nonexertional but is worse with palpation.  EKG is benign likely.  First set of troponin is negative.  Recent heart catheterization with stable native CAD with patent grafts.  There is significant proximal LAD and moderate mid RCA disease stable appearing per report since 2017.  Her main concern was that there was some sequela from left radial access which I see no evidence of.  Arm is soft pulses are strong no masses or swelling anywhere.  We spoke at length about the options including observation admission versus returning home if a second troponin value was normal.  Observation admission was offered, she would prefer to return home should the second troponin returned normal which I think is reasonable.  Will forward  to the day team to arrange outpatient follow up with Dr. Cathie Olden.    Lolita Cram Laquinda Moller  MD 11/15/2017, 2:40 AM

## 2017-12-14 ENCOUNTER — Other Ambulatory Visit: Payer: Self-pay | Admitting: Physician Assistant

## 2017-12-14 DIAGNOSIS — E039 Hypothyroidism, unspecified: Secondary | ICD-10-CM

## 2017-12-16 ENCOUNTER — Other Ambulatory Visit: Payer: Self-pay | Admitting: Physician Assistant

## 2017-12-16 DIAGNOSIS — E039 Hypothyroidism, unspecified: Secondary | ICD-10-CM

## 2017-12-18 ENCOUNTER — Encounter

## 2017-12-18 ENCOUNTER — Encounter: Payer: Self-pay | Admitting: Physician Assistant

## 2017-12-18 ENCOUNTER — Ambulatory Visit (INDEPENDENT_AMBULATORY_CARE_PROVIDER_SITE_OTHER): Payer: Medicare Other | Admitting: Physician Assistant

## 2017-12-18 VITALS — BP 116/82 | HR 63 | Ht 65.5 in | Wt 256.8 lb

## 2017-12-18 DIAGNOSIS — I10 Essential (primary) hypertension: Secondary | ICD-10-CM

## 2017-12-18 DIAGNOSIS — I480 Paroxysmal atrial fibrillation: Secondary | ICD-10-CM | POA: Diagnosis not present

## 2017-12-18 DIAGNOSIS — D509 Iron deficiency anemia, unspecified: Secondary | ICD-10-CM | POA: Diagnosis not present

## 2017-12-18 DIAGNOSIS — I2510 Atherosclerotic heart disease of native coronary artery without angina pectoris: Secondary | ICD-10-CM

## 2017-12-18 DIAGNOSIS — I5032 Chronic diastolic (congestive) heart failure: Secondary | ICD-10-CM | POA: Diagnosis not present

## 2017-12-18 DIAGNOSIS — E782 Mixed hyperlipidemia: Secondary | ICD-10-CM

## 2017-12-18 MED ORDER — POTASSIUM CHLORIDE ER 20 MEQ PO TBCR: 20.0000 meq | EXTENDED_RELEASE_TABLET | Freq: Every day | ORAL | 2 refills | Status: AC

## 2017-12-18 NOTE — Patient Instructions (Signed)
Medication Instructions:  No changes  Labwork: None   Testing/Procedures: None   Follow-Up: As needed with Mertie Moores, MD  Good luck on your move to [place].  Any Other Special Instructions Will Be Listed Below (If Applicable).  If you need a refill on your cardiac medications before your next appointment, please call your pharmacy.

## 2017-12-18 NOTE — Progress Notes (Signed)
Cardiology Office Note:    Date:  12/18/2017   ID:  [name], DOB [birth year], MRN [id number]  PCP:  Aurea Graff.Marlou Sa, MD  Cardiologist:  Mertie Moores, MD   Referring MD: Aurea Graff.Marlou Sa, MD   Chief Complaint  Patient presents with  . Hospitalization Follow-up    s/p cardiac cath    History of Present Illness:    [name] is a 77 y.o. female with coronary artery disease status post prior PCI with drug-eluting stent to the RCA in 2009 subsequent CABG with Maze procedure in 2017, atrial fibrillation on chronic anticoagulation with Apixaban, diastolic heart failure, hypertension, hyperlipidemia, sleep apnea, mild aortic stenosis with moderate aortic insufficiency.  She was admitted in May 2019 with decompensated heart failure in the setting of worsening iron deficiency anemia.  She was recently evaluated by Dr. Acie Fredrickson in June 2019 for chest pain that was felt to be consistent with unstable angina.  Cardiac catheterization was recommended.  This demonstrated a patent LIMA-LAD and a patent stent in the distal RCA.  There is focal disease in the mid RCA 60-70%.  The RCA disease could be treated percutaneously.  However, this did not appear angiographically different from cardiac catheterization prior to bypass surgery.  Medical therapy was continued.  She was seen back in the emergency room 11/15/2017 with recurrent chest pain and seen by the Cardiology fellow.  No further workup was recommended.    [name] returns for follow up.  She is here alone.  She denies recurrent chest pain.  She has chronic shortness of breath without significant change. She sleeps on 2 pillows.  She denies paroxysmal nocturnal dyspnea, syncope.  She has chronic leg swelling which worsens throughout the day.  She has not had any bleeding issues.  Her weights at home have gone down.  She did see Dr. Michail Sermon with GI who recommends proceeding with an EGD/colonoscopy to workup her anemia.  However, she is moving to the  [place] area in [place] to live with her son.  She plans to have this worked up there.    Prior CV studies:   The following studies were reviewed today:  Cardiac catheterization 11/12/2017 LAD ostial 85, mid 40 LCx irregularities RCA mid 68, RP AVB stent patent LIMA-LAD patent EF 55-65 LVEDP 30 Diagnostic Diagram    Echo 04/27/2016 Mild concentric LVH, EF 65-70, normal wall motion, grade 2 diastolic dysfunction, mild aortic stenosis (mean 10, peak 18), moderate aortic insufficiency  TEE 09/23/2015 EF 50-55, trivial AI  Echo 09/21/2015 Mild concentric LVH, EF 55-60, normal wall motion, mild aortic stenosis (mean 12), mild LAE  Cardiac catheterization 09/20/2015 LAD ostial-proximal 50, mid 60 (FFR 0.77) RCA mid 50 (FFR 0.79)  Echo 12/29/2014 Mild concentric LVH, EF 65-70, normal wall motion, mild to moderate aortic stenosis (mean 18), mild LAE  Nuclear stress test 07/27/2013 EF 67, no scar or ischemia  Past Medical History:  Diagnosis Date  . Anxiety   . Coronary artery disease    a. s/p MI 2009 - PCI/DES distal  RCA w/ 2.75 x 18 Xience DES;  b. 05/2010 Myoview: Apical thinning, EF 73%;  c. 06/2010 Cath: moderate nonobs dzs, EF 65%;  d.  Lexiscan Myoview (07/2013):  No scar or ischemia, EF 67%; Normal Study  . Environmental allergies   . Essential iris atrophy    Right side  . Fatigue   . Fibromyalgia   . GERD (gastroesophageal reflux disease)   . Glaucoma   . Hyperlipidemia   .  Hypertension   . Menopause   . Obesity (BMI 30-39.9)   . OSA (obstructive sleep apnea)    does not wear CPAP  . Osteoarthritis   . 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   Current Medications: Current Meds  Medication Sig  . acetaminophen (TYLENOL) 500 MG tablet Take 1,000 mg by mouth every 6 (six) hours as needed (for pain).   Marland Kitchen ELIQUIS 5 MG TABS tablet TAKE 1 TABLET(5 MG) BY MOUTH TWICE DAILY  . ferrous sulfate 325 (65 FE) MG tablet Take 1 tablet (325 mg total) by mouth 2 (two) times daily with a meal.  . furosemide (LASIX) 40 MG tablet Take 1 tablet (40 mg total) by mouth 2 (two) times daily. (Patient taking differently: Take 40 mg by mouth daily. )  . levothyroxine (SYNTHROID, LEVOTHROID) 75 MCG tablet TAKE 1 TABLET(75 MCG) BY MOUTH DAILY BEFORE BREAKFAST  . lisinopril (PRINIVIL,ZESTRIL) 20 MG tablet Take 20 mg by mouth daily.  . metoprolol tartrate (LOPRESSOR) 25 MG tablet Take 1 tablet (25 mg total) by mouth 2 (two) times daily.  . nitroGLYCERIN (NITROSTAT) 0.4 MG SL tablet Place 1 tablet (0.4 mg total) under the tongue every 5 (five) minutes as needed for chest pain.  . pantoprazole (PROTONIX) 40 MG tablet Take 40 mg by mouth See admin instructions. Take 40 mg by mouth one to two times a day  . Potassium Chloride ER 20 MEQ TBCR Take 20 mEq by mouth daily.  . pramipexole (MIRAPEX) 1 MG tablet Take 1-2 mg by mouth See admin instructions. Take 1 mg by mouth in the morning and 2 mg at bedtime  . rosuvastatin (CRESTOR) 5 MG tablet Take 1 tablet (5 mg total) daily by mouth.  . traMADol (ULTRAM) 50 MG tablet Take one tablet by mouth every 4 hours as needed for moderate pain; Take two tablets by mouth every 4 hours as needed for severe pain (Patient taking differently: Take 50 mg by mouth every 4 (four) hours as needed (for pain). )  . trolamine salicylate (ASPERCREME) 10 % cream Apply 1 application topically as needed for muscle pain.  Marland Kitchen  venlafaxine (EFFEXOR) 75 MG tablet Take 75 mg by mouth 2 (two) times daily.   . [DISCONTINUED] amLODipine (NORVASC) 10 MG tablet Take 1 tablet (10 mg total) by mouth daily.  . [DISCONTINUED] potassium chloride 20 MEQ TBCR Take 20 mEq by mouth daily.     Allergies:   Other; Sulfamethoxazole-trimethoprim; Cortisone; Latex; and Tape   Social History   Tobacco Use  . Smoking status: Former Smoker    Types: Cigarettes    Last attempt to quit: 09/13/1960    Years since quitting: 57.3  . Smokeless tobacco: Never Used  Substance Use Topics  . Alcohol use: No  . Drug use: No     Family Hx: The patient's family history includes Breast cancer in her sister; Cancer in her father; Coronary artery disease in her brother, brother, brother, brother, and brother; Heart attack in her brother and mother.  ROS:   Please see the history of present illness.  Review of Systems  Constitution: Positive for diaphoresis and malaise/fatigue.  Cardiovascular: Positive for dyspnea on exertion.  Respiratory: Positive for cough, snoring and wheezing.   Hematologic/Lymphatic: Bruises/bleeds easily.  Musculoskeletal: Positive for back pain, joint pain, joint swelling and myalgias.  Neurological: Positive for dizziness and loss of balance.   All other systems reviewed and are negative.   EKGs/Labs/Other Test Reviewed:    EKG:  EKG is not ordered today.   Recent Labs: 05/31/2017: TSH 8.170 09/28/2017: B Natriuretic Peptide 446.4 09/30/2017: Magnesium 2.3 10/31/2017: ALT 14 11/14/2017: BUN 17; Creatinine, Ser 1.20; Hemoglobin 10.4; Platelets 194; Potassium 3.6; Sodium 138   Recent Lipid Panel Lab Results  Component Value Date/Time   CHOL 178 10/31/2017 10:04 AM   TRIG 125 10/31/2017 10:04 AM   HDL 57 10/31/2017 10:04 AM   CHOLHDL 3.1 10/31/2017 10:04 AM   CHOLHDL 5.0 07/29/2015 03:37 PM   LDLCALC 96 10/31/2017 10:04 AM   LDLDIRECT 144.0 11/21/2010 10:32 AM    Physical Exam:    VS:  BP 116/82    Pulse 63   Ht 5' 5.5" (1.664 m)   Wt 256 lb 12.8 oz (116.5 kg)   SpO2 93%   BMI 42.08 kg/m     Wt Readings from Last 3 Encounters:  12/18/17 256 lb 12.8 oz (116.5 kg)  11/14/17 249 lb (112.9 kg)  11/12/17 249 lb (112.9 kg)     Physical Exam  Constitutional: She is oriented to person, place, and time. She appears well-developed and well-nourished. No distress.  HENT:  Head: Normocephalic and atraumatic.  Eyes: No scleral icterus.  Neck: Neck supple.  Cardiovascular: Normal rate and regular rhythm.  No murmur (no murmur appreciated today) heard. Pulmonary/Chest: Effort normal. She has no wheezes. She has no rales.  Abdominal: Soft. There is no tenderness.  Musculoskeletal: She exhibits edema (trace-1+ bilat LE edema).  Neurological: She is alert and oriented to person, place, and time.  Skin: Skin is warm and dry.  Psychiatric: She has a normal mood and affect.    ASSESSMENT & PLAN:    Coronary artery disease involving native coronary artery of native heart without angina pectoris Status post prior PCI with DES to the RCA in 2009 and subsequent CABG in 2017.  Recent cardiac catheterization demonstrated patent LIMA-LAD and patent distal RCA stent.  She does have moderate disease in the mid RCA.  This was unchanged from her cardiac catheterization prior to her CABG.  Medical therapy has been recommended.  She denies any recurrent chest symptoms.  Continue current medical therapy with beta-blocker, statin.  She is not on aspirin as she is on Apixaban.  PAF (paroxysmal atrial fibrillation) (HCC) Maintaining normal sinus rhythm.  She is tolerating anticoagulation with Apixaban.  Continue current medical therapy.  Chronic diastolic CHF (congestive heart failure) (HCC) Volume status overall stable.  Continue current medical regimen.  Iron deficiency anemia, unspecified iron deficiency anemia type She has been evaluated by gastroenterology.  EGD/colonoscopy has been recommended.   However, she is moving to [place].  She will have this evaluated further when she gets settled.  Essential hypertension The patient's blood pressure is controlled on her current regimen.  Continue current therapy.   Mixed hyperlipidemia Continue statin therapy.   Dispo:  Return with Dr. Acie Fredrickson.   Medication Adjustments/Labs and Tests Ordered: Current medicines are reviewed at length with the patient today.  Concerns regarding medicines are outlined above.  Tests Ordered: No orders of the defined types were placed in this encounter.  Medication Changes: Meds ordered this encounter  Medications  . Potassium Chloride ER 20 MEQ TBCR    Sig: Take 20 mEq by mouth daily.    Dispense:  90 tablet    Refill:  2    Signed, [name], PA-C  12/18/2017 4:42 PM    [facility] [address], [place], [place]  [address] Phone: [phone]; Fax: [phone]

## 2018-02-20 ENCOUNTER — Other Ambulatory Visit: Payer: Self-pay | Admitting: Cardiovascular Disease

## 2018-03-02 DIAGNOSIS — I480 Paroxysmal atrial fibrillation: Secondary | ICD-10-CM | POA: Diagnosis present

## 2018-03-02 DIAGNOSIS — R079 Chest pain, unspecified: Secondary | ICD-10-CM | POA: Diagnosis not present

## 2018-03-02 DIAGNOSIS — Z79899 Other long term (current) drug therapy: Secondary | ICD-10-CM | POA: Diagnosis not present

## 2018-03-02 DIAGNOSIS — Z6841 Body Mass Index (BMI) 40.0 and over, adult: Secondary | ICD-10-CM | POA: Diagnosis not present

## 2018-03-02 DIAGNOSIS — Z79890 Hormone replacement therapy (postmenopausal): Secondary | ICD-10-CM | POA: Diagnosis not present

## 2018-03-02 DIAGNOSIS — I080 Rheumatic disorders of both mitral and aortic valves: Secondary | ICD-10-CM | POA: Diagnosis not present

## 2018-03-02 DIAGNOSIS — I252 Old myocardial infarction: Secondary | ICD-10-CM | POA: Diagnosis not present

## 2018-03-02 DIAGNOSIS — R0602 Shortness of breath: Secondary | ICD-10-CM | POA: Diagnosis not present

## 2018-03-02 DIAGNOSIS — I10 Essential (primary) hypertension: Secondary | ICD-10-CM | POA: Diagnosis not present

## 2018-03-02 DIAGNOSIS — I2510 Atherosclerotic heart disease of native coronary artery without angina pectoris: Secondary | ICD-10-CM | POA: Diagnosis present

## 2018-03-02 DIAGNOSIS — Z7982 Long term (current) use of aspirin: Secondary | ICD-10-CM | POA: Diagnosis not present

## 2018-03-02 DIAGNOSIS — M797 Fibromyalgia: Secondary | ICD-10-CM | POA: Diagnosis present

## 2018-03-02 DIAGNOSIS — Z951 Presence of aortocoronary bypass graft: Secondary | ICD-10-CM | POA: Diagnosis not present

## 2018-03-02 DIAGNOSIS — Z8249 Family history of ischemic heart disease and other diseases of the circulatory system: Secondary | ICD-10-CM | POA: Diagnosis not present

## 2018-03-02 DIAGNOSIS — M1990 Unspecified osteoarthritis, unspecified site: Secondary | ICD-10-CM | POA: Diagnosis present

## 2018-03-02 DIAGNOSIS — Z955 Presence of coronary angioplasty implant and graft: Secondary | ICD-10-CM | POA: Diagnosis not present

## 2018-03-02 DIAGNOSIS — R9431 Abnormal electrocardiogram [ECG] [EKG]: Secondary | ICD-10-CM | POA: Diagnosis not present

## 2018-03-02 DIAGNOSIS — I214 Non-ST elevation (NSTEMI) myocardial infarction: Secondary | ICD-10-CM | POA: Diagnosis not present

## 2018-03-02 DIAGNOSIS — E039 Hypothyroidism, unspecified: Secondary | ICD-10-CM | POA: Diagnosis present

## 2018-03-02 DIAGNOSIS — I771 Stricture of artery: Secondary | ICD-10-CM | POA: Diagnosis present

## 2018-03-02 DIAGNOSIS — I110 Hypertensive heart disease with heart failure: Secondary | ICD-10-CM | POA: Diagnosis not present

## 2018-03-02 DIAGNOSIS — R05 Cough: Secondary | ICD-10-CM | POA: Diagnosis present

## 2018-03-02 DIAGNOSIS — Z7901 Long term (current) use of anticoagulants: Secondary | ICD-10-CM | POA: Diagnosis not present

## 2018-03-02 DIAGNOSIS — E785 Hyperlipidemia, unspecified: Secondary | ICD-10-CM | POA: Diagnosis present

## 2018-03-02 DIAGNOSIS — I4892 Unspecified atrial flutter: Secondary | ICD-10-CM | POA: Diagnosis present

## 2018-03-02 DIAGNOSIS — I509 Heart failure, unspecified: Secondary | ICD-10-CM | POA: Diagnosis present

## 2018-03-02 DIAGNOSIS — Z7902 Long term (current) use of antithrombotics/antiplatelets: Secondary | ICD-10-CM | POA: Diagnosis not present

## 2018-03-02 DIAGNOSIS — I700 Atherosclerosis of aorta: Secondary | ICD-10-CM | POA: Diagnosis present

## 2018-03-02 DIAGNOSIS — E669 Obesity, unspecified: Secondary | ICD-10-CM | POA: Diagnosis present

## 2018-03-02 DIAGNOSIS — R42 Dizziness and giddiness: Secondary | ICD-10-CM | POA: Diagnosis present

## 2018-03-02 DIAGNOSIS — Z87440 Personal history of urinary (tract) infections: Secondary | ICD-10-CM | POA: Diagnosis not present

## 2018-03-02 DIAGNOSIS — I4891 Unspecified atrial fibrillation: Secondary | ICD-10-CM | POA: Diagnosis not present

## 2018-03-12 DIAGNOSIS — Z7902 Long term (current) use of antithrombotics/antiplatelets: Secondary | ICD-10-CM | POA: Diagnosis not present

## 2018-03-12 DIAGNOSIS — I160 Hypertensive urgency: Secondary | ICD-10-CM | POA: Diagnosis not present

## 2018-03-12 DIAGNOSIS — R062 Wheezing: Secondary | ICD-10-CM | POA: Diagnosis not present

## 2018-03-12 DIAGNOSIS — I252 Old myocardial infarction: Secondary | ICD-10-CM | POA: Diagnosis not present

## 2018-03-12 DIAGNOSIS — R0609 Other forms of dyspnea: Secondary | ICD-10-CM | POA: Diagnosis not present

## 2018-03-12 DIAGNOSIS — I161 Hypertensive emergency: Secondary | ICD-10-CM | POA: Diagnosis not present

## 2018-03-12 DIAGNOSIS — I110 Hypertensive heart disease with heart failure: Secondary | ICD-10-CM | POA: Diagnosis not present

## 2018-03-12 DIAGNOSIS — E785 Hyperlipidemia, unspecified: Secondary | ICD-10-CM | POA: Diagnosis not present

## 2018-03-12 DIAGNOSIS — I10 Essential (primary) hypertension: Secondary | ICD-10-CM | POA: Diagnosis not present

## 2018-03-12 DIAGNOSIS — R9431 Abnormal electrocardiogram [ECG] [EKG]: Secondary | ICD-10-CM | POA: Diagnosis not present

## 2018-03-12 DIAGNOSIS — M1990 Unspecified osteoarthritis, unspecified site: Secondary | ICD-10-CM | POA: Diagnosis not present

## 2018-03-12 DIAGNOSIS — I2510 Atherosclerotic heart disease of native coronary artery without angina pectoris: Secondary | ICD-10-CM | POA: Diagnosis not present

## 2018-03-12 DIAGNOSIS — I509 Heart failure, unspecified: Secondary | ICD-10-CM | POA: Diagnosis not present

## 2018-03-12 DIAGNOSIS — I4891 Unspecified atrial fibrillation: Secondary | ICD-10-CM | POA: Diagnosis not present

## 2018-03-12 DIAGNOSIS — Z79890 Hormone replacement therapy (postmenopausal): Secondary | ICD-10-CM | POA: Diagnosis not present

## 2018-03-12 DIAGNOSIS — Z87891 Personal history of nicotine dependence: Secondary | ICD-10-CM | POA: Diagnosis not present

## 2018-03-12 DIAGNOSIS — Z951 Presence of aortocoronary bypass graft: Secondary | ICD-10-CM | POA: Diagnosis not present

## 2018-03-12 DIAGNOSIS — R079 Chest pain, unspecified: Secondary | ICD-10-CM | POA: Diagnosis not present

## 2018-03-12 DIAGNOSIS — N179 Acute kidney failure, unspecified: Secondary | ICD-10-CM | POA: Diagnosis not present

## 2018-03-12 DIAGNOSIS — M797 Fibromyalgia: Secondary | ICD-10-CM | POA: Diagnosis not present

## 2018-03-12 DIAGNOSIS — I4892 Unspecified atrial flutter: Secondary | ICD-10-CM | POA: Diagnosis not present

## 2018-03-12 DIAGNOSIS — R0600 Dyspnea, unspecified: Secondary | ICD-10-CM | POA: Diagnosis not present

## 2018-03-12 DIAGNOSIS — E039 Hypothyroidism, unspecified: Secondary | ICD-10-CM | POA: Diagnosis not present

## 2018-03-13 DIAGNOSIS — I160 Hypertensive urgency: Secondary | ICD-10-CM | POA: Diagnosis not present

## 2018-03-13 DIAGNOSIS — E039 Hypothyroidism, unspecified: Secondary | ICD-10-CM | POA: Diagnosis not present

## 2018-03-13 DIAGNOSIS — I4891 Unspecified atrial fibrillation: Secondary | ICD-10-CM | POA: Diagnosis not present

## 2018-03-14 DIAGNOSIS — I4891 Unspecified atrial fibrillation: Secondary | ICD-10-CM | POA: Diagnosis not present

## 2018-03-14 DIAGNOSIS — E039 Hypothyroidism, unspecified: Secondary | ICD-10-CM | POA: Diagnosis not present

## 2018-03-14 DIAGNOSIS — I160 Hypertensive urgency: Secondary | ICD-10-CM | POA: Diagnosis not present

## 2018-03-17 DIAGNOSIS — I509 Heart failure, unspecified: Secondary | ICD-10-CM | POA: Diagnosis not present

## 2018-03-17 DIAGNOSIS — Z7901 Long term (current) use of anticoagulants: Secondary | ICD-10-CM | POA: Diagnosis not present

## 2018-03-17 DIAGNOSIS — I2510 Atherosclerotic heart disease of native coronary artery without angina pectoris: Secondary | ICD-10-CM | POA: Diagnosis not present

## 2018-03-17 DIAGNOSIS — M1990 Unspecified osteoarthritis, unspecified site: Secondary | ICD-10-CM | POA: Diagnosis not present

## 2018-03-17 DIAGNOSIS — I110 Hypertensive heart disease with heart failure: Secondary | ICD-10-CM | POA: Diagnosis not present

## 2018-03-17 DIAGNOSIS — M797 Fibromyalgia: Secondary | ICD-10-CM | POA: Diagnosis not present

## 2018-03-17 DIAGNOSIS — I161 Hypertensive emergency: Secondary | ICD-10-CM | POA: Diagnosis not present

## 2018-03-17 DIAGNOSIS — E785 Hyperlipidemia, unspecified: Secondary | ICD-10-CM | POA: Diagnosis not present

## 2018-03-17 DIAGNOSIS — I252 Old myocardial infarction: Secondary | ICD-10-CM | POA: Diagnosis not present

## 2018-03-17 DIAGNOSIS — E039 Hypothyroidism, unspecified: Secondary | ICD-10-CM | POA: Diagnosis not present

## 2018-03-17 DIAGNOSIS — Z87891 Personal history of nicotine dependence: Secondary | ICD-10-CM | POA: Diagnosis not present

## 2018-03-17 DIAGNOSIS — I482 Chronic atrial fibrillation, unspecified: Secondary | ICD-10-CM | POA: Diagnosis not present

## 2018-03-19 DIAGNOSIS — I2510 Atherosclerotic heart disease of native coronary artery without angina pectoris: Secondary | ICD-10-CM | POA: Diagnosis not present

## 2018-03-19 DIAGNOSIS — E559 Vitamin D deficiency, unspecified: Secondary | ICD-10-CM | POA: Diagnosis not present

## 2018-03-19 DIAGNOSIS — E039 Hypothyroidism, unspecified: Secondary | ICD-10-CM | POA: Diagnosis not present

## 2018-03-19 DIAGNOSIS — I10 Essential (primary) hypertension: Secondary | ICD-10-CM | POA: Diagnosis not present

## 2018-03-19 DIAGNOSIS — M2550 Pain in unspecified joint: Secondary | ICD-10-CM | POA: Diagnosis not present

## 2018-03-20 DIAGNOSIS — I252 Old myocardial infarction: Secondary | ICD-10-CM | POA: Diagnosis not present

## 2018-03-20 DIAGNOSIS — I2510 Atherosclerotic heart disease of native coronary artery without angina pectoris: Secondary | ICD-10-CM | POA: Diagnosis not present

## 2018-03-20 DIAGNOSIS — I110 Hypertensive heart disease with heart failure: Secondary | ICD-10-CM | POA: Diagnosis not present

## 2018-03-20 DIAGNOSIS — I482 Chronic atrial fibrillation, unspecified: Secondary | ICD-10-CM | POA: Diagnosis not present

## 2018-03-20 DIAGNOSIS — M1990 Unspecified osteoarthritis, unspecified site: Secondary | ICD-10-CM | POA: Diagnosis not present

## 2018-03-20 DIAGNOSIS — I509 Heart failure, unspecified: Secondary | ICD-10-CM | POA: Diagnosis not present

## 2018-03-24 DIAGNOSIS — I2510 Atherosclerotic heart disease of native coronary artery without angina pectoris: Secondary | ICD-10-CM | POA: Diagnosis not present

## 2018-03-24 DIAGNOSIS — I482 Chronic atrial fibrillation, unspecified: Secondary | ICD-10-CM | POA: Diagnosis not present

## 2018-03-24 DIAGNOSIS — I252 Old myocardial infarction: Secondary | ICD-10-CM | POA: Diagnosis not present

## 2018-03-24 DIAGNOSIS — I110 Hypertensive heart disease with heart failure: Secondary | ICD-10-CM | POA: Diagnosis not present

## 2018-03-24 DIAGNOSIS — I509 Heart failure, unspecified: Secondary | ICD-10-CM | POA: Diagnosis not present

## 2018-03-24 DIAGNOSIS — M1990 Unspecified osteoarthritis, unspecified site: Secondary | ICD-10-CM | POA: Diagnosis not present

## 2018-03-26 DIAGNOSIS — I2510 Atherosclerotic heart disease of native coronary artery without angina pectoris: Secondary | ICD-10-CM | POA: Diagnosis not present

## 2018-03-26 DIAGNOSIS — M1990 Unspecified osteoarthritis, unspecified site: Secondary | ICD-10-CM | POA: Diagnosis not present

## 2018-03-26 DIAGNOSIS — I509 Heart failure, unspecified: Secondary | ICD-10-CM | POA: Diagnosis not present

## 2018-03-26 DIAGNOSIS — I110 Hypertensive heart disease with heart failure: Secondary | ICD-10-CM | POA: Diagnosis not present

## 2018-03-26 DIAGNOSIS — I482 Chronic atrial fibrillation, unspecified: Secondary | ICD-10-CM | POA: Diagnosis not present

## 2018-03-26 DIAGNOSIS — I252 Old myocardial infarction: Secondary | ICD-10-CM | POA: Diagnosis not present

## 2018-03-27 DIAGNOSIS — I2510 Atherosclerotic heart disease of native coronary artery without angina pectoris: Secondary | ICD-10-CM | POA: Diagnosis not present

## 2018-03-27 DIAGNOSIS — Z951 Presence of aortocoronary bypass graft: Secondary | ICD-10-CM | POA: Diagnosis not present

## 2018-03-27 DIAGNOSIS — I10 Essential (primary) hypertension: Secondary | ICD-10-CM | POA: Diagnosis not present

## 2018-03-27 DIAGNOSIS — Z955 Presence of coronary angioplasty implant and graft: Secondary | ICD-10-CM | POA: Diagnosis not present

## 2018-03-27 DIAGNOSIS — Z7901 Long term (current) use of anticoagulants: Secondary | ICD-10-CM | POA: Diagnosis not present

## 2018-03-31 DIAGNOSIS — I509 Heart failure, unspecified: Secondary | ICD-10-CM | POA: Diagnosis not present

## 2018-03-31 DIAGNOSIS — I252 Old myocardial infarction: Secondary | ICD-10-CM | POA: Diagnosis not present

## 2018-03-31 DIAGNOSIS — M1990 Unspecified osteoarthritis, unspecified site: Secondary | ICD-10-CM | POA: Diagnosis not present

## 2018-03-31 DIAGNOSIS — I2510 Atherosclerotic heart disease of native coronary artery without angina pectoris: Secondary | ICD-10-CM | POA: Diagnosis not present

## 2018-03-31 DIAGNOSIS — I482 Chronic atrial fibrillation, unspecified: Secondary | ICD-10-CM | POA: Diagnosis not present

## 2018-03-31 DIAGNOSIS — I110 Hypertensive heart disease with heart failure: Secondary | ICD-10-CM | POA: Diagnosis not present

## 2018-04-01 DIAGNOSIS — I110 Hypertensive heart disease with heart failure: Secondary | ICD-10-CM | POA: Diagnosis not present

## 2018-04-01 DIAGNOSIS — I509 Heart failure, unspecified: Secondary | ICD-10-CM | POA: Diagnosis not present

## 2018-04-01 DIAGNOSIS — I2510 Atherosclerotic heart disease of native coronary artery without angina pectoris: Secondary | ICD-10-CM | POA: Diagnosis not present

## 2018-04-01 DIAGNOSIS — I482 Chronic atrial fibrillation, unspecified: Secondary | ICD-10-CM | POA: Diagnosis not present

## 2018-04-01 DIAGNOSIS — I252 Old myocardial infarction: Secondary | ICD-10-CM | POA: Diagnosis not present

## 2018-04-01 DIAGNOSIS — M1990 Unspecified osteoarthritis, unspecified site: Secondary | ICD-10-CM | POA: Diagnosis not present

## 2018-04-03 DIAGNOSIS — I2510 Atherosclerotic heart disease of native coronary artery without angina pectoris: Secondary | ICD-10-CM | POA: Diagnosis not present

## 2018-04-03 DIAGNOSIS — I509 Heart failure, unspecified: Secondary | ICD-10-CM | POA: Diagnosis not present

## 2018-04-03 DIAGNOSIS — I110 Hypertensive heart disease with heart failure: Secondary | ICD-10-CM | POA: Diagnosis not present

## 2018-04-03 DIAGNOSIS — M1990 Unspecified osteoarthritis, unspecified site: Secondary | ICD-10-CM | POA: Diagnosis not present

## 2018-04-03 DIAGNOSIS — I252 Old myocardial infarction: Secondary | ICD-10-CM | POA: Diagnosis not present

## 2018-04-03 DIAGNOSIS — I482 Chronic atrial fibrillation, unspecified: Secondary | ICD-10-CM | POA: Diagnosis not present

## 2018-04-04 DIAGNOSIS — I110 Hypertensive heart disease with heart failure: Secondary | ICD-10-CM | POA: Diagnosis not present

## 2018-04-04 DIAGNOSIS — M1990 Unspecified osteoarthritis, unspecified site: Secondary | ICD-10-CM | POA: Diagnosis not present

## 2018-04-04 DIAGNOSIS — I482 Chronic atrial fibrillation, unspecified: Secondary | ICD-10-CM | POA: Diagnosis not present

## 2018-04-04 DIAGNOSIS — I2510 Atherosclerotic heart disease of native coronary artery without angina pectoris: Secondary | ICD-10-CM | POA: Diagnosis not present

## 2018-04-04 DIAGNOSIS — I509 Heart failure, unspecified: Secondary | ICD-10-CM | POA: Diagnosis not present

## 2018-04-04 DIAGNOSIS — I252 Old myocardial infarction: Secondary | ICD-10-CM | POA: Diagnosis not present

## 2018-04-08 DIAGNOSIS — I252 Old myocardial infarction: Secondary | ICD-10-CM | POA: Diagnosis not present

## 2018-04-08 DIAGNOSIS — I2510 Atherosclerotic heart disease of native coronary artery without angina pectoris: Secondary | ICD-10-CM | POA: Diagnosis not present

## 2018-04-08 DIAGNOSIS — I110 Hypertensive heart disease with heart failure: Secondary | ICD-10-CM | POA: Diagnosis not present

## 2018-04-08 DIAGNOSIS — I509 Heart failure, unspecified: Secondary | ICD-10-CM | POA: Diagnosis not present

## 2018-04-08 DIAGNOSIS — M1990 Unspecified osteoarthritis, unspecified site: Secondary | ICD-10-CM | POA: Diagnosis not present

## 2018-04-08 DIAGNOSIS — I482 Chronic atrial fibrillation, unspecified: Secondary | ICD-10-CM | POA: Diagnosis not present

## 2018-04-09 DIAGNOSIS — I482 Chronic atrial fibrillation, unspecified: Secondary | ICD-10-CM | POA: Diagnosis not present

## 2018-04-09 DIAGNOSIS — I252 Old myocardial infarction: Secondary | ICD-10-CM | POA: Diagnosis not present

## 2018-04-09 DIAGNOSIS — M1990 Unspecified osteoarthritis, unspecified site: Secondary | ICD-10-CM | POA: Diagnosis not present

## 2018-04-09 DIAGNOSIS — I110 Hypertensive heart disease with heart failure: Secondary | ICD-10-CM | POA: Diagnosis not present

## 2018-04-09 DIAGNOSIS — I509 Heart failure, unspecified: Secondary | ICD-10-CM | POA: Diagnosis not present

## 2018-04-09 DIAGNOSIS — I2510 Atherosclerotic heart disease of native coronary artery without angina pectoris: Secondary | ICD-10-CM | POA: Diagnosis not present

## 2018-04-11 DIAGNOSIS — M5136 Other intervertebral disc degeneration, lumbar region: Secondary | ICD-10-CM | POA: Diagnosis not present

## 2018-04-11 DIAGNOSIS — M1990 Unspecified osteoarthritis, unspecified site: Secondary | ICD-10-CM | POA: Diagnosis not present

## 2018-04-11 DIAGNOSIS — I482 Chronic atrial fibrillation, unspecified: Secondary | ICD-10-CM | POA: Diagnosis not present

## 2018-04-11 DIAGNOSIS — I110 Hypertensive heart disease with heart failure: Secondary | ICD-10-CM | POA: Diagnosis not present

## 2018-04-11 DIAGNOSIS — M5440 Lumbago with sciatica, unspecified side: Secondary | ICD-10-CM | POA: Diagnosis not present

## 2018-04-11 DIAGNOSIS — I2510 Atherosclerotic heart disease of native coronary artery without angina pectoris: Secondary | ICD-10-CM | POA: Diagnosis not present

## 2018-04-11 DIAGNOSIS — M5416 Radiculopathy, lumbar region: Secondary | ICD-10-CM | POA: Diagnosis not present

## 2018-04-11 DIAGNOSIS — M4306 Spondylolysis, lumbar region: Secondary | ICD-10-CM | POA: Diagnosis not present

## 2018-04-11 DIAGNOSIS — I252 Old myocardial infarction: Secondary | ICD-10-CM | POA: Diagnosis not present

## 2018-04-11 DIAGNOSIS — I509 Heart failure, unspecified: Secondary | ICD-10-CM | POA: Diagnosis not present

## 2018-04-14 DIAGNOSIS — I252 Old myocardial infarction: Secondary | ICD-10-CM | POA: Diagnosis not present

## 2018-04-14 DIAGNOSIS — M1990 Unspecified osteoarthritis, unspecified site: Secondary | ICD-10-CM | POA: Diagnosis not present

## 2018-04-14 DIAGNOSIS — I110 Hypertensive heart disease with heart failure: Secondary | ICD-10-CM | POA: Diagnosis not present

## 2018-04-14 DIAGNOSIS — I509 Heart failure, unspecified: Secondary | ICD-10-CM | POA: Diagnosis not present

## 2018-04-14 DIAGNOSIS — I2510 Atherosclerotic heart disease of native coronary artery without angina pectoris: Secondary | ICD-10-CM | POA: Diagnosis not present

## 2018-04-14 DIAGNOSIS — I482 Chronic atrial fibrillation, unspecified: Secondary | ICD-10-CM | POA: Diagnosis not present

## 2018-04-15 DIAGNOSIS — R0981 Nasal congestion: Secondary | ICD-10-CM | POA: Diagnosis not present

## 2018-04-15 DIAGNOSIS — J40 Bronchitis, not specified as acute or chronic: Secondary | ICD-10-CM | POA: Diagnosis not present

## 2018-04-15 DIAGNOSIS — R05 Cough: Secondary | ICD-10-CM | POA: Diagnosis not present

## 2018-04-16 DIAGNOSIS — M1990 Unspecified osteoarthritis, unspecified site: Secondary | ICD-10-CM | POA: Diagnosis not present

## 2018-04-16 DIAGNOSIS — I482 Chronic atrial fibrillation, unspecified: Secondary | ICD-10-CM | POA: Diagnosis not present

## 2018-04-16 DIAGNOSIS — I252 Old myocardial infarction: Secondary | ICD-10-CM | POA: Diagnosis not present

## 2018-04-16 DIAGNOSIS — I509 Heart failure, unspecified: Secondary | ICD-10-CM | POA: Diagnosis not present

## 2018-04-16 DIAGNOSIS — I110 Hypertensive heart disease with heart failure: Secondary | ICD-10-CM | POA: Diagnosis not present

## 2018-04-16 DIAGNOSIS — I2510 Atherosclerotic heart disease of native coronary artery without angina pectoris: Secondary | ICD-10-CM | POA: Diagnosis not present

## 2018-04-22 DIAGNOSIS — M4316 Spondylolisthesis, lumbar region: Secondary | ICD-10-CM | POA: Diagnosis not present

## 2018-04-22 DIAGNOSIS — M4186 Other forms of scoliosis, lumbar region: Secondary | ICD-10-CM | POA: Diagnosis not present

## 2018-04-23 DIAGNOSIS — I2510 Atherosclerotic heart disease of native coronary artery without angina pectoris: Secondary | ICD-10-CM | POA: Diagnosis not present

## 2018-04-23 DIAGNOSIS — I509 Heart failure, unspecified: Secondary | ICD-10-CM | POA: Diagnosis not present

## 2018-04-23 DIAGNOSIS — I110 Hypertensive heart disease with heart failure: Secondary | ICD-10-CM | POA: Diagnosis not present

## 2018-04-23 DIAGNOSIS — M545 Low back pain: Secondary | ICD-10-CM | POA: Diagnosis not present

## 2018-04-23 DIAGNOSIS — I482 Chronic atrial fibrillation, unspecified: Secondary | ICD-10-CM | POA: Diagnosis not present

## 2018-04-23 DIAGNOSIS — M1990 Unspecified osteoarthritis, unspecified site: Secondary | ICD-10-CM | POA: Diagnosis not present

## 2018-04-23 DIAGNOSIS — M4306 Spondylolysis, lumbar region: Secondary | ICD-10-CM | POA: Diagnosis not present

## 2018-04-23 DIAGNOSIS — I252 Old myocardial infarction: Secondary | ICD-10-CM | POA: Diagnosis not present

## 2018-04-23 DIAGNOSIS — M5126 Other intervertebral disc displacement, lumbar region: Secondary | ICD-10-CM | POA: Diagnosis not present

## 2018-04-23 DIAGNOSIS — M5416 Radiculopathy, lumbar region: Secondary | ICD-10-CM | POA: Diagnosis not present

## 2018-04-25 DIAGNOSIS — I482 Chronic atrial fibrillation, unspecified: Secondary | ICD-10-CM | POA: Diagnosis not present

## 2018-04-25 DIAGNOSIS — I509 Heart failure, unspecified: Secondary | ICD-10-CM | POA: Diagnosis not present

## 2018-04-25 DIAGNOSIS — I252 Old myocardial infarction: Secondary | ICD-10-CM | POA: Diagnosis not present

## 2018-04-25 DIAGNOSIS — I110 Hypertensive heart disease with heart failure: Secondary | ICD-10-CM | POA: Diagnosis not present

## 2018-04-25 DIAGNOSIS — I2510 Atherosclerotic heart disease of native coronary artery without angina pectoris: Secondary | ICD-10-CM | POA: Diagnosis not present

## 2018-04-25 DIAGNOSIS — M1990 Unspecified osteoarthritis, unspecified site: Secondary | ICD-10-CM | POA: Diagnosis not present

## 2018-05-01 DIAGNOSIS — I509 Heart failure, unspecified: Secondary | ICD-10-CM | POA: Diagnosis not present

## 2018-05-01 DIAGNOSIS — I252 Old myocardial infarction: Secondary | ICD-10-CM | POA: Diagnosis not present

## 2018-05-01 DIAGNOSIS — I482 Chronic atrial fibrillation, unspecified: Secondary | ICD-10-CM | POA: Diagnosis not present

## 2018-05-01 DIAGNOSIS — M1990 Unspecified osteoarthritis, unspecified site: Secondary | ICD-10-CM | POA: Diagnosis not present

## 2018-05-01 DIAGNOSIS — I110 Hypertensive heart disease with heart failure: Secondary | ICD-10-CM | POA: Diagnosis not present

## 2018-05-01 DIAGNOSIS — I2510 Atherosclerotic heart disease of native coronary artery without angina pectoris: Secondary | ICD-10-CM | POA: Diagnosis not present

## 2018-05-06 DIAGNOSIS — I482 Chronic atrial fibrillation, unspecified: Secondary | ICD-10-CM | POA: Diagnosis not present

## 2018-05-06 DIAGNOSIS — I509 Heart failure, unspecified: Secondary | ICD-10-CM | POA: Diagnosis not present

## 2018-05-06 DIAGNOSIS — I110 Hypertensive heart disease with heart failure: Secondary | ICD-10-CM | POA: Diagnosis not present

## 2018-05-06 DIAGNOSIS — I2510 Atherosclerotic heart disease of native coronary artery without angina pectoris: Secondary | ICD-10-CM | POA: Diagnosis not present

## 2018-05-06 DIAGNOSIS — M1990 Unspecified osteoarthritis, unspecified site: Secondary | ICD-10-CM | POA: Diagnosis not present

## 2018-05-06 DIAGNOSIS — I252 Old myocardial infarction: Secondary | ICD-10-CM | POA: Diagnosis not present

## 2018-05-12 DIAGNOSIS — I482 Chronic atrial fibrillation, unspecified: Secondary | ICD-10-CM | POA: Diagnosis not present

## 2018-05-12 DIAGNOSIS — I509 Heart failure, unspecified: Secondary | ICD-10-CM | POA: Diagnosis not present

## 2018-05-12 DIAGNOSIS — M1990 Unspecified osteoarthritis, unspecified site: Secondary | ICD-10-CM | POA: Diagnosis not present

## 2018-05-12 DIAGNOSIS — I110 Hypertensive heart disease with heart failure: Secondary | ICD-10-CM | POA: Diagnosis not present

## 2018-05-12 DIAGNOSIS — I2510 Atherosclerotic heart disease of native coronary artery without angina pectoris: Secondary | ICD-10-CM | POA: Diagnosis not present

## 2018-05-12 DIAGNOSIS — I252 Old myocardial infarction: Secondary | ICD-10-CM | POA: Diagnosis not present

## 2018-05-16 DIAGNOSIS — M545 Low back pain: Secondary | ICD-10-CM | POA: Diagnosis not present

## 2018-05-16 DIAGNOSIS — I482 Chronic atrial fibrillation, unspecified: Secondary | ICD-10-CM | POA: Diagnosis not present

## 2018-05-16 DIAGNOSIS — Z7901 Long term (current) use of anticoagulants: Secondary | ICD-10-CM | POA: Diagnosis not present

## 2018-05-16 DIAGNOSIS — I110 Hypertensive heart disease with heart failure: Secondary | ICD-10-CM | POA: Diagnosis not present

## 2018-05-16 DIAGNOSIS — M797 Fibromyalgia: Secondary | ICD-10-CM | POA: Diagnosis not present

## 2018-05-16 DIAGNOSIS — Z9181 History of falling: Secondary | ICD-10-CM | POA: Diagnosis not present

## 2018-05-16 DIAGNOSIS — Z951 Presence of aortocoronary bypass graft: Secondary | ICD-10-CM | POA: Diagnosis not present

## 2018-05-16 DIAGNOSIS — I2510 Atherosclerotic heart disease of native coronary artery without angina pectoris: Secondary | ICD-10-CM | POA: Diagnosis not present

## 2018-05-16 DIAGNOSIS — E785 Hyperlipidemia, unspecified: Secondary | ICD-10-CM | POA: Diagnosis not present

## 2018-05-16 DIAGNOSIS — Z7902 Long term (current) use of antithrombotics/antiplatelets: Secondary | ICD-10-CM | POA: Diagnosis not present

## 2018-05-16 DIAGNOSIS — M1990 Unspecified osteoarthritis, unspecified site: Secondary | ICD-10-CM | POA: Diagnosis not present

## 2018-05-16 DIAGNOSIS — E039 Hypothyroidism, unspecified: Secondary | ICD-10-CM | POA: Diagnosis not present

## 2018-05-16 DIAGNOSIS — I252 Old myocardial infarction: Secondary | ICD-10-CM | POA: Diagnosis not present

## 2018-05-16 DIAGNOSIS — Z87891 Personal history of nicotine dependence: Secondary | ICD-10-CM | POA: Diagnosis not present

## 2018-05-16 DIAGNOSIS — I509 Heart failure, unspecified: Secondary | ICD-10-CM | POA: Diagnosis not present

## 2018-05-21 DIAGNOSIS — I2510 Atherosclerotic heart disease of native coronary artery without angina pectoris: Secondary | ICD-10-CM | POA: Diagnosis not present

## 2018-05-21 DIAGNOSIS — M797 Fibromyalgia: Secondary | ICD-10-CM | POA: Diagnosis not present

## 2018-05-21 DIAGNOSIS — I110 Hypertensive heart disease with heart failure: Secondary | ICD-10-CM | POA: Diagnosis not present

## 2018-05-21 DIAGNOSIS — I509 Heart failure, unspecified: Secondary | ICD-10-CM | POA: Diagnosis not present

## 2018-05-21 DIAGNOSIS — M545 Low back pain: Secondary | ICD-10-CM | POA: Diagnosis not present

## 2018-05-21 DIAGNOSIS — M1990 Unspecified osteoarthritis, unspecified site: Secondary | ICD-10-CM | POA: Diagnosis not present

## 2018-05-23 DIAGNOSIS — M797 Fibromyalgia: Secondary | ICD-10-CM | POA: Diagnosis not present

## 2018-05-23 DIAGNOSIS — M545 Low back pain: Secondary | ICD-10-CM | POA: Diagnosis not present

## 2018-05-23 DIAGNOSIS — I2510 Atherosclerotic heart disease of native coronary artery without angina pectoris: Secondary | ICD-10-CM | POA: Diagnosis not present

## 2018-05-23 DIAGNOSIS — I509 Heart failure, unspecified: Secondary | ICD-10-CM | POA: Diagnosis not present

## 2018-05-23 DIAGNOSIS — M1990 Unspecified osteoarthritis, unspecified site: Secondary | ICD-10-CM | POA: Diagnosis not present

## 2018-05-23 DIAGNOSIS — I110 Hypertensive heart disease with heart failure: Secondary | ICD-10-CM | POA: Diagnosis not present

## 2018-05-24 DIAGNOSIS — K838 Other specified diseases of biliary tract: Secondary | ICD-10-CM | POA: Diagnosis not present

## 2018-05-24 DIAGNOSIS — R1031 Right lower quadrant pain: Secondary | ICD-10-CM | POA: Diagnosis not present

## 2018-05-24 DIAGNOSIS — K7460 Unspecified cirrhosis of liver: Secondary | ICD-10-CM | POA: Diagnosis not present

## 2018-05-24 DIAGNOSIS — I10 Essential (primary) hypertension: Secondary | ICD-10-CM | POA: Diagnosis not present

## 2018-05-24 DIAGNOSIS — R109 Unspecified abdominal pain: Secondary | ICD-10-CM | POA: Diagnosis not present

## 2018-05-24 DIAGNOSIS — E441 Mild protein-calorie malnutrition: Secondary | ICD-10-CM | POA: Diagnosis not present

## 2018-05-24 DIAGNOSIS — R1011 Right upper quadrant pain: Secondary | ICD-10-CM | POA: Diagnosis not present

## 2018-05-24 DIAGNOSIS — I4891 Unspecified atrial fibrillation: Secondary | ICD-10-CM | POA: Diagnosis not present

## 2018-05-24 DIAGNOSIS — M549 Dorsalgia, unspecified: Secondary | ICD-10-CM | POA: Diagnosis not present

## 2018-05-24 DIAGNOSIS — R9431 Abnormal electrocardiogram [ECG] [EKG]: Secondary | ICD-10-CM | POA: Diagnosis not present

## 2018-05-24 DIAGNOSIS — K449 Diaphragmatic hernia without obstruction or gangrene: Secondary | ICD-10-CM | POA: Diagnosis not present

## 2018-05-25 DIAGNOSIS — E441 Mild protein-calorie malnutrition: Secondary | ICD-10-CM | POA: Diagnosis present

## 2018-05-25 DIAGNOSIS — I4891 Unspecified atrial fibrillation: Secondary | ICD-10-CM | POA: Diagnosis present

## 2018-05-25 DIAGNOSIS — Z9049 Acquired absence of other specified parts of digestive tract: Secondary | ICD-10-CM | POA: Diagnosis not present

## 2018-05-25 DIAGNOSIS — K7460 Unspecified cirrhosis of liver: Secondary | ICD-10-CM | POA: Diagnosis present

## 2018-05-25 DIAGNOSIS — I10 Essential (primary) hypertension: Secondary | ICD-10-CM | POA: Diagnosis present

## 2018-05-25 DIAGNOSIS — K838 Other specified diseases of biliary tract: Secondary | ICD-10-CM | POA: Diagnosis not present

## 2018-05-25 DIAGNOSIS — Z9851 Tubal ligation status: Secondary | ICD-10-CM | POA: Diagnosis not present

## 2018-05-25 DIAGNOSIS — M549 Dorsalgia, unspecified: Secondary | ICD-10-CM | POA: Diagnosis present

## 2018-05-25 DIAGNOSIS — I493 Ventricular premature depolarization: Secondary | ICD-10-CM | POA: Diagnosis not present

## 2018-05-25 DIAGNOSIS — R1011 Right upper quadrant pain: Secondary | ICD-10-CM | POA: Diagnosis present

## 2018-05-29 DIAGNOSIS — J040 Acute laryngitis: Secondary | ICD-10-CM | POA: Diagnosis not present

## 2018-05-29 DIAGNOSIS — J3489 Other specified disorders of nose and nasal sinuses: Secondary | ICD-10-CM | POA: Diagnosis not present

## 2018-05-29 DIAGNOSIS — R05 Cough: Secondary | ICD-10-CM | POA: Diagnosis not present

## 2018-05-30 DIAGNOSIS — M797 Fibromyalgia: Secondary | ICD-10-CM | POA: Diagnosis not present

## 2018-05-30 DIAGNOSIS — M545 Low back pain: Secondary | ICD-10-CM | POA: Diagnosis not present

## 2018-05-30 DIAGNOSIS — I2510 Atherosclerotic heart disease of native coronary artery without angina pectoris: Secondary | ICD-10-CM | POA: Diagnosis not present

## 2018-05-30 DIAGNOSIS — M1990 Unspecified osteoarthritis, unspecified site: Secondary | ICD-10-CM | POA: Diagnosis not present

## 2018-05-30 DIAGNOSIS — I110 Hypertensive heart disease with heart failure: Secondary | ICD-10-CM | POA: Diagnosis not present

## 2018-05-30 DIAGNOSIS — I509 Heart failure, unspecified: Secondary | ICD-10-CM | POA: Diagnosis not present

## 2018-06-03 DIAGNOSIS — I110 Hypertensive heart disease with heart failure: Secondary | ICD-10-CM | POA: Diagnosis not present

## 2018-06-03 DIAGNOSIS — M1990 Unspecified osteoarthritis, unspecified site: Secondary | ICD-10-CM | POA: Diagnosis not present

## 2018-06-03 DIAGNOSIS — M797 Fibromyalgia: Secondary | ICD-10-CM | POA: Diagnosis not present

## 2018-06-03 DIAGNOSIS — M545 Low back pain: Secondary | ICD-10-CM | POA: Diagnosis not present

## 2018-06-03 DIAGNOSIS — I2510 Atherosclerotic heart disease of native coronary artery without angina pectoris: Secondary | ICD-10-CM | POA: Diagnosis not present

## 2018-06-03 DIAGNOSIS — I509 Heart failure, unspecified: Secondary | ICD-10-CM | POA: Diagnosis not present

## 2018-06-04 DIAGNOSIS — M5416 Radiculopathy, lumbar region: Secondary | ICD-10-CM | POA: Diagnosis not present

## 2018-06-04 DIAGNOSIS — M5126 Other intervertebral disc displacement, lumbar region: Secondary | ICD-10-CM | POA: Diagnosis not present

## 2018-06-04 DIAGNOSIS — M545 Low back pain: Secondary | ICD-10-CM | POA: Diagnosis not present

## 2018-06-04 DIAGNOSIS — M4306 Spondylolysis, lumbar region: Secondary | ICD-10-CM | POA: Diagnosis not present

## 2018-06-06 DIAGNOSIS — I110 Hypertensive heart disease with heart failure: Secondary | ICD-10-CM | POA: Diagnosis not present

## 2018-06-06 DIAGNOSIS — M1990 Unspecified osteoarthritis, unspecified site: Secondary | ICD-10-CM | POA: Diagnosis not present

## 2018-06-06 DIAGNOSIS — M545 Low back pain: Secondary | ICD-10-CM | POA: Diagnosis not present

## 2018-06-06 DIAGNOSIS — M797 Fibromyalgia: Secondary | ICD-10-CM | POA: Diagnosis not present

## 2018-06-06 DIAGNOSIS — I509 Heart failure, unspecified: Secondary | ICD-10-CM | POA: Diagnosis not present

## 2018-06-06 DIAGNOSIS — I2510 Atherosclerotic heart disease of native coronary artery without angina pectoris: Secondary | ICD-10-CM | POA: Diagnosis not present

## 2018-06-09 DIAGNOSIS — M1990 Unspecified osteoarthritis, unspecified site: Secondary | ICD-10-CM | POA: Diagnosis not present

## 2018-06-09 DIAGNOSIS — I2510 Atherosclerotic heart disease of native coronary artery without angina pectoris: Secondary | ICD-10-CM | POA: Diagnosis not present

## 2018-06-09 DIAGNOSIS — I110 Hypertensive heart disease with heart failure: Secondary | ICD-10-CM | POA: Diagnosis not present

## 2018-06-09 DIAGNOSIS — M797 Fibromyalgia: Secondary | ICD-10-CM | POA: Diagnosis not present

## 2018-06-09 DIAGNOSIS — I509 Heart failure, unspecified: Secondary | ICD-10-CM | POA: Diagnosis not present

## 2018-06-09 DIAGNOSIS — M545 Low back pain: Secondary | ICD-10-CM | POA: Diagnosis not present

## 2018-06-13 DIAGNOSIS — M1990 Unspecified osteoarthritis, unspecified site: Secondary | ICD-10-CM | POA: Diagnosis not present

## 2018-06-13 DIAGNOSIS — I2510 Atherosclerotic heart disease of native coronary artery without angina pectoris: Secondary | ICD-10-CM | POA: Diagnosis not present

## 2018-06-13 DIAGNOSIS — M545 Low back pain: Secondary | ICD-10-CM | POA: Diagnosis not present

## 2018-06-13 DIAGNOSIS — M797 Fibromyalgia: Secondary | ICD-10-CM | POA: Diagnosis not present

## 2018-06-13 DIAGNOSIS — I110 Hypertensive heart disease with heart failure: Secondary | ICD-10-CM | POA: Diagnosis not present

## 2018-06-13 DIAGNOSIS — I509 Heart failure, unspecified: Secondary | ICD-10-CM | POA: Diagnosis not present

## 2018-06-17 DIAGNOSIS — M48062 Spinal stenosis, lumbar region with neurogenic claudication: Secondary | ICD-10-CM | POA: Diagnosis not present

## 2018-06-17 DIAGNOSIS — M4316 Spondylolisthesis, lumbar region: Secondary | ICD-10-CM | POA: Diagnosis not present

## 2018-06-18 DIAGNOSIS — I509 Heart failure, unspecified: Secondary | ICD-10-CM | POA: Diagnosis not present

## 2018-06-18 DIAGNOSIS — I2510 Atherosclerotic heart disease of native coronary artery without angina pectoris: Secondary | ICD-10-CM | POA: Diagnosis not present

## 2018-06-18 DIAGNOSIS — M545 Low back pain: Secondary | ICD-10-CM | POA: Diagnosis not present

## 2018-06-18 DIAGNOSIS — M1990 Unspecified osteoarthritis, unspecified site: Secondary | ICD-10-CM | POA: Diagnosis not present

## 2018-06-18 DIAGNOSIS — M797 Fibromyalgia: Secondary | ICD-10-CM | POA: Diagnosis not present

## 2018-06-18 DIAGNOSIS — I110 Hypertensive heart disease with heart failure: Secondary | ICD-10-CM | POA: Diagnosis not present

## 2018-06-23 DIAGNOSIS — M47816 Spondylosis without myelopathy or radiculopathy, lumbar region: Secondary | ICD-10-CM | POA: Diagnosis not present

## 2018-06-27 DIAGNOSIS — I110 Hypertensive heart disease with heart failure: Secondary | ICD-10-CM | POA: Diagnosis not present

## 2018-06-27 DIAGNOSIS — I509 Heart failure, unspecified: Secondary | ICD-10-CM | POA: Diagnosis not present

## 2018-06-27 DIAGNOSIS — M1990 Unspecified osteoarthritis, unspecified site: Secondary | ICD-10-CM | POA: Diagnosis not present

## 2018-06-27 DIAGNOSIS — M545 Low back pain: Secondary | ICD-10-CM | POA: Diagnosis not present

## 2018-06-27 DIAGNOSIS — I2510 Atherosclerotic heart disease of native coronary artery without angina pectoris: Secondary | ICD-10-CM | POA: Diagnosis not present

## 2018-06-27 DIAGNOSIS — M797 Fibromyalgia: Secondary | ICD-10-CM | POA: Diagnosis not present

## 2018-06-28 DIAGNOSIS — M797 Fibromyalgia: Secondary | ICD-10-CM | POA: Diagnosis not present

## 2018-06-28 DIAGNOSIS — M1990 Unspecified osteoarthritis, unspecified site: Secondary | ICD-10-CM | POA: Diagnosis not present

## 2018-06-28 DIAGNOSIS — I2510 Atherosclerotic heart disease of native coronary artery without angina pectoris: Secondary | ICD-10-CM | POA: Diagnosis not present

## 2018-06-28 DIAGNOSIS — M545 Low back pain: Secondary | ICD-10-CM | POA: Diagnosis not present

## 2018-06-28 DIAGNOSIS — I509 Heart failure, unspecified: Secondary | ICD-10-CM | POA: Diagnosis not present

## 2018-06-28 DIAGNOSIS — I110 Hypertensive heart disease with heart failure: Secondary | ICD-10-CM | POA: Diagnosis not present

## 2018-07-01 DIAGNOSIS — I2510 Atherosclerotic heart disease of native coronary artery without angina pectoris: Secondary | ICD-10-CM | POA: Diagnosis not present

## 2018-07-01 DIAGNOSIS — M797 Fibromyalgia: Secondary | ICD-10-CM | POA: Diagnosis not present

## 2018-07-01 DIAGNOSIS — M545 Low back pain: Secondary | ICD-10-CM | POA: Diagnosis not present

## 2018-07-01 DIAGNOSIS — I110 Hypertensive heart disease with heart failure: Secondary | ICD-10-CM | POA: Diagnosis not present

## 2018-07-01 DIAGNOSIS — I509 Heart failure, unspecified: Secondary | ICD-10-CM | POA: Diagnosis not present

## 2018-07-01 DIAGNOSIS — M1990 Unspecified osteoarthritis, unspecified site: Secondary | ICD-10-CM | POA: Diagnosis not present

## 2018-07-05 DIAGNOSIS — I509 Heart failure, unspecified: Secondary | ICD-10-CM | POA: Diagnosis not present

## 2018-07-05 DIAGNOSIS — M797 Fibromyalgia: Secondary | ICD-10-CM | POA: Diagnosis not present

## 2018-07-05 DIAGNOSIS — M1990 Unspecified osteoarthritis, unspecified site: Secondary | ICD-10-CM | POA: Diagnosis not present

## 2018-07-05 DIAGNOSIS — I110 Hypertensive heart disease with heart failure: Secondary | ICD-10-CM | POA: Diagnosis not present

## 2018-07-05 DIAGNOSIS — I2510 Atherosclerotic heart disease of native coronary artery without angina pectoris: Secondary | ICD-10-CM | POA: Diagnosis not present

## 2018-07-05 DIAGNOSIS — M545 Low back pain: Secondary | ICD-10-CM | POA: Diagnosis not present

## 2018-07-09 DIAGNOSIS — I509 Heart failure, unspecified: Secondary | ICD-10-CM | POA: Diagnosis not present

## 2018-07-09 DIAGNOSIS — I110 Hypertensive heart disease with heart failure: Secondary | ICD-10-CM | POA: Diagnosis not present

## 2018-07-09 DIAGNOSIS — M797 Fibromyalgia: Secondary | ICD-10-CM | POA: Diagnosis not present

## 2018-07-09 DIAGNOSIS — I2510 Atherosclerotic heart disease of native coronary artery without angina pectoris: Secondary | ICD-10-CM | POA: Diagnosis not present

## 2018-07-09 DIAGNOSIS — M545 Low back pain: Secondary | ICD-10-CM | POA: Diagnosis not present

## 2018-07-09 DIAGNOSIS — M1990 Unspecified osteoarthritis, unspecified site: Secondary | ICD-10-CM | POA: Diagnosis not present

## 2018-07-10 DIAGNOSIS — I2510 Atherosclerotic heart disease of native coronary artery without angina pectoris: Secondary | ICD-10-CM | POA: Diagnosis not present

## 2018-07-10 DIAGNOSIS — M797 Fibromyalgia: Secondary | ICD-10-CM | POA: Diagnosis not present

## 2018-07-10 DIAGNOSIS — M1990 Unspecified osteoarthritis, unspecified site: Secondary | ICD-10-CM | POA: Diagnosis not present

## 2018-07-10 DIAGNOSIS — M545 Low back pain: Secondary | ICD-10-CM | POA: Diagnosis not present

## 2018-07-10 DIAGNOSIS — I509 Heart failure, unspecified: Secondary | ICD-10-CM | POA: Diagnosis not present

## 2018-07-10 DIAGNOSIS — I110 Hypertensive heart disease with heart failure: Secondary | ICD-10-CM | POA: Diagnosis not present

## 2018-07-18 ENCOUNTER — Other Ambulatory Visit: Payer: Self-pay | Admitting: Cardiovascular Disease

## 2018-07-28 DIAGNOSIS — M4316 Spondylolisthesis, lumbar region: Secondary | ICD-10-CM | POA: Diagnosis not present

## 2018-07-28 DIAGNOSIS — M48062 Spinal stenosis, lumbar region with neurogenic claudication: Secondary | ICD-10-CM | POA: Diagnosis not present

## 2018-07-28 DIAGNOSIS — Z6841 Body Mass Index (BMI) 40.0 and over, adult: Secondary | ICD-10-CM | POA: Diagnosis not present

## 2018-08-20 DIAGNOSIS — E039 Hypothyroidism, unspecified: Secondary | ICD-10-CM | POA: Diagnosis not present

## 2019-01-15 ENCOUNTER — Other Ambulatory Visit: Payer: Self-pay | Admitting: Cardiovascular Disease

## 2019-01-15 NOTE — Telephone Encounter (Signed)
Called spoke with pt, she states she has already received this refill from her cardiologist in [place].  She is unsure why we received request for refill, but we can disregard per pt.  Will send denial to pharmacy/ no longer under prescriber care.

## 2020-12-11 ENCOUNTER — Emergency Department (HOSPITAL_COMMUNITY): Payer: Medicare Other

## 2020-12-11 ENCOUNTER — Encounter (HOSPITAL_COMMUNITY): Payer: Self-pay | Admitting: Emergency Medicine

## 2020-12-11 ENCOUNTER — Emergency Department (HOSPITAL_COMMUNITY)
Admission: EM | Admit: 2020-12-11 | Discharge: 2020-12-11 | Disposition: A | Discharge status: Home / Self Care | Payer: Medicare Other | Attending: Emergency Medicine | Admitting: Emergency Medicine

## 2020-12-11 DIAGNOSIS — I4891 Unspecified atrial fibrillation: Secondary | ICD-10-CM | POA: Diagnosis not present

## 2020-12-11 DIAGNOSIS — U071 COVID-19: Secondary | ICD-10-CM

## 2020-12-11 DIAGNOSIS — Z7901 Long term (current) use of anticoagulants: Secondary | ICD-10-CM | POA: Diagnosis not present

## 2020-12-11 DIAGNOSIS — Z79899 Other long term (current) drug therapy: Secondary | ICD-10-CM | POA: Insufficient documentation

## 2020-12-11 DIAGNOSIS — Z951 Presence of aortocoronary bypass graft: Secondary | ICD-10-CM | POA: Diagnosis not present

## 2020-12-11 DIAGNOSIS — Z87891 Personal history of nicotine dependence: Secondary | ICD-10-CM | POA: Insufficient documentation

## 2020-12-11 DIAGNOSIS — I5032 Chronic diastolic (congestive) heart failure: Secondary | ICD-10-CM | POA: Diagnosis not present

## 2020-12-11 DIAGNOSIS — I110 Hypertensive heart disease with heart failure: Secondary | ICD-10-CM | POA: Insufficient documentation

## 2020-12-11 DIAGNOSIS — R059 Cough, unspecified: Secondary | ICD-10-CM | POA: Diagnosis present

## 2020-12-11 DIAGNOSIS — Z91040 Latex allergy status: Secondary | ICD-10-CM | POA: Insufficient documentation

## 2020-12-11 DIAGNOSIS — I509 Heart failure, unspecified: Secondary | ICD-10-CM

## 2020-12-11 DIAGNOSIS — Z7902 Long term (current) use of antithrombotics/antiplatelets: Secondary | ICD-10-CM | POA: Diagnosis not present

## 2020-12-11 DIAGNOSIS — I2510 Atherosclerotic heart disease of native coronary artery without angina pectoris: Secondary | ICD-10-CM | POA: Insufficient documentation

## 2020-12-11 LAB — CBC WITH DIFFERENTIAL/PLATELET
Abs Immature Granulocytes: 0.04 10*3/uL (ref 0.00–0.07)
Basophils Absolute: 0 10*3/uL (ref 0.0–0.1)
Basophils Relative: 1 %
Eosinophils Absolute: 0.1 10*3/uL (ref 0.0–0.5)
Eosinophils Relative: 1 %
HCT: 30.9 % — ABNORMAL LOW (ref 36.0–46.0)
Hemoglobin: 9.5 g/dL — ABNORMAL LOW (ref 12.0–15.0)
Immature Granulocytes: 1 %
Lymphocytes Relative: 11 %
Lymphs Abs: 0.7 10*3/uL (ref 0.7–4.0)
MCH: 28.3 pg (ref 26.0–34.0)
MCHC: 30.7 g/dL (ref 30.0–36.0)
MCV: 92 fL (ref 80.0–100.0)
Monocytes Absolute: 0.8 10*3/uL (ref 0.1–1.0)
Monocytes Relative: 13 %
Neutro Abs: 4.7 10*3/uL (ref 1.7–7.7)
Neutrophils Relative %: 73 %
Platelets: 191 10*3/uL (ref 150–400)
RBC: 3.36 MIL/uL — ABNORMAL LOW (ref 3.87–5.11)
RDW: 15.6 % — ABNORMAL HIGH (ref 11.5–15.5)
WBC: 6.4 10*3/uL (ref 4.0–10.5)
nRBC: 0 % (ref 0.0–0.2)

## 2020-12-11 LAB — URINALYSIS, ROUTINE W REFLEX MICROSCOPIC
Bilirubin Urine: NEGATIVE
Glucose, UA: 150 mg/dL — AB
Hgb urine dipstick: NEGATIVE
Ketones, ur: NEGATIVE mg/dL
Leukocytes,Ua: NEGATIVE
Nitrite: NEGATIVE
Protein, ur: NEGATIVE mg/dL
Specific Gravity, Urine: 1.009 (ref 1.005–1.030)
pH: 6 (ref 5.0–8.0)

## 2020-12-11 LAB — COMPREHENSIVE METABOLIC PANEL
ALT: 16 U/L (ref 0–44)
AST: 19 U/L (ref 15–41)
Albumin: 3.1 g/dL — ABNORMAL LOW (ref 3.5–5.0)
Alkaline Phosphatase: 89 U/L (ref 38–126)
Anion gap: 6 (ref 5–15)
BUN: 16 mg/dL (ref 8–23)
CO2: 25 mmol/L (ref 22–32)
Calcium: 9.3 mg/dL (ref 8.9–10.3)
Chloride: 107 mmol/L (ref 98–111)
Creatinine, Ser: 1.21 mg/dL — ABNORMAL HIGH (ref 0.44–1.00)
GFR, Estimated: 46 mL/min — ABNORMAL LOW (ref >60)
Glucose, Bld: 84 mg/dL (ref 70–99)
Potassium: 3.7 mmol/L (ref 3.5–5.1)
Sodium: 138 mmol/L (ref 135–145)
Total Bilirubin: 0.6 mg/dL (ref 0.3–1.2)
Total Protein: 7.1 g/dL (ref 6.5–8.1)

## 2020-12-11 LAB — RESP PANEL BY RT-PCR (FLU A&B, COVID) ARPGX2
Influenza A by PCR: NEGATIVE
Influenza B by PCR: NEGATIVE
SARS Coronavirus 2 by RT PCR: POSITIVE — AB

## 2020-12-11 LAB — BRAIN NATRIURETIC PEPTIDE: B Natriuretic Peptide: 386.1 pg/mL — ABNORMAL HIGH (ref 0.0–100.0)

## 2020-12-11 MED ORDER — DEXAMETHASONE SODIUM PHOSPHATE 10 MG/ML IJ SOLN
10.0000 mg | Freq: Once | INTRAMUSCULAR | Status: AC
  Administered 2020-12-11: 10 mg via INTRAVENOUS
  Filled 2020-12-11: qty 1

## 2020-12-11 MED ORDER — FUROSEMIDE 10 MG/ML IJ SOLN
40.0000 mg | Freq: Once | INTRAMUSCULAR | Status: AC
  Administered 2020-12-11: 40 mg via INTRAVENOUS
  Filled 2020-12-11: qty 4

## 2020-12-11 MED ORDER — ACETAMINOPHEN 325 MG PO TABS
650.0000 mg | ORAL_TABLET | Freq: Once | ORAL | Status: AC
  Administered 2020-12-11: 650 mg via ORAL
  Filled 2020-12-11: qty 2

## 2020-12-11 MED ORDER — MOLNUPIRAVIR EUA 200MG CAPSULE: 4.0000 | ORAL_CAPSULE | Freq: Two times a day (BID) | ORAL | 0 refills | Status: AC

## 2020-12-11 NOTE — ED Provider Notes (Signed)
[facility] EMERGENCY DEPARTMENT Provider Note   CSN: [id number] Arrival date & time: 12/11/20  1046     History No chief complaint on file.   SYNETTA GOGUE is a 80 y.o. female.  HPI    80yo female with history of aortic insufficiency with aortic stenosis, chronic diastolic CHF, CAD, hypertension, hyperlipidemia, fibromyalgia, atrial fibrillation on eliquis who presents with concern for cough, fatigue, shortness of breath for the last 2 days.    Coughing night before last Took some mucinex This AM, felt like a "train ran over me" this AM, more severe fatigue Some body aches Fever 3-4 wk of increasing leg swelling Has been traveling and not checking weights Thyroid testing  No sore throat, runny nose No chest pain No nausea, vomiting, diarrhea   Has had generalized weakness over 5 years In process of moving, staying with a friend for now but doesn't think she will stay in [place] Has been through a lot the last few years    Past Medical History:  Diagnosis Date   Anxiety    Aortic insufficiency with aortic stenosis    Echo 12/17: mean gradient 10; mod AI   Chronic diastolic CHF (congestive heart failure) ([place])    Echo 12/17: Mild concentric LVH, EF 65-70, normal wall motion, grade 2 diastolic dysfunction, mild aortic stenosis (mean 10, peak 18), moderate aortic insufficiency   Coronary artery disease    a. s/p MI 2009 - PCI/DES distal  RCA w/ 2.75 x 18 Xience DES;  b. 05/2010 Myoview: Apical thinning, EF 73%;  c. 06/2010 Cath: moderate nonobs dzs, EF 65%;  d.  Lexiscan Myoview (07/2013):  No scar or ischemia, EF 67%; Normal Study // e. s/p CABG 2017 // f. LHC in 6/19: Mid RCA 60-70; Patent dist RCA stent, patent L-LAD >> Med Rx   Environmental allergies    Essential iris atrophy    Right side   Fatigue    Fibromyalgia    GERD (gastroesophageal reflux disease)    Glaucoma    Hyperlipidemia    Hypertension    Iron deficiency anemia    Menopause     Obesity (BMI 30-39.9)    OSA (obstructive sleep apnea)    does not wear CPAP   Osteoarthritis    Oxygen dependent    3L    Patient Active Problem List   Diagnosis Date Noted   CHF (congestive heart failure) ([place]) 09/28/2017   Right arm pain 09/26/2016   Depression 04/29/2016   Chronic fatigue syndrome 04/29/2016   SOB (shortness of breath) 04/27/2016   Aneurysm of right radial artery ([place]) 11/01/2015   Anxiety state 10/03/2015   CAD (coronary artery disease) 09/23/2015   Angina pectoris ([place])    Atrial fibrillation ([place]) 12/19/2014   Multiple thyroid nodules 01/12/2014   OSA (obstructive sleep apnea)    Obesity (BMI 30-39.9)    Sleep apnea 06/22/2013   Fibromyalgia    Coronary artery disease    Hypertension    Hyperlipidemia    Fatigue    History of myocardial infarction     Past Surgical History:  Procedure Laterality Date   CARDIAC CATHETERIZATION  06/29/2010   Mild to moderate coronary artery irregularities.  Her  proximal left anterior descending artery is moderately narrowed.  She  also has an eccentric stenosis in the proximal LAD.  These do not appear  to obstruct flow at present, but they are fairly small vessels.  They  appeared to be between 1.5  and 2 mm in diamete   CARDIAC CATHETERIZATION  08/02/2009   Patent stent   CARDIAC CATHETERIZATION  05/12/2008   Stent to the distal RCA   CARDIAC CATHETERIZATION N/A 09/20/2015   Procedure: Left Heart Cath and Coronary Angiography;  Surgeon: Jettie Booze, MD;  Location: [facility];  Service: Cardiovascular;  Laterality: N/A;   CARDIAC CATHETERIZATION  09/20/2015   Procedure: Intravascular Ultrasound/IVUS;  Surgeon: Jettie Booze, MD;  Location: [facility];  Service: Cardiovascular;;   CARDIAC CATHETERIZATION  09/20/2015   Procedure: Intravascular Pressure Wire/FFR Study;  Surgeon: Jettie Booze, MD;  Location: [facility];  Service: Cardiovascular;;   CLIPPING OF ATRIAL APPENDAGE N/A  09/23/2015   Procedure:  CLIPPING OF ATRIAL APPENDAGE;  Surgeon: Grace Isaac, MD;  Location: [facility];  Service: Open Heart Surgery;  Laterality: N/A;   CORONARY ARTERY BYPASS GRAFT N/A 09/23/2015   Procedure: CORONARY ARTERY BYPASS GRAFTING (CABG) TIMES 1 USING LEFT INTERNAL MAMMARY TO THE LAD;  Surgeon: Grace Isaac, MD;  Location: [facility];  Service: Open Heart Surgery;  Laterality: N/A;   LEFT HEART CATH AND CORS/GRAFTS ANGIOGRAPHY N/A 11/12/2017   Procedure: LEFT HEART CATH AND CORS/GRAFTS ANGIOGRAPHY;  Surgeon: Belva Crome, MD;  Location: [facility];  Service: Cardiovascular;  Laterality: N/A;   MAZE N/A 09/23/2015   Procedure: MAZE;  Surgeon: Grace Isaac, MD;  Location: [facility];  Service: Open Heart Surgery;  Laterality: N/A;   RESECTION OF ARTERIOVENOUS FISTULA ANEURYSM Right 10/21/2015   Procedure: SUTURE REPAIR OF RADIAL ARTERY AND EVACUATION OF HEMATOMA;  Surgeon: Mal Misty, MD;  Location: [facility];  Service: Vascular;  Laterality: Right;   TEE WITHOUT CARDIOVERSION N/A 09/23/2015   Procedure: TRANSESOPHAGEAL ECHOCARDIOGRAM (TEE);  Surgeon: Grace Isaac, MD;  Location: [facility];  Service: Open Heart Surgery;  Laterality: N/A;     OB History   No obstetric history on file.     Family History  Problem Relation Age of Onset   Heart attack Mother    Cancer Father    Coronary artery disease Brother        had CABG   Coronary artery disease Brother    Coronary artery disease Brother    Coronary artery disease Brother    Coronary artery disease Brother    Heart attack Brother    Breast cancer Sister     Social History   Tobacco Use   Smoking status: Former    Types: Cigarettes    Quit date: 09/13/1960    Years since quitting: 60.2   Smokeless tobacco: Never  Substance Use Topics   Alcohol use: No   Drug use: No    Home Medications Prior to Admission medications   Medication Sig Start Date End Date Taking? Authorizing Provider  acetaminophen (TYLENOL)  500 MG tablet Take 1,000 mg by mouth every 6 (six) hours as needed (for pain).    Yes [provider]  albuterol (VENTOLIN HFA) 108 (90 Base) MCG/ACT inhaler Inhale 2 puffs into the lungs every 6 (six) hours.   Yes [provider]  amLODipine (NORVASC) 5 MG tablet Take 5 mg by mouth daily.   Yes [provider]  atorvastatin (LIPITOR) 80 MG tablet Take 80 mg by mouth daily. 11/23/20  Yes [provider]  azelastine (ASTELIN) 0.1 % nasal spray Place 2 sprays into both nostrils 2 (two) times daily.   Yes [provider]  cholecalciferol (  VITAMIN D) 25 MCG (1000 UNIT) tablet Take 2,000 Units by mouth daily.   Yes [provider]  cloNIDine (CATAPRES) 0.1 MG tablet Take 0.1 mg by mouth every 8 (eight) hours as needed (for hypertension if BP > 180). 10/25/20  Yes [provider]  clopidogrel (PLAVIX) 75 MG tablet Take 75 mg by mouth daily. 07/24/20  Yes [provider]  Dextromethorphan-guaiFENesin ([place] FAST-MAX DM MAX PO) Take 20 mLs by mouth every 4 (four) hours.   Yes [provider]  ELIQUIS 5 MG TABS tablet TAKE 1 TABLET(5 MG) BY MOUTH TWICE DAILY Patient taking differently: Take 5 mg by mouth 2 (two) times daily. 07/18/18  Yes Nahser, Wonda Cheng, MD  FARXIGA 5 MG TABS tablet Take 5 mg by mouth daily. 12/02/20  Yes [provider]  ferrous sulfate 325 (65 FE) MG tablet Take 1 tablet (325 mg total) by mouth 2 (two) times daily with a meal. 09/30/17  Yes Thurnell Lose, MD  furosemide (LASIX) 40 MG tablet Take 1 tablet (40 mg total) by mouth 2 (two) times daily. Patient taking differently: Take 40 mg by mouth daily. 09/30/17  Yes Thurnell Lose, MD  levothyroxine (SYNTHROID) 137 MCG tablet Take 137 mcg by mouth daily before breakfast.   Yes [provider]  liothyronine (CYTOMEL) 5 MCG tablet Take 5 mcg by mouth daily. 10/13/20  Yes [provider]  lisinopril (ZESTRIL) 40 MG tablet Take 40 mg by  mouth daily. 11/29/20  Yes [provider]  Magnesium 250 MG TABS Take 250 mg by mouth daily.   Yes [provider]  methylPREDNISolone (MEDROL DOSEPAK) 4 MG TBPK tablet Take 4 mg by mouth as directed.   Yes [provider]  molnupiravir EUA 200 mg CAPS Take 4 capsules (800 mg total) by mouth 2 (two) times daily for 5 days. 12/11/20 12/16/20 Yes Gareth Morgan, MD  nitroGLYCERIN (NITROSTAT) 0.4 MG SL tablet Place 1 tablet (0.4 mg total) under the tongue every 5 (five) minutes as needed for chest pain. 10/31/17  Yes Nahser, Wonda Cheng, MD  pantoprazole (PROTONIX) 40 MG tablet Take 40 mg by mouth daily. 01/07/14  Yes [provider]  pramipexole (MIRAPEX) 1 MG tablet Take 1 mg by mouth 3 (three) times daily.   Yes [provider]  venlafaxine XR (EFFEXOR-XR) 150 MG 24 hr capsule Take 150 mg by mouth in the morning and at bedtime.   Yes [provider]  vitamin B-12 (CYANOCOBALAMIN) 100 MCG tablet Take 100 mcg by mouth daily.   Yes [provider]  zinc gluconate 50 MG tablet Take 50 mg by mouth daily.   Yes [provider]  levothyroxine (SYNTHROID, LEVOTHROID) 75 MCG tablet TAKE 1 TABLET(75 MCG) BY MOUTH DAILY BEFORE BREAKFAST Patient not taking: Reported on 12/11/2020 12/16/17   Tereasa Coop, PA-C  metoprolol tartrate (LOPRESSOR) 25 MG tablet TAKE 1 TABLET(25 MG) BY MOUTH TWICE DAILY Patient not taking: Reported on 12/11/2020 02/21/18   Nahser, Wonda Cheng, MD  Potassium Chloride ER 20 MEQ TBCR Take 20 mEq by mouth daily. Patient not taking: Reported on 12/11/2020 12/18/17   [name] T, PA-C  rosuvastatin (CRESTOR) 5 MG tablet Take 1 tablet (5 mg total) daily by mouth. 03/27/17 12/18/17  Nahser, Wonda Cheng, MD  traMADol (ULTRAM) 50 MG tablet Take one tablet by mouth every 4 hours as needed for moderate pain; Take two tablets by mouth every 4 hours as needed for severe pain Patient not taking: Reported  on 12/11/2020 10/06/15   Hollace Kinnier L, DO    Allergies    Other, Sulfamethoxazole-trimethoprim, Cortisone, Latex, and Tape  Review of Systems   Review of Systems  Constitutional:  Positive for appetite change, fatigue and fever.  HENT:  Negative for congestion and sore throat.   Eyes:  Negative for visual disturbance.  Respiratory:  Positive for cough and shortness of breath.   Cardiovascular:  Positive for leg swelling. Negative for chest pain.  Gastrointestinal:  Negative for abdominal pain, diarrhea, nausea and vomiting.  Musculoskeletal:  Negative for back pain.  Skin:  Negative for rash.  Neurological:  Negative for headaches.   Physical Exam Updated Vital Signs BP (!) 167/48   Pulse 89   Temp 98.4 F (36.9 C) (Oral)   Resp (!) 22   SpO2 96%   Physical Exam Vitals and nursing note reviewed.  Constitutional:      General: She is not in acute distress.    Appearance: She is well-developed. She is not diaphoretic.  HENT:     Head: Normocephalic and atraumatic.  Eyes:     Conjunctiva/sclera: Conjunctivae normal.  Cardiovascular:     Rate and Rhythm: Normal rate and regular rhythm.     Heart sounds: Normal heart sounds. No murmur heard.   No friction rub. No gallop.  Pulmonary:     Effort: Pulmonary effort is normal. No respiratory distress.     Breath sounds: Wheezing present. No rales.  Abdominal:     General: There is no distension.     Palpations: Abdomen is soft.     Tenderness: There is no abdominal tenderness. There is no guarding.  Musculoskeletal:        General: No tenderness.     Cervical back: Normal range of motion.  Skin:    General: Skin is warm and dry.     Findings: No erythema or rash.  Neurological:     Mental Status: She is alert and oriented to person, place, and time.    ED Results / Procedures / Treatments   Labs (all labs ordered are listed, but only abnormal results are displayed) Labs Reviewed  RESP PANEL BY RT-PCR (FLU A&B, COVID) ARPGX2 - Abnormal; Notable  for the following components:      Result Value   SARS Coronavirus 2 by RT PCR POSITIVE (*)    All other components within normal limits  CBC WITH DIFFERENTIAL/PLATELET - Abnormal; Notable for the following components:   RBC 3.36 (*)    Hemoglobin 9.5 (*)    HCT 30.9 (*)    RDW 15.6 (*)    All other components within normal limits  COMPREHENSIVE METABOLIC PANEL - Abnormal; Notable for the following components:   Creatinine, Ser 1.21 (*)    Albumin 3.1 (*)    GFR, Estimated 46 (*)    All other components within normal limits  URINALYSIS, ROUTINE W REFLEX MICROSCOPIC - Abnormal; Notable for the following components:   Color, Urine STRAW (*)    Glucose, UA 150 (*)    All other components within normal limits  BRAIN NATRIURETIC PEPTIDE - Abnormal; Notable for the following components:   B Natriuretic Peptide 386.1 (*)    All other components within normal limits    EKG EKG Interpretation  Date/Time:  Sunday December 11 2020 10:56:20 EDT Ventricular Rate:  80 PR Interval:  172 QRS Duration: 84 QT Interval:  368 QTC Calculation: 424 R Axis:   85 Text Interpretation: Normal sinus rhythm  Nonspecific ST abnormality Abnormal ECG No significant change since last tracing Confirmed by Gareth Morgan [phone]) on 12/11/2020 5:07:53 PM  Radiology DG Chest Portable 1 View  Result Date: 12/11/2020 CLINICAL DATA:  Shortness of breath.  Question COVID-19. EXAM: PORTABLE CHEST 1 VIEW COMPARISON:  November 14, 2017 FINDINGS: Stable cardiomegaly. The hila and mediastinum are unremarkable. No pneumothorax. No nodules or masses. No focal infiltrates. Increased interstitial markings in the lungs bilaterally. No other acute abnormalities. IMPRESSION: 1. Cardiomegaly. 2. Increased interstitial markings in the setting of cardiomegaly suggest pulmonary venous congestion or mild edema. Electronically Signed   By: Dorise Bullion III M.D   On: 12/11/2020 15:26    Procedures Procedures   Medications Ordered in  ED Medications  acetaminophen (TYLENOL) tablet 650 mg (650 mg Oral Given 12/11/20 1101)  furosemide (LASIX) injection 40 mg (40 mg Intravenous Given 12/11/20 1625)  dexamethasone (DECADRON) injection 10 mg (10 mg Intravenous Given 12/11/20 1627)    ED Course  I have reviewed the triage vital signs and the nursing notes.  Pertinent labs & imaging results that were available during my care of the patient were reviewed by me and considered in my medical decision making (see chart for details).    MDM Rules/Calculators/A&P                             80yo female with history of aortic insufficiency with aortic stenosis, chronic diastolic CHF, CAD, hypertension, hyperlipidemia, fibromyalgia, atrial fibrillation on eliquis who presents with concern for cough, fatigue, shortness of breath for the last 2 days.    Chest x-ray shows cardiomegaly, increased interstitial markings in the setting of cardiomegaly suggesting pulmonary venous congestion and mild edema.  She does have lower extremity edema which has been worsening and history of congestive heart failure, suspect some degree of volume overload.  BNP 386. Given IV Lasix with good urine output, and she reports improvement of her breathing.  EKG shows no significant abnormalities, no chest pain.  Urinalysis without signs of infection.  Anemia is stable as well as creatinine.  COVID 19 testing positive. Suspect COVID 19 and volume overload contributing to symptoms, and has some wheezing with hx of asthma per pt and given dose of decadron.   Vital signs remain stable, no tachycardia, no hypotension, no hypoxia. Able to ambulate without desaturation or respiratory distress. Stable for outpatient treatment of COVID and CHF.  Diuresed in ED< may increase lasix dose for one day.  Given rx for molnupiravir. Patient discharged in stable condition with understanding of reasons to return.   Final Clinical Impression(s) / ED Diagnoses Final diagnoses:   COVID-19  Acute congestive heart failure, unspecified heart failure type [facility])    Rx / DC Orders ED Discharge Orders          Ordered    molnupiravir EUA 200 mg CAPS  2 times daily        12/11/20 2103             Gareth Morgan, MD 12/12/20 [phone]

## 2020-12-11 NOTE — ED Notes (Signed)
Pt in room talking on the phone. Linen changed, pure-wic applied. Pt given sandwich and drink. Updated pt on plan of care.

## 2020-12-11 NOTE — ED Notes (Addendum)
Pt ambulated in room. Denies SOB with ambulation. Pt usually has a walker. O2 sat stayed between 94-98% on room air.

## 2020-12-11 NOTE — ED Triage Notes (Signed)
Pt to triage via [facility] from home.  Reports fatigue, cough, SOB since yesterday.  Pt in the process of moving here from [place].  Reports feverish and feeling achy today.

## 2020-12-11 NOTE — ED Provider Notes (Signed)
Emergency Medicine Provider Triage Evaluation Note  [name] , a 80 y.o. female  was evaluated in triage.  Pt complains of generalized weakness, cough, shortness of breath.  No known sick contacts.  Vaccinated but not boosted.  No chest pain.  Feels poor  Review of Systems  Positive: Cough, sob, weakness Negative: cp  Physical Exam  BP (!) 164/54   Pulse 82   Temp (!) 100.6 F (38.1 C) (Oral)   Resp 18   SpO2 97%  Gen:   Awake, no distress   Resp:  Normal effort. Dry cough noted  MSK:   Moves extremities without difficulty.  Mild bilateral leg swelling Other:  Feels febrile   Medical Decision Making  Medically screening exam initiated at 10:49 AM.  Appropriate orders placed.  [name] was informed that the remainder of the evaluation will be completed by another provider, this initial triage assessment does not replace that evaluation, and the importance of remaining in the ED until their evaluation is complete.  Labs, covid, cxr   [place], PA-C 12/11/20 1106    [place], DO 12/11/20 1233
